# Patient Record
Sex: Male | Born: 1980 | Race: White | Hispanic: No | Marital: Married | Smoking: Current some day smoker
Health system: Southern US, Community
[De-identification: ages and names within clinical notes are randomized; demographics above are authoritative.]

## PROBLEM LIST (undated history)

## (undated) DIAGNOSIS — F419 Anxiety disorder, unspecified: Secondary | ICD-10-CM

## (undated) HISTORY — DX: Anxiety disorder, unspecified: F41.9

## (undated) HISTORY — PX: CARDIAC SURGERY: SHX584

---

## 2006-03-20 DIAGNOSIS — Z952 Presence of prosthetic heart valve: Secondary | ICD-10-CM

## 2006-03-20 HISTORY — DX: Presence of prosthetic heart valve: Z95.2

## 2019-01-06 ENCOUNTER — Ambulatory Visit: Payer: BC Managed Care – PPO | Admitting: Adult Health

## 2019-03-26 ENCOUNTER — Other Ambulatory Visit: Payer: Self-pay

## 2019-03-26 ENCOUNTER — Ambulatory Visit (INDEPENDENT_AMBULATORY_CARE_PROVIDER_SITE_OTHER): Payer: BC Managed Care – PPO | Admitting: Adult Health

## 2019-03-26 ENCOUNTER — Encounter: Payer: Self-pay | Admitting: Adult Health

## 2019-03-26 DIAGNOSIS — F422 Mixed obsessional thoughts and acts: Secondary | ICD-10-CM

## 2019-03-26 DIAGNOSIS — F331 Major depressive disorder, recurrent, moderate: Secondary | ICD-10-CM | POA: Diagnosis not present

## 2019-03-26 DIAGNOSIS — F410 Panic disorder [episodic paroxysmal anxiety] without agoraphobia: Secondary | ICD-10-CM | POA: Diagnosis not present

## 2019-03-26 DIAGNOSIS — F411 Generalized anxiety disorder: Secondary | ICD-10-CM | POA: Diagnosis not present

## 2019-03-26 MED ORDER — ALPRAZOLAM 1 MG PO TABS: 1.0000 mg | ORAL_TABLET | Freq: Three times a day (TID) | ORAL | 1 refills | Status: DC | PRN

## 2019-03-26 NOTE — Progress Notes (Signed)
[facility] MD/PA/NP Initial Note  03/26/2019 5:35 PM Chase Lee  MRN:  [id number]  Chief Complaint:  Chief Complaint    Anxiety; Depression; Panic Attack; Insomnia     HPI:  Describes mood today as "ok". Pleasant. Mood symptoms - reports some depression, anxiety, and irritability. Stating "I've had a lot of changes over the past few months". He and wife moved to [facility] from [place] with his job. It took wife 4 months to find a job. Increased worry and ruminations - "Lexapro has helped". Difficulties tolerating other SSRI's. Taking Lexapro 45m at bedtime. Has been taking Xanax since his twenties after 2 heart surgeries at age [age]and 89- "it seems to work well for me". Looking for a PCP and cardiololgist to manage his heart medications. Stable interest and motivation. Taking medications as prescribed.  Energy levels stable, Active, does not have a regular exercise routine. Works full-time. Enjoys some usual interests and activities. Married. Lives with wife of 7 years. Wife's family local. [place] time with family. Appetite adequate. Weight gain - 15 to 20 pounds since Covid-19.  Sleeps well most nights. Averages 6 to 8 hours. Focus and concentration stable. Completing tasks. Managing aspects of household. Work going well. Denies SI or HI. Denies AH or VH.  Previous medications: Prozac, Zoloft  Visit Diagnosis:    ICD-10-CM   1. Generalized anxiety disorder  F41.1 ALPRAZolam (XANAX) 1 MG tablet  2. Panic attacks  F41.0 ALPRAZolam (XANAX) 1 MG tablet  3. Major depressive disorder, recurrent episode, moderate (HCC)  F33.1   4. Mixed obsessional thoughts and acts  F42.2 ALPRAZolam (XANAX) 1 MG tablet    Past Psychiatric History: Denies psychiatric hospitalization.  Past Medical History:  Past Medical History:  Diagnosis Date  . Anxiety     Past Surgical History:  Procedure Laterality Date  . CARDIAC SURGERY      Family Psychiatric History: Mother with  depression.   Family History:  Family History  Problem Relation Age of Onset  . Depression Mother     Social History:  Social History   Socioeconomic History  . Marital status: Married    Spouse name: Not on file  . Number of children: 0  . Years of education: Not on file  . Highest education level: Not on file  Occupational History  . Occupation: SScientist, clinical (histocompatibility and immunogenetics) [employer]  Tobacco Use  . Smoking status: Current Some Day Smoker  . Smokeless tobacco: Never Used  Substance and Sexual Activity  . Alcohol use: Never  . Drug use: Never  . Sexual activity: Yes  Other Topics Concern  . Not on file  Social History Narrative  . Not on file   Social Determinants of Health   Financial Resource Strain:   . Difficulty of Paying Living Expenses: Not on file  Food Insecurity:   . Worried About RCharity fundraiserin the Last Year: Not on file  . Ran Out of Food in the Last Year: Not on file  Transportation Needs:   . Lack of Transportation (Medical): Not on file  . Lack of Transportation (Non-Medical): Not on file  Physical Activity:   . Days of Exercise per Week: Not on file  . Minutes of Exercise per Session: Not on file  Stress:   . Feeling of Stress : Not on file  Social Connections:   . Frequency of Communication with Friends and Family: Not on file  . Frequency of Social Gatherings  with Friends and Family: Not on file  . Attends Religious Services: Not on file  . Active Member of Clubs or Organizations: Not on file  . Attends Archivist Meetings: Not on file  . Marital Status: Not on file    Allergies: No Known Allergies  Metabolic Disorder Labs: No results found for: HGBA1C, MPG No results found for: PROLACTIN No results found for: CHOL, TRIG, HDL, CHOLHDL, VLDL, LDLCALC No results found for: TSH  Therapeutic Level Labs: No results found for: LITHIUM No results found for: VALPROATE No components found for:  CBMZ  Current Medications: Current  Outpatient Medications  Medication Sig Dispense Refill  . ALPRAZolam (XANAX) 1 MG tablet Take 1 tablet (1 mg total) by mouth 3 (three) times daily as needed for anxiety. 90 tablet 1  . furosemide (LASIX) 40 MG tablet TK 1 T PO D    . lisinopril (ZESTRIL) 20 MG tablet TK 1 T PO QD    . omeprazole (PRILOSEC) 40 MG capsule Take 40 mg by mouth daily.    Marland Kitchen warfarin (COUMADIN) 5 MG tablet      No current facility-administered medications for this visit.    Medication Side Effects: none  Orders placed this visit:  No orders of the defined types were placed in this encounter.   Psychiatric Specialty Exam:  Review of Systems  Musculoskeletal: Negative for gait problem.  Neurological: Negative for tremors.  Psychiatric/Behavioral:       Please refer to HPI    There were no vitals taken for this visit.There is no height or weight on file to calculate BMI.  General Appearance: Casual, Neat and Well Groomed  Eye Contact:  Good  Speech:  Clear and Coherent and Normal Rate  Volume:  Normal  Mood:  Anxious  Affect:  Appropriate and Congruent  Thought Process:  Coherent and Descriptions of Associations: Intact  Orientation:  Full (Time, Place, and Person)  Thought Content: Logical   Suicidal Thoughts:  No  Homicidal Thoughts:  No  Memory:  WNL  Judgement:  Good  Insight:  Good  Psychomotor Activity:  Normal  Concentration:  Concentration: Good  Recall:  Good  Fund of Knowledge: Good  Language: Good  Assets:  Communication Skills Desire for Improvement Financial Resources/Insurance Housing Intimacy Leisure Time Physical Health Resilience Social Support Talents/Skills Transportation Vocational/Educational  ADL's:  Intact  Cognition: WNL  Prognosis:  Good   Screenings: None  Receiving Psychotherapy: No   Treatment Plan/Recommendations:   Plan:  1. Lexapro 54m at hs 2. Continue Xanax 149mTID   RTC 4 weeks  Patient advised to contact office with any questions,  adverse effects, or acute worsening in signs and symptoms.    ReAloha GellNP

## 2019-04-17 ENCOUNTER — Other Ambulatory Visit: Payer: Self-pay

## 2019-04-17 ENCOUNTER — Encounter: Payer: Self-pay | Admitting: Adult Health

## 2019-04-17 ENCOUNTER — Ambulatory Visit (INDEPENDENT_AMBULATORY_CARE_PROVIDER_SITE_OTHER): Payer: BC Managed Care – PPO | Admitting: Adult Health

## 2019-04-17 DIAGNOSIS — F410 Panic disorder [episodic paroxysmal anxiety] without agoraphobia: Secondary | ICD-10-CM | POA: Diagnosis not present

## 2019-04-17 DIAGNOSIS — F422 Mixed obsessional thoughts and acts: Secondary | ICD-10-CM | POA: Diagnosis not present

## 2019-04-17 DIAGNOSIS — F411 Generalized anxiety disorder: Secondary | ICD-10-CM

## 2019-04-17 DIAGNOSIS — F331 Major depressive disorder, recurrent, moderate: Secondary | ICD-10-CM

## 2019-04-17 DIAGNOSIS — I10 Essential (primary) hypertension: Secondary | ICD-10-CM

## 2019-04-17 MED ORDER — ESCITALOPRAM OXALATE 10 MG PO TABS: 10.0000 mg | ORAL_TABLET | Freq: Every day | ORAL | 5 refills | Status: DC

## 2019-04-17 MED ORDER — ALPRAZOLAM 1 MG PO TABS: 1.0000 mg | ORAL_TABLET | Freq: Three times a day (TID) | ORAL | 2 refills | Status: DC | PRN

## 2019-04-17 MED ORDER — LISINOPRIL 20 MG PO TABS: 20.0000 mg | ORAL_TABLET | Freq: Every day | ORAL | 2 refills | Status: DC

## 2019-04-17 NOTE — Progress Notes (Signed)
Chase Lee [id number] 1981/01/22 39 y.o.  Subjective:   Patient ID:  Chase Lee is a 39 y.o. (DOB [birth year]) male.  Chief Complaint: No chief complaint on file.   HPI Chase Lee presents to the office today for follow-up of OCD, MDD, panic attacks, and GAD.   Describes mood today as "ok". Pleasant. Mood symptoms - reports decreased depression, anxiety, and irritability. More anxious overall. Stating "I'm doing better now that I have my medications". Plans to start a new job with [employer]. He and wife recently moved to [facility] from [place]. Decreased worry and ruminations since restarting Lexapro. Looking for a PCP and cardiololgist to manage his heart medications - out of Lisinopril. Stable interest and motivation. Taking medications as prescribed.  Energy levels stable. Active, does not have a regular exercise routine. Works full-time. Enjoys some usual interests and activities. Married. Lives with wife of 7 years. Family in [place]. Spending time with family. Appetite adequate. Weight gain - 5 pounds - 233.  Sleeps well most nights. Averages 6 to 8 hours. Focus and concentration stable. Completing tasks. Managing aspects of household. Changing jobs in a week.  Denies SI or HI. Denies AH or VH.  Previous medications: Prozac, Zoloft  Review of Systems:  Review of Systems  Musculoskeletal: Negative for gait problem.  Neurological: Negative for tremors.  Psychiatric/Behavioral:       Please refer to HPI    Medications: I have reviewed the patient's current medications.  Current Outpatient Medications  Medication Sig Dispense Refill  . ALPRAZolam (XANAX) 1 MG tablet Take 1 tablet (1 mg total) by mouth 3 (three) times daily as needed for anxiety. 90 tablet 2  . escitalopram (LEXAPRO) 10 MG tablet Take 1 tablet (10 mg total) by mouth daily. 30 tablet 5  . furosemide (LASIX) 40 MG tablet TK 1 T PO D    . lisinopril (ZESTRIL) 20 MG tablet TK 1 T PO QD    . lisinopril (ZESTRIL) 20  MG tablet Take 1 tablet (20 mg total) by mouth daily. 30 tablet 2  . omeprazole (PRILOSEC) 40 MG capsule Take 40 mg by mouth daily.    Marland Kitchen warfarin (COUMADIN) 5 MG tablet      No current facility-administered medications for this visit.    Medication Side Effects: None  Allergies: No Known Allergies  Past Medical History:  Diagnosis Date  . Anxiety     Family History  Problem Relation Age of Onset  . Depression Mother     Social History   Socioeconomic History  . Marital status: Married    Spouse name: Not on file  . Number of children: 0  . Years of education: Not on file  . Highest education level: Not on file  Occupational History  . Occupation: Scientist, clinical (histocompatibility and immunogenetics): [employer]  Tobacco Use  . Smoking status: Current Some Day Smoker  . Smokeless tobacco: Never Used  Substance and Sexual Activity  . Alcohol use: Never  . Drug use: Never  . Sexual activity: Yes  Other Topics Concern  . Not on file  Social History Narrative  . Not on file   Social Determinants of Health   Financial Resource Strain:   . Difficulty of Paying Living Expenses: Not on file  Food Insecurity:   . Worried About Charity fundraiser in the Last Year: Not on file  . Ran Out of Food in the Last Year: Not on file  Transportation Needs:   . Lack of Transportation (  Medical): Not on file  . Lack of Transportation (Non-Medical): Not on file  Physical Activity:   . Days of Exercise per Week: Not on file  . Minutes of Exercise per Session: Not on file  Stress:   . Feeling of Stress : Not on file  Social Connections:   . Frequency of Communication with Friends and Family: Not on file  . Frequency of Social Gatherings with Friends and Family: Not on file  . Attends Religious Services: Not on file  . Active Member of Clubs or Organizations: Not on file  . Attends Archivist Meetings: Not on file  . Marital Status: Not on file  Intimate Partner Violence:   . Fear of Current or  Ex-Partner: Not on file  . Emotionally Abused: Not on file  . Physically Abused: Not on file  . Sexually Abused: Not on file    Past Medical History, Surgical history, Social history, and Family history were reviewed and updated as appropriate.   Please see review of systems for further details on the patient's review from today.   Objective:   Physical Exam:  There were no vitals taken for this visit.  Physical Exam Constitutional:      General: He is not in acute distress.    Appearance: He is well-developed.  Musculoskeletal:        General: No deformity.  Neurological:     Mental Status: He is alert and oriented to person, place, and time.     Coordination: Coordination normal.  Psychiatric:        Attention and Perception: Attention and perception normal. He does not perceive auditory or visual hallucinations.        Mood and Affect: Mood normal. Mood is not anxious or depressed. Affect is not labile, blunt, angry or inappropriate.        Speech: Speech normal.        Behavior: Behavior normal.        Thought Content: Thought content normal. Thought content is not paranoid or delusional. Thought content does not include homicidal or suicidal ideation. Thought content does not include homicidal or suicidal plan.        Cognition and Memory: Cognition and memory normal.        Judgment: Judgment normal.     Comments: Insight intact     Lab Review:  No results found for: NA, K, CL, CO2, GLUCOSE, BUN, CREATININE, CALCIUM, PROT, ALBUMIN, AST, ALT, ALKPHOS, BILITOT, GFRNONAA, GFRAA  No results found for: WBC, RBC, HGB, HCT, PLT, MCV, MCH, MCHC, RDW, LYMPHSABS, MONOABS, EOSABS, BASOSABS  No results found for: POCLITH, LITHIUM   No results found for: PHENYTOIN, PHENOBARB, VALPROATE, CBMZ   .res Assessment: Plan:     Plan:  1. Lexapro 106m at hs 2. Continue Xanax 124mTID  Will send in Lisinopril until finds a PCP  RTC 3 months  Patient advised to contact office  with any questions, adverse effects, or acute worsening in signs and symptoms.  Discussed potential benefits, risk, and side effects of benzodiazepines to include potential risk of tolerance and dependence, as well as possible drowsiness.  Advised patient not to drive if experiencing drowsiness and to take lowest possible effective dose to minimize risk of dependence and tolerance.  Diagnoses and all orders for this visit:  Mixed obsessional thoughts and acts -     escitalopram (LEXAPRO) 10 MG tablet; Take 1 tablet (10 mg total) by mouth daily. -     ALPRAZolam (  XANAX) 1 MG tablet; Take 1 tablet (1 mg total) by mouth 3 (three) times daily as needed for anxiety.  Major depressive disorder, recurrent episode, moderate (HCC) -     escitalopram (LEXAPRO) 10 MG tablet; Take 1 tablet (10 mg total) by mouth daily.  Panic attacks -     escitalopram (LEXAPRO) 10 MG tablet; Take 1 tablet (10 mg total) by mouth daily. -     ALPRAZolam (XANAX) 1 MG tablet; Take 1 tablet (1 mg total) by mouth 3 (three) times daily as needed for anxiety.  Generalized anxiety disorder -     escitalopram (LEXAPRO) 10 MG tablet; Take 1 tablet (10 mg total) by mouth daily. -     ALPRAZolam (XANAX) 1 MG tablet; Take 1 tablet (1 mg total) by mouth 3 (three) times daily as needed for anxiety.  Hypertension, unspecified type  Other orders -     lisinopril (ZESTRIL) 20 MG tablet; Take 1 tablet (20 mg total) by mouth daily.     Please see After Visit Summary for patient specific instructions.  No future appointments.  No orders of the defined types were placed in this encounter.   -------------------------------

## 2019-04-23 ENCOUNTER — Ambulatory Visit: Payer: BC Managed Care – PPO | Admitting: Adult Health

## 2019-04-28 ENCOUNTER — Ambulatory Visit
Admission: EM | Admit: 2019-04-28 | Discharge: 2019-04-28 | Disposition: A | Discharge status: Home / Self Care | Payer: Self-pay | Attending: Emergency Medicine | Admitting: Emergency Medicine

## 2019-04-28 ENCOUNTER — Other Ambulatory Visit: Payer: Self-pay

## 2019-04-28 DIAGNOSIS — Z760 Encounter for issue of repeat prescription: Secondary | ICD-10-CM

## 2019-04-28 MED ORDER — OMEPRAZOLE 40 MG PO CPDR: 40.0000 mg | DELAYED_RELEASE_CAPSULE | Freq: Every day | ORAL | 0 refills | Status: DC

## 2019-04-28 NOTE — ED Triage Notes (Signed)
Pt is in need of refills for lisinopril and coumadin. Pt is in need of new PCP

## 2019-04-28 NOTE — ED Provider Notes (Signed)
[facility]    CSN: [id number] Arrival date & time: 04/28/19  1431      History   Chief Complaint Chief Complaint  Patient presents with  . Medication Refill    HPI Chase Lee is a 39 y.o. male.   who presents for blood pressure medication refill.  No known history of hypertension.  Patient states he has a valve replacement and therefore he was put on a blood pressure medication.  States blood pressure on average is 130/80.  Takes lisinopril daily.  Does not have a PCP.  Denies HA, vision changes, dizziness, lightheadedness, chest pain, shortness of breath, numbness or tingling in extremities, abdominal pain, changes in bowel or bladder habits.  He also want omeprazole to be refilled   The history is provided by the patient. No language interpreter was used.  Medication Refill   Past Medical History:  Diagnosis Date  . Anxiety   . History of artificial heart valve 2008   surgery at 25    There are no problems to display for this patient.   Past Surgical History:  Procedure Laterality Date  . CARDIAC SURGERY         Home Medications    Prior to Admission medications   Medication Sig Start Date End Date Taking? Authorizing Provider  ALPRAZolam Duanne Moron) 1 MG tablet Take 1 tablet (1 mg total) by mouth 3 (three) times daily as needed for anxiety. 04/17/19   Mozingo, Berdie Ogren, NP  escitalopram (LEXAPRO) 10 MG tablet Take 1 tablet (10 mg total) by mouth daily. 04/17/19   Mozingo, Berdie Ogren, NP  furosemide (LASIX) 40 MG tablet TK 1 T PO D 01/04/19   [provider]  lisinopril (ZESTRIL) 20 MG tablet TK 1 T PO QD 11/06/18   [provider]  lisinopril (ZESTRIL) 20 MG tablet Take 1 tablet (20 mg total) by mouth daily. 04/17/19   Mozingo, Berdie Ogren, NP  omeprazole (PRILOSEC) 40 MG capsule Take 1 capsule (40 mg total) by mouth daily. 04/28/19   Emerson Monte, FNP  warfarin (COUMADIN) 5 MG tablet  12/23/18   [provider]    Family History Family History  Problem Relation Age of Onset  . Depression Mother   . Cancer Mother   . Cancer Father     Social History Social History   Tobacco Use  . Smoking status: Current Some Day Smoker  . Smokeless tobacco: Never Used  Substance Use Topics  . Alcohol use: Never  . Drug use: Never     Allergies   Patient has no known allergies.   Review of Systems Review of Systems  Constitutional: Negative.   HENT: Negative.   Cardiovascular: Negative.   All other systems reviewed and are negative.    Physical Exam Triage Vital Signs ED Triage Vitals  Enc Vitals Group     BP 04/28/19 1454 120/79     Pulse Rate 04/28/19 1454 64     Resp 04/28/19 1454 18     Temp 04/28/19 1454 99.2 F (37.3 C)     Temp src --      SpO2 04/28/19 1454 95 %     Weight --      Height --      Head Circumference --      Peak Flow --      Pain Score 04/28/19 1449 0     Pain Loc --      Pain Edu? --  Excl. in GC? --    No data found.  Updated Vital Signs BP 120/79   Pulse 64   Temp 99.2 F (37.3 C)   Resp 18   SpO2 95%   Visual Acuity Right Eye Distance:   Left Eye Distance:   Bilateral Distance:    Right Eye Near:   Left Eye Near:    Bilateral Near:     Physical Exam Constitutional:      General: He is not in acute distress.    Appearance: Normal appearance. He is normal weight. He is not toxic-appearing or diaphoretic.  HENT:     Head: Normocephalic and atraumatic.     Nose: Nose normal. No congestion.     Mouth/Throat:     Mouth: Mucous membranes are moist.     Pharynx: Oropharynx is clear.  Cardiovascular:     Rate and Rhythm: Normal rate and regular rhythm.     Pulses: Normal pulses.     Heart sounds: Normal heart sounds.  Pulmonary:     Effort: Pulmonary effort is normal. No respiratory distress.     Breath sounds: Normal breath sounds. No wheezing.  Chest:     Chest wall: No tenderness.  Neurological:     Mental Status: He is  alert.      UC Treatments / Results  Labs (all labs ordered are listed, but only abnormal results are displayed) Labs Reviewed - No data to display  EKG   Radiology No results found.  Procedures Procedures (including critical care time)  Medications Ordered in UC Medications - No data to display  Initial Impression / Assessment and Plan / UC Course  I have reviewed the triage vital signs and the nursing notes.  Pertinent labs & imaging results that were available during my care of the patient were reviewed by me and considered in my medical decision making (see chart for details).   Medication  Refill Advised patient to take medication as prescribed. Primary care resource was provided, please call. Omeprazole was refilled today visit  Final Clinical Impressions(s) / UC Diagnoses   Final diagnoses:  Medication refill     Discharge Instructions     Lisinopril refill is not needed Omeprazole was refilled today Please call the above number to set up an appointment with primary care To return for worsening of symptoms    ED Prescriptions    Medication Sig Dispense Auth. Provider   omeprazole (PRILOSEC) 40 MG capsule Take 1 capsule (40 mg total) by mouth daily. 60 capsule Nasri Boakye, Darrelyn Hillock, FNP     PDMP not reviewed this encounter.   Emerson Monte, FNP 04/28/19 1536

## 2019-04-28 NOTE — Discharge Instructions (Addendum)
Lisinopril refill is not needed Omeprazole was refilled today Please call the above number to set up an appointment with primary care To return for worsening of symptoms

## 2019-06-18 ENCOUNTER — Telehealth: Payer: Self-pay | Admitting: Adult Health

## 2019-06-18 ENCOUNTER — Other Ambulatory Visit: Payer: Self-pay | Admitting: Adult Health

## 2019-06-18 DIAGNOSIS — F410 Panic disorder [episodic paroxysmal anxiety] without agoraphobia: Secondary | ICD-10-CM

## 2019-06-18 DIAGNOSIS — F422 Mixed obsessional thoughts and acts: Secondary | ICD-10-CM

## 2019-06-18 DIAGNOSIS — F411 Generalized anxiety disorder: Secondary | ICD-10-CM

## 2019-06-18 MED ORDER — ALPRAZOLAM 1 MG PO TABS: 1.0000 mg | ORAL_TABLET | Freq: Three times a day (TID) | ORAL | 2 refills | Status: DC | PRN

## 2019-06-18 NOTE — Telephone Encounter (Signed)
Pt would like a early refill on his xanax. Pt is going out of town and he will run out. He said that he does not know what happened why he is completely out. Please send to [facility] on [address] in [place].

## 2019-06-18 NOTE — Telephone Encounter (Signed)
Picked up on 03/03. Should be able to get it today. Refill sent.

## 2019-07-14 ENCOUNTER — Encounter: Payer: Self-pay | Admitting: Adult Health

## 2019-07-14 ENCOUNTER — Other Ambulatory Visit: Payer: Self-pay

## 2019-07-14 ENCOUNTER — Ambulatory Visit (INDEPENDENT_AMBULATORY_CARE_PROVIDER_SITE_OTHER): Payer: BC Managed Care – PPO | Admitting: Adult Health

## 2019-07-14 DIAGNOSIS — F422 Mixed obsessional thoughts and acts: Secondary | ICD-10-CM

## 2019-07-14 DIAGNOSIS — F331 Major depressive disorder, recurrent, moderate: Secondary | ICD-10-CM

## 2019-07-14 DIAGNOSIS — F410 Panic disorder [episodic paroxysmal anxiety] without agoraphobia: Secondary | ICD-10-CM

## 2019-07-14 DIAGNOSIS — F411 Generalized anxiety disorder: Secondary | ICD-10-CM

## 2019-07-14 MED ORDER — ESCITALOPRAM OXALATE 10 MG PO TABS: 10.0000 mg | ORAL_TABLET | Freq: Every day | ORAL | 5 refills | Status: DC

## 2019-07-14 MED ORDER — ALPRAZOLAM 1 MG PO TABS: 1.0000 mg | ORAL_TABLET | Freq: Three times a day (TID) | ORAL | 2 refills | Status: DC | PRN

## 2019-07-14 NOTE — Progress Notes (Signed)
Chase Lee [id number] 1980-05-16 39 y.o.  Subjective:   Patient ID:  Chase Lee is a 39 y.o. (DOB 1980-07-04) male.  Chief Complaint: No chief complaint on file.   HPI Taiyo Kozma presents to the office today for follow-up of OCD, MDD, panic attacks, and GAD.   Describes mood today as "ok". Pleasant. Mood symptoms - reports decreased depression, anxiety, and irritability. Increased anxiety "at times". Stating "I'm doing alright for the most part". New jobs going well - working out of town. Wife doing well. Stable interest and motivation. Taking medications as prescribed.  Energy levels stable. Active, does not have a regular exercise routine. Works 2 part time job - [employer] at [employer] Enjoys some usual interests and activities. Married. Lives with wife of 7 years. Family in [place]. Spending time with family. Appetite adequate. Weight loss - 10 pounds - walking at work.  Sleeps well most nights. Averages 6 to 8 hours. Focus and concentration stable. Completing tasks. Managing aspects of household. Work going well.  Denies SI or HI. Denies AH or VH.  Previous medications: Prozac, Zoloft  Review of Systems:  Review of Systems  Musculoskeletal: Negative for gait problem.  Neurological: Negative for tremors.  Psychiatric/Behavioral:       Please refer to HPI    Medications: I have reviewed the patient's current medications.  Current Outpatient Medications  Medication Sig Dispense Refill  . ALPRAZolam (XANAX) 1 MG tablet Take 1 tablet (1 mg total) by mouth 3 (three) times daily as needed for anxiety. 90 tablet 2  . escitalopram (LEXAPRO) 10 MG tablet Take 1 tablet (10 mg total) by mouth daily. 30 tablet 5  . furosemide (LASIX) 40 MG tablet TK 1 T PO D    . lisinopril (ZESTRIL) 20 MG tablet TK 1 T PO QD    . lisinopril (ZESTRIL) 20 MG tablet Take 1 tablet (20 mg total) by mouth daily. 30 tablet 2  . omeprazole (PRILOSEC) 40 MG capsule Take 1 capsule (40 mg total)  by mouth daily. 60 capsule 0  . warfarin (COUMADIN) 5 MG tablet      No current facility-administered medications for this visit.    Medication Side Effects: None  Allergies: No Known Allergies  Past Medical History:  Diagnosis Date  . Anxiety   . History of artificial heart valve 2008   surgery at 13    Family History  Problem Relation Age of Onset  . Depression Mother   . Cancer Mother   . Cancer Father     Social History   Socioeconomic History  . Marital status: Married    Spouse name: Not on file  . Number of children: 0  . Years of education: Not on file  . Highest education level: Not on file  Occupational History  . Occupation: Scientist, clinical (histocompatibility and immunogenetics): [employer]  Tobacco Use  . Smoking status: Current Some Day Smoker  . Smokeless tobacco: Never Used  Substance and Sexual Activity  . Alcohol use: Never  . Drug use: Never  . Sexual activity: Yes  Other Topics Concern  . Not on file  Social History Narrative  . Not on file   Social Determinants of Health   Financial Resource Strain:   . Difficulty of Paying Living Expenses:   Food Insecurity:   . Worried About Charity fundraiser in the Last Year:   . Arboriculturist in the Last Year:   Transportation Needs:   .  Lack of Transportation (Medical):   Marland Kitchen Lack of Transportation (Non-Medical):   Physical Activity:   . Days of Exercise per Week:   . Minutes of Exercise per Session:   Stress:   . Feeling of Stress :   Social Connections:   . Frequency of Communication with Friends and Family:   . Frequency of Social Gatherings with Friends and Family:   . Attends Religious Services:   . Active Member of Clubs or Organizations:   . Attends Archivist Meetings:   Marland Kitchen Marital Status:   Intimate Partner Violence:   . Fear of Current or Ex-Partner:   . Emotionally Abused:   Marland Kitchen Physically Abused:   . Sexually Abused:     Past Medical History, Surgical history, Social history, and Family history were  reviewed and updated as appropriate.   Please see review of systems for further details on the patient's review from today.   Objective:   Physical Exam:  There were no vitals taken for this visit.  Physical Exam Constitutional:      General: He is not in acute distress. Musculoskeletal:        General: No deformity.  Neurological:     Mental Status: He is alert and oriented to person, place, and time.     Coordination: Coordination normal.  Psychiatric:        Attention and Perception: Attention and perception normal. He does not perceive auditory or visual hallucinations.        Mood and Affect: Mood is anxious. Mood is not depressed. Affect is not labile, blunt, angry or inappropriate.        Speech: Speech normal.        Behavior: Behavior normal.        Thought Content: Thought content normal. Thought content is not paranoid or delusional. Thought content does not include homicidal or suicidal ideation. Thought content does not include homicidal or suicidal plan.        Cognition and Memory: Cognition and memory normal.        Judgment: Judgment normal.     Comments: Insight intact    Lab Review:  No results found for: NA, K, CL, CO2, GLUCOSE, BUN, CREATININE, CALCIUM, PROT, ALBUMIN, AST, ALT, ALKPHOS, BILITOT, GFRNONAA, GFRAA  No results found for: WBC, RBC, HGB, HCT, PLT, MCV, MCH, MCHC, RDW, LYMPHSABS, MONOABS, EOSABS, BASOSABS  No results found for: POCLITH, LITHIUM   No results found for: PHENYTOIN, PHENOBARB, VALPROATE, CBMZ   .res Assessment: Plan:    Plan:  1. Lexapro 66m at hs 2. Continue Xanax 148mTID  RTC 3 months  Patient advised to contact office with any questions, adverse effects, or acute worsening in signs and symptoms.  Discussed potential benefits, risk, and side effects of benzodiazepines to include potential risk of tolerance and dependence, as well as possible drowsiness.  Advised patient not to drive if experiencing drowsiness and to take  lowest possible effective dose to minimize risk of dependence and tolerance.   There are no diagnoses linked to this encounter.   Please see After Visit Summary for patient specific instructions.  No future appointments.  No orders of the defined types were placed in this encounter.   -------------------------------

## 2019-08-11 ENCOUNTER — Telehealth: Payer: Self-pay | Admitting: Adult Health

## 2019-08-11 NOTE — Telephone Encounter (Signed)
Pt states that he is out early of his Xanax, and Lexapro one day early. He is out of meds one day early but doesn't know how that happened. Has had wife keeping his meds. Pharmacy says not able to fill until May 26.  [facility] in [place]

## 2019-08-11 NOTE — Telephone Encounter (Signed)
Noted  

## 2019-08-12 ENCOUNTER — Encounter (HOSPITAL_COMMUNITY): Payer: Self-pay | Admitting: Emergency Medicine

## 2019-08-12 ENCOUNTER — Emergency Department (HOSPITAL_COMMUNITY): Payer: Self-pay

## 2019-08-12 ENCOUNTER — Other Ambulatory Visit: Payer: Self-pay

## 2019-08-12 ENCOUNTER — Emergency Department (HOSPITAL_COMMUNITY)
Admission: EM | Admit: 2019-08-12 | Discharge: 2019-08-12 | Disposition: A | Discharge status: Home / Self Care | Payer: Self-pay | Attending: Emergency Medicine | Admitting: Emergency Medicine

## 2019-08-12 DIAGNOSIS — F419 Anxiety disorder, unspecified: Secondary | ICD-10-CM | POA: Insufficient documentation

## 2019-08-12 DIAGNOSIS — R61 Generalized hyperhidrosis: Secondary | ICD-10-CM | POA: Insufficient documentation

## 2019-08-12 DIAGNOSIS — I10 Essential (primary) hypertension: Secondary | ICD-10-CM | POA: Insufficient documentation

## 2019-08-12 DIAGNOSIS — R0789 Other chest pain: Secondary | ICD-10-CM | POA: Insufficient documentation

## 2019-08-12 DIAGNOSIS — Z79899 Other long term (current) drug therapy: Secondary | ICD-10-CM | POA: Insufficient documentation

## 2019-08-12 DIAGNOSIS — Z7901 Long term (current) use of anticoagulants: Secondary | ICD-10-CM | POA: Insufficient documentation

## 2019-08-12 DIAGNOSIS — Z87891 Personal history of nicotine dependence: Secondary | ICD-10-CM | POA: Insufficient documentation

## 2019-08-12 DIAGNOSIS — R110 Nausea: Secondary | ICD-10-CM | POA: Insufficient documentation

## 2019-08-12 DIAGNOSIS — R202 Paresthesia of skin: Secondary | ICD-10-CM | POA: Insufficient documentation

## 2019-08-12 DIAGNOSIS — R739 Hyperglycemia, unspecified: Secondary | ICD-10-CM | POA: Insufficient documentation

## 2019-08-12 DIAGNOSIS — R0602 Shortness of breath: Secondary | ICD-10-CM | POA: Insufficient documentation

## 2019-08-12 LAB — BASIC METABOLIC PANEL
Anion gap: 11 (ref 5–15)
BUN: 11 mg/dL (ref 6–20)
CO2: 27 mmol/L (ref 22–32)
Calcium: 8.8 mg/dL — ABNORMAL LOW (ref 8.9–10.3)
Chloride: 98 mmol/L (ref 98–111)
Creatinine, Ser: 0.86 mg/dL (ref 0.61–1.24)
GFR calc Af Amer: >60 mL/min (ref >60)
GFR calc non Af Amer: >60 mL/min (ref >60)
Glucose, Bld: 179 mg/dL — ABNORMAL HIGH (ref 70–99)
Potassium: 3.5 mmol/L (ref 3.5–5.1)
Sodium: 136 mmol/L (ref 135–145)

## 2019-08-12 LAB — TROPONIN I (HIGH SENSITIVITY)
Troponin I (High Sensitivity): 7 ng/L (ref <18)
Troponin I (High Sensitivity): 7 ng/L (ref <18)

## 2019-08-12 LAB — CBC
HCT: 43.2 % (ref 39.0–52.0)
Hemoglobin: 14.2 g/dL (ref 13.0–17.0)
MCH: 29.1 pg (ref 26.0–34.0)
MCHC: 32.9 g/dL (ref 30.0–36.0)
MCV: 88.5 fL (ref 80.0–100.0)
Platelets: 249 10*3/uL (ref 150–400)
RBC: 4.88 MIL/uL (ref 4.22–5.81)
RDW: 13.5 % (ref 11.5–15.5)
WBC: 11.4 10*3/uL — ABNORMAL HIGH (ref 4.0–10.5)
nRBC: 0 % (ref 0.0–0.2)

## 2019-08-12 LAB — PROTIME-INR
INR: 2.1 — ABNORMAL HIGH (ref 0.8–1.2)
Prothrombin Time: 22.5 seconds — ABNORMAL HIGH (ref 11.4–15.2)

## 2019-08-12 LAB — CBG MONITORING, ED: Glucose-Capillary: 187 mg/dL — ABNORMAL HIGH (ref 70–99)

## 2019-08-12 MED ORDER — ALPRAZOLAM 0.5 MG PO TABS
0.5000 mg | ORAL_TABLET | Freq: Once | ORAL | Status: AC
  Administered 2019-08-12: 0.5 mg via ORAL
  Filled 2019-08-12: qty 1

## 2019-08-12 MED ORDER — ALPRAZOLAM 0.5 MG PO TABS
1.0000 mg | ORAL_TABLET | Freq: Once | ORAL | Status: AC
  Administered 2019-08-12: 1 mg via ORAL
  Filled 2019-08-12: qty 2

## 2019-08-12 NOTE — ED Provider Notes (Signed)
[facility] EMERGENCY DEPARTMENT Provider Note   CSN: [id number] Arrival date & time: 08/12/19  2778     History Chief Complaint  Patient presents with  . Chest Pain    Chase Lee is a 39 y.o. 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  He also endorses being able to hear his valve clicking louder than normal.  He has had no fevers or chills, denies palpitations, he did have some nausea and diaphoresis last night which has resolved.  He does endorse also having increased anxiety.  He is under the care of a psychiatrist in [place] and has run out of his Xanax, has been splitting his 1 mg tablets to try and make it last, last dose was taken yesterday.  He states his symptoms are somewhat suggestive of increased anxiety but the sensation in his chest is different than a normal panic attack.  He does have some mild tingling in his toes at this time.  He denies pleuritic chest pain, has had no cough, no fevers or chills.  Denies IV drug use.  He is on Coumadin and denies any missed doses but it has been a long time since he is had his INR checked.  He has found no alleviators for symptoms, but states he feels significantly improved proved since arriving here.  He has contacted his psychiatrist and will be able to pick up his Xanax refill tomorrow.  He has been taking additional Xanax in attempt to help him with his attempt at smoking cessation.  Other cardiac risk factors include hypertension.  No family risk factors.  HPI  HPI: A 39 year old patient with a history of hypertension and obesity presents for evaluation of chest pain. Initial onset of pain was more than 6 hours ago. The patient's chest pain is described as heaviness/pressure/tightness and is not worse with exertion.  The patient complains of nausea and reports some diaphoresis. The patient's chest pain is middle- or left-sided, is not well-localized, is not sharp and does not radiate to the arms/jaw/neck. The patient has smoked in the past 90 days. The patient has no history of stroke, has no history of peripheral artery disease, denies any history of treated diabetes, has no relevant family history of coronary artery disease (first degree relative at less than age 37) and has no history of hypercholesterolemia.   Past Medical History:  Diagnosis Date  . Anxiety   . History of artificial heart valve 2008   surgery at 25    There are no problems to display for this patient.   Past Surgical History:  Procedure Laterality Date  . CARDIAC SURGERY         Family History  Problem Relation Age of Onset  . Depression Mother   . Cancer Mother   . Cancer Father     Social History   Tobacco Use  . Smoking status: Former Smoker    Quit date: 08/10/2019  . Smokeless tobacco: Never Used  Substance Use Topics  . Alcohol use: Never  . Drug use: Never    Home Medications Prior to Admission medications   Medication Sig Start Date End Date Taking? Authorizing Provider  ALPRAZolam Duanne Moron) 1 MG tablet Take 1 tablet (1 mg total) by mouth 3 (three) times daily as needed for anxiety. 07/14/19  Yes Mozingo, Berdie Ogren, NP  Buprenorphine HCl-Naloxone HCl 8-2 MG FILM Take 1 Film by mouth daily.    Yes [provider]  escitalopram (LEXAPRO) 10 MG tablet Take 1 tablet (10 mg total) by mouth daily. 07/14/19  Yes Mozingo, Berdie Ogren, NP  furosemide (LASIX) 40 MG tablet TK 1 T PO D 01/04/19  Yes [provider]  lisinopril (ZESTRIL) 20 MG tablet TK 1 T PO QD 11/06/18  Yes [provider]  lisinopril (ZESTRIL) 20 MG tablet Take 1 tablet (20 mg total) by mouth daily. 04/17/19  Yes Mozingo, Berdie Ogren, NP  omeprazole (PRILOSEC) 40 MG capsule Take 1 capsule (40 mg total) by mouth  daily. 04/28/19  Yes Avegno, Darrelyn Hillock, FNP  warfarin (COUMADIN) 5 MG tablet Take 5 mg by mouth. 56m mon-sat. Doesn't take on sundays. 12/23/18  Yes [provider]    Allergies    Patient has no known allergies.  Review of Systems   Review of Systems  Constitutional: Positive for diaphoresis. Negative for fever.  HENT: Negative for congestion.   Eyes: Negative.   Respiratory: Positive for chest tightness and shortness of breath.   Cardiovascular: Negative for chest pain.  Gastrointestinal: Positive for nausea. Negative for abdominal pain.  Genitourinary: Negative.   Musculoskeletal: Negative.   Skin: Negative.  Negative for rash and wound.  Neurological: Negative for dizziness, weakness, light-headedness, numbness and headaches.  Psychiatric/Behavioral: Negative.     Physical Exam Updated Vital Signs BP (!) 101/56   Pulse (!) 53   Resp (!) 24   Ht 5' 7"  (1.702 m)   Wt 111.1 kg   SpO2 96%   BMI 38.37 kg/m   Physical Exam Vitals and nursing note reviewed.  Constitutional:      Appearance: He is well-developed.  HENT:     Head: Normocephalic and atraumatic.  Eyes:     Conjunctiva/sclera: Conjunctivae normal.  Cardiovascular:     Rate and Rhythm: Normal rate and regular rhythm.     Heart sounds: Normal heart sounds. No murmur.     Comments: Mechanical valve appreciated LUSB. No murmer Pulmonary:     Effort: Pulmonary effort is normal.     Breath sounds: Normal breath sounds. No decreased breath sounds, wheezing or rhonchi.  Abdominal:     General: Bowel sounds are normal.     Palpations: Abdomen is soft.     Tenderness: There is no abdominal tenderness.  Musculoskeletal:        General: Normal range of motion.     Cervical back: Normal range of motion.  Skin:    General: Skin is warm and dry.  Neurological:     Mental Status: He is alert.     ED Results / Procedures / Treatments   Labs (all labs ordered are listed, but only abnormal results are  displayed) Labs Reviewed  BASIC METABOLIC PANEL - Abnormal; Notable for the following components:      Result Value   Glucose, Bld 179 (*)    Calcium 8.8 (*)    All other components within normal limits  CBC - Abnormal; Notable for the following components:   WBC 11.4 (*)    All other components within normal limits  PROTIME-INR - Abnormal; Notable for the following components:   Prothrombin Time 22.5 (*)    INR 2.1 (*)    All other components within normal limits  CBG MONITORING, ED - Abnormal; Notable for the following components:   Glucose-Capillary 187 (*)  All other components within normal limits  TROPONIN I (HIGH SENSITIVITY)  TROPONIN I (HIGH SENSITIVITY)    EKG EKG Interpretation  Date/Time:  Tuesday Aug 12 2019 07:30:47 EDT Ventricular Rate:  65 PR Interval:    QRS Duration: 108 QT Interval:  439 QTC Calculation: 457 R Axis:   74 Text Interpretation: Sinus rhythm Possible Ventricular preexcitation(WPW) Confirmed by Elnora Morrison [id number]) on 08/12/2019 8:39:20 AM   Radiology DG Chest Portable 1 View  Result Date: 08/12/2019 CLINICAL DATA:  Left-sided chest pain. EXAM: PORTABLE CHEST 1 VIEW COMPARISON:  None. FINDINGS: There are changes from previous cardiac surgery and valve replacement. The cardiac silhouette is normal in size and configuration. No mediastinal or hilar masses. No evidence of adenopathy. Clear lungs.  No pleural effusion or pneumothorax. Skeletal structures are grossly intact. IMPRESSION: No active disease. Electronically Signed   By: Lajean Manes M.D.   On: 08/12/2019 08:31    Procedures Procedures (including critical care time)  Medications Ordered in ED Medications  ALPRAZolam Duanne Moron) tablet 1 mg (1 mg Oral Given 08/12/19 4462)    ED Course  I have reviewed the triage vital signs and the nursing notes.  Pertinent labs & imaging results that were available during my care of the patient were reviewed by me and considered in my medical  decision making (see chart for details).    MDM Rules/Calculators/A&P HEAR Score: 4                    Pt with a heart score of 4 with negative delta troponins who had resolution of his sx after receiving a dose of xanax.  Favor sx driven by anxiety and being out of his xanax medication.  Pt concurs he suspects panic attack as source of sx.  His coumadin is therapeutic at 2.1  .  His glucose is elevated at 187 which was discussed with pt.  He endorses family hx of DM, advised will need close f/u of this.  He had 2 small sausage biscuits and drank a soda prior to arrival.    Referrals given for f/u care including pcp and cardiology to establish ongoing cardiac care.  He will be able to get his xanax refill tomorrow.  Final Clinical Impression(s) / ED Diagnoses Final diagnoses:  Atypical chest pain  Anxiety  Hyperglycemia    Rx / DC Orders ED Discharge Orders    None       Landis Martins 08/12/19 1048    Elnora Morrison, MD 08/12/19 1125

## 2019-08-12 NOTE — ED Triage Notes (Signed)
PT c/o left sided chest pain and states he feels like his artificial heart valve is clicking louder and it started last night. PT states SOB with exertion this am. PT states he is trying to quite smoking and has been using his xanax that is prescribed to him and has ran out of them.

## 2019-08-12 NOTE — Discharge Instructions (Addendum)
As discussed, your lab tests, chest xray and ekg are reassuring today.  Your coumadin level is therapeutic with an INR of 2.1.  Your blood glucose is elevated at 187 and will need a close recheck of this - see the resources listed for establishing a primary MD.  In the interim you can also go to the clinic listed below for blood glucose rechecks and other services as listed (this is a [facility] clinic, you do not need to have insurance to go here).   You have also been referred to Dr. Domenic Polite with cardiology to establish ongoing cardiology care.    [facility]  [address] [place], [place] [address] [phone]  Services [facility] offers a variety of basic health services.  Services include but are not limited to: Blood pressure checks  Heart rate checks  Blood sugar checks  Urine analysis  Rapid strep tests  Pregnancy tests.  Health education and referrals  People needing more complex services will be directed to a physician online. Using these virtual visits, doctors can evaluate and prescribe medicine and treatments. There will be no medication on-site, though [facility] will help patients fill their prescriptions at little to no cost.   For More information please go to: [email or web address]

## 2019-10-29 ENCOUNTER — Other Ambulatory Visit: Payer: Self-pay

## 2019-10-29 ENCOUNTER — Encounter: Payer: Self-pay | Admitting: Adult Health

## 2019-10-29 ENCOUNTER — Ambulatory Visit (INDEPENDENT_AMBULATORY_CARE_PROVIDER_SITE_OTHER): Payer: Self-pay | Admitting: Adult Health

## 2019-10-29 DIAGNOSIS — F331 Major depressive disorder, recurrent, moderate: Secondary | ICD-10-CM

## 2019-10-29 DIAGNOSIS — F410 Panic disorder [episodic paroxysmal anxiety] without agoraphobia: Secondary | ICD-10-CM

## 2019-10-29 DIAGNOSIS — F411 Generalized anxiety disorder: Secondary | ICD-10-CM

## 2019-10-29 MED ORDER — ESCITALOPRAM OXALATE 10 MG PO TABS: 10.0000 mg | ORAL_TABLET | Freq: Every day | ORAL | 5 refills | Status: DC

## 2019-10-29 MED ORDER — LISINOPRIL 20 MG PO TABS: 20.0000 mg | ORAL_TABLET | Freq: Every day | ORAL | 0 refills | Status: DC

## 2019-10-29 MED ORDER — ALPRAZOLAM 1 MG PO TABS: 1.0000 mg | ORAL_TABLET | Freq: Three times a day (TID) | ORAL | 2 refills | Status: DC | PRN

## 2019-10-29 NOTE — Progress Notes (Signed)
[name] [id number] 09/19/1980 39 y.o.  Subjective:   Patient ID:  Chase Lee is a 39 y.o. (DOB January 10, 1981) male.  Chief Complaint: No chief complaint on file.   HPI Chase Lee presents to the office today for follow-up of OCD, MDD, panic attacks and GAD.   Describes mood today as "ok". Pleasant. Mood symptoms - reports decreased depression, anxiety, and irritability. Stating "I'm doing alright". Has switched jobs - security. Wife doing well. Stable interest and motivation. Taking medications as prescribed.  Energy levels stable. Active, has a regular exercise routine. Going to the gym. Enjoys some usual interests and activities. Married. Lives with wife of 7 years. Family in [place]. Spending time with family. Appetite adequate. Weight - 228 pounds. Sleeps well most nights. Averages 6 to 8 hours. Focus and concentration stable. Completing tasks. Managing aspects of household. Work going well. Denies SI or HI. Denies AH or VH.  Previous medications: Prozac, Zoloft   Review of Systems:  Review of Systems  Musculoskeletal: Negative for gait problem.  Neurological: Negative for tremors.  Psychiatric/Behavioral:       Please refer to HPI    Medications: I have reviewed the patient's current medications.  Current Outpatient Medications  Medication Sig Dispense Refill   ALPRAZolam (XANAX) 1 MG tablet Take 1 tablet (1 mg total) by mouth 3 (three) times daily as needed for anxiety. 90 tablet 2   Buprenorphine HCl-Naloxone HCl 8-2 MG FILM Take 1 Film by mouth daily.      escitalopram (LEXAPRO) 10 MG tablet Take 1 tablet (10 mg total) by mouth daily. 30 tablet 5   furosemide (LASIX) 40 MG tablet TK 1 T PO D     lisinopril (ZESTRIL) 20 MG tablet TK 1 T PO QD     lisinopril (ZESTRIL) 20 MG tablet Take 1 tablet (20 mg total) by mouth daily. 30 tablet 0   omeprazole (PRILOSEC) 40 MG capsule Take 1 capsule (40 mg total) by mouth daily. 60 capsule 0   warfarin (COUMADIN) 5 MG tablet  Take 5 mg by mouth. 23m mon-sat. Doesn't take on sundays.     No current facility-administered medications for this visit.    Medication Side Effects: None  Allergies: No Known Allergies  Past Medical History:  Diagnosis Date   Anxiety    History of artificial heart valve 2008   surgery at 89   Family History  Problem Relation Age of Onset   Depression Mother    Cancer Mother    Cancer Father     Social History   Socioeconomic History   Marital status: Married    Spouse name: Not on file   Number of children: 0   Years of education: Not on file   Highest education level: Not on file  Occupational History   Occupation: SScientist, clinical (histocompatibility and immunogenetics) [employer]  Tobacco Use   Smoking status: Former Smoker    Quit date: 08/10/2019    Years since quitting: 0.2   Smokeless tobacco: Never Used  Vaping Use   Vaping Use: Never used  Substance and Sexual Activity   Alcohol use: Never   Drug use: Never   Sexual activity: Yes  Other Topics Concern   Not on file  Social History Narrative   Not on file   Social Determinants of Health   Financial Resource Strain:    Difficulty of Paying Living Expenses:   Food Insecurity:    Worried About RWilliamsonin the Last Year:  Ran Out of Food in the Last Year:   Transportation Needs:    Film/video editor (Medical):    Lack of Transportation (Non-Medical):   Physical Activity:    Days of Exercise per Week:    Minutes of Exercise per Session:   Stress:    Feeling of Stress :   Social Connections:    Frequency of Communication with Friends and Family:    Frequency of Social Gatherings with Friends and Family:    Attends Religious Services:    Active Member of Clubs or Organizations:    Attends Music therapist:    Marital Status:   Intimate Partner Violence:    Fear of Current or Ex-Partner:    Emotionally Abused:    Physically Abused:    Sexually Abused:     Past  Medical History, Surgical history, Social history, and Family history were reviewed and updated as appropriate.   Please see review of systems for further details on the patient's review from today.   Objective:   Physical Exam:  There were no vitals taken for this visit.  Physical Exam Constitutional:      General: He is not in acute distress. Musculoskeletal:        General: No deformity.  Neurological:     Mental Status: He is alert and oriented to person, place, and time.     Coordination: Coordination normal.  Psychiatric:        Attention and Perception: Attention and perception normal. He does not perceive auditory or visual hallucinations.        Mood and Affect: Mood normal. Mood is not anxious or depressed. Affect is not labile, blunt, angry or inappropriate.        Speech: Speech normal.        Behavior: Behavior normal.        Thought Content: Thought content normal. Thought content is not paranoid or delusional. Thought content does not include homicidal or suicidal ideation. Thought content does not include homicidal or suicidal plan.        Cognition and Memory: Cognition and memory normal.        Judgment: Judgment normal.     Comments: Insight intact     Lab Review:     Component Value Date/Time   NA 136 08/12/2019 0734   K 3.5 08/12/2019 0734   CL 98 08/12/2019 0734   CO2 27 08/12/2019 0734   GLUCOSE 179 (H) 08/12/2019 0734   BUN 11 08/12/2019 0734   CREATININE 0.86 08/12/2019 0734   CALCIUM 8.8 (L) 08/12/2019 0734   GFRNONAA >60 08/12/2019 0734   GFRAA >60 08/12/2019 0734       Component Value Date/Time   WBC 11.4 (H) 08/12/2019 0734   RBC 4.88 08/12/2019 0734   HGB 14.2 08/12/2019 0734   HCT 43.2 08/12/2019 0734   PLT 249 08/12/2019 0734   MCV 88.5 08/12/2019 0734   MCH 29.1 08/12/2019 0734   MCHC 32.9 08/12/2019 0734   RDW 13.5 08/12/2019 0734    No results found for: POCLITH, LITHIUM   No results found for: PHENYTOIN, PHENOBARB,  VALPROATE, CBMZ   .res Assessment: Plan:    Plan:  1. Lexapro 76m at hs 2. Continue Xanax 111mTID  Will send in one month supply of Lisinopril 2023m must follow up with PCP for monitoring  RTC 3 months  Patient advised to contact office with any questions, adverse effects, or acute worsening in signs and symptoms.  Discussed potential benefits, risk, and side effects of benzodiazepines to include potential risk of tolerance and dependence, as well as possible drowsiness.  Advised patient not to drive if experiencing drowsiness and to take lowest possible effective dose to minimize risk of dependence and tolerance.    Diagnoses and all orders for this visit:  Major depressive disorder, recurrent episode, moderate (HCC) -     escitalopram (LEXAPRO) 10 MG tablet; Take 1 tablet (10 mg total) by mouth daily.  Generalized anxiety disorder -     escitalopram (LEXAPRO) 10 MG tablet; Take 1 tablet (10 mg total) by mouth daily. -     ALPRAZolam (XANAX) 1 MG tablet; Take 1 tablet (1 mg total) by mouth 3 (three) times daily as needed for anxiety.  Panic attacks -     escitalopram (LEXAPRO) 10 MG tablet; Take 1 tablet (10 mg total) by mouth daily. -     ALPRAZolam (XANAX) 1 MG tablet; Take 1 tablet (1 mg total) by mouth 3 (three) times daily as needed for anxiety.  Mixed obsessional thoughts and acts -     escitalopram (LEXAPRO) 10 MG tablet; Take 1 tablet (10 mg total) by mouth daily. -     ALPRAZolam (XANAX) 1 MG tablet; Take 1 tablet (1 mg total) by mouth 3 (three) times daily as needed for anxiety.  Other orders -     lisinopril (ZESTRIL) 20 MG tablet; Take 1 tablet (20 mg total) by mouth daily.     Please see After Visit Summary for patient specific instructions.  No future appointments.  No orders of the defined types were placed in this encounter.   -------------------------------

## 2019-12-08 ENCOUNTER — Telehealth: Payer: Self-pay | Admitting: Adult Health

## 2019-12-08 NOTE — Telephone Encounter (Signed)
Please review request

## 2019-12-08 NOTE — Telephone Encounter (Signed)
Chase Lee called to request refill of his warfarin.  He has been trying to find a PCP but no one is taking new patients.  He has finally been able to schedule with [facility] but it will be a couple of weeks.  He is out of his warfarin now and can't wait to be seen by the new Dr.  He is asking if you will just send in one more prescription for this medication.  He is appreciative of you helping him with the medication while searching for a new PCP.  Please send the prescription to [facility] on [address] in [place].

## 2019-12-11 ENCOUNTER — Other Ambulatory Visit: Payer: Self-pay | Admitting: Internal Medicine

## 2019-12-11 ENCOUNTER — Other Ambulatory Visit: Payer: Self-pay

## 2019-12-11 DIAGNOSIS — Z7689 Persons encountering health services in other specified circumstances: Secondary | ICD-10-CM

## 2019-12-12 ENCOUNTER — Ambulatory Visit: Payer: Self-pay | Admitting: Internal Medicine

## 2019-12-26 ENCOUNTER — Other Ambulatory Visit: Payer: Self-pay

## 2019-12-26 ENCOUNTER — Telehealth: Payer: Self-pay

## 2019-12-26 ENCOUNTER — Other Ambulatory Visit: Payer: Self-pay | Admitting: Adult Health

## 2019-12-26 DIAGNOSIS — F410 Panic disorder [episodic paroxysmal anxiety] without agoraphobia: Secondary | ICD-10-CM

## 2019-12-26 DIAGNOSIS — F411 Generalized anxiety disorder: Secondary | ICD-10-CM

## 2019-12-26 NOTE — Telephone Encounter (Signed)
Rx called into pharmacy per request, has upcoming apt

## 2019-12-26 NOTE — Telephone Encounter (Signed)
Next apt 11/10

## 2019-12-27 ENCOUNTER — Emergency Department (HOSPITAL_COMMUNITY): Payer: Self-pay

## 2019-12-27 ENCOUNTER — Encounter (HOSPITAL_COMMUNITY): Payer: Self-pay

## 2019-12-27 ENCOUNTER — Emergency Department (HOSPITAL_COMMUNITY)
Admission: EM | Admit: 2019-12-27 | Discharge: 2019-12-27 | Discharge status: Against Medical Advice | Payer: Self-pay | Attending: Emergency Medicine | Admitting: Emergency Medicine

## 2019-12-27 DIAGNOSIS — R079 Chest pain, unspecified: Secondary | ICD-10-CM

## 2019-12-27 DIAGNOSIS — Z7901 Long term (current) use of anticoagulants: Secondary | ICD-10-CM | POA: Insufficient documentation

## 2019-12-27 DIAGNOSIS — R0789 Other chest pain: Secondary | ICD-10-CM | POA: Insufficient documentation

## 2019-12-27 DIAGNOSIS — F419 Anxiety disorder, unspecified: Secondary | ICD-10-CM | POA: Insufficient documentation

## 2019-12-27 DIAGNOSIS — Z87891 Personal history of nicotine dependence: Secondary | ICD-10-CM | POA: Insufficient documentation

## 2019-12-27 DIAGNOSIS — R0602 Shortness of breath: Secondary | ICD-10-CM | POA: Insufficient documentation

## 2019-12-27 LAB — CBC
HCT: 38.6 % — ABNORMAL LOW (ref 39.0–52.0)
Hemoglobin: 13.1 g/dL (ref 13.0–17.0)
MCH: 29.2 pg (ref 26.0–34.0)
MCHC: 33.9 g/dL (ref 30.0–36.0)
MCV: 86 fL (ref 80.0–100.0)
Platelets: 215 10*3/uL (ref 150–400)
RBC: 4.49 MIL/uL (ref 4.22–5.81)
RDW: 12.9 % (ref 11.5–15.5)
WBC: 9.9 10*3/uL (ref 4.0–10.5)
nRBC: 0 % (ref 0.0–0.2)

## 2019-12-27 LAB — BASIC METABOLIC PANEL
Anion gap: 9 (ref 5–15)
BUN: 13 mg/dL (ref 6–20)
CO2: 25 mmol/L (ref 22–32)
Calcium: 8.5 mg/dL — ABNORMAL LOW (ref 8.9–10.3)
Chloride: 101 mmol/L (ref 98–111)
Creatinine, Ser: 1 mg/dL (ref 0.61–1.24)
GFR, Estimated: >60 mL/min (ref >60)
Glucose, Bld: 129 mg/dL — ABNORMAL HIGH (ref 70–99)
Potassium: 3.5 mmol/L (ref 3.5–5.1)
Sodium: 135 mmol/L (ref 135–145)

## 2019-12-27 LAB — TROPONIN I (HIGH SENSITIVITY)
Troponin I (High Sensitivity): 49 ng/L — ABNORMAL HIGH (ref <18)
Troponin I (High Sensitivity): 47 ng/L — ABNORMAL HIGH (ref <18)

## 2019-12-27 LAB — CBG MONITORING, ED: Glucose-Capillary: 119 mg/dL — ABNORMAL HIGH (ref 70–99)

## 2019-12-27 LAB — PROTIME-INR
INR: 2.2 — ABNORMAL HIGH (ref 0.8–1.2)
Prothrombin Time: 23.3 seconds — ABNORMAL HIGH (ref 11.4–15.2)

## 2019-12-27 MED ORDER — ALPRAZOLAM 0.5 MG PO TABS
1.0000 mg | ORAL_TABLET | Freq: Once | ORAL | Status: AC
  Administered 2019-12-27: 1 mg via ORAL
  Filled 2019-12-27: qty 2

## 2019-12-27 MED ORDER — ALPRAZOLAM 1 MG PO TABS: 1.0000 mg | ORAL_TABLET | Freq: Two times a day (BID) | ORAL | 0 refills | Status: DC | PRN

## 2019-12-27 NOTE — ED Provider Notes (Signed)
[facility] EMERGENCY DEPARTMENT Provider Note   CSN: [id number] Arrival date & time: 12/27/19  1014     History Chief Complaint  Patient presents with   Chest Pain    Chase Lee is a 39 y.o. male with a history of anxiety, surgical history significant for artificial valve replacement 14 years ago, presenting with chest pressure along with shortness of breath which started as he was driving home from work this morning.  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  He also reports he ran out of his Xanax 2-1/2 days ago, typically takes 1 mg 3 times daily and is unable to get his refill for several days.  He does endorse anxiety, but is concerned about chest pressure as well.  He denies diaphoresis, no nausea or vomiting denies palpitations, but has an exaggerated sensation of being able to hear his heart valve since his symptoms began.  He denies numbness in his extremities.  No dizziness or syncope.  He is on Coumadin and has been compliant with this medication.  This has been adjusted as his INR last week was subtherapeutic at 1.8.  The history is provided by the patient.       Past Medical History:  Diagnosis Date   Anxiety    History of artificial heart valve 2008   surgery at 25    There are no problems to display for this patient.   Past Surgical History:  Procedure Laterality Date   CARDIAC SURGERY         Family History  Problem Relation Age of Onset   Depression Mother    Cancer Mother    Cancer Father     Social History   Tobacco Use   Smoking status: Former Smoker    Quit date: 08/10/2019    Years since quitting: 0.3   Smokeless tobacco: Never Used  Vaping Use   Vaping Use: Never used  Substance Use Topics   Alcohol use: Never   Drug use: Never    Home Medications Prior to Admission medications   Medication Sig Start Date End Date Taking? Authorizing Provider  ALPRAZolam  (XANAX) 1 MG tablet TAKE 1 TABLET(1 MG) BY MOUTH THREE TIMES DAILY AS NEEDED FOR ANXIETY 12/26/19   Mozingo, Berdie Ogren, NP  ALPRAZolam Duanne Moron) 1 MG tablet Take 1 tablet (1 mg total) by mouth 2 (two) times daily as needed for anxiety. 12/27/19   Evalee Jefferson, PA-C  Buprenorphine HCl-Naloxone HCl 8-2 MG FILM Take 1 Film by mouth daily.     [provider]  escitalopram (LEXAPRO) 10 MG tablet Take 1 tablet (10 mg total) by mouth daily. 10/29/19   Mozingo, Berdie Ogren, NP  furosemide (LASIX) 40 MG tablet TK 1 T PO D 01/04/19   [provider]  lisinopril (ZESTRIL) 20 MG tablet TK 1 T PO QD 11/06/18   [provider]  lisinopril (ZESTRIL) 20 MG tablet Take 1 tablet (20 mg total) by mouth daily. 10/29/19   Mozingo, Berdie Ogren, NP  omeprazole (PRILOSEC) 40 MG capsule Take 1 capsule (40 mg total) by mouth daily. 04/28/19   Avegno, Darrelyn Hillock, FNP  warfarin (COUMADIN) 5 MG tablet Take 5 mg by mouth. 12m mon-sat. Doesn't take on sundays. 12/23/18   [provider]    Allergies    Patient has no known allergies.  Review of Systems   Review of Systems  Constitutional: Negative for chills and fever.  HENT: Negative for congestion and sore throat.   Eyes: Negative.   Respiratory: Positive for chest tightness and shortness of breath.   Cardiovascular: Negative for chest pain, palpitations and leg swelling.  Gastrointestinal: Negative for abdominal pain, nausea and vomiting.  Genitourinary: Negative.   Musculoskeletal: Negative for arthralgias, joint swelling and neck pain.  Skin: Negative.  Negative for rash and wound.  Neurological: Negative for dizziness, weakness, light-headedness, numbness and headaches.  Psychiatric/Behavioral: The patient is nervous/anxious.     Physical Exam Updated Vital Signs BP 131/69    Pulse (!) 51    Temp 98.2 F (36.8 C) (Oral)    Resp 10    Ht 5' 8"  (1.727 m)    Wt 104.3 kg    SpO2 100%    BMI 34.97 kg/m   Physical  Exam Vitals and nursing note reviewed.  Constitutional:      Appearance: He is well-developed.  HENT:     Head: Normocephalic and atraumatic.  Eyes:     Conjunctiva/sclera: Conjunctivae normal.  Cardiovascular:     Rate and Rhythm: Normal rate and regular rhythm.     Heart sounds: Normal heart sounds. No murmur heard.      Comments: Heart valve click appreciated. Pulmonary:     Effort: Pulmonary effort is normal.     Breath sounds: Normal breath sounds. No decreased breath sounds, wheezing or rhonchi.  Abdominal:     General: Bowel sounds are normal.     Palpations: Abdomen is soft.     Tenderness: There is no abdominal tenderness.  Musculoskeletal:        General: Normal range of motion.     Cervical back: Normal range of motion.     Right lower leg: No tenderness. No edema.     Left lower leg: No tenderness. No edema.  Skin:    General: Skin is warm and dry.  Neurological:     General: No focal deficit present.     Mental Status: He is alert.  Psychiatric:        Mood and Affect: Mood is anxious.     ED Results / Procedures / Treatments   Labs (all labs ordered are listed, but only abnormal results are displayed) Labs Reviewed  BASIC METABOLIC PANEL - Abnormal; Notable for the following components:      Result Value   Glucose, Bld 129 (*)    Calcium 8.5 (*)    All other components within normal limits  CBC - Abnormal; Notable for the following components:   HCT 38.6 (*)    All other components within normal limits  PROTIME-INR - Abnormal; Notable for the following components:   Prothrombin Time 23.3 (*)    INR 2.2 (*)    All other components within normal limits  CBG MONITORING, ED - Abnormal; Notable for the following components:   Glucose-Capillary 119 (*)    All other components within normal limits  TROPONIN I (HIGH SENSITIVITY) - Abnormal; Notable for the following components:   Troponin I (High Sensitivity) 49 (*)    All other components within normal  limits  TROPONIN I (HIGH SENSITIVITY) - Abnormal; Notable for the following components:   Troponin I (High Sensitivity) 47 (*)    All other components within normal limits    EKG EKG Interpretation  Date/Time:  Saturday December 27 2019 10:43:25 EDT Ventricular Rate:  56 PR Interval:    QRS Duration: 113 QT Interval:  462 QTC Calculation: 446 R Axis:  71 Text Interpretation: Ectopic atrial rhythm Incomplete left bundle branch block Confirmed by Milton Ferguson [id number]) on 12/27/2019 11:00:09 AM   Radiology DG Chest Portable 1 View  Result Date: 12/27/2019 CLINICAL DATA:  Chest pain. EXAM: PORTABLE CHEST 1 VIEW COMPARISON:  Radiographs 08/12/2019. FINDINGS: At 1115 hours. 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 The mediastinal contours are stable. The pulmonary vascularity is normal. There is no edema, confluent airspace opacity, pleural effusion or pneumothorax. No acute osseous findings are evident. Telemetry leads overlie the chest. IMPRESSION: Stable postoperative chest. No active cardiopulmonary process. Electronically Signed   By: Richardean Sale M.D.   On: 12/27/2019 12:09    Procedures Procedures (including critical care time)  Medications Ordered in ED Medications  ALPRAZolam Duanne Moron) tablet 1 mg (1 mg Oral Given 12/27/19 1125)    ED Course  I have reviewed the triage vital signs and the nursing notes.  Pertinent labs & imaging results that were available during my care of the patient were reviewed by me and considered in my medical decision making (see chart for details).    MDM Rules/Calculators/A&P                          Pt with resolved chest pain sx. Also much calmer after given xanax tablet.  Labs, imaging and ekg reviewed and discussed with pt.  Discussed elevation in troponins, although stable with no delta change (second trop is actually less). Recommend overnight observation for further cardiac rule out. He does not  want to stay due to no insurance.  Discussed possible outcomes including possible worsening sx, MI and death.  Also discussed sx could also be due to stress, withdrawal from his xanax and recent move to night shift and insomnia, however, still does not explain trop elevations.  Pt has chosen to leave [facility]a. He will f/u with his new pcp - establishing with [facility].  He was prescribed #2 xanax to help prevent withdrawal until he can get his regular prescription filled tomorrow.    Final Clinical Impression(s) / ED Diagnoses Final diagnoses:  Chest pain, unspecified type  Anxiety    Rx / DC Orders ED Discharge Orders         Ordered    ALPRAZolam Duanne Moron) 1 MG tablet  2 times daily PRN        12/27/19 1537           Evalee Jefferson, PA-C 12/27/19 1650    Milton Ferguson, MD 12/27/19 1702

## 2019-12-27 NOTE — ED Notes (Signed)
Pt out of meds   Complaint of Chest pain   Hx anxiety   Here for eval   Moved from Zone A  Awaiting re eval

## 2019-12-27 NOTE — ED Notes (Signed)
Pt asks to speak with PA again re whether he will stay or wishes to leave   She is informed and will return to speak with pt   He is informed

## 2019-12-27 NOTE — Discharge Instructions (Addendum)
As discussed you are leaving against our advice as you do have an elevation in your cardiac enzymes and further heart testing is warranted. Please return here if you develop any new symptoms. Also call the cardiologist listed above for an office visit for further evaluation of the symptoms.

## 2019-12-27 NOTE — ED Triage Notes (Signed)
Pt has been having chest pain that began today. He takes Xanax 3 times daily and has been out for 2 days. He believes his chest pain is related to this. Pt is having trouble with getting his thoughts together. He has been taking xanax daily for 10 years.

## 2019-12-27 NOTE — ED Notes (Signed)
Pt is very anxious.  Informed pt, PA will talk to him following call back from Cardiology.

## 2019-12-29 NOTE — Telephone Encounter (Signed)
Noted  

## 2020-01-26 ENCOUNTER — Encounter: Payer: Self-pay | Admitting: Adult Health

## 2020-01-26 ENCOUNTER — Telehealth (INDEPENDENT_AMBULATORY_CARE_PROVIDER_SITE_OTHER): Payer: Self-pay | Admitting: Adult Health

## 2020-01-26 DIAGNOSIS — F331 Major depressive disorder, recurrent, moderate: Secondary | ICD-10-CM

## 2020-01-26 DIAGNOSIS — F411 Generalized anxiety disorder: Secondary | ICD-10-CM

## 2020-01-26 DIAGNOSIS — F410 Panic disorder [episodic paroxysmal anxiety] without agoraphobia: Secondary | ICD-10-CM

## 2020-01-26 DIAGNOSIS — F422 Mixed obsessional thoughts and acts: Secondary | ICD-10-CM

## 2020-01-26 MED ORDER — ALPRAZOLAM 1 MG PO TABS: ORAL_TABLET | ORAL | 2 refills | Status: DC

## 2020-01-26 MED ORDER — ESCITALOPRAM OXALATE 10 MG PO TABS: 10.0000 mg | ORAL_TABLET | Freq: Every day | ORAL | 5 refills | Status: DC

## 2020-01-26 NOTE — Progress Notes (Signed)
Chase Lee [id number] Nov 11, 1980 39 y.o.  Virtual Visit via Telephone Note  I connected with pt on 01/26/20 at 10:40 AM EST by telephone and verified that I am speaking with the correct person using two identifiers.   I discussed the limitations, risks, security and privacy concerns of performing an evaluation and management service by telephone and the availability of in person appointments. I also discussed with the patient that there may be a patient responsible charge related to this service. The patient expressed understanding and agreed to proceed.   I discussed the assessment and treatment plan with the patient. The patient was provided an opportunity to ask questions and all were answered. The patient agreed with the plan and demonstrated an understanding of the instructions.   The patient was advised to call back or seek an in-person evaluation if the symptoms worsen or if the condition fails to improve as anticipated.  I provided 20 minutes of non-face-to-face time during this encounter.  The patient was located at home.  The provider was located at [place].   Aloha Gell, NP   Subjective:   Patient ID:  Chase Lee is a 39 y.o. (DOB May 01, 1980) male.  Chief Complaint: No chief complaint on file.   HPI Chase Lee presents for follow-up of OCD, MDD, panic attacks and GAD.   Describes mood today as "ok". Pleasant. Mood symptoms - reports decreased depression, anxiety, and irritability. Stating "everything is going alright". Wife home sick - possible Covid. Having to find a new place to live - having to move out of current home. Looking for a new place to live. Working at [employer]. Stable interest and motivation. Taking medications as prescribed.  Energy levels stable. Active, has a regular exercise routine.  Enjoys some usual interests and activities. Married. Lives with wife of 7 years. Family in [place]. Spending time with family. Appetite adequate.  Weight stable - 235 pounds.  Sleeps well most nights. Averages 6 to 8 hours. Focus and concentration stable. Completing tasks. Managing aspects of household. Work going well. Denies SI or HI. Denies AH or VH.  Previous medications: Prozac, Zoloft  Review of Systems:  Review of Systems  Musculoskeletal: Negative for gait problem.  Neurological: Negative for tremors.  Psychiatric/Behavioral:       Please refer to HPI    Medications: I have reviewed the patient's current medications.  Current Outpatient Medications  Medication Sig Dispense Refill   ALPRAZolam (XANAX) 1 MG tablet Take 1 tablet (1 mg total) by mouth 2 (two) times daily as needed for anxiety. 2 tablet 0   ALPRAZolam (XANAX) 1 MG tablet TAKE 1 TABLET(1 MG) BY MOUTH THREE TIMES DAILY AS NEEDED FOR ANXIETY 90 tablet 2   Buprenorphine HCl-Naloxone HCl 8-2 MG FILM Take 1 Film by mouth daily.      escitalopram (LEXAPRO) 10 MG tablet Take 1 tablet (10 mg total) by mouth daily. 30 tablet 5   furosemide (LASIX) 40 MG tablet TK 1 T PO D     lisinopril (ZESTRIL) 20 MG tablet TK 1 T PO QD     lisinopril (ZESTRIL) 20 MG tablet Take 1 tablet (20 mg total) by mouth daily. 30 tablet 0   omeprazole (PRILOSEC) 40 MG capsule Take 1 capsule (40 mg total) by mouth daily. 60 capsule 0   warfarin (COUMADIN) 5 MG tablet Take 5 mg by mouth. 63m mon-sat. Doesn't take on sundays.     No current facility-administered medications for this visit.  Medication Side Effects: None  Allergies: No Known Allergies  Past Medical History:  Diagnosis Date   Anxiety    History of artificial heart valve 2008   surgery at 11    Family History  Problem Relation Age of Onset   Depression Mother    Cancer Mother    Cancer Father     Social History   Socioeconomic History   Marital status: Married    Spouse name: Not on file   Number of children: 0   Years of education: Not on file   Highest education level: Not on file   Occupational History   Occupation: Scientist, clinical (histocompatibility and immunogenetics): [employer]  Tobacco Use   Smoking status: Former Smoker    Quit date: 08/10/2019    Years since quitting: 0.4   Smokeless tobacco: Never Used  Vaping Use   Vaping Use: Never used  Substance and Sexual Activity   Alcohol use: Never   Drug use: Never   Sexual activity: Yes  Other Topics Concern   Not on file  Social History Narrative   Not on file   Social Determinants of Health   Financial Resource Strain:    Difficulty of Paying Living Expenses: Not on file  Food Insecurity:    Worried About Charity fundraiser in the Last Year: Not on file   [employer] of Food in the Last Year: Not on file  Transportation Needs:    Lack of Transportation (Medical): Not on file   Lack of Transportation (Non-Medical): Not on file  Physical Activity:    Days of Exercise per Week: Not on file   Minutes of Exercise per Session: Not on file  Stress:    Feeling of Stress : Not on file  Social Connections:    Frequency of Communication with Friends and Family: Not on file   Frequency of Social Gatherings with Friends and Family: Not on file   Attends Religious Services: Not on file   Active Member of Clubs or Organizations: Not on file   Attends Archivist Meetings: Not on file   Marital Status: Not on file  Intimate Partner Violence:    Fear of Current or Ex-Partner: Not on file   Emotionally Abused: Not on file   Physically Abused: Not on file   Sexually Abused: Not on file    Past Medical History, Surgical history, Social history, and Family history were reviewed and updated as appropriate.   Please see review of systems for further details on the patient's review from today.   Objective:   Physical Exam:  There were no vitals taken for this visit.  Physical Exam Neurological:     Mental Status: He is alert and oriented to person, place, and time.     Cranial Nerves: No dysarthria.   Psychiatric:        Attention and Perception: Attention and perception normal.        Mood and Affect: Mood normal.        Speech: Speech normal.        Behavior: Behavior is cooperative.        Thought Content: Thought content normal. Thought content is not paranoid or delusional. Thought content does not include homicidal or suicidal ideation. Thought content does not include homicidal or suicidal plan.        Cognition and Memory: Cognition and memory normal.        Judgment: Judgment normal.     Comments: Insight  intact     Lab Review:     Component Value Date/Time   NA 135 12/27/2019 1112   K 3.5 12/27/2019 1112   CL 101 12/27/2019 1112   CO2 25 12/27/2019 1112   GLUCOSE 129 (H) 12/27/2019 1112   BUN 13 12/27/2019 1112   CREATININE 1.00 12/27/2019 1112   CALCIUM 8.5 (L) 12/27/2019 1112   GFRNONAA >60 12/27/2019 1112   GFRAA >60 08/12/2019 0734       Component Value Date/Time   WBC 9.9 12/27/2019 1112   RBC 4.49 12/27/2019 1112   HGB 13.1 12/27/2019 1112   HCT 38.6 (L) 12/27/2019 1112   PLT 215 12/27/2019 1112   MCV 86.0 12/27/2019 1112   MCH 29.2 12/27/2019 1112   MCHC 33.9 12/27/2019 1112   RDW 12.9 12/27/2019 1112    No results found for: POCLITH, LITHIUM   No results found for: PHENYTOIN, PHENOBARB, VALPROATE, CBMZ   .res Assessment: Plan:    Plan:  1. Lexapro 89m at hs 2. Continue Xanax 172mTID  RTC 3 months  Patient advised to contact office with any questions, adverse effects, or acute worsening in signs and symptoms.  Discussed potential benefits, risk, and side effects of benzodiazepines to include potential risk of tolerance and dependence, as well as possible drowsiness.  Advised patient not to drive if experiencing drowsiness and to take lowest possible effective dose to minimize risk of dependence and tolerance.    Diagnoses and all orders for this visit:  Major depressive disorder, recurrent episode, moderate (HCC) -     escitalopram  (LEXAPRO) 10 MG tablet; Take 1 tablet (10 mg total) by mouth daily.  Generalized anxiety disorder -     escitalopram (LEXAPRO) 10 MG tablet; Take 1 tablet (10 mg total) by mouth daily. -     ALPRAZolam (XANAX) 1 MG tablet; TAKE 1 TABLET(1 MG) BY MOUTH THREE TIMES DAILY AS NEEDED FOR ANXIETY  Panic attacks -     escitalopram (LEXAPRO) 10 MG tablet; Take 1 tablet (10 mg total) by mouth daily. -     ALPRAZolam (XANAX) 1 MG tablet; TAKE 1 TABLET(1 MG) BY MOUTH THREE TIMES DAILY AS NEEDED FOR ANXIETY  Mixed obsessional thoughts and acts    Please see After Visit Summary for patient specific instructions.  No future appointments.  No orders of the defined types were placed in this encounter.     -------------------------------

## 2020-01-28 ENCOUNTER — Ambulatory Visit: Payer: Self-pay | Admitting: Adult Health

## 2020-04-21 ENCOUNTER — Telehealth: Payer: Self-pay | Admitting: Adult Health

## 2020-04-21 ENCOUNTER — Other Ambulatory Visit: Payer: Self-pay | Admitting: Adult Health

## 2020-04-21 DIAGNOSIS — F410 Panic disorder [episodic paroxysmal anxiety] without agoraphobia: Secondary | ICD-10-CM

## 2020-04-21 DIAGNOSIS — F411 Generalized anxiety disorder: Secondary | ICD-10-CM

## 2020-04-21 MED ORDER — ALPRAZOLAM 1 MG PO TABS: ORAL_TABLET | ORAL | 2 refills | Status: DC

## 2020-04-21 NOTE — Telephone Encounter (Signed)
Pt lm concerning his refills. He stated they would run out before his scheduled appt on 2/22. Fill all Rx's that are due prior to his scheduled appt. Fill at the [facility] on [address]

## 2020-04-21 NOTE — Telephone Encounter (Signed)
Script sent  

## 2020-05-11 ENCOUNTER — Ambulatory Visit: Payer: Self-pay | Admitting: Adult Health

## 2020-05-27 ENCOUNTER — Other Ambulatory Visit: Payer: Self-pay | Admitting: Adult Health

## 2020-05-27 DIAGNOSIS — F410 Panic disorder [episodic paroxysmal anxiety] without agoraphobia: Secondary | ICD-10-CM

## 2020-05-27 DIAGNOSIS — F411 Generalized anxiety disorder: Secondary | ICD-10-CM

## 2020-06-08 ENCOUNTER — Encounter: Payer: Self-pay | Admitting: Adult Health

## 2020-06-08 ENCOUNTER — Ambulatory Visit (INDEPENDENT_AMBULATORY_CARE_PROVIDER_SITE_OTHER): Payer: Self-pay | Admitting: Adult Health

## 2020-06-08 ENCOUNTER — Other Ambulatory Visit: Payer: Self-pay

## 2020-06-08 DIAGNOSIS — F331 Major depressive disorder, recurrent, moderate: Secondary | ICD-10-CM

## 2020-06-08 DIAGNOSIS — F422 Mixed obsessional thoughts and acts: Secondary | ICD-10-CM

## 2020-06-08 DIAGNOSIS — F410 Panic disorder [episodic paroxysmal anxiety] without agoraphobia: Secondary | ICD-10-CM

## 2020-06-08 DIAGNOSIS — F411 Generalized anxiety disorder: Secondary | ICD-10-CM

## 2020-06-08 NOTE — Progress Notes (Signed)
Suhaib Guzzo [id number] 07/09/1980 40 y.o.  Subjective:   Patient ID:  [place] is a 40 y.o. (DOB 11-28-1980) male.  Chief Complaint: No chief complaint on file.   HPI Chase Lee presents to the office today for follow-up of OCD, MDD, panic attacks and GAD.   Describes mood today as "ok". Pleasant. Mood symptoms - reports decreased depression, anxiety, and irritability. Stating "I felt depressed for about a month". Grandmother passed away since last visit. Has been thinking about moving back to [facility] - has a "good job offer". Plans to do a 3 month trial before making up his mind. Wife concerned about leaving grandchildren. Stable interest and motivation. Taking medications as prescribed.  Energy levels stable. Active, has a regular exercise routine.  Enjoys some usual interests and activities. Married. Lives with wife. Family in [place]. Spending time with family. Appetite adequate. Weight stable - 235 pounds.  Sleeps well most nights. Averages 6 to 8 hours. Focus and concentration stable. Completing tasks. Managing aspects of household. Work going well.  Denies SI or HI.  Denies AH or VH.  Previous medications: Prozac, Zoloft  Review of Systems:  Review of Systems  Musculoskeletal: Negative for gait problem.  Neurological: Negative for tremors.  Psychiatric/Behavioral:       Please refer to HPI    Medications: I have reviewed the patient's current medications.  Current Outpatient Medications  Medication Sig Dispense Refill  . ALPRAZolam (XANAX) 1 MG tablet Take 1 tablet (1 mg total) by mouth 2 (two) times daily as needed for anxiety. 2 tablet 0  . ALPRAZolam (XANAX) 1 MG tablet TAKE 1 TABLET(1 MG) BY MOUTH THREE TIMES DAILY AS NEEDED FOR ANXIETY 90 tablet 2  . Buprenorphine HCl-Naloxone HCl 8-2 MG FILM Take 1 Film by mouth daily.     Marland Kitchen escitalopram (LEXAPRO) 10 MG tablet Take 1 tablet (10 mg total) by mouth daily. 30 tablet 5  . furosemide (LASIX) 40 MG tablet TK 1 T PO D    .  lisinopril (ZESTRIL) 20 MG tablet TK 1 T PO QD    . lisinopril (ZESTRIL) 20 MG tablet Take 1 tablet (20 mg total) by mouth daily. 30 tablet 0  . omeprazole (PRILOSEC) 40 MG capsule Take 1 capsule (40 mg total) by mouth daily. 60 capsule 0  . warfarin (COUMADIN) 5 MG tablet Take 5 mg by mouth. 75m mon-sat. Doesn't take on sundays.     No current facility-administered medications for this visit.    Medication Side Effects: None  Allergies: No Known Allergies  Past Medical History:  Diagnosis Date  . Anxiety   . History of artificial heart valve 2008   surgery at 89   Family History  Problem Relation Age of Onset  . Depression Mother   . Cancer Mother   . Cancer Father     Social History   Socioeconomic History  . Marital status: Married    Spouse name: Not on file  . Number of children: 0  . Years of education: Not on file  . Highest education level: Not on file  Occupational History  . Occupation: SScientist, clinical (histocompatibility and immunogenetics) [employer]  Tobacco Use  . Smoking status: Former Smoker    Quit date: 08/10/2019    Years since quitting: 0.8  . Smokeless tobacco: Never Used  Vaping Use  . Vaping Use: Never used  Substance and Sexual Activity  . Alcohol use: Never  . Drug use: Never  . Sexual activity: Yes  Other Topics Concern  . Not on file  Social History Narrative  . Not on file   Social Determinants of Health   Financial Resource Strain: Not on file  Food Insecurity: Not on file  Transportation Needs: Not on file  Physical Activity: Not on file  Stress: Not on file  Social Connections: Not on file  Intimate Partner Violence: Not on file    Past Medical History, Surgical history, Social history, and Family history were reviewed and updated as appropriate.   Please see review of systems for further details on the patient's review from today.   Objective:   Physical Exam:  There were no vitals taken for this visit.  Physical Exam Constitutional:      General:  He is not in acute distress. Musculoskeletal:        General: No deformity.  Neurological:     Mental Status: He is alert and oriented to person, place, and time.     Coordination: Coordination normal.  Psychiatric:        Attention and Perception: Attention and perception normal. He does not perceive auditory or visual hallucinations.        Mood and Affect: Mood normal. Mood is not anxious or depressed. Affect is not labile, blunt, angry or inappropriate.        Speech: Speech normal.        Behavior: Behavior normal.        Thought Content: Thought content normal. Thought content is not paranoid or delusional. Thought content does not include homicidal or suicidal ideation. Thought content does not include homicidal or suicidal plan.        Cognition and Memory: Cognition and memory normal.        Judgment: Judgment normal.     Comments: Insight intact     Lab Review:     Component Value Date/Time   NA 135 12/27/2019 1112   K 3.5 12/27/2019 1112   CL 101 12/27/2019 1112   CO2 25 12/27/2019 1112   GLUCOSE 129 (H) 12/27/2019 1112   BUN 13 12/27/2019 1112   CREATININE 1.00 12/27/2019 1112   CALCIUM 8.5 (L) 12/27/2019 1112   GFRNONAA >60 12/27/2019 1112   GFRAA >60 08/12/2019 0734       Component Value Date/Time   WBC 9.9 12/27/2019 1112   RBC 4.49 12/27/2019 1112   HGB 13.1 12/27/2019 1112   HCT 38.6 (L) 12/27/2019 1112   PLT 215 12/27/2019 1112   MCV 86.0 12/27/2019 1112   MCH 29.2 12/27/2019 1112   MCHC 33.9 12/27/2019 1112   RDW 12.9 12/27/2019 1112    No results found for: POCLITH, LITHIUM   No results found for: PHENYTOIN, PHENOBARB, VALPROATE, CBMZ   .res Assessment: Plan:     Plan:  1. Lexapro 86m at hs 2. Continue Xanax 166mTID  RTC 3 months  Patient advised to contact office with any questions, adverse effects, or acute worsening in signs and symptoms.  Discussed potential benefits, risk, and side effects of benzodiazepines to include potential  risk of tolerance and dependence, as well as possible drowsiness.  Advised patient not to drive if experiencing drowsiness and to take lowest possible effective dose to minimize risk of dependence and tolerance.    Diagnoses and all orders for this visit:  Generalized anxiety disorder  Panic attacks  Major depressive disorder, recurrent episode, moderate ([facility] Mixed obsessional thoughts and acts     Please see After Visit Summary for patient specific  instructions.  No future appointments.  No orders of the defined types were placed in this encounter.   -------------------------------

## 2020-07-25 ENCOUNTER — Other Ambulatory Visit: Payer: Self-pay | Admitting: Adult Health

## 2020-07-25 DIAGNOSIS — F410 Panic disorder [episodic paroxysmal anxiety] without agoraphobia: Secondary | ICD-10-CM

## 2020-07-25 DIAGNOSIS — F411 Generalized anxiety disorder: Secondary | ICD-10-CM

## 2020-07-26 ENCOUNTER — Telehealth: Payer: Self-pay | Admitting: Adult Health

## 2020-07-26 ENCOUNTER — Other Ambulatory Visit: Payer: Self-pay | Admitting: Adult Health

## 2020-07-26 NOTE — Telephone Encounter (Signed)
He is not prescribed Adderall by this office.

## 2020-07-26 NOTE — Telephone Encounter (Signed)
Please review

## 2020-07-26 NOTE — Telephone Encounter (Signed)
Next visit is 09/09/20. Rai needs a refill on his Adderall. He was taking 10 mg and he would like to go up to 20 mg now as with the 10 mg he feels no difference. Pharmacy is:  [facility] [place], [address] Phone:  [phone]  Fax:  [phone]

## 2020-07-27 ENCOUNTER — Other Ambulatory Visit: Payer: Self-pay | Admitting: Adult Health

## 2020-07-27 DIAGNOSIS — F410 Panic disorder [episodic paroxysmal anxiety] without agoraphobia: Secondary | ICD-10-CM

## 2020-07-27 DIAGNOSIS — F411 Generalized anxiety disorder: Secondary | ICD-10-CM

## 2020-07-27 NOTE — Telephone Encounter (Signed)
I tried to call and find out what he needs but he has not answered,LVM

## 2020-07-27 NOTE — Telephone Encounter (Signed)
Noted  

## 2020-07-27 NOTE — Telephone Encounter (Signed)
Last filled 06/28/20

## 2020-08-23 ENCOUNTER — Other Ambulatory Visit: Payer: Self-pay

## 2020-08-23 ENCOUNTER — Telehealth: Payer: Self-pay | Admitting: Adult Health

## 2020-08-23 DIAGNOSIS — F331 Major depressive disorder, recurrent, moderate: Secondary | ICD-10-CM

## 2020-08-23 DIAGNOSIS — F411 Generalized anxiety disorder: Secondary | ICD-10-CM

## 2020-08-23 DIAGNOSIS — F410 Panic disorder [episodic paroxysmal anxiety] without agoraphobia: Secondary | ICD-10-CM

## 2020-08-23 MED ORDER — ALPRAZOLAM 1 MG PO TABS: ORAL_TABLET | ORAL | 0 refills | Status: DC

## 2020-08-23 MED ORDER — ESCITALOPRAM OXALATE 10 MG PO TABS: 10.0000 mg | ORAL_TABLET | Freq: Every day | ORAL | 0 refills | Status: DC

## 2020-08-23 NOTE — Telephone Encounter (Signed)
Patient is requesting a refill on his Xanax and enough Lexapro until his appointment on 6/23. Fill at the [facility] at [place] [address] # [phone]. This is a one time refill request due to his job location for the rest of this month. Future refill request at the pharmacy on file. Please contact Mavin at # [phone] to confirm this request. A msg or email is ok if he can't answer the phone due to his job.

## 2020-08-23 NOTE — Telephone Encounter (Signed)
pended

## 2020-09-09 ENCOUNTER — Telehealth (INDEPENDENT_AMBULATORY_CARE_PROVIDER_SITE_OTHER): Payer: Self-pay | Admitting: Adult Health

## 2020-09-09 ENCOUNTER — Encounter: Payer: Self-pay | Admitting: Adult Health

## 2020-09-09 DIAGNOSIS — F410 Panic disorder [episodic paroxysmal anxiety] without agoraphobia: Secondary | ICD-10-CM

## 2020-09-09 DIAGNOSIS — F411 Generalized anxiety disorder: Secondary | ICD-10-CM

## 2020-09-09 DIAGNOSIS — F331 Major depressive disorder, recurrent, moderate: Secondary | ICD-10-CM

## 2020-09-09 DIAGNOSIS — F422 Mixed obsessional thoughts and acts: Secondary | ICD-10-CM

## 2020-09-09 MED ORDER — ESCITALOPRAM OXALATE 10 MG PO TABS: 10.0000 mg | ORAL_TABLET | Freq: Every day | ORAL | 5 refills | Status: DC

## 2020-09-09 MED ORDER — ALPRAZOLAM 1 MG PO TABS: ORAL_TABLET | ORAL | 2 refills | Status: DC

## 2020-09-09 NOTE — Progress Notes (Signed)
Chase Lee [id number] 07-27-1980 40 y.o.  Virtual Visit via Telephone Note  I connected with pt on 09/09/20 at 10:00 AM EDT by telephone and verified that I am speaking with the correct person using two identifiers.   I discussed the limitations, risks, security and privacy concerns of performing an evaluation and management service by telephone and the availability of in person appointments. I also discussed with the patient that there may be a patient responsible charge related to this service. The patient expressed understanding and agreed to proceed.   I discussed the assessment and treatment plan with the patient. The patient was provided an opportunity to ask questions and all were answered. The patient agreed with the plan and demonstrated an understanding of the instructions.   The patient was advised to call back or seek an in-person evaluation if the symptoms worsen or if the condition fails to improve as anticipated.  I provided 30 minutes of non-face-to-face time during this encounter.  The patient was located at home.  The provider was located at [place].   Aloha Gell, NP   Subjective:   Patient ID:  [place] is a 40 y.o. (DOB [birth year]) male.  Chief Complaint: No chief complaint on file.   HPI Yorel Redder presents for follow-up of OCD, MDD, panic attacks and GAD.   Describes mood today as "ok". Pleasant. Mood symptoms - reports decreased depression, anxiety, and irritability. Stating "I feel like I'm doing pretty good". Feels like medications continue to work well for him. He and wife doing well. Visiting family in [place]. Stable interest and motivation. Taking medications as prescribed.  Energy levels stable. Active, has a regular exercise routine.  Enjoys some usual interests and activities. Married. Lives with wife. Family in [place]. Spending time with family. Appetite adequate. Weight stable - 235 pounds.  Sleeps well most nights. Averages 6 to 8  hours. Focus and concentration stable. Completing tasks. Managing aspects of household. Work going well.  Denies SI or HI.  Denies AH or VH.  Previous medications: Prozac, Zoloft  Review of Systems:  Review of Systems  Musculoskeletal:  Negative for gait problem.  Neurological:  Negative for tremors.  Psychiatric/Behavioral:         Please refer to HPI   Medications: I have reviewed the patient's current medications.  Current Outpatient Medications  Medication Sig Dispense Refill   ALPRAZolam (XANAX) 1 MG tablet TAKE 1 TABLET(1 MG) BY MOUTH THREE TIMES DAILY AS NEEDED FOR ANXIETY 90 tablet 2   Buprenorphine HCl-Naloxone HCl 8-2 MG FILM Take 1 Film by mouth daily.      escitalopram (LEXAPRO) 10 MG tablet Take 1 tablet (10 mg total) by mouth daily. 30 tablet 5   furosemide (LASIX) 40 MG tablet TK 1 T PO D     lisinopril (ZESTRIL) 20 MG tablet TK 1 T PO QD     lisinopril (ZESTRIL) 20 MG tablet Take 1 tablet (20 mg total) by mouth daily. 30 tablet 0   omeprazole (PRILOSEC) 40 MG capsule Take 1 capsule (40 mg total) by mouth daily. 60 capsule 0   warfarin (COUMADIN) 5 MG tablet Take 5 mg by mouth. 73m mon-sat. Doesn't take on sundays.     No current facility-administered medications for this visit.    Medication Side Effects: None  Allergies: No Known Allergies  Past Medical History:  Diagnosis Date   Anxiety    History of artificial heart valve 2008   surgery at 89  Family History  Problem Relation Age of Onset   Depression Mother    Cancer Mother    Cancer Father     Social History   Socioeconomic History   Marital status: Married    Spouse name: Not on file   Number of children: 0   Years of education: Not on file   Highest education level: Not on file  Occupational History   Occupation: Sales    Employer: [employer]  Tobacco Use   Smoking status: Former    Pack years: 0.00    Types: Cigarettes    Quit date: 08/10/2019    Years since quitting: 1.0    Smokeless tobacco: Never  Vaping Use   Vaping Use: Never used  Substance and Sexual Activity   Alcohol use: Never   Drug use: Never   Sexual activity: Yes  Other Topics Concern   Not on file  Social History Narrative   Not on file   Social Determinants of Health   Financial Resource Strain: Not on file  Food Insecurity: Not on file  Transportation Needs: Not on file  Physical Activity: Not on file  Stress: Not on file  Social Connections: Not on file  Intimate Partner Violence: Not on file    Past Medical History, Surgical history, Social history, and Family history were reviewed and updated as appropriate.   Please see review of systems for further details on the patient's review from today.   Objective:   Physical Exam:  There were no vitals taken for this visit.  Physical Exam Constitutional:      General: He is not in acute distress. Musculoskeletal:        General: No deformity.  Neurological:     Mental Status: He is alert and oriented to person, place, and time.     Coordination: Coordination normal.  Psychiatric:        Attention and Perception: Attention and perception normal. He does not perceive auditory or visual hallucinations.        Mood and Affect: Mood normal. Mood is not anxious or depressed. Affect is not labile, blunt, angry or inappropriate.        Speech: Speech normal.        Behavior: Behavior normal.        Thought Content: Thought content normal. Thought content is not paranoid or delusional. Thought content does not include homicidal or suicidal ideation. Thought content does not include homicidal or suicidal plan.        Cognition and Memory: Cognition and memory normal.        Judgment: Judgment normal.     Comments: Insight intact    Lab Review:     Component Value Date/Time   NA 135 12/27/2019 1112   K 3.5 12/27/2019 1112   CL 101 12/27/2019 1112   CO2 25 12/27/2019 1112   GLUCOSE 129 (H) 12/27/2019 1112   BUN 13 12/27/2019  1112   CREATININE 1.00 12/27/2019 1112   CALCIUM 8.5 (L) 12/27/2019 1112   GFRNONAA >60 12/27/2019 1112   GFRAA >60 08/12/2019 0734       Component Value Date/Time   WBC 9.9 12/27/2019 1112   RBC 4.49 12/27/2019 1112   HGB 13.1 12/27/2019 1112   HCT 38.6 (L) 12/27/2019 1112   PLT 215 12/27/2019 1112   MCV 86.0 12/27/2019 1112   MCH 29.2 12/27/2019 1112   MCHC 33.9 12/27/2019 1112   RDW 12.9 12/27/2019 1112    No results found  for: POCLITH, LITHIUM   No results found for: PHENYTOIN, PHENOBARB, VALPROATE, CBMZ   .res Assessment: Plan:      Plan:  1. Lexapro 49m at hs 2. Continue Xanax 164mTID  RTC 3 months  Patient advised to contact office with any questions, adverse effects, or acute worsening in signs and symptoms.  Discussed potential benefits, risk, and side effects of benzodiazepines to include potential risk of tolerance and dependence, as well as possible drowsiness.  Advised patient not to drive if experiencing drowsiness and to take lowest possible effective dose to minimize risk of dependence and tolerance.  Diagnoses and all orders for this visit:  Mixed obsessional thoughts and acts  Generalized anxiety disorder -     ALPRAZolam (XANAX) 1 MG tablet; TAKE 1 TABLET(1 MG) BY MOUTH THREE TIMES DAILY AS NEEDED FOR ANXIETY -     escitalopram (LEXAPRO) 10 MG tablet; Take 1 tablet (10 mg total) by mouth daily.  Panic attacks -     ALPRAZolam (XANAX) 1 MG tablet; TAKE 1 TABLET(1 MG) BY MOUTH THREE TIMES DAILY AS NEEDED FOR ANXIETY -     escitalopram (LEXAPRO) 10 MG tablet; Take 1 tablet (10 mg total) by mouth daily.  Major depressive disorder, recurrent episode, moderate (HCC) -     escitalopram (LEXAPRO) 10 MG tablet; Take 1 tablet (10 mg total) by mouth daily.   Please see After Visit Summary for patient specific instructions.  No future appointments.   No orders of the defined types were placed in this encounter.      -------------------------------

## 2020-12-22 ENCOUNTER — Other Ambulatory Visit: Payer: Self-pay

## 2020-12-22 ENCOUNTER — Telehealth: Payer: Self-pay | Admitting: Adult Health

## 2020-12-22 DIAGNOSIS — F331 Major depressive disorder, recurrent, moderate: Secondary | ICD-10-CM

## 2020-12-22 DIAGNOSIS — F410 Panic disorder [episodic paroxysmal anxiety] without agoraphobia: Secondary | ICD-10-CM

## 2020-12-22 DIAGNOSIS — F411 Generalized anxiety disorder: Secondary | ICD-10-CM

## 2020-12-22 MED ORDER — ALPRAZOLAM 1 MG PO TABS: ORAL_TABLET | ORAL | 0 refills | Status: DC

## 2020-12-22 MED ORDER — ESCITALOPRAM OXALATE 10 MG PO TABS: 10.0000 mg | ORAL_TABLET | Freq: Every day | ORAL | 0 refills | Status: DC

## 2020-12-22 NOTE — Telephone Encounter (Signed)
Patient called requesting refills on the Xanax and Lexapro. Fill at the [facility] [place] [facility] [place]  [address], [place] [address]  Phone:  [phone]  Fax:  [phone].Patient will be out before his scheduled appointment on 10/11. Last visit 6/23 by phone.

## 2020-12-22 NOTE — Telephone Encounter (Signed)
pended

## 2020-12-28 ENCOUNTER — Ambulatory Visit: Payer: Self-pay | Admitting: Adult Health

## 2020-12-28 ENCOUNTER — Encounter: Payer: Self-pay | Admitting: Adult Health

## 2020-12-28 NOTE — Progress Notes (Signed)
Patient no show appointment. ? ?

## 2020-12-31 ENCOUNTER — Ambulatory Visit (INDEPENDENT_AMBULATORY_CARE_PROVIDER_SITE_OTHER): Payer: Self-pay | Admitting: Adult Health

## 2020-12-31 ENCOUNTER — Encounter: Payer: Self-pay | Admitting: Adult Health

## 2020-12-31 DIAGNOSIS — F411 Generalized anxiety disorder: Secondary | ICD-10-CM

## 2020-12-31 DIAGNOSIS — F422 Mixed obsessional thoughts and acts: Secondary | ICD-10-CM

## 2020-12-31 DIAGNOSIS — F410 Panic disorder [episodic paroxysmal anxiety] without agoraphobia: Secondary | ICD-10-CM

## 2020-12-31 DIAGNOSIS — F331 Major depressive disorder, recurrent, moderate: Secondary | ICD-10-CM

## 2020-12-31 MED ORDER — ALPRAZOLAM 1 MG PO TABS: ORAL_TABLET | ORAL | 2 refills | Status: DC

## 2020-12-31 MED ORDER — ESCITALOPRAM OXALATE 10 MG PO TABS: 10.0000 mg | ORAL_TABLET | Freq: Every day | ORAL | 5 refills | Status: DC

## 2020-12-31 NOTE — Progress Notes (Signed)
Chase Lee [id number] 08/14/1980 40 y.o.  Virtual Visit via Telephone Note  I connected with pt on 12/31/20 at 11:20 AM EDT by telephone and verified that I am speaking with the correct person using two identifiers.   I discussed the limitations, risks, security and privacy concerns of performing an evaluation and management service by telephone and the availability of in person appointments. I also discussed with the patient that there may be a patient responsible charge related to this service. The patient expressed understanding and agreed to proceed.   I discussed the assessment and treatment plan with the patient. The patient was provided an opportunity to ask questions and all were answered. The patient agreed with the plan and demonstrated an understanding of the instructions.   The patient was advised to call back or seek an in-person evaluation if the symptoms worsen or if the condition fails to improve as anticipated.  I provided 25 minutes of non-face-to-face time during this encounter.  The patient was located at home.  The provider was located at [place].   Aloha Gell, NP   Subjective:   Patient ID:  Chase Lee is a 40 y.o. (DOB 1980/05/01) male.  Chief Complaint: No chief complaint on file.   HPI [name] presents for follow-up of OCD, MDD, panic attacks and GAD.   Describes mood today as "ok". Pleasant. Mood symptoms - reports depression - "I feel like it's because I haven't been in the house of god". Feels like Covid has changed the way he used to worship. Reports anxiety and irritability at times. Stating "I'm doing alright". Feels like medications continue to work well for him. He and wife doing well. Visiting family in [place]. Stable interest and motivation. Taking medications as prescribed.  Energy levels stable. Active, has a regular exercise routine.  Enjoys some usual interests and activities. Married. Lives with wife. Family in [place]. Spending  time with family. Appetite adequate. Weight stable - 232 pounds.  Sleeps well most nights. Averages 6 to 8 hours. Focus and concentration stable. Completing tasks. Managing aspects of household. 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  Denies AH or VH.  Previous medications: Prozac, Zoloft  Review of Systems:  Review of Systems  Musculoskeletal:  Negative for gait problem.  Neurological:  Negative for tremors.  Psychiatric/Behavioral:         Please refer to HPI   Medications: I have reviewed the patient's current medications.  Current Outpatient Medications  Medication Sig Dispense Refill   ALPRAZolam (XANAX) 1 MG tablet TAKE 1 TABLET(1 MG) BY MOUTH THREE TIMES DAILY AS NEEDED FOR ANXIETY 90 tablet 0   Buprenorphine HCl-Naloxone HCl 8-2 MG FILM Take 1 Film by mouth daily.      escitalopram (LEXAPRO) 10 MG tablet Take 1 tablet (10 mg total) by mouth daily. 30 tablet 0   furosemide (LASIX) 40 MG tablet TK 1 T PO D     lisinopril (ZESTRIL) 20 MG tablet TK 1 T PO QD     lisinopril (ZESTRIL) 20 MG tablet Take 1 tablet (20 mg total) by mouth daily. 30 tablet 0   omeprazole (PRILOSEC) 40 MG capsule Take 1 capsule (40 mg total) by mouth daily. 60 capsule 0   warfarin (COUMADIN) 5 MG tablet Take 5 mg by mouth. 11m mon-sat. Doesn't take on sundays.     No current facility-administered medications for this visit.    Medication Side Effects: None  Allergies: No Known Allergies  Past Medical History:  Diagnosis Date   Anxiety    History of artificial heart valve 2008   surgery at 43    Family History  Problem Relation Age of Onset   Depression Mother    Cancer Mother    Cancer Father     Social History   Socioeconomic History   Marital status: Married    Spouse name: Not on file   Number of children: 0   Years of education: Not on file   Highest education level: Not on file  Occupational History   Occupation: Sales    Employer: [employer]  Tobacco Use    Smoking status: Former    Types: Cigarettes    Quit date: 08/10/2019    Years since quitting: 1.3   Smokeless tobacco: Never  Vaping Use   Vaping Use: Never used  Substance and Sexual Activity   Alcohol use: Never   Drug use: Never   Sexual activity: Yes  Other Topics Concern   Not on file  Social History Narrative   Not on file   Social Determinants of Health   Financial Resource Strain: Not on file  Food Insecurity: Not on file  Transportation Needs: Not on file  Physical Activity: Not on file  Stress: Not on file  Social Connections: Not on file  Intimate Partner Violence: Not on file    Past Medical History, Surgical history, Social history, and Family history were reviewed and updated as appropriate.   Please see review of systems for further details on the patient's review from today.   Objective:   Physical Exam:  There were no vitals taken for this visit.  Physical Exam Constitutional:      General: He is not in acute distress. Musculoskeletal:        General: No deformity.  Neurological:     Mental Status: He is alert and oriented to person, place, and time.     Coordination: Coordination normal.  Psychiatric:        Attention and Perception: Attention and perception normal. He does not perceive auditory or visual hallucinations.        Mood and Affect: Mood normal. Mood is not anxious or depressed. Affect is not labile, blunt, angry or inappropriate.        Speech: Speech normal.        Behavior: Behavior normal.        Thought Content: Thought content normal. Thought content is not paranoid or delusional. Thought content does not include homicidal or suicidal ideation. Thought content does not include homicidal or suicidal plan.        Cognition and Memory: Cognition and memory normal.        Judgment: Judgment normal.     Comments: Insight intact    Lab Review:     Component Value Date/Time   NA 135 12/27/2019 1112   K 3.5 12/27/2019 1112   CL  101 12/27/2019 1112   CO2 25 12/27/2019 1112   GLUCOSE 129 (H) 12/27/2019 1112   BUN 13 12/27/2019 1112   CREATININE 1.00 12/27/2019 1112   CALCIUM 8.5 (L) 12/27/2019 1112   GFRNONAA >60 12/27/2019 1112   GFRAA >60 08/12/2019 0734       Component Value Date/Time   WBC 9.9 12/27/2019 1112   RBC 4.49 12/27/2019 1112   HGB 13.1 12/27/2019 1112   HCT 38.6 (L) 12/27/2019 1112   PLT 215 12/27/2019 1112   MCV 86.0 12/27/2019 1112   MCH 29.2  12/27/2019 1112   MCHC 33.9 12/27/2019 1112   RDW 12.9 12/27/2019 1112    No results found for: POCLITH, LITHIUM   No results found for: PHENYTOIN, PHENOBARB, VALPROATE, CBMZ   .res Assessment: Plan:    Plan:  1. Lexapro 69m at hs 2. Continue Xanax 168mTID  RTC 3 months  Patient advised to contact office with any questions, adverse effects, or acute worsening in signs and symptoms.  Discussed potential benefits, risk, and side effects of benzodiazepines to include potential risk of tolerance and dependence, as well as possible drowsiness.  Advised patient not to drive if experiencing drowsiness and to take lowest possible effective dose to minimize risk of dependence and tolerance.  There are no diagnoses linked to this encounter.  Please see After Visit Summary for patient specific instructions.  No future appointments.  No orders of the defined types were placed in this encounter.     -------------------------------

## 2021-01-25 ENCOUNTER — Other Ambulatory Visit: Payer: Self-pay | Admitting: Adult Health

## 2021-01-25 DIAGNOSIS — F331 Major depressive disorder, recurrent, moderate: Secondary | ICD-10-CM

## 2021-01-25 DIAGNOSIS — F411 Generalized anxiety disorder: Secondary | ICD-10-CM

## 2021-01-25 DIAGNOSIS — F410 Panic disorder [episodic paroxysmal anxiety] without agoraphobia: Secondary | ICD-10-CM

## 2021-04-22 ENCOUNTER — Ambulatory Visit (INDEPENDENT_AMBULATORY_CARE_PROVIDER_SITE_OTHER): Payer: Self-pay | Admitting: Adult Health

## 2021-04-22 ENCOUNTER — Encounter: Payer: Self-pay | Admitting: Adult Health

## 2021-04-22 ENCOUNTER — Other Ambulatory Visit: Payer: Self-pay

## 2021-04-22 DIAGNOSIS — F422 Mixed obsessional thoughts and acts: Secondary | ICD-10-CM

## 2021-04-22 DIAGNOSIS — F411 Generalized anxiety disorder: Secondary | ICD-10-CM

## 2021-04-22 DIAGNOSIS — F410 Panic disorder [episodic paroxysmal anxiety] without agoraphobia: Secondary | ICD-10-CM

## 2021-04-22 DIAGNOSIS — F331 Major depressive disorder, recurrent, moderate: Secondary | ICD-10-CM

## 2021-04-22 MED ORDER — ESCITALOPRAM OXALATE 10 MG PO TABS: ORAL_TABLET | ORAL | 5 refills | Status: DC

## 2021-04-22 MED ORDER — ALPRAZOLAM 1 MG PO TABS: ORAL_TABLET | ORAL | 2 refills | Status: DC

## 2021-04-22 NOTE — Progress Notes (Signed)
Olden Klauer [id number] [birth year] 41 y.o.  Subjective:   Patient ID:  Chase Lee is a 41 y.o. (DOB 1980-07-29) male.  Chief Complaint: No chief complaint on file.   HPI Chase Lee presents to the office today for follow-up of OCD, MDD, panic attacks and GAD.   Describes mood today as "ok". Pleasant. Denies tearfulness. Mood symptoms - denies depression and anxiety. Irritable - "a little bit". Stating "I'm doing alright". Feels like medications continue to work well for him. He and wife doing well.  Stable interest and motivation. Taking medications as prescribed.  Energy levels stable. Active, has a regular exercise routine.  Enjoys some usual interests and activities. Married. Lives with wife. Family in [place]. Spending time with family. Appetite adequate. Weight stable - 232 pounds.  Sleeps well most nights. Averages 6 to 8 hours. Focus and concentration stable. Completing tasks. Managing aspects of household. 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  Denies AH or VH.  Previous medications: Prozac, Zoloft   Review of Systems:  Review of Systems  Musculoskeletal:  Negative for gait problem.  Neurological:  Negative for tremors.  Psychiatric/Behavioral:         Please refer to HPI   Medications: I have reviewed the patient's current medications.  Current Outpatient Medications  Medication Sig Dispense Refill   ALPRAZolam (XANAX) 1 MG tablet TAKE 1 TABLET(1 MG) BY MOUTH THREE TIMES DAILY AS NEEDED FOR ANXIETY 90 tablet 2   Buprenorphine HCl-Naloxone HCl 8-2 MG FILM Take 1 Film by mouth daily.      escitalopram (LEXAPRO) 10 MG tablet TAKE 1 TABLET(10 MG) BY MOUTH DAILY 30 tablet 5   furosemide (LASIX) 40 MG tablet TK 1 T PO D     lisinopril (ZESTRIL) 20 MG tablet TK 1 T PO QD     lisinopril (ZESTRIL) 20 MG tablet Take 1 tablet (20 mg total) by mouth daily. 30 tablet 0   omeprazole (PRILOSEC) 40 MG capsule Take 1 capsule (40 mg total) by mouth daily. 60 capsule 0    warfarin (COUMADIN) 5 MG tablet Take 5 mg by mouth. 39m mon-sat. Doesn't take on sundays.     No current facility-administered medications for this visit.    Medication Side Effects: None  Allergies: No Known Allergies  Past Medical History:  Diagnosis Date   Anxiety    History of artificial heart valve 2008   surgery at 19   Past Medical History, Surgical history, Social history, and Family history were reviewed and updated as appropriate.   Please see review of systems for further details on the patient's review from today.   Objective:   Physical Exam:  There were no vitals taken for this visit.  Physical Exam Constitutional:      General: He is not in acute distress. Musculoskeletal:        General: No deformity.  Neurological:     Mental Status: He is alert and oriented to person, place, and time.     Coordination: Coordination normal.  Psychiatric:        Attention and Perception: Attention and perception normal. He does not perceive auditory or visual hallucinations.        Mood and Affect: Mood normal. Mood is not anxious or depressed. Affect is not labile, blunt, angry or inappropriate.        Speech: Speech normal.        Behavior: Behavior normal.        Thought Content:  Thought content normal. Thought content is not paranoid or delusional. Thought content does not include homicidal or suicidal ideation. Thought content does not include homicidal or suicidal plan.        Cognition and Memory: Cognition and memory normal.        Judgment: Judgment normal.     Comments: Insight intact    Lab Review:     Component Value Date/Time   NA 135 12/27/2019 1112   K 3.5 12/27/2019 1112   CL 101 12/27/2019 1112   CO2 25 12/27/2019 1112   GLUCOSE 129 (H) 12/27/2019 1112   BUN 13 12/27/2019 1112   CREATININE 1.00 12/27/2019 1112   CALCIUM 8.5 (L) 12/27/2019 1112   GFRNONAA >60 12/27/2019 1112   GFRAA >60 08/12/2019 0734       Component Value Date/Time    WBC 9.9 12/27/2019 1112   RBC 4.49 12/27/2019 1112   HGB 13.1 12/27/2019 1112   HCT 38.6 (L) 12/27/2019 1112   PLT 215 12/27/2019 1112   MCV 86.0 12/27/2019 1112   MCH 29.2 12/27/2019 1112   MCHC 33.9 12/27/2019 1112   RDW 12.9 12/27/2019 1112    No results found for: POCLITH, LITHIUM   No results found for: PHENYTOIN, PHENOBARB, VALPROATE, CBMZ   .res Assessment: Plan:    Plan:  1. Lexapro 61m at hs 2. Continue Xanax 115mTID  RTC 3 months  Patient advised to contact office with any questions, adverse effects, or acute worsening in signs and symptoms.  Discussed potential benefits, risk, and side effects of benzodiazepines to include potential risk of tolerance and dependence, as well as possible drowsiness.  Advised patient not to drive if experiencing drowsiness and to take lowest possible effective dose to minimize risk of dependence and tolerance.   Diagnoses and all orders for this visit:  Major depressive disorder, recurrent episode, moderate (HCC) -     escitalopram (LEXAPRO) 10 MG tablet; TAKE 1 TABLET(10 MG) BY MOUTH DAILY  Generalized anxiety disorder -     ALPRAZolam (XANAX) 1 MG tablet; TAKE 1 TABLET(1 MG) BY MOUTH THREE TIMES DAILY AS NEEDED FOR ANXIETY -     escitalopram (LEXAPRO) 10 MG tablet; TAKE 1 TABLET(10 MG) BY MOUTH DAILY  Panic attacks -     ALPRAZolam (XANAX) 1 MG tablet; TAKE 1 TABLET(1 MG) BY MOUTH THREE TIMES DAILY AS NEEDED FOR ANXIETY -     escitalopram (LEXAPRO) 10 MG tablet; TAKE 1 TABLET(10 MG) BY MOUTH DAILY  Mixed obsessional thoughts and acts     Please see After Visit Summary for patient specific instructions.  No future appointments.  No orders of the defined types were placed in this encounter.   -------------------------------

## 2021-06-11 ENCOUNTER — Encounter (HOSPITAL_COMMUNITY): Payer: Self-pay

## 2021-06-11 ENCOUNTER — Other Ambulatory Visit: Payer: Self-pay

## 2021-06-11 ENCOUNTER — Inpatient Hospital Stay (HOSPITAL_COMMUNITY)
Admission: EM | Admit: 2021-06-11 | Discharge: 2021-06-13 | DRG: 392 | Disposition: A | Discharge status: Home / Self Care | Payer: Self-pay | Attending: Internal Medicine | Admitting: Internal Medicine

## 2021-06-11 ENCOUNTER — Emergency Department (HOSPITAL_COMMUNITY): Payer: Self-pay

## 2021-06-11 DIAGNOSIS — A0811 Acute gastroenteropathy due to Norwalk agent: Principal | ICD-10-CM | POA: Diagnosis present

## 2021-06-11 DIAGNOSIS — Z952 Presence of prosthetic heart valve: Secondary | ICD-10-CM

## 2021-06-11 DIAGNOSIS — F1120 Opioid dependence, uncomplicated: Secondary | ICD-10-CM | POA: Diagnosis present

## 2021-06-11 DIAGNOSIS — K219 Gastro-esophageal reflux disease without esophagitis: Secondary | ICD-10-CM | POA: Diagnosis present

## 2021-06-11 DIAGNOSIS — K529 Noninfective gastroenteritis and colitis, unspecified: Secondary | ICD-10-CM | POA: Diagnosis present

## 2021-06-11 DIAGNOSIS — F17210 Nicotine dependence, cigarettes, uncomplicated: Secondary | ICD-10-CM | POA: Diagnosis present

## 2021-06-11 DIAGNOSIS — Z6835 Body mass index (BMI) 35.0-35.9, adult: Secondary | ICD-10-CM

## 2021-06-11 DIAGNOSIS — E669 Obesity, unspecified: Secondary | ICD-10-CM | POA: Diagnosis present

## 2021-06-11 DIAGNOSIS — Z01810 Encounter for preprocedural cardiovascular examination: Secondary | ICD-10-CM

## 2021-06-11 DIAGNOSIS — K6389 Other specified diseases of intestine: Secondary | ICD-10-CM | POA: Diagnosis present

## 2021-06-11 DIAGNOSIS — I10 Essential (primary) hypertension: Secondary | ICD-10-CM | POA: Diagnosis present

## 2021-06-11 DIAGNOSIS — D6959 Other secondary thrombocytopenia: Secondary | ICD-10-CM | POA: Diagnosis present

## 2021-06-11 DIAGNOSIS — R1909 Other intra-abdominal and pelvic swelling, mass and lump: Secondary | ICD-10-CM

## 2021-06-11 DIAGNOSIS — K7581 Nonalcoholic steatohepatitis (NASH): Secondary | ICD-10-CM | POA: Diagnosis present

## 2021-06-11 DIAGNOSIS — I879 Disorder of vein, unspecified: Secondary | ICD-10-CM | POA: Diagnosis present

## 2021-06-11 DIAGNOSIS — E872 Acidosis, unspecified: Secondary | ICD-10-CM | POA: Diagnosis present

## 2021-06-11 DIAGNOSIS — Z7901 Long term (current) use of anticoagulants: Secondary | ICD-10-CM

## 2021-06-11 DIAGNOSIS — E871 Hypo-osmolality and hyponatremia: Secondary | ICD-10-CM | POA: Diagnosis present

## 2021-06-11 DIAGNOSIS — Z79899 Other long term (current) drug therapy: Secondary | ICD-10-CM

## 2021-06-11 DIAGNOSIS — Z5181 Encounter for therapeutic drug level monitoring: Secondary | ICD-10-CM

## 2021-06-11 DIAGNOSIS — R109 Unspecified abdominal pain: Secondary | ICD-10-CM

## 2021-06-11 DIAGNOSIS — F419 Anxiety disorder, unspecified: Secondary | ICD-10-CM | POA: Diagnosis present

## 2021-06-11 DIAGNOSIS — Z809 Family history of malignant neoplasm, unspecified: Secondary | ICD-10-CM

## 2021-06-11 LAB — URINALYSIS, ROUTINE W REFLEX MICROSCOPIC
Bacteria, UA: NONE SEEN
Bilirubin Urine: NEGATIVE
Glucose, UA: NEGATIVE mg/dL
Ketones, ur: 5 mg/dL — AB
Leukocytes,Ua: NEGATIVE
Nitrite: NEGATIVE
Protein, ur: 30 mg/dL — AB
Specific Gravity, Urine: 1.034 — ABNORMAL HIGH (ref 1.005–1.030)
pH: 6 (ref 5.0–8.0)

## 2021-06-11 LAB — CBC
HCT: 48.3 % (ref 39.0–52.0)
Hemoglobin: 16.3 g/dL (ref 13.0–17.0)
MCH: 29.4 pg (ref 26.0–34.0)
MCHC: 33.7 g/dL (ref 30.0–36.0)
MCV: 87.2 fL (ref 80.0–100.0)
Platelets: 186 10*3/uL (ref 150–400)
RBC: 5.54 MIL/uL (ref 4.22–5.81)
RDW: 13.4 % (ref 11.5–15.5)
WBC: 7.8 10*3/uL (ref 4.0–10.5)
nRBC: 0 % (ref 0.0–0.2)

## 2021-06-11 LAB — PROTIME-INR
INR: 2.3 — ABNORMAL HIGH (ref 0.8–1.2)
INR: 2.8 — ABNORMAL HIGH (ref 0.8–1.2)
Prothrombin Time: 25 seconds — ABNORMAL HIGH (ref 11.4–15.2)
Prothrombin Time: 29.8 seconds — ABNORMAL HIGH (ref 11.4–15.2)

## 2021-06-11 LAB — COMPREHENSIVE METABOLIC PANEL
ALT: 48 U/L — ABNORMAL HIGH (ref 0–44)
AST: 48 U/L — ABNORMAL HIGH (ref 15–41)
Albumin: 5.1 g/dL — ABNORMAL HIGH (ref 3.5–5.0)
Alkaline Phosphatase: 74 U/L (ref 38–126)
Anion gap: 9 (ref 5–15)
BUN: 19 mg/dL (ref 6–20)
CO2: 25 mmol/L (ref 22–32)
Calcium: 8.8 mg/dL — ABNORMAL LOW (ref 8.9–10.3)
Chloride: 99 mmol/L (ref 98–111)
Creatinine, Ser: 1.2 mg/dL (ref 0.61–1.24)
GFR, Estimated: >60 mL/min (ref >60)
Glucose, Bld: 155 mg/dL — ABNORMAL HIGH (ref 70–99)
Potassium: 3.8 mmol/L (ref 3.5–5.1)
Sodium: 133 mmol/L — ABNORMAL LOW (ref 135–145)
Total Bilirubin: 1.5 mg/dL — ABNORMAL HIGH (ref 0.3–1.2)
Total Protein: 8.5 g/dL — ABNORMAL HIGH (ref 6.5–8.1)

## 2021-06-11 LAB — C DIFFICILE QUICK SCREEN W PCR REFLEX
C Diff antigen: NEGATIVE
C Diff interpretation: NOT DETECTED
C Diff toxin: NEGATIVE

## 2021-06-11 LAB — GLUCOSE, CAPILLARY
Glucose-Capillary: 130 mg/dL — ABNORMAL HIGH (ref 70–99)
Glucose-Capillary: 96 mg/dL (ref 70–99)

## 2021-06-11 LAB — LIPASE, BLOOD: Lipase: 25 U/L (ref 11–51)

## 2021-06-11 LAB — LACTIC ACID, PLASMA: Lactic Acid, Venous: 1.4 mmol/L (ref 0.5–1.9)

## 2021-06-11 LAB — HEPARIN LEVEL (UNFRACTIONATED): Heparin Unfractionated: 0.13 IU/mL — ABNORMAL LOW (ref 0.30–0.70)

## 2021-06-11 MED ORDER — ACETAMINOPHEN 325 MG PO TABS: 650.0000 mg | ORAL_TABLET | Freq: Four times a day (QID) | ORAL | Status: DC | PRN

## 2021-06-11 MED ORDER — ACETAMINOPHEN 650 MG RE SUPP: 650.0000 mg | Freq: Four times a day (QID) | RECTAL | Status: DC | PRN

## 2021-06-11 MED ORDER — ONDANSETRON HCL 4 MG/2ML IJ SOLN
4.0000 mg | Freq: Once | INTRAMUSCULAR | Status: AC
  Administered 2021-06-11: 4 mg via INTRAVENOUS
  Filled 2021-06-11: qty 2

## 2021-06-11 MED ORDER — SODIUM CHLORIDE 0.9% FLUSH
3.0000 mL | Freq: Two times a day (BID) | INTRAVENOUS | Status: DC
  Administered 2021-06-11 – 2021-06-12 (×4): 3 mL via INTRAVENOUS

## 2021-06-11 MED ORDER — ONDANSETRON HCL 4 MG/2ML IJ SOLN
4.0000 mg | Freq: Four times a day (QID) | INTRAMUSCULAR | Status: DC | PRN
  Administered 2021-06-11 – 2021-06-13 (×2): 4 mg via INTRAVENOUS
  Filled 2021-06-11 (×3): qty 2

## 2021-06-11 MED ORDER — SODIUM CHLORIDE 0.9 % IV SOLN
2.0000 g | Freq: Once | INTRAVENOUS | Status: AC
  Administered 2021-06-11: 2 g via INTRAVENOUS
  Filled 2021-06-11: qty 2

## 2021-06-11 MED ORDER — KETOROLAC TROMETHAMINE 30 MG/ML IJ SOLN
30.0000 mg | Freq: Four times a day (QID) | INTRAMUSCULAR | Status: AC
  Administered 2021-06-11 – 2021-06-12 (×4): 30 mg via INTRAVENOUS
  Filled 2021-06-11 (×4): qty 1

## 2021-06-11 MED ORDER — IOHEXOL 300 MG/ML  SOLN
75.0000 mL | Freq: Once | INTRAMUSCULAR | Status: AC | PRN
  Administered 2021-06-11: 75 mL via INTRAVENOUS

## 2021-06-11 MED ORDER — HYDROMORPHONE HCL 1 MG/ML IJ SOLN
1.0000 mg | INTRAMUSCULAR | Status: DC | PRN
  Administered 2021-06-11 – 2021-06-13 (×9): 1 mg via INTRAVENOUS
  Filled 2021-06-11 (×9): qty 1

## 2021-06-11 MED ORDER — POLYETHYLENE GLYCOL 3350 17 G PO PACK: 17.0000 g | PACK | Freq: Every day | ORAL | Status: DC | PRN

## 2021-06-11 MED ORDER — HEPARIN (PORCINE) 25000 UT/250ML-% IV SOLN
1450.0000 [IU]/h | INTRAVENOUS | Status: DC
  Administered 2021-06-11: 1250 [IU]/h via INTRAVENOUS
  Administered 2021-06-12: 1400 [IU]/h via INTRAVENOUS
  Administered 2021-06-13: 1450 [IU]/h via INTRAVENOUS
  Filled 2021-06-11 (×3): qty 250

## 2021-06-11 MED ORDER — ALPRAZOLAM 0.5 MG PO TABS
0.5000 mg | ORAL_TABLET | Freq: Two times a day (BID) | ORAL | Status: DC | PRN
  Administered 2021-06-12: 0.5 mg via ORAL
  Filled 2021-06-11 (×2): qty 1

## 2021-06-11 MED ORDER — HYDROMORPHONE HCL 1 MG/ML IJ SOLN
1.0000 mg | Freq: Once | INTRAMUSCULAR | Status: AC
  Administered 2021-06-11: 1 mg via INTRAVENOUS
  Filled 2021-06-11: qty 1

## 2021-06-11 MED ORDER — METRONIDAZOLE 500 MG/100ML IV SOLN
500.0000 mg | Freq: Two times a day (BID) | INTRAVENOUS | Status: DC
Start: 2021-06-11 — End: 2021-06-13
  Administered 2021-06-11 – 2021-06-13 (×4): 500 mg via INTRAVENOUS
  Filled 2021-06-11 (×4): qty 100

## 2021-06-11 MED ORDER — BISACODYL 10 MG RE SUPP: 10.0000 mg | Freq: Every day | RECTAL | Status: DC | PRN

## 2021-06-11 MED ORDER — SODIUM CHLORIDE 0.9% FLUSH
3.0000 mL | INTRAVENOUS | Status: DC | PRN
  Administered 2021-06-11: 3 mL via INTRAVENOUS

## 2021-06-11 MED ORDER — SODIUM CHLORIDE 0.9 % IV BOLUS
1000.0000 mL | Freq: Once | INTRAVENOUS | Status: AC
  Administered 2021-06-11: 1000 mL via INTRAVENOUS

## 2021-06-11 MED ORDER — SODIUM CHLORIDE 0.9 % IV SOLN: 250.0000 mL | INTRAVENOUS | Status: DC | PRN

## 2021-06-11 MED ORDER — ESCITALOPRAM OXALATE 10 MG PO TABS
10.0000 mg | ORAL_TABLET | Freq: Every day | ORAL | Status: DC
  Administered 2021-06-12: 10 mg via ORAL
  Filled 2021-06-11 (×2): qty 1

## 2021-06-11 MED ORDER — ALPRAZOLAM 0.25 MG PO TABS
0.2500 mg | ORAL_TABLET | Freq: Two times a day (BID) | ORAL | Status: DC | PRN
  Administered 2021-06-11: 0.25 mg via ORAL
  Filled 2021-06-11: qty 1

## 2021-06-11 MED ORDER — LACTATED RINGERS IV BOLUS
1000.0000 mL | Freq: Once | INTRAVENOUS | Status: AC
  Administered 2021-06-11: 1000 mL via INTRAVENOUS

## 2021-06-11 MED ORDER — SODIUM CHLORIDE 0.9 % IV SOLN: INTRAVENOUS | Status: DC

## 2021-06-11 MED ORDER — DEXTROSE-NACL 5-0.9 % IV SOLN: INTRAVENOUS | Status: DC

## 2021-06-11 MED ORDER — SODIUM CHLORIDE 0.9 % IV SOLN
2.0000 g | INTRAVENOUS | Status: DC
  Administered 2021-06-11 – 2021-06-12 (×2): 2 g via INTRAVENOUS
  Filled 2021-06-11 (×2): qty 20

## 2021-06-11 MED ORDER — METRONIDAZOLE 500 MG/100ML IV SOLN
500.0000 mg | Freq: Once | INTRAVENOUS | Status: AC
  Administered 2021-06-11: 500 mg via INTRAVENOUS
  Filled 2021-06-11: qty 100

## 2021-06-11 NOTE — Progress Notes (Signed)
ANTICOAGULATION CONSULT NOTE -  ? ?Pharmacy Consult for Heparin ?Indication: bridge for heart valve, pt on Warfarin PTA ? ?No Known Allergies ? ?Patient Measurements: ?Height: 5' 8"  (172.7 cm) ?Weight: 105 kg (231 lb 7.7 oz) ?IBW/kg (Calculated) : 68.4 ?HEPARIN DW (KG): 91.4 ? ?Vital Signs: ?Temp: 98 ?F (36.7 ?C) (03/25 1802) ?Temp Source: Oral (03/25 1802) ?BP: 125/75 (03/25 1802) ?Pulse Rate: 55 (03/25 1802) ? ?Labs: ?Recent Labs  ?  06/11/21 ?1833 06/11/21 ?0459 06/11/21 ?1907  ?HGB 16.3  --   --   ?HCT 48.3  --   --   ?PLT 186  --   --   ?LABPROT  --  25.0* 29.8*  ?INR  --  2.3* 2.8*  ?HEPARINUNFRC  --   --  0.13*  ?CREATININE 1.20  --   --   ? ? ?Estimated Creatinine Clearance: 93.2 mL/min (by C-G formula based on SCr of 1.2 mg/dL). ? ?Assessment: ?41 y.o. male who presents with a 1 day history of bilateral lower abdominal pain.  The pain started yesterday, and was associated with nausea, vomiting, and diarrhea. Pt on Warfarin PTA w/ therapeutic INR on low end of range.  Asked to initiate Heparin as bridge as pt has h/o artificial heart valve. ? ?INR 2.8 this evening, Warfarin on hold and INR will slow trend down ?Heparin level 0.13 ? ?Goal of Therapy:  ?Heparin level 0.3-0.7 units/ml ?Monitor platelets by anticoagulation protocol: Yes ?  ?Plan:  ?Will only increase heparin slightly to 1350 units / hr ?Check HL and INR daily ?Monitor CBC, s/sx bleeding complications ? ?Tad Moore ?06/11/2021,8:19 PM ? ? ?

## 2021-06-11 NOTE — Consult Note (Signed)
[facility] Consult ? ?Reason for Consult: Colitis with portal venous gas ?Referring Physician: Lajean Saver, MD ? ?Chief Complaint   ?Abdominal Pain ?  ? ? ?HPI: Chase Lee is a 41 y.o. male who presents with a 1 day history of bilateral lower abdominal pain.  The pain started yesterday, and was associated with nausea, vomiting, and diarrhea.  He has never had anything like this before.  He describes the pain as just a constant ache in his bilateral lower quadrants.  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  He currently denies nausea, and last episode of emesis was at 3 AM.  He states he feels hungry at this time.  He has had several episodes of loose bowel movements, with the most recent being a couple of hours ago.  Patient did have a fever yesterday but has been afebrile since evaluation in the hospital.  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  He currently takes Coumadin for his mechanical heart valve.  He denies any history of abdominal surgeries.  He confirms having a colonoscopy many years ago.  He denies any recent procedures or instrumentation to his abdomen. ? ?Upon evaluation in the ED, he had no leukocytosis, WBC 7.8, normal lactic acid at 1.4, and normal vital signs.  He underwent a CT abdomen and pelvis which was concerning for ascending colitis with a possible associated mass, sigmoid colitis, and a small amount of portal venous gas.  He had no evidence of pneumatosis, other venous gas, perforation with pneumoperitoneum, or free fluid. ? ?Past Medical History:  ?Diagnosis Date  ? Anxiety   ? History of artificial heart valve 2008  ? surgery at 86  ? ? ?Past Surgical History:  ?Procedure Laterality Date  ? CARDIAC SURGERY    ? ? ?Family History  ?Problem Relation Age of Onset  ? Depression Mother   ? Cancer Mother   ?  Cancer Father   ? ? ?Social History  ? ?Tobacco Use  ? Smoking status: Former  ?  Types: Cigarettes  ?  Quit date: 08/10/2019  ?  Years since quitting: 1.8  ? Smokeless tobacco: Never  ?Vaping Use  ? Vaping Use: Never used  ?Substance Use Topics  ? Alcohol use: Never  ? Drug use: Never  ? ? ?Medications: I have reviewed the patient's current medications. ? ?No Known Allergies ? ? ?ROS:  ?Constitutional: positive for fatigue and fevers, negative for chills ?Respiratory: negative for cough and shortness of breath ?Cardiovascular: negative for chest pain and palpitations ?Gastrointestinal: positive for abdominal pain, diarrhea, nausea, and vomiting ?Musculoskeletal:negative for back pain ? ?Blood pressure 131/74, pulse 62, temperature 98 ?F (36.7 ?C), temperature source Oral, resp. rate 16, height 5' 8"  (1.727 m), weight 105 kg, SpO2 97 %. ?Physical Exam ?Vitals reviewed.  ?Constitutional:   ?   General: He is not in acute distress. ?   Appearance: He is well-developed.  ?HENT:  ?   Head: Normocephalic and atraumatic.  ?Eyes:  ?   Extraocular Movements: Extraocular movements intact.  ?   Pupils: Pupils are equal, round, and reactive to light.  ?Cardiovascular:  ?   Rate and Rhythm: Normal rate.  ?Pulmonary:  ?   Effort: Pulmonary effort is normal.  ?Abdominal:  ?   Comments: Abdomen soft, nondistended, no percussion tenderness, minimal  tenderness to palpation in bilateral lower quadrants, no rigidity, guarding, rebound tenderness  ?Skin: ?   General: Skin is warm and dry.  ?Neurological:  ?   General: No focal deficit present.  ?   Mental Status: He is alert and oriented to person, place, and time.  ?Psychiatric:     ?   Mood and Affect: Mood normal.     ?   Behavior: Behavior normal.  ? ? ?Results: ?Results for orders placed or performed during the hospital encounter of 06/11/21 (from the past 48 hour(s))  ?CBC     Status: None  ? Collection Time: 06/11/21  4:55 AM  ?Result Value Ref Range  ? WBC 7.8 4.0 - 10.5 K/uL  ?  RBC 5.54 4.22 - 5.81 MIL/uL  ? Hemoglobin 16.3 13.0 - 17.0 g/dL  ? HCT 48.3 39.0 - 52.0 %  ? MCV 87.2 80.0 - 100.0 fL  ? MCH 29.4 26.0 - 34.0 pg  ? MCHC 33.7 30.0 - 36.0 g/dL  ? RDW 13.4 11.5 - 15.5 %  ? Platelets 186 150 - 400 K/uL  ? nRBC 0.0 0.0 - 0.2 %  ?  Comment: Performed at [facility], [address], [place], [place] [address]  ?Comprehensive metabolic panel     Status: Abnormal  ? Collection Time: 06/11/21  4:55 AM  ?Result Value Ref Range  ? Sodium 133 (L) 135 - 145 mmol/L  ? Potassium 3.8 3.5 - 5.1 mmol/L  ? Chloride 99 98 - 111 mmol/L  ? CO2 25 22 - 32 mmol/L  ? Glucose, Bld 155 (H) 70 - 99 mg/dL  ?  Comment: Glucose reference range applies only to samples taken after fasting for at least 8 hours.  ? BUN 19 6 - 20 mg/dL  ? Creatinine, Ser 1.20 0.61 - 1.24 mg/dL  ? Calcium 8.8 (L) 8.9 - 10.3 mg/dL  ? Total Protein 8.5 (H) 6.5 - 8.1 g/dL  ? Albumin 5.1 (H) 3.5 - 5.0 g/dL  ? AST 48 (H) 15 - 41 U/L  ? ALT 48 (H) 0 - 44 U/L  ? Alkaline Phosphatase 74 38 - 126 U/L  ? Total Bilirubin 1.5 (H) 0.3 - 1.2 mg/dL  ? GFR, Estimated >60 >60 mL/min  ?  Comment: (NOTE) ?Calculated using the CKD-EPI Creatinine Equation (2021) ?  ? Anion gap 9 5 - 15  ?  Comment: Performed at [facility], [address], [place], [place] [address]  ?Lipase, blood     Status: None  ? Collection Time: 06/11/21  4:55 AM  ?Result Value Ref Range  ? Lipase 25 11 - 51 U/L  ?  Comment: Performed at [facility], [address], [place], [place] [address]  ?Protime-INR     Status: Abnormal  ? Collection Time: 06/11/21  4:59 AM  ?Result Value Ref Range  ? Prothrombin Time 25.0 (H) 11.4 - 15.2 seconds  ? INR 2.3 (H) 0.8 - 1.2  ?  Comment: (NOTE) ?INR goal varies based on device and disease states. ?Performed at [facility], [address] [place], [place] [address] ?  ?Urinalysis, Routine w reflex microscopic Urine, Clean Catch     Status: Abnormal  ? Collection Time: 06/11/21  6:40 AM  ?Result Value Ref Range  ? Color, Urine AMBER (A) YELLOW  ?   Comment: BIOCHEMICALS MAY BE AFFECTED BY COLOR  ? APPearance CLEAR CLEAR  ? Specific Gravity, Urine 1.034 (H) 1.005 - 1.030  ? pH 6.0 5.0 - 8.0  ?  Glucose, UA NEGATIVE NEGATIVE mg/dL  ? Hgb urine dipstick SMALL (A) NEGATIVE  ? Bilirubin Urine NEGATIVE NEGATIVE  ? Ketones, ur 5 (A) NEGATIVE mg/dL  ? Protein, ur 30 (A) NEGATIVE mg/dL  ? Nitrite NEGATIVE NEGATIVE  ? Leukocytes,Ua NEGATIVE NEGATIVE  ? RBC / HPF 6-10 0 - 5 RBC/hpf  ? WBC, UA 0-5 0 - 5 WBC/hpf  ? Bacteria, UA NONE SEEN NONE SEEN  ? Mucus PRESENT   ?  Comment: Performed at [facility], [address] [place], [place] [address]  ?Lactic acid, plasma     Status: None  ? Collection Time: 06/11/21  7:54 AM  ?Result Value Ref Range  ? Lactic Acid, Venous 1.4 0.5 - 1.9 mmol/L  ?  Comment: Performed at [facility], [address] [place], [place] [address]  ? ? ?CT Abdomen Pelvis W Contrast ? ?Result Date: 06/11/2021 ?CLINICAL DATA:  41 year old male with history of right lower quadrant abdominal pain and sharp epigastric pain. EXAM: CT ABDOMEN AND PELVIS WITH CONTRAST TECHNIQUE: Multidetector CT imaging of the abdomen and pelvis was performed using the standard protocol following bolus administration of intravenous contrast. RADIATION DOSE REDUCTION: This exam was performed according to the departmental dose-optimization program which includes automated exposure control, adjustment of the mA and/or kV according to patient size and/or use of iterative reconstruction technique. CONTRAST:  29m OMNIPAQUE IOHEXOL 300 MG/ML  SOLN COMPARISON:  No priors. 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 Hepatobiliary: There is a small amount of intrahepatic gas which is evident in the superior aspect of the left lobe of the liver, particularly notable adjacent to the left  hepatic vein. On coronal views this appears to be rather peripheral in the liver, favored to be portal venous gas. Mild diffuse low attenuation throughout the hepatic parenchyma, indicative of a backgrou

## 2021-06-11 NOTE — ED Notes (Signed)
Pt returned from CT Scan 

## 2021-06-11 NOTE — Progress Notes (Signed)
ANTICOAGULATION CONSULT NOTE - Initial Consult ? ?Pharmacy Consult for Heparin ?Indication: bridge for heart valve, pt on Warfarin ? ?No Known Allergies ? ?Patient Measurements: ?Height: 5' 8"  (172.7 cm) ?Weight: 105 kg (231 lb 7.7 oz) ?IBW/kg (Calculated) : 68.4 ?HEPARIN DW (KG): 91.4 ? ?Vital Signs: ?Temp: 98 ?F (36.7 ?C) (03/25 0439) ?Temp Source: Oral (03/25 0439) ?BP: 127/82 (03/25 1300) ?Pulse Rate: 55 (03/25 1300) ? ?Labs: ?Recent Labs  ?  06/11/21 ?0455 06/11/21 ?0459  ?HGB 16.3  --   ?HCT 48.3  --   ?PLT 186  --   ?LABPROT  --  25.0*  ?INR  --  2.3*  ?CREATININE 1.20  --   ? ?Estimated Creatinine Clearance: 93.2 mL/min (by C-G formula based on SCr of 1.2 mg/dL). ? ?Medical History: ?Past Medical History:  ?Diagnosis Date  ? Anxiety   ? History of artificial heart valve 2008  ? surgery at 46  ? ?Medications:  ?(Not in a hospital admission)  ? ?Assessment: ?41 y.o. male who presents with a 1 day history of bilateral lower abdominal pain.  The pain started yesterday, and was associated with nausea, vomiting, and diarrhea. Pt on Warfarin PTA w/ therapeutic INR on low end of range.  Asked to initiate Heparin as bridge as pt has h/o artificial heart valve. ? ?Goal of Therapy:  ?Heparin level 0.3-0.7 units/ml ?Monitor platelets by anticoagulation protocol: Yes ?  ?Plan:  ?Heparin infusion at 1250 units/hr, No Bolus ?No warfarin to be given ?Check HL in 6 hours w/ INR ?Check HL and INR daily ?Monitor CBC, s/sx bleeding complications ? ?Hart Robinsons A ?06/11/2021,1:33 PM ? ? ?

## 2021-06-11 NOTE — ED Notes (Signed)
Patient transported to CT 

## 2021-06-11 NOTE — Consult Note (Signed)
Maylon Peppers, M.D. ?Gastroenterology & Hepatology ? ?                                        ? ?Patient Name: Chase Lee Account #: @FLAACCTNO @   ?MRN: [id number] Admission Date: 06/11/2021 ?Date of Evaluation:  06/11/2021 Time of Evaluation: 9:57 AM ? ? ?Referring Physician: Roxan Hockey, Md ? ?Chief Complaint: Abdominal pain, diarrhea, nausea and vomiting, presence of portal air ? ?HPI:  This is a 41 y.o. male with history of anxiety and aortic valve replacement on Coumadin, who came to the hospital for evaluation of acute onset of abdominal pain, diarrhea, nausea, vomiting.  Gastroenterology was consulted as patient also was found to have free portal air on CT scan. ? ?Patient reports that yesterday he presented acute onset of multiple episodes of watery diarrhea along with abdominal pain diffusely but worse in the left side of his abdomen, specifically in the left flank.  He reported feeling well prior to the onset of his symptoms.  Stated that he felt chills and believes he was febrile but is not sure of this.  Also presented multiple episodes of bilious vomiting through the day and decided to come to the ER today as his symptoms did not improve.  He describes having severe pain in his abdomen after his symptoms started.  Patient denies taking any NSAIDs.  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 ? ?Patient denies any history of gastrointestinal diseases or malignancies in his family.  He smokes 1 pack every couple of days.  Does not use any drugs. ? ?He has never had a colonoscopy in the past. ? ?In the ED, he was HD stable and afebrile. Labs were remarkable for normal CBC we will also count of 7.8, CMP showed mild elevation of AST of 48, ALT of 48, total bilirubin was mildly elevated at 1.5, alkaline phosphatase was normal 74, normal electrolytes and renal function, normal lipase of 25, INR was 2.3. Lactic acid was normal at 1.4.  CT of  the abdomen and pelvis with IV contrast was performed in the early morning which showed presence of small amount of portal venous gas in the left lobe of the liver.  There was also presence of multiple areas of mural thickening and mucosal hyperenhancement in the ascending and sigmoid colon without presence of pneumatosis.  There was also presence of a masslike area in the ascending colon. ? ?Due to these findings, surgery was consulted by the ER.  I discussed the case with Dr. Okey Dupre, no need for surgical intervention is warranted at this point given benign exam and normal lactic acid. ? ? ?Past Medical History: SEE CHRONIC ISSSUES: ?Past Medical History:  ?Diagnosis Date  ? Anxiety   ? History of artificial heart valve 2008  ? surgery at 16  ? ?Past Surgical History:  ?Past Surgical History:  ?Procedure Laterality Date  ? CARDIAC SURGERY    ? ?Family History:  ?Family History  ?Problem Relation Age of Onset  ? Depression Mother   ? Cancer Mother   ? Cancer Father   ? ?Social History:  ?Social History  ? ?Tobacco Use  ? Smoking status: Former  ?  Types: Cigarettes  ?  Quit date: 08/10/2019  ?  Years since quitting: 1.8  ? Smokeless tobacco: Never  ?Vaping Use  ?  Vaping Use: Never used  ?Substance Use Topics  ? Alcohol use: Never  ? Drug use: Never  ? ? ?Home Medications:  ?Prior to Admission medications   ?Medication Sig Start Date End Date Taking? Authorizing Provider  ?ALPRAZolam (XANAX) 1 MG tablet TAKE 1 TABLET(1 MG) BY MOUTH THREE TIMES DAILY AS NEEDED FOR ANXIETY 04/22/21   Mozingo, Berdie Ogren, NP  ?Buprenorphine HCl-Naloxone HCl 8-2 MG FILM Take 1 Film by mouth daily.     [provider]  ?escitalopram (LEXAPRO) 10 MG tablet TAKE 1 TABLET(10 MG) BY MOUTH DAILY 04/22/21   Mozingo, Berdie Ogren, NP  ?furosemide (LASIX) 40 MG tablet TK 1 T PO D 01/04/19   [provider]  ?lisinopril (ZESTRIL) 20 MG tablet TK 1 T PO QD 11/06/18   [provider]  ?lisinopril (ZESTRIL) 20 MG  tablet Take 1 tablet (20 mg total) by mouth daily. 10/29/19   Mozingo, Berdie Ogren, NP  ?omeprazole (PRILOSEC) 40 MG capsule Take 1 capsule (40 mg total) by mouth daily. 04/28/19   Emerson Monte, FNP  ?warfarin (COUMADIN) 5 MG tablet Take 5 mg by mouth. 65m mon-sat. Doesn't take on sundays. 12/23/18   [provider]  ?  ?Inpatient Medications:  ?Current Facility-Administered Medications:  ?  cefTRIAXone (ROCEPHIN) 2 g in sodium chloride 0.9 % 100 mL IVPB, 2 g, Intravenous, Q24H, Emokpae, Courage, MD ?  HYDROmorphone (DILAUDID) injection 1 mg, 1 mg, Intravenous, Q3H PRN, Emokpae, Courage, MD ?  metroNIDAZOLE (FLAGYL) IVPB 500 mg, 500 mg, Intravenous, Q12H, Emokpae, Courage, MD ?  ondansetron (ZOFRAN) injection 4 mg, 4 mg, Intravenous, Q6H PRN, ERoxan Hockey MD ? ?Current Outpatient Medications:  ?  ALPRAZolam (XANAX) 1 MG tablet, TAKE 1 TABLET(1 MG) BY MOUTH THREE TIMES DAILY AS NEEDED FOR ANXIETY, Disp: 90 tablet, Rfl: 2 ?  Buprenorphine HCl-Naloxone HCl 8-2 MG FILM, Take 1 Film by mouth daily. , Disp: , Rfl:  ?  escitalopram (LEXAPRO) 10 MG tablet, TAKE 1 TABLET(10 MG) BY MOUTH DAILY, Disp: 30 tablet, Rfl: 5 ?  furosemide (LASIX) 40 MG tablet, TK 1 T PO D, Disp: , Rfl:  ?  lisinopril (ZESTRIL) 20 MG tablet, TK 1 T PO QD, Disp: , Rfl:  ?  lisinopril (ZESTRIL) 20 MG tablet, Take 1 tablet (20 mg total) by mouth daily., Disp: 30 tablet, Rfl: 0 ?  omeprazole (PRILOSEC) 40 MG capsule, Take 1 capsule (40 mg total) by mouth daily., Disp: 60 capsule, Rfl: 0 ?  warfarin (COUMADIN) 5 MG tablet, Take 5 mg by mouth. 541mmon-sat. Doesn't take on sundays., Disp: , Rfl:  ?Allergies: Patient has no known allergies. ? ?Complete Review of Systems: ?GENERAL: negative for malaise, night sweats ?HEENT: No changes in hearing or vision, no nose bleeds or other nasal problems. ?NECK: Negative for lumps, goiter, pain and significant neck swelling ?RESPIRATORY: Negative for cough, wheezing ?CARDIOVASCULAR: Negative for  chest pain, leg swelling, palpitations, orthopnea ?GI: SEE HPI ?MUSCULOSKELETAL: Negative for joint pain or swelling, back pain, and muscle pain. ?SKIN: Negative for lesions, rash ?PSYCH: Negative for sleep disturbance, mood disorder and recent psychosocial stressors. ?HEMATOLOGY Negative for prolonged bleeding, bruising easily, and swollen nodes. ?ENDOCRINE: Negative for cold or heat intolerance, polyuria, polydipsia and goiter. ?NEURO: negative for tremor, gait imbalance, syncope and seizures. ?The remainder of the review of systems is noncontributory. ? ?Physical Exam: ?BP 131/74 (BP Location: Left Arm)   Pulse 62   Temp 98 ?F (36.7 ?C) (Oral)   Resp 16   Ht  5' 8"  (1.727 m)   Wt 105 kg   SpO2 97%   BMI 35.20 kg/m?  ?GENERAL: The patient is AO x3, in no acute distress. ?HEENT: Head is normocephalic and atraumatic. EOMI are intact. Mouth is well hydrated and without lesions. ?NECK: Supple. No masses ?LUNGS: Clear to auscultation. No presence of rhonchi/wheezing/rales. Adequate chest expansion ?HEART: RRR, normal s1 and s2. ?ABDOMEN: tender to palpation diffusely but more significant in the epigastric area however abdomen is very soft with no guarding, no peritoneal signs, and nondistended. BS +. No masses. ?EXTREMITIES: Without any cyanosis, clubbing, rash, lesions or edema. ?NEUROLOGIC: AOx3, no focal motor deficit. ?SKIN: no jaundice, no rashes ? ?Laboratory Data ?CBC:  ?   ?Component Value Date/Time  ? WBC 7.8 06/11/2021 0455  ? RBC 5.54 06/11/2021 0455  ? HGB 16.3 06/11/2021 0455  ? HCT 48.3 06/11/2021 0455  ? PLT 186 06/11/2021 0455  ? MCV 87.2 06/11/2021 0455  ? MCH 29.4 06/11/2021 0455  ? MCHC 33.7 06/11/2021 0455  ? RDW 13.4 06/11/2021 0455  ? ?COAG:  ?Lab Results  ?Component Value Date  ? INR 2.3 (H) 06/11/2021  ? INR 2.2 (H) 12/27/2019  ? INR 2.1 (H) 08/12/2019  ?  ?BMP:  ? ?  Latest Ref Rng & Units 06/11/2021  ?  4:55 AM 12/27/2019  ? 11:12 AM 08/12/2019  ?  7:34 AM  ?BMP  ?Glucose 70 - 99 mg/dL 155    129   179    ?BUN 6 - 20 mg/dL 19   13   11     ?Creatinine 0.61 - 1.24 mg/dL 1.20   1.00   0.86    ?Sodium 135 - 145 mmol/L 133   135   136    ?Potassium 3.5 - 5.1 mmol/L 3.8   3.5   3.5    ?Chloride 98 - 111 mmol/

## 2021-06-11 NOTE — ED Provider Notes (Addendum)
?[facility] ?Provider Note ? ? ?CSN: [id number] ?Arrival date & time: 06/11/21  4431 ? ?  ? ?History ? ?Chief Complaint  ?Patient presents with  ? Abdominal Pain  ? ? ?Chase Lee is a 41 y.o. male. ? ?Patient c/o pain to right side of abdomen. Symptoms acute onset yesterday afternoon, constant, dull, moderate, non radiating. No hx same pain. A few episodes of nausea, vomiting and diarrhea since onset. No bloody or bilious emesis. No hx same pain. No prior abd surgery. No hx gallstones or kidney stones. No hematuria or dysuria. No scrotal or testicular pain or swelling. No back pain. No fever or chills.  ? ?The history is provided by the patient, the spouse and medical records.  ?Abdominal Pain ?Associated symptoms: diarrhea and vomiting   ?Associated symptoms: no chest pain, no cough, no dysuria, no fever, no shortness of breath and no sore throat   ? ?  ? ?Home Medications ?Prior to Admission medications   ?Medication Sig Start Date End Date Taking? Authorizing Provider  ?ALPRAZolam (XANAX) 1 MG tablet TAKE 1 TABLET(1 MG) BY MOUTH THREE TIMES DAILY AS NEEDED FOR ANXIETY 04/22/21   Mozingo, Berdie Ogren, NP  ?Buprenorphine HCl-Naloxone HCl 8-2 MG FILM Take 1 Film by mouth daily.     [provider]  ?escitalopram (LEXAPRO) 10 MG tablet TAKE 1 TABLET(10 MG) BY MOUTH DAILY 04/22/21   Mozingo, Berdie Ogren, NP  ?furosemide (LASIX) 40 MG tablet TK 1 T PO D 01/04/19   [provider]  ?lisinopril (ZESTRIL) 20 MG tablet TK 1 T PO QD 11/06/18   [provider]  ?lisinopril (ZESTRIL) 20 MG tablet Take 1 tablet (20 mg total) by mouth daily. 10/29/19   Mozingo, Berdie Ogren, NP  ?omeprazole (PRILOSEC) 40 MG capsule Take 1 capsule (40 mg total) by mouth daily. 04/28/19   Emerson Monte, FNP  ?warfarin (COUMADIN) 5 MG tablet Take 5 mg by mouth. 74m mon-sat. Doesn't take on sundays. 12/23/18   [provider]  ?   ? ?Allergies    ?Patient has no known allergies.    ? ?Review of Systems   ?Review of Systems  ?Constitutional:  Negative for fever.  ?HENT:  Negative for sore throat.   ?Respiratory:  Negative for cough and shortness of breath.   ?Cardiovascular:  Negative for chest pain.  ?Gastrointestinal:  Positive for abdominal pain, diarrhea and vomiting.  ?Genitourinary:  Negative for dysuria and testicular pain.  ?Musculoskeletal:  Negative for back pain.  ?Skin:  Negative for rash.  ? ?Physical Exam ?Updated Vital Signs ?BP (!) 168/87 (BP Location: Left Arm)   Pulse (!) 58   Temp 98 ?F (36.7 ?C) (Oral)   Resp 18   Ht 1.727 m (5' 8" )   Wt 105 kg   SpO2 92%   BMI 35.20 kg/m?  ?Physical Exam ?Vitals and nursing note reviewed.  ?Constitutional:   ?   Appearance: Normal appearance. He is well-developed.  ?HENT:  ?   Head: Atraumatic.  ?   Nose: Nose normal.  ?   Mouth/Throat:  ?   Mouth: Mucous membranes are moist.  ?Eyes:  ?   General: No scleral icterus. ?   Conjunctiva/sclera: Conjunctivae normal.  ?Neck:  ?   Trachea: No tracheal deviation.  ?Cardiovascular:  ?   Rate and Rhythm: Normal rate and regular rhythm.  ?   Pulses: Normal pulses.  ?   Heart sounds: No murmur heard. ?  No friction rub. No  gallop.  ?   Comments: Mech valve sounds ?Pulmonary:  ?   Effort: Pulmonary effort is normal. No accessory muscle usage or respiratory distress.  ?   Breath sounds: Normal breath sounds.  ?Abdominal:  ?   General: Bowel sounds are normal. There is no distension.  ?   Palpations: Abdomen is soft. There is no mass.  ?   Tenderness: There is abdominal tenderness. There is no guarding or rebound.  ?   Hernia: No hernia is present.  ?   Comments: Right abd tenderness  ?Genitourinary: ?   Comments: No cva tenderness. ?Musculoskeletal:     ?   General: No swelling or tenderness.  ?   Cervical back: Normal range of motion and neck supple. No rigidity.  ?   Right lower leg: No edema.  ?   Left lower leg: No edema.  ?Skin: ?   General: Skin is warm and dry.  ?   Findings: No rash.   ?Neurological:  ?   Mental Status: He is alert.  ?   Comments: Alert, speech clear.   ?Psychiatric:     ?   Mood and Affect: Mood normal.  ? ? ?ED Results / Procedures / Treatments   ?Labs ?(all labs ordered are listed, but only abnormal results are displayed) ?Results for orders placed or performed during the hospital encounter of 06/11/21  ?CBC  ?Result Value Ref Range  ? WBC 7.8 4.0 - 10.5 K/uL  ? RBC 5.54 4.22 - 5.81 MIL/uL  ? Hemoglobin 16.3 13.0 - 17.0 g/dL  ? HCT 48.3 39.0 - 52.0 %  ? MCV 87.2 80.0 - 100.0 fL  ? MCH 29.4 26.0 - 34.0 pg  ? MCHC 33.7 30.0 - 36.0 g/dL  ? RDW 13.4 11.5 - 15.5 %  ? Platelets 186 150 - 400 K/uL  ? nRBC 0.0 0.0 - 0.2 %  ?Comprehensive metabolic panel  ?Result Value Ref Range  ? Sodium 133 (L) 135 - 145 mmol/L  ? Potassium 3.8 3.5 - 5.1 mmol/L  ? Chloride 99 98 - 111 mmol/L  ? CO2 25 22 - 32 mmol/L  ? Glucose, Bld 155 (H) 70 - 99 mg/dL  ? BUN 19 6 - 20 mg/dL  ? Creatinine, Ser 1.20 0.61 - 1.24 mg/dL  ? Calcium 8.8 (L) 8.9 - 10.3 mg/dL  ? Total Protein 8.5 (H) 6.5 - 8.1 g/dL  ? Albumin 5.1 (H) 3.5 - 5.0 g/dL  ? AST 48 (H) 15 - 41 U/L  ? ALT 48 (H) 0 - 44 U/L  ? Alkaline Phosphatase 74 38 - 126 U/L  ? Total Bilirubin 1.5 (H) 0.3 - 1.2 mg/dL  ? GFR, Estimated >60 >60 mL/min  ? Anion gap 9 5 - 15  ?Lipase, blood  ?Result Value Ref Range  ? Lipase 25 11 - 51 U/L  ?Urinalysis, Routine w reflex microscopic Urine, Clean Catch  ?Result Value Ref Range  ? Color, Urine AMBER (A) YELLOW  ? APPearance CLEAR CLEAR  ? Specific Gravity, Urine 1.034 (H) 1.005 - 1.030  ? pH 6.0 5.0 - 8.0  ? Glucose, UA NEGATIVE NEGATIVE mg/dL  ? Hgb urine dipstick SMALL (A) NEGATIVE  ? Bilirubin Urine NEGATIVE NEGATIVE  ? Ketones, ur 5 (A) NEGATIVE mg/dL  ? Protein, ur 30 (A) NEGATIVE mg/dL  ? Nitrite NEGATIVE NEGATIVE  ? Leukocytes,Ua NEGATIVE NEGATIVE  ? RBC / HPF 6-10 0 - 5 RBC/hpf  ? WBC, UA 0-5 0 - 5 WBC/hpf  ?  Bacteria, UA NONE SEEN NONE SEEN  ? Mucus PRESENT   ?Protime-INR  ?Result Value Ref Range  ?  Prothrombin Time 25.0 (H) 11.4 - 15.2 seconds  ? INR 2.3 (H) 0.8 - 1.2  ? ?EKG ?EKG Interpretation ? ?Date/Time:  Saturday June 11 2021 04:51:19 EDT ?Ventricular Rate:  60 ?PR Interval:  164 ?QRS Duration: 114 ?QT Interval:  448 ?QTC Calculation: 448 ?R Axis:   24 ?Text Interpretation: Sinus or ectopic atrial rhythm Incomplete left bundle branch block Non-specific ST-t changes `similar st appearance to prior ecgs Confirmed by Lajean Saver [phone]) on 06/11/2021 5:19:12 AM ? ?Radiology ?CT Abdomen Pelvis W Contrast ? ?Result Date: 06/11/2021 ?CLINICAL DATA:  41 year old male with history of right lower quadrant abdominal pain and sharp epigastric pain. EXAM: CT ABDOMEN AND PELVIS WITH CONTRAST TECHNIQUE: Multidetector CT imaging of the abdomen and pelvis was performed using the standard protocol following bolus administration of intravenous contrast. RADIATION DOSE REDUCTION: This exam was performed according to the departmental dose-optimization program which includes automated exposure control, adjustment of the mA and/or kV according to patient size and/or use of iterative reconstruction technique. CONTRAST:  82m OMNIPAQUE IOHEXOL 300 MG/ML  SOLN COMPARISON:  No priors. 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 Hepatobiliary: There is a small amount of intrahepatic gas which is evident in the superior aspect of the left lobe of the liver, particularly notable adjacent to the left hepatic vein. On coronal views this appears to be rather peripheral in the liver, favored to be portal venous gas. Mild diffuse low attenuation throughout the hepatic parenchyma, indicative of a background of hepatic steatosis. No focal cystic or solid hepatic lesions. No intra or extrahepatic biliary ductal dilatation. Gallbladder is  normal in appearance. Pancreas: No pancreatic mass. No pancreatic ductal dilatation. No pancreatic or peripancreatic fluid collections or inflammatory changes. Spleen: Unremarkable. Adrenals/Urinary Tract: Subcentimeter low-att

## 2021-06-11 NOTE — ED Triage Notes (Signed)
Pt arrived via POV c/o sharp epigastric pain that began at apprx 1400 Friday. Pt has been constant and denies having this before. Pt endorses N/V/D. Pt reports pain radiates to right flank. ?

## 2021-06-11 NOTE — H&P (Addendum)
?                                                                                           ? ? ? ? Patient Demographics:  ? ? ?Chase Lee, is a 41 y.o. male  MRN: [id number]   DOB - February 15, 1981 ? ?Admit Date - 06/11/2021 ? ?Outpatient Primary MD for the patient is Leonie Douglas, MD ? ? Assessment & Plan:  ? ?Assessment and Plan: ?Problem  ?Colitis  ?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  ?Disorder of hepatic portal vein/Small amount of portal venous gas in the left lobe of the liver  ?H/o Opiate Dependence/ On Suboxone maintenance therapy  ?Status post mechanical aortic valve replacement at Age 60  ? ? ? ? ?1)Acute colitis--CT abdomen findings appreciated ?-Lactic acidosis not elevated ?-General surgery and GI consults appreciated ?-General surgeon recommends n.p.o. status for now ?-Patient started on IV Rocephin and Flagyl ?-Stool for C. difficile and GI pathogen/culture pending ?-IV Toradol and Dilaudid judiciously ?-IV Zofran as needed ? ?2)Possible mass of the ascending colon--- defer timing of colonoscopy to rule out malignancy to GI team ? ?3) status post prior mechanical valve placement at age 49-- ?-PTA patient was on Coumadin INR was 2.3 ?-Placed on IV heparin drip/bridge for now just in case patient needs surgical/GI interventions or procedures ? ?4)Small amount of portal venous gas in the left lobe of the liver----??? Significance ?-On imaging studies mentioned mesenteric arteries appear widely patent ?-VTE less likely given therapeutic INR ?-GI and general surgery consult appreciated ? ?5) history of opiate dependence currently on Suboxone maintenance therapy----prior to admission patient was trying to wean himself off the Suboxone ?- ?Be judicious with opiates ? ?6)FEN--- hyponatremia noted,  ?-currently n.p.o., D5 normal saline with Accu-Cheks pending resumption of oral intake ? ? ?Disposition/Need for in-Hospital Stay- patient  unable to be discharged at this time due to --- acute colitis requiring IV antibiotics and IV fluids pending improvement in bowel situation and tolerance of oral intake* ? ?Status is: Inpatient ? ?Dispo: The patient is from: Home ?             Anticipated d/c is to: Home ?             Anticipated d/c date is: 3 days ?             Patient currently is not medically stable to d/c. ?Barriers: Not Clinically Stable-  ? ? ?With History of - ?Reviewed by me ? ?Past Medical History:  ?Diagnosis Date  ? Anxiety   ? History of artificial heart valve 2008  ? surgery at 68  ?   ? ?Past Surgical History:  ?Procedure Laterality Date  ? CARDIAC SURGERY    ? ? ? ? ?Chief Complaint  ?Patient presents with  ? Abdominal Pain  ?  ? ? HPI:  ? ? Chase Lee  is a 41 y.o. male reformed smoker with history of opiate dependence currently on Suboxone maintenance therapy, history of aortic valve replacement with mechanical valve at age 1 on chronic Coumadin therapy, HTN and anxiety disorder as well as GERD who presents  to the ED with concerns about persistent abdominal pain since about 2 PM on 06/10/2021 associated with nausea and vomiting, emesis was at times bilious but no blood ?-Patient also had diarrhea ?No fever  Or chills  ?-In ED CT suggest colitis with possible ascending colon mass and Small amount of portal venous gas in the left lobe of the liver---??  Significance of this finding ?-General surgery and GI consult requested, radiology reread patient's films ?- ?Patient's wife provided additional information ?-DD UA with ketonuria and proteinuria, and elevated specific gravity ?-Lactic acid 1.4, INR 2.3 ?-CBC WNL ?-Na - 133,  ?-Creatinine 1.20 ?AST 48 ALT 14 8 and alk phos 74 with T. bili of 1.5 ? ? ? Review of systems:  ?  ?In addition to the HPI above,  ? ?A full Review of  Systems was done, all other systems reviewed are negative except as noted above in HPI , . ? ? ? Social History:  ?Reviewed by me ? ?  ?Social History   ? ?Tobacco Use  ? Smoking status: Former  ?  Types: Cigarettes  ?  Quit date: 08/10/2019  ?  Years since quitting: 1.8  ? Smokeless tobacco: Never  ?Substance Use Topics  ? Alcohol use: Never  ? ? ? ? ? Family History :  ?Reviewed by me ? ?  ?Family History  ?Problem Relation Age of Onset  ? Depression Mother   ? Cancer Mother   ? Cancer Father   ? ? ? Home Medications:  ? ?Prior to Admission medications   ?Medication Sig Start Date End Date Taking? Authorizing Provider  ?albuterol (VENTOLIN HFA) 108 (90 Base) MCG/ACT inhaler WITH SPACER - 1 puff as needed for cough 05/16/21  Yes [provider]  ?ALPRAZolam (XANAX) 1 MG tablet TAKE 1 TABLET(1 MG) BY MOUTH THREE TIMES DAILY AS NEEDED FOR ANXIETY 04/22/21  Yes Mozingo, Berdie Ogren, NP  ?buprenorphine (SUBUTEX) 8 MG SUBL SL tablet Place 8 mg under the tongue See admin instructions. Take 1/8 in mornng and 1/8 every evening   Yes [provider]  ?escitalopram (LEXAPRO) 10 MG tablet TAKE 1 TABLET(10 MG) BY MOUTH DAILY 04/22/21  Yes Mozingo, Berdie Ogren, NP  ?furosemide (LASIX) 40 MG tablet Take 40 mg by mouth daily. 01/04/19  Yes [provider]  ?lisinopril (ZESTRIL) 20 MG tablet Take 20 mg by mouth daily. 11/06/18  Yes [provider]  ?omeprazole (PRILOSEC) 40 MG capsule Take 1 capsule (40 mg total) by mouth daily. 04/28/19  Yes Avegno, Darrelyn Hillock, FNP  ?warfarin (COUMADIN) 2 MG tablet Take 2 mg by mouth See admin instructions. Take with 2.5 mg for total of 4.5 at bedtime 06/01/21  Yes [provider]  ?warfarin (COUMADIN) 2.5 MG tablet Take 2.5 mg by mouth See admin instructions. Take 2 mg with 2.5 mg at bedtime  for a total of 4.5 mg daily 06/17/20  Yes [provider]  ?lisinopril (ZESTRIL) 20 MG tablet Take 1 tablet (20 mg total) by mouth daily. 10/29/19   Mozingo, Berdie Ogren, NP  ? ? ? Allergies:  ? ? No Known Allergies ? ? Physical Exam:  ? ?Vitals ? ?Blood pressure (!) 132/95, pulse (!) 59, temperature  98.1 ?F (36.7 ?C), temperature source Oral, resp. rate 18, height _0  (1.727 m), weight 105 kg, SpO2 100 %. ? ?Physical Examination: General appearance - alert and in no distress ?Mental status - alert, oriented to person, place, and time,  ?Eyes - sclera anicteric ?Neck -  supple, no JVD elevation , ?Chest - clear  to auscultation bilaterally, symmetrical air movement,  ?Heart - S1 and S2 normal, regular , metallic valve click ?Abdomen - soft, nondistended, mild generalized abdominal discomfort to palpation without significant rebound or guarding neurological - screening mental status exam normal, neck supple without rigidity, cranial nerves II through XII intact, DTR's normal and symmetric ?Extremities - no pedal edema noted, intact peripheral pulses  ?Skin - warm, dry ? ? ? ? Data Review:  ? ? CBC ?Recent Labs  ?Lab 06/11/21 ?5997  ?WBC 7.8  ?HGB 16.3  ?HCT 48.3  ?PLT 186  ?MCV 87.2  ?MCH 29.4  ?MCHC 33.7  ?RDW 13.4  ? ?------------------------------------------------------------------------------------------------------------------ ? ?Chemistries  ?Recent Labs  ?Lab 06/11/21 ?7414  ?NA 133*  ?K 3.8  ?CL 99  ?CO2 25  ?GLUCOSE 155*  ?BUN 19  ?CREATININE 1.20  ?CALCIUM 8.8*  ?AST 48*  ?ALT 48*  ?ALKPHOS 74  ?BILITOT 1.5*  ? ?------------------------------------------------------------------------------------------------------------------ ?estimated creatinine clearance is 93.2 mL/min (by C-G formula based on SCr of 1.2 mg/dL). ?------------------------------------------------------------------------------------------------------------------ ?No results for input(s): TSH, T4TOTAL, T3FREE, THYROIDAB in the last 72 hours. ? ?Invalid input(s): FREET3 ? ? ?Coagulation profile ?Recent Labs  ?Lab 06/11/21 ?2395  ?INR 2.3*  ? ?------------------------------------------------------------------------------------------------------------------- ?No results for input(s): DDIMER in the last 72  hours. ?------------------------------------------------------------------------------------------------------------------- ? ?Cardiac Enzymes ?No results for input(s): CKMB, TROPONINI, MYOGLOBIN in the last 168 hours. ? ?Invalid input(s): CK ?

## 2021-06-12 ENCOUNTER — Inpatient Hospital Stay (HOSPITAL_COMMUNITY): Payer: Self-pay

## 2021-06-12 DIAGNOSIS — K6389 Other specified diseases of intestine: Secondary | ICD-10-CM

## 2021-06-12 LAB — COMPREHENSIVE METABOLIC PANEL
ALT: 63 U/L — ABNORMAL HIGH (ref 0–44)
AST: 54 U/L — ABNORMAL HIGH (ref 15–41)
Albumin: 3.8 g/dL (ref 3.5–5.0)
Alkaline Phosphatase: 56 U/L (ref 38–126)
Anion gap: 6 (ref 5–15)
BUN: 18 mg/dL (ref 6–20)
CO2: 25 mmol/L (ref 22–32)
Calcium: 7.9 mg/dL — ABNORMAL LOW (ref 8.9–10.3)
Chloride: 106 mmol/L (ref 98–111)
Creatinine, Ser: 1.09 mg/dL (ref 0.61–1.24)
GFR, Estimated: >60 mL/min (ref >60)
Glucose, Bld: 102 mg/dL — ABNORMAL HIGH (ref 70–99)
Potassium: 3.7 mmol/L (ref 3.5–5.1)
Sodium: 137 mmol/L (ref 135–145)
Total Bilirubin: 0.6 mg/dL (ref 0.3–1.2)
Total Protein: 6.5 g/dL (ref 6.5–8.1)

## 2021-06-12 LAB — HEPARIN LEVEL (UNFRACTIONATED): Heparin Unfractionated: 0.27 IU/mL — ABNORMAL LOW (ref 0.30–0.70)

## 2021-06-12 LAB — CBC
HCT: 41.6 % (ref 39.0–52.0)
Hemoglobin: 13.6 g/dL (ref 13.0–17.0)
MCH: 28.3 pg (ref 26.0–34.0)
MCHC: 32.7 g/dL (ref 30.0–36.0)
MCV: 86.7 fL (ref 80.0–100.0)
Platelets: 145 10*3/uL — ABNORMAL LOW (ref 150–400)
RBC: 4.8 MIL/uL (ref 4.22–5.81)
RDW: 13.4 % (ref 11.5–15.5)
WBC: 9.4 10*3/uL (ref 4.0–10.5)
nRBC: 0 % (ref 0.0–0.2)

## 2021-06-12 LAB — GLUCOSE, CAPILLARY
Glucose-Capillary: 102 mg/dL — ABNORMAL HIGH (ref 70–99)
Glucose-Capillary: 130 mg/dL — ABNORMAL HIGH (ref 70–99)

## 2021-06-12 LAB — PROTIME-INR
INR: 2.5 — ABNORMAL HIGH (ref 0.8–1.2)
Prothrombin Time: 27.2 seconds — ABNORMAL HIGH (ref 11.4–15.2)

## 2021-06-12 LAB — HIV ANTIBODY (ROUTINE TESTING W REFLEX): HIV Screen 4th Generation wRfx: NONREACTIVE

## 2021-06-12 MED ORDER — ALPRAZOLAM 1 MG PO TABS
1.0000 mg | ORAL_TABLET | Freq: Every day | ORAL | Status: DC
  Administered 2021-06-12 – 2021-06-13 (×2): 1 mg via ORAL
  Filled 2021-06-12 (×2): qty 1

## 2021-06-12 NOTE — Progress Notes (Signed)
ANTICOAGULATION CONSULT NOTE -  ? ?Pharmacy Consult for Heparin ?Indication: bridge for heart valve, pt on Warfarin PTA ? ?No Known Allergies ? ?Patient Measurements: ?Height: 5' 8"  (172.7 cm) ?Weight: 105 kg (231 lb 7.7 oz) ?IBW/kg (Calculated) : 68.4 ?HEPARIN DW (KG): 91.4 ? ?Vital Signs: ?Temp: 98.4 ?F (36.9 ?C) (03/26 8657) ?Temp Source: Oral (03/26 8469) ?BP: 104/67 (03/26 0603) ?Pulse Rate: 52 (03/26 0603) ? ?Labs: ?Recent Labs  ?  06/11/21 ?0455 06/11/21 ?0459 06/11/21 ?1907 06/12/21 ?0432  ?HGB 16.3  --   --  13.6  ?HCT 48.3  --   --  41.6  ?PLT 186  --   --  145*  ?LABPROT  --  25.0* 29.8* 27.2*  ?INR  --  2.3* 2.8* 2.5*  ?HEPARINUNFRC  --   --  0.13* 0.27*  ?CREATININE 1.20  --   --  1.09  ? ? ?Estimated Creatinine Clearance: 102.6 mL/min (by C-G formula based on SCr of 1.09 mg/dL). ? ?Assessment: ?40 y.o. male who presents with a 1 day history of bilateral lower abdominal pain.  The pain started yesterday, and was associated with nausea, vomiting, and diarrhea. Pt on Warfarin PTA w/ therapeutic INR on low end of range.  Asked to initiate Heparin as bridge as pt has h/o artificial heart valve. ? ?INR 2.8 this evening, Warfarin on hold and INR will slow trend down ?Heparin level 0.13>0.27 ? ?Goal of Therapy:  ?Heparin level 0.3-0.7 units/ml ?Monitor platelets by anticoagulation protocol: Yes ?  ?Plan:  ?Will only increase heparin slightly to 1400 units / hr and recheck HL in 6 hrs ?Check HL and INR daily ?Monitor CBC, s/sx bleeding complications ? ?Hart Robinsons A ?06/12/2021,9:10 AM ? ? ?

## 2021-06-12 NOTE — Hospital Course (Signed)
Per HPI: ?Chase Lee  is a 41 y.o. male reformed smoker with history of opiate dependence currently on Suboxone maintenance therapy, history of aortic valve replacement with mechanical valve at age 38 on chronic Coumadin therapy, HTN and anxiety disorder as well as GERD who presents to the ED with concerns about persistent abdominal pain since about 2 PM on 06/10/2021 associated with nausea and vomiting, emesis was at times bilious but no blood ?-Patient also had diarrhea ?No fever  Or chills  ?-In ED CT suggest colitis with possible ascending colon mass and Small amount of portal venous gas in the left lobe of the liver---??  Significance of this finding ?-General surgery and GI consult requested, radiology reread patient's films ? ?3/26: Patient has been admitted with acute colitis as well as possible mass.  He remains on Rocephin and Flagyl with improvement in pain noted.  Slow dietary advancement per general surgery.  GI evaluating transaminitis with abdominal ultrasound and acute hepatitis panel.  Repeat a.m. labs. ?

## 2021-06-12 NOTE — Progress Notes (Signed)
[facility] Progress Note ? ?   ?Subjective: ?Patient seen and examined.  He is resting comfortably in bed.  He tolerated his clear liquid diet without nausea and vomiting.  He is passing flatus and has had 2 loose bowel movements.  His abdominal pain is significantly improved today, and he feels much better.  Denies fevers and chills. ? ?Objective: ?Vital signs in last 24 hours: ?Temp:  [98 ?F (36.7 ?C)-98.4 ?F (36.9 ?C)] 98.4 ?F (36.9 ?C) (03/26 8338) ?Pulse Rate:  [52-60] 58 (03/26 1226) ?Resp:  [15-20] 15 (03/26 1226) ?BP: (104-132)/(67-95) 128/83 (03/26 1226) ?SpO2:  [96 %-100 %] 100 % (03/26 1226) ?Last BM Date : 06/12/21 ? ?Intake/Output from previous day: ?03/25 0701 - 03/26 0700 ?In: 2472.8 [I.V.:1181.2; IV Piggyback:1291.6] ?Out: -  ?Intake/Output this shift: ?No intake/output data recorded. ? ?General appearance: alert, cooperative, and no distress ?GI: Abdomen soft, nondistended, no percussion tenderness, nontender to palpation; no rigidity, guarding, rebound tenderness ? ?Lab Results:  ?Recent Labs  ?  06/11/21 ?2505 06/12/21 ?3976  ?WBC 7.8 9.4  ?HGB 16.3 13.6  ?HCT 48.3 41.6  ?PLT 186 145*  ? ?BMET ?Recent Labs  ?  06/11/21 ?0455 06/12/21 ?0432  ?NA 133* 137  ?K 3.8 3.7  ?CL 99 106  ?CO2 25 25  ?GLUCOSE 155* 102*  ?BUN 19 18  ?CREATININE 1.20 1.09  ?CALCIUM 8.8* 7.9*  ? ?PT/INR ?Recent Labs  ?  06/11/21 ?1907 06/12/21 ?0432  ?LABPROT 29.8* 27.2*  ?INR 2.8* 2.5*  ? ? ?Studies/Results: ?CT Abdomen Pelvis W Contrast ? ?Result Date: 06/11/2021 ?CLINICAL DATA:  41 year old male with history of right lower quadrant abdominal pain and sharp epigastric pain. EXAM: CT ABDOMEN AND PELVIS WITH CONTRAST TECHNIQUE: Multidetector CT imaging of the abdomen and pelvis was performed using the standard protocol following bolus administration of intravenous contrast. RADIATION DOSE REDUCTION: This exam was performed according to the departmental dose-optimization program which includes automated  exposure control, adjustment of the mA and/or kV according to patient size and/or use of iterative reconstruction technique. CONTRAST:  68m OMNIPAQUE IOHEXOL 300 MG/ML  SOLN COMPARISON:  No priors. 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 Hepatobiliary: There is a small amount of intrahepatic gas which is evident in the superior aspect of the left lobe of the liver, particularly notable adjacent to the left hepatic vein. On coronal views this appears to be rather peripheral in the liver, favored to be portal venous gas. Mild diffuse low attenuation throughout the hepatic parenchyma, indicative of a background of hepatic steatosis. No focal cystic or solid hepatic lesions. No intra or extrahepatic biliary ductal dilatation. Gallbladder is normal in appearance. Pancreas: No pancreatic mass. No pancreatic ductal dilatation. No pancreatic or peripancreatic fluid collections or inflammatory changes. Spleen: Unremarkable. Adrenals/Urinary Tract: Subcentimeter low-attenuation lesion in the right kidney, too small to characterize, but statistically likely to represent a tiny cyst. Left kidney and bilateral adrenal glands are normal in appearance. No hydroureteronephrosis. Urinary bladder is normal in appearance. Stomach/Bowel: Normal appearance of the stomach. No pathologic dilatation of small bowel or colon. There are several areas of mural thickening and mucosal hyperenhancement in the colon, including the sigmoid colon and ascending colon. In the ascending colon the appearance is somewhat mass-like in some regions (reference axial image 51 of series 2). There is also slight haziness  in the mesocolic fat adjacent to the ascending colon. Normal appendix. Vascular/Lymphatic: Atherosclerotic calcifications in the pelvic  vasculature. No aneurysm or dissection noted in the abdominal or pelvic vasculature. No lymphadenopathy noted in the abdomen or pelvis. Surgical clips are noted in the right inguinal region. Reproductive: Prostate gland and seminal vesicles are unremarkable in appearance. Other: Trace volume of ascites in the low anatomic pelvis. No pneumoperitoneum. Musculoskeletal: There are no aggressive appearing lytic or blastic lesions noted in the visualized portions of the skeleton. IMPRESSION: 1. Small amount of portal venous gas in the left lobe of the liver. This is of uncertain etiology. However, there are several areas of mural thickening and mucosal hyperenhancement in the colon, most evident in the sigmoid colon and ascending colon. In addition, there are inflammatory changes surrounding the ascending colon. Clinical correlation for signs and symptoms of colitis is recommended. Given the presence of portal venous gas, correlation with lactate levels is recommended. 2. In the ascending colon (axial image 51 of series 2) there is a mass-like area. This may simply represent an area of more severe inflammation, however, the possibility of underlying neoplasm is not excluded. Further evaluation with nonemergent colonoscopy is suggested in the near future to better evaluate this finding. 3. Status post aortic valve replacement. 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 These results were called by telephone at the time of interpretation on 06/11/2021 at 7:24 am to provider [facility], who verbally acknowledged these results. Electronically Signed   By: Vinnie Langton M.D.   On: 06/11/2021 07:24   ? ?Anti-infectives: ?Anti-infectives (From admission, onward)  ? ? Start     Dose/Rate Route Frequency Ordered Stop  ? 06/11/21 2000  cefTRIAXone (ROCEPHIN) 2 g in sodium chloride 0.9 % 100 mL IVPB       ? 2 g ?200 mL/hr over 30 Minutes Intravenous Every 24 hours  06/11/21 0822    ? 06/11/21 1800  metroNIDAZOLE (FLAGYL) IVPB 500 mg       ? 500 mg ?100 mL/hr over 60 Minutes Intravenous Every 12 hours 06/11/21 0822    ? 06/11/21 0745  ceFEPIme (MAXIPIME) 2 g in sodium chloride 0.9 % 100 mL IVPB       ?See Hyperspace for full Linked Orders Report.  ? 2 g ?200 mL/hr over 30 Minutes Intravenous  Once 06/11/21 0731 06/11/21 0807  ? 06/11/21 0745  metroNIDAZOLE (FLAGYL) IVPB 500 mg       ?See Hyperspace for full Linked Orders Report.  ? 500 mg ?100 mL/hr over 60 Minutes Intravenous  Once 06/11/21 0731 06/11/21 0919  ? ?  ? ? ?Assessment/Plan: ? ?Patient is a 42 year old male who presents with a 1 day history of bilateral lower abdominal pain.  CT abdomen pelvis concerning for a sending colitis with a possible mass, sigmoid colitis, and small amount of portal venous gas without any other ischemic changes.  WBC 7.8, LA 1.4 ? ?-Continues to not have a leukocytosis, WBC 9.4 today ?-Tolerated clear liquid diet without nausea and vomiting ?-Will advance to full liquid diet today, and plan to advance to GI soft diet tomorrow morning if tolerating diet without nausea, vomiting, or increased abdominal pain ?-I explained that I am being very conservative with his diet, as we still do not know why he had portal venous gas on his CT scan, and I want to make sure he is adequately tolerating the current level of diet before I advance it  too quickly.  I discussed this diet plan with the patient and his wife, who were agreeable. ?-Continue Rocephin and Flagyl ?-IV fluids per primary team ?-Would continue heparin drip in the event patient's clinical status worsens and he needs surgery ?-No plans for surgery at this time ?-Appreciate hospitalist and GI recommendations ? ? LOS: 1 day  ? ? ?Wai Litt A Vanissa Strength ?06/12/2021 ? ?

## 2021-06-12 NOTE — Progress Notes (Signed)
?PROGRESS NOTE ? ? ? ?Chase Lee  NUU:[id number] DOB: 13-Sep-1980 DOA: 06/11/2021 ?PCP: Leonie Douglas, MD ? ? ?Brief Narrative:  ?Per HPI: ?Chase Lee  is a 41 y.o. male reformed smoker with history of opiate dependence currently on Suboxone maintenance therapy, history of aortic valve replacement with mechanical valve at age 53 on chronic Coumadin therapy, HTN and anxiety disorder as well as GERD who presents to the ED with concerns about persistent abdominal pain since about 2 PM on 06/10/2021 associated with nausea and vomiting, emesis was at times bilious but no blood ?-Patient also had diarrhea ?No fever  Or chills  ?-In ED CT suggest colitis with possible ascending colon mass and Small amount of portal venous gas in the left lobe of the liver---??  Significance of this finding ?-General surgery and GI consult requested, radiology reread patient's films ? ?3/26: Patient has been admitted with acute colitis as well as possible mass.  He remains on Rocephin and Flagyl with improvement in pain noted.  Slow dietary advancement per general surgery.  GI evaluating transaminitis with abdominal ultrasound and acute hepatitis panel.  Repeat a.m. labs.  ? ? ?Assessment & Plan: ?  ?Principal Problem: ?  Colitis ?Active Problems: ?  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 ?  Disorder of hepatic portal vein/Small amount of portal venous gas in the left lobe of the liver ?  H/o Opiate Dependence/ On Suboxone maintenance therapy ?  Status post mechanical aortic valve replacement at Age 37 ? ?Assessment and Plan: ? ? ? 1)Acute colitis--CT abdomen findings appreciated ?-Lactic acidosis not elevated ?-General surgery and GI consults appreciated ?-General surgeon recommends clear liquid diet with advancement to full liquid and possible soft by a.m. ?-Patient started on IV Rocephin and Flagyl ?-Stool for C. difficile negative and GI pathogen/culture pending ?-IV Toradol and Dilaudid judiciously ?-IV Zofran as needed ?  ?2)Possible mass  of the ascending colon--- defer timing of colonoscopy to rule out malignancy to GI team ?  ?3) status post prior mechanical valve placement at age 13-- ?-PTA patient was on Coumadin INR was 2.3 ?-Placed on IV heparin drip/bridge for now just in case patient needs surgical/GI interventions or procedures ?  ?4)Small amount of portal venous gas in the left lobe of the liver----??? Significance ?-On imaging studies mentioned mesenteric arteries appear widely patent ?-VTE less likely given therapeutic INR ?-GI and general surgery consult appreciated ?  ?5) history of opiate dependence currently on Suboxone maintenance therapy----prior to admission patient was trying to wean himself off the Suboxone ? ?6) transaminitis ?-Continue to follow labs with abdominal ultrasound and hepatitis panel pending ? ?7) history of anxiety ?-Resume home Xanax scheduled ? ? ?DVT prophylaxis: Heparin drip ?Code Status: Full ?Family Communication: None at bedside ?Disposition Plan:  ?Status is: Inpatient ?Remains inpatient appropriate because: Continue IV medications. ? ?Consultants:  ?GI ?General surgery ? ?Procedures:  ?See below ? ?Antimicrobials:  ?Anti-infectives (From admission, onward)  ? ? Start     Dose/Rate Route Frequency Ordered Stop  ? 06/11/21 2000  cefTRIAXone (ROCEPHIN) 2 g in sodium chloride 0.9 % 100 mL IVPB       ? 2 g ?200 mL/hr over 30 Minutes Intravenous Every 24 hours 06/11/21 0822    ? 06/11/21 1800  metroNIDAZOLE (FLAGYL) IVPB 500 mg       ? 500 mg ?100 mL/hr over 60 Minutes Intravenous Every 12 hours 06/11/21 0822    ? 06/11/21 0745  ceFEPIme (MAXIPIME) 2 g in sodium chloride 0.9 %  100 mL IVPB       ?See Hyperspace for full Linked Orders Report.  ? 2 g ?200 mL/hr over 30 Minutes Intravenous  Once 06/11/21 0731 06/11/21 0807  ? 06/11/21 0745  metroNIDAZOLE (FLAGYL) IVPB 500 mg       ?See Hyperspace for full Linked Orders Report.  ? 500 mg ?100 mL/hr over 60 Minutes Intravenous  Once 06/11/21 0731 06/11/21 0919  ? ?   ? ? ?Subjective: ?Patient seen and evaluated today with decrease in abdominal pain.  He was quite hungry this a.m and denies any nausea or vomiting. No acute concerns or events noted overnight. ? ?Objective: ?Vitals:  ? 06/11/21 2218 06/12/21 0200 06/12/21 0603 06/12/21 1226  ?BP: 118/72 107/68 104/67 128/83  ?Pulse: (!) 55 (!) 55 (!) 52 (!) 58  ?Resp:  18 18 15   ?Temp: 98.3 ?F (36.8 ?C) 98 ?F (36.7 ?C) 98.4 ?F (36.9 ?C)   ?TempSrc: Oral Oral Oral   ?SpO2: 100% 96% 96% 100%  ?Weight:      ?Height:      ? ? ?Intake/Output Summary (Last 24 hours) at 06/12/2021 1245 ?Last data filed at 06/12/2021 0900 ?Gross per 24 hour  ?Intake 1279.07 ml  ?Output --  ?Net 1279.07 ml  ? ?Filed Weights  ? 06/11/21 0444  ?Weight: 105 kg  ? ? ?Examination: ? ?General exam: Appears calm and comfortable  ?Respiratory system: Clear to auscultation. Respiratory effort normal. ?Cardiovascular system: S1 & S2 heard, RRR.  ?Gastrointestinal system: Abdomen is soft, less tender to palpation over right lower quadrant. ?Central nervous system: Alert and awake ?Extremities: No edema ?Skin: No significant lesions noted ?Psychiatry: Flat affect. ? ? ? ?Data Reviewed: I have personally reviewed following labs and imaging studies ? ?CBC: ?Recent Labs  ?Lab 06/11/21 ?1638 06/12/21 ?0432  ?WBC 7.8 9.4  ?HGB 16.3 13.6  ?HCT 48.3 41.6  ?MCV 87.2 86.7  ?PLT 186 145*  ? ?Basic Metabolic Panel: ?Recent Labs  ?Lab 06/11/21 ?4665 06/12/21 ?0432  ?NA 133* 137  ?K 3.8 3.7  ?CL 99 106  ?CO2 25 25  ?GLUCOSE 155* 102*  ?BUN 19 18  ?CREATININE 1.20 1.09  ?CALCIUM 8.8* 7.9*  ? ?GFR: ?Estimated Creatinine Clearance: 102.6 mL/min (by C-G formula based on SCr of 1.09 mg/dL). ?Liver Function Tests: ?Recent Labs  ?Lab 06/11/21 ?9935 06/12/21 ?0432  ?AST 48* 54*  ?ALT 48* 63*  ?ALKPHOS 74 56  ?BILITOT 1.5* 0.6  ?PROT 8.5* 6.5  ?ALBUMIN 5.1* 3.8  ? ?Recent Labs  ?Lab 06/11/21 ?7017  ?LIPASE 25  ? ?No results for input(s): AMMONIA in the last 168 hours. ?Coagulation  Profile: ?Recent Labs  ?Lab 06/11/21 ?0459 06/11/21 ?1907 06/12/21 ?0432  ?INR 2.3* 2.8* 2.5*  ? ?Cardiac Enzymes: ?No results for input(s): CKTOTAL, CKMB, CKMBINDEX, TROPONINI in the last 168 hours. ?BNP (last 3 results) ?No results for input(s): PROBNP in the last 8760 hours. ?HbA1C: ?No results for input(s): HGBA1C in the last 72 hours. ?CBG: ?Recent Labs  ?Lab 06/11/21 ?1757 06/11/21 ?2356 06/12/21 ?0606 06/12/21 ?1110  ?GLUCAP 130* 96 102* 130*  ? ?Lipid Profile: ?No results for input(s): CHOL, HDL, LDLCALC, TRIG, CHOLHDL, LDLDIRECT in the last 72 hours. ?Thyroid Function Tests: ?No results for input(s): TSH, T4TOTAL, FREET4, T3FREE, THYROIDAB in the last 72 hours. ?Anemia Panel: ?No results for input(s): VITAMINB12, FOLATE, FERRITIN, TIBC, IRON, RETICCTPCT in the last 72 hours. ?Sepsis Labs: ?Recent Labs  ?Lab 06/11/21 ?7939  ?LATICACIDVEN 1.4  ? ? ?Recent Results (from the past  240 hour(s))  ?C Difficile Quick Screen w PCR reflex     Status: None  ? Collection Time: 06/11/21 10:34 AM  ? Specimen: STOOL  ?Result Value Ref Range Status  ? C Diff antigen NEGATIVE NEGATIVE Final  ? C Diff toxin NEGATIVE NEGATIVE Final  ? C Diff interpretation No C. difficile detected.  Final  ?  Comment: Performed at [facility], [address], [place], [place] [address]  ?  ? ? ? ? ? ?Radiology Studies: ?CT Abdomen Pelvis W Contrast ? ?Result Date: 06/11/2021 ?CLINICAL DATA:  41 year old male with history of right lower quadrant abdominal pain and sharp epigastric pain. EXAM: CT ABDOMEN AND PELVIS WITH CONTRAST TECHNIQUE: Multidetector CT imaging of the abdomen and pelvis was performed using the standard protocol following bolus administration of intravenous contrast. RADIATION DOSE REDUCTION: This exam was performed according to the departmental dose-optimization program which includes automated exposure control, adjustment of the mA and/or kV according to patient size and/or use of iterative reconstruction technique. CONTRAST:   70m OMNIPAQUE IOHEXOL 300 MG/ML  SOLN COMPARISON:  No priors. 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

## 2021-06-12 NOTE — Progress Notes (Signed)
Chase Lee, M.D. ?Gastroenterology & Hepatology ? ? ?Interval History:  ?No acute events overnight. ?Patient reported he feels better and his abdominal pain has decreased.  Had a soft bowel movement today without blood or melena.  No nausea, vomiting, abdominal distention, fever or chills. ?CBC showed mild thrombocytopenia 145 with rest within normal limits, CMP showed mild elevation of AST of 54 and ALT of 63, rest of liver function test and renal function within normal limits. ?C. difficile testing was negative. ?Patient tolerated liquid diet today. ? ?Inpatient Medications: ? ?Current Facility-Administered Medications:  ?  0.9 %  sodium chloride infusion, 250 mL, Intravenous, PRN, Denton Brick, Courage, MD ?  acetaminophen (TYLENOL) tablet 650 mg, 650 mg, Oral, Q6H PRN **OR** acetaminophen (TYLENOL) suppository 650 mg, 650 mg, Rectal, Q6H PRN, Emokpae, Courage, MD ?  ALPRAZolam Duanne Moron) tablet 0.5 mg, 0.5 mg, Oral, BID PRN, Denton Brick, Courage, MD ?  ALPRAZolam Duanne Moron) tablet 1 mg, 1 mg, Oral, Q breakfast, Manuella Ghazi, Pratik D, DO, 1 mg at 06/12/21 0940 ?  bisacodyl (DULCOLAX) suppository 10 mg, 10 mg, Rectal, Daily PRN, Emokpae, Courage, MD ?  cefTRIAXone (ROCEPHIN) 2 g in sodium chloride 0.9 % 100 mL IVPB, 2 g, Intravenous, Q24H, Emokpae, Courage, MD, Last Rate: 200 mL/hr at 06/11/21 2100, 2 g at 06/11/21 2100 ?  dextrose 5 %-0.9 % sodium chloride infusion, , Intravenous, Continuous, Emokpae, Courage, MD, Last Rate: 125 mL/hr at 06/12/21 0508, New Bag at 06/12/21 0508 ?  escitalopram (LEXAPRO) tablet 10 mg, 10 mg, Oral, Daily, Emokpae, Courage, MD ?  heparin ADULT infusion 100 units/mL (25000 units/276m), 1,400 Units/hr, Intravenous, Continuous, Hall, Scott A, RPH, Last Rate: 14 mL/hr at 06/12/21 1022, 1,400 Units/hr at 06/12/21 1022 ?  HYDROmorphone (DILAUDID) injection 1 mg, 1 mg, Intravenous, Q3H PRN, EDenton Brick Courage, MD, 1 mg at 06/12/21 0839 ?  metroNIDAZOLE (FLAGYL) IVPB 500 mg, 500 mg, Intravenous, Q12H,  Emokpae, Courage, MD, Last Rate: 100 mL/hr at 06/12/21 0510, 500 mg at 06/12/21 0510 ?  ondansetron (ZOFRAN) injection 4 mg, 4 mg, Intravenous, Q6H PRN, EDenton Brick Courage, MD, 4 mg at 06/11/21 2142 ?  polyethylene glycol (MIRALAX / GLYCOLAX) packet 17 g, 17 g, Oral, Daily PRN, Emokpae, Courage, MD ?  sodium chloride flush (NS) 0.9 % injection 3 mL, 3 mL, Intravenous, Q12H, Emokpae, Courage, MD, 3 mL at 06/12/21 0846 ?  sodium chloride flush (NS) 0.9 % injection 3 mL, 3 mL, Intravenous, Q12H, Emokpae, Courage, MD, 3 mL at 06/12/21 0846 ?  sodium chloride flush (NS) 0.9 % injection 3 mL, 3 mL, Intravenous, PRN, EDenton Brick Courage, MD, 3 mL at 06/11/21 1243  ? ?I/O   ? ?Intake/Output Summary (Last 24 hours) at 06/12/2021 1048 ?Last data filed at 06/12/2021 0900 ?Gross per 24 hour  ?Intake 1279.07 ml  ?Output --  ?Net 1279.07 ml  ?  ? ?Physical Exam: ?Temp:  [98 ?F (36.7 ?C)-98.4 ?F (36.9 ?C)] 98.4 ?F (36.9 ?C) (03/26 02440 ?Pulse Rate:  [52-60] 52 (03/26 0603) ?Resp:  [18-20] 18 (03/26 0603) ?BP: (104-150)/(67-95) 104/67 (03/26 0603) ?SpO2:  [96 %-100 %] 96 % (03/26 0603)  ?Temp (24hrs), Avg:98.1 ?F (36.7 ?C), Min:98 ?F (36.7 ?C), Max:98.4 ?F (36.9 ?C) ?GENERAL: The patient is AO x3, in no acute distress. Obese. ?HEENT: Head is normocephalic and atraumatic. EOMI are intact. Mouth is well hydrated and without lesions. ?NECK: Supple. No masses ?LUNGS: Clear to auscultation. No presence of rhonchi/wheezing/rales. Adequate chest expansion ?HEART: RRR, normal s1 and s2. ?ABDOMEN: Soft, nontender, no guarding, no peritoneal signs,  and nondistended. BS +. No masses. ?EXTREMITIES: Without any cyanosis, clubbing, rash, lesions or edema. ?NEUROLOGIC: AOx3, no focal motor deficit. ?SKIN: no jaundice, no rashes ? ?Laboratory Data: ?CBC:  ?   ?Component Value Date/Time  ? WBC 9.4 06/12/2021 0432  ? RBC 4.80 06/12/2021 0432  ? HGB 13.6 06/12/2021 0432  ? HCT 41.6 06/12/2021 0432  ? PLT 145 (L) 06/12/2021 0432  ? MCV 86.7 06/12/2021 0432   ? MCH 28.3 06/12/2021 0432  ? MCHC 32.7 06/12/2021 0432  ? RDW 13.4 06/12/2021 0432  ? ?COAG:  ?Lab Results  ?Component Value Date  ? INR 2.5 (H) 06/12/2021  ? INR 2.8 (H) 06/11/2021  ? INR 2.3 (H) 06/11/2021  ?  ?BMP:  ? ?  Latest Ref Rng & Units 06/12/2021  ?  4:32 AM 06/11/2021  ?  4:55 AM 12/27/2019  ? 11:12 AM  ?BMP  ?Glucose 70 - 99 mg/dL 102   155   129    ?BUN 6 - 20 mg/dL 18   19   13     ?Creatinine 0.61 - 1.24 mg/dL 1.09   1.20   1.00    ?Sodium 135 - 145 mmol/L 137   133   135    ?Potassium 3.5 - 5.1 mmol/L 3.7   3.8   3.5    ?Chloride 98 - 111 mmol/L 106   99   101    ?CO2 22 - 32 mmol/L 25   25   25     ?Calcium 8.9 - 10.3 mg/dL 7.9   8.8   8.5    ?  ?HEPATIC:  ? ?  Latest Ref Rng & Units 06/12/2021  ?  4:32 AM 06/11/2021  ?  4:55 AM  ?Hepatic Function  ?Total Protein 6.5 - 8.1 g/dL 6.5   8.5    ?Albumin 3.5 - 5.0 g/dL 3.8   5.1    ?AST 15 - 41 U/L 54   48    ?ALT 0 - 44 U/L 63   48    ?Alk Phosphatase 38 - 126 U/L 56   74    ?Total Bilirubin 0.3 - 1.2 mg/dL 0.6   1.5    ?  ?CARDIAC: No results found for: CKTOTAL, CKMB, CKMBINDEX, TROPONINI  ? ? ?Imaging: I personally reviewed and interpreted the available labs, imaging and endoscopic files. ?  ?Assessment/Plan: ?Chase Lee is a 41 y.o. male with history of anxiety and aortic valve replacement on Coumadin, who came to the hospital for evaluation of acute onset of abdominal pain, diarrhea, nausea, vomiting.  Gastroenterology was consulted as patient also was found to have free portal air on CT scan. ?  ?The patient had acute onset of symptoms since yesterday which given the timeline of his presentation were concerning for an acute gastroenteritis, viral versus bacterial in etiology.  I agree with the assessment of general surgery as clinically his abdomen is very benign and is not concerning at the moment for a ruptured viscus.  I also discussed the case with the on-call radiologist Dr. Weber Cooks, who agreed that given the there is presence of some  unexplained portal gas in proximity to the liver, there is no evidence of pneumatosis on imaging which is reassuring.  It is possible that his current presentation is related to an acute gastroenteritis which will need to be further evaluated with GI pathogen panel, C. difficile testing was negative.  I agree with continuing his antibiotic coverage with ceftriaxone and Flagyl for now.  Tolerated clear liquid diet today without any problem, can consider advancing his diet to GI soft tomorrow. ?  ?I explained to the patient that ultimately he will need a colonoscopy once he recovers from his acute presentation as he had presence of a possible mass in the right side of his colon.  Ideally, we should wait at least 6 to 8 weeks to perform this investigation.  We will need to work closely with his cardiologist as he is on Coumadin for mechanic aortic valve -he may need to be admitted to undergo heparin drip bridging. ? ?Finally, he presented some mild elevation of his LFTs, we will check an acute hepatitis panel and a liver ultrasound. ?  ?-Continue liquid diet today can advance to GI soft tomorrow ?-Continue with ceftriaxone and metronidazole IV ?-Appreciate general surgery recommendations ?-Follow-up GI pathogen and stool ?-Patient will require an outpatient colonoscopy in 6 to 8 weeks, possibly will need to be admitted to undergo heparin drip bridging. ?-Check acute hepatitis panel ?-Check liver ultrasound. ? ?Chase Peppers, MD ?Gastroenterology and Hepatology ?[facility] ? ?

## 2021-06-12 NOTE — Progress Notes (Signed)
Lab states they have had two people try and draw blood and are not able to get any. MD Manuella Ghazi made aware.  ?

## 2021-06-13 ENCOUNTER — Telehealth: Payer: Self-pay | Admitting: Gastroenterology

## 2021-06-13 DIAGNOSIS — R109 Unspecified abdominal pain: Secondary | ICD-10-CM

## 2021-06-13 LAB — CBC
HCT: 39.3 % (ref 39.0–52.0)
Hemoglobin: 12.7 g/dL — ABNORMAL LOW (ref 13.0–17.0)
MCH: 28.3 pg (ref 26.0–34.0)
MCHC: 32.3 g/dL (ref 30.0–36.0)
MCV: 87.7 fL (ref 80.0–100.0)
Platelets: 147 10*3/uL — ABNORMAL LOW (ref 150–400)
RBC: 4.48 MIL/uL (ref 4.22–5.81)
RDW: 13.6 % (ref 11.5–15.5)
WBC: 8.9 10*3/uL (ref 4.0–10.5)
nRBC: 0 % (ref 0.0–0.2)

## 2021-06-13 LAB — GASTROINTESTINAL PANEL BY PCR, STOOL (REPLACES STOOL CULTURE)

## 2021-06-13 LAB — COMPREHENSIVE METABOLIC PANEL
ALT: 44 U/L (ref 0–44)
AST: 30 U/L (ref 15–41)
Albumin: 3.7 g/dL (ref 3.5–5.0)
Alkaline Phosphatase: 60 U/L (ref 38–126)
Anion gap: 5 (ref 5–15)
BUN: 10 mg/dL (ref 6–20)
CO2: 24 mmol/L (ref 22–32)
Calcium: 7.6 mg/dL — ABNORMAL LOW (ref 8.9–10.3)
Chloride: 109 mmol/L (ref 98–111)
Creatinine, Ser: 0.86 mg/dL (ref 0.61–1.24)
GFR, Estimated: >60 mL/min (ref >60)
Glucose, Bld: 112 mg/dL — ABNORMAL HIGH (ref 70–99)
Potassium: 3.5 mmol/L (ref 3.5–5.1)
Sodium: 138 mmol/L (ref 135–145)
Total Bilirubin: 0.5 mg/dL (ref 0.3–1.2)
Total Protein: 6.2 g/dL — ABNORMAL LOW (ref 6.5–8.1)

## 2021-06-13 LAB — MAGNESIUM: Magnesium: 2.1 mg/dL (ref 1.7–2.4)

## 2021-06-13 LAB — HEPARIN LEVEL (UNFRACTIONATED): Heparin Unfractionated: 0.23 IU/mL — ABNORMAL LOW (ref 0.30–0.70)

## 2021-06-13 LAB — GLUCOSE, CAPILLARY
Glucose-Capillary: 101 mg/dL — ABNORMAL HIGH (ref 70–99)
Glucose-Capillary: 105 mg/dL — ABNORMAL HIGH (ref 70–99)
Glucose-Capillary: 136 mg/dL — ABNORMAL HIGH (ref 70–99)

## 2021-06-13 LAB — PROTIME-INR
INR: 2 — ABNORMAL HIGH (ref 0.8–1.2)
Prothrombin Time: 22.4 seconds — ABNORMAL HIGH (ref 11.4–15.2)

## 2021-06-13 LAB — HEPATITIS PANEL, ACUTE
HCV Ab: NONREACTIVE
Hep A IgM: NONREACTIVE
Hep B C IgM: NONREACTIVE
Hepatitis B Surface Ag: NONREACTIVE

## 2021-06-13 MED ORDER — LISINOPRIL 10 MG PO TABS
20.0000 mg | ORAL_TABLET | Freq: Every day | ORAL | Status: DC
  Administered 2021-06-13: 20 mg via ORAL
  Filled 2021-06-13: qty 2

## 2021-06-13 MED ORDER — IBUPROFEN 800 MG PO TABS: 800.0000 mg | ORAL_TABLET | Freq: Three times a day (TID) | ORAL | 0 refills | Status: DC | PRN

## 2021-06-13 MED ORDER — CIPROFLOXACIN HCL 500 MG PO TABS: 500.0000 mg | ORAL_TABLET | Freq: Two times a day (BID) | ORAL | 0 refills | Status: DC

## 2021-06-13 MED ORDER — LOPERAMIDE HCL 2 MG PO TABS: 2.0000 mg | ORAL_TABLET | Freq: Four times a day (QID) | ORAL | 0 refills | Status: DC | PRN

## 2021-06-13 MED ORDER — METRONIDAZOLE 500 MG PO TABS: 500.0000 mg | ORAL_TABLET | Freq: Three times a day (TID) | ORAL | 0 refills | Status: DC

## 2021-06-13 NOTE — Progress Notes (Signed)
Patient concerned about increase in his bp, states that he takes lisinopril 20 mg daily at home. MD Adefeso made aware, no new orders at this time. ?

## 2021-06-13 NOTE — Progress Notes (Signed)
Lab called GI panel , patient test positive for Norovirus, Dr, Manuella Ghazi is aware  ?

## 2021-06-13 NOTE — Telephone Encounter (Signed)
Next available appointment scheduled for 07/11/2021 at 11:15 am with Dr. Jenetta Downer. ?

## 2021-06-13 NOTE — Progress Notes (Signed)
[facility] Progress Note ? ?   ?Subjective: ?Patient seen and examined.  He is resting comfortably in bed.  He was able to tolerate his solid breakfast without nausea and vomiting.  He did not like the food that he was given for lunch.  He continues to move his bowels without issue.  His abdominal pain is mostly resolved. ? ?Objective: ?Vital signs in last 24 hours: ?Temp:  [98.1 ?F (36.7 ?C)-98.9 ?F (37.2 ?C)] 98.1 ?F (36.7 ?C) (03/27 1443) ?Pulse Rate:  [50-57] 50 (03/27 1443) ?Resp:  [20] 20 (03/27 1443) ?BP: (143-161)/(86-100) 161/100 (03/27 1443) ?SpO2:  [98 %-100 %] 98 % (03/27 1443) ?Last BM Date : 06/12/21 ? ?Intake/Output from previous day: ?03/26 0701 - 03/27 0700 ?In: 1737.8 [P.O.:600; I.V.:1137.8] ?Out: -  ?Intake/Output this shift: ?No intake/output data recorded. ? ?General appearance: alert, cooperative, and no distress ?GI: Abdomen soft, nondistended, no percussion tenderness, nontender to palpation, no rigidity, guarding, rebound tenderness ? ?Lab Results:  ?Recent Labs  ?  06/12/21 ?0432 06/13/21 ?2703  ?WBC 9.4 8.9  ?HGB 13.6 12.7*  ?HCT 41.6 39.3  ?PLT 145* 147*  ? ?BMET ?Recent Labs  ?  06/12/21 ?0432 06/13/21 ?5009  ?NA 137 138  ?K 3.7 3.5  ?CL 106 109  ?CO2 25 24  ?GLUCOSE 102* 112*  ?BUN 18 10  ?CREATININE 1.09 0.86  ?CALCIUM 7.9* 7.6*  ? ?PT/INR ?Recent Labs  ?  06/12/21 ?0432 06/13/21 ?3818  ?LABPROT 27.2* 22.4*  ?INR 2.5* 2.0*  ? ? ?Studies/Results: ?US Abdomen Limited RUQ (LIVER/GB) ? ?Result Date: 06/12/2021 ?CLINICAL DATA:  Elevated liver function tests EXAM: ULTRASOUND ABDOMEN LIMITED RIGHT UPPER QUADRANT COMPARISON:  06/11/2021 FINDINGS: Gallbladder: No gallstones or wall thickening visualized. No sonographic Murphy sign noted by sonographer. Common bile duct: Diameter: 2 mm Liver: Heterogeneous increased liver echotexture consistent with hepatic steatosis. No focal hepatic abnormality. No intrahepatic duct dilation. The portal venous gas visualized on CT yesterday  is not well visualized by ultrasound. Portal vein is patent on color Doppler imaging with normal direction of blood flow towards the liver. Other: None. IMPRESSION: 1. Hepatic steatosis. 2. Otherwise unremarkable right upper quadrant ultrasound. Electronically Signed   By: Randa Ngo M.D.   On: 06/12/2021 15:25   ? ?Anti-infectives: ?Anti-infectives (From admission, onward)  ? ? Start     Dose/Rate Route Frequency Ordered Stop  ? 06/13/21 0000  metroNIDAZOLE (FLAGYL) 500 MG tablet  Status:  Discontinued       ? 500 mg Oral 3 times daily 06/13/21 1409 06/13/21   ? 06/13/21 0000  ciprofloxacin (CIPRO) 500 MG tablet  Status:  Discontinued       ? 500 mg Oral 2 times daily 06/13/21 1409 06/13/21   ? 06/11/21 2000  cefTRIAXone (ROCEPHIN) 2 g in sodium chloride 0.9 % 100 mL IVPB       ? 2 g ?200 mL/hr over 30 Minutes Intravenous Every 24 hours 06/11/21 0822    ? 06/11/21 1800  metroNIDAZOLE (FLAGYL) IVPB 500 mg       ? 500 mg ?100 mL/hr over 60 Minutes Intravenous Every 12 hours 06/11/21 0822    ? 06/11/21 0745  ceFEPIme (MAXIPIME) 2 g in sodium chloride 0.9 % 100 mL IVPB       ?See Hyperspace for full Linked Orders Report.  ? 2 g ?200 mL/hr over 30 Minutes Intravenous  Once 06/11/21 0731 06/11/21 0807  ? 06/11/21 0745  metroNIDAZOLE (FLAGYL) IVPB 500 mg       ?  See Hyperspace for full Linked Orders Report.  ? 500 mg ?100 mL/hr over 60 Minutes Intravenous  Once 06/11/21 0731 06/11/21 0919  ? ?  ? ? ?Assessment/Plan: ? ?Patient is a 41 year old male who presented with a 1 day history of bilateral lower abdominal pain.  CT abdomen and pelvis concerning for ascending colitis with a possible mass, sigmoid colitis, small amount of portal venous gas without any other ischemic changes. ? ?-Regular diet ?-Patient still without leukocytosis ?-Antibiotics per primary team ?-May transition patient back to oral anticoagulation ?-As needed pain control and antiemetics ?-Patient will need follow-up with GI for colonoscopy in 6 to 8  weeks given concern for possible mass associated with ascending colitis ?-Patient stable for discharge from general surgery standpoint ?-Follow-up with me as needed ? ? LOS: 2 days  ? ? ?Karilynn Carranza A Krrish Freund ?06/13/2021 ? ?

## 2021-06-13 NOTE — Telephone Encounter (Signed)
Tried calling patient vm not set up. Per Dr. Jenetta Downer find out whom regulates coumadin and get patient an appointment here at the office in 3-4 weeks. Mitzie to let me know the next available appointment. ?

## 2021-06-13 NOTE — Telephone Encounter (Signed)
Hi! Needs hospital follow-up with Dr. Jenetta Downer. Recently inpatient. Will need to have colonoscopy arranged as outpatient.  ?

## 2021-06-13 NOTE — Progress Notes (Signed)
?  Transition of Care (TOC) Screening Note ? ? ?Patient Details  ?Name: Chase Lee ?Date of Birth: 1981/02/21 ? ? ?Transition of Care (TOC) CM/SW Contact:    ?Iona Beard, LCSWA ?Phone Number: ?06/13/2021, 11:07 AM ? ? ? ?Transition of Care Department [facility]) has reviewed patient and no TOC needs have been identified at this time. We will continue to monitor patient advancement through interdisciplinary progression rounds. If new patient transition needs arise, please place a TOC consult. ?  ?

## 2021-06-13 NOTE — Progress Notes (Signed)
? ?Gastroenterology Progress Note  ? ?Referring Provider: Dr. Denton Brick ?Primary Care Physician:  Leonie Douglas, MD ?Primary Gastroenterologist:  Dr. Jenetta Downer. Previously unassigned ? ?Patient ID: Chase Lee; [id number]; 10-Feb-1981  ? ? ?Subjective  ? ?Still loose stool but not explosive. Frequency of stool improved. Feels much better this morning after eating breakfast. Abdominal pain improved. States previously, he couldn't palpate his abdomen without discomfort. Now no discomfort. Ate an hour ago and doesn't feel the urgency that he needs to have a BM. About 6 stools in past 24 hours. Eager to go home.  ? ? ?Objective  ? ?Vital signs in last 24 hours ?Temp:  [98.8 ?F (37.1 ?C)-98.9 ?F (37.2 ?C)] 98.8 ?F (37.1 ?C) (03/27 0543) ?Pulse Rate:  [57-58] 57 (03/27 0543) ?Resp:  [15-20] 20 (03/27 0543) ?BP: (128-153)/(83-98) 153/98 (03/27 0543) ?SpO2:  [99 %-100 %] 99 % (03/27 0543) ?Last BM Date : 06/12/21 ? ?Physical Exam ?General:   Alert and oriented, pleasant ?Head:  Normocephalic and atraumatic. ?Eyes:  No icterus, sclera clear. Conjuctiva pink.  ?Abdomen:  Bowel sounds present, soft, non-tender, non-distended. No HSM or hernias noted. No rebound or guarding. No masses appreciated  ?Msk:  Symmetrical without gross deformities. Normal posture. ?Extremities:  Without edema. ?Neurologic:  Alert and  oriented x4 ?Psych:  Alert and cooperative. Normal mood and affect. ? ?Intake/Output from previous day: ?03/26 0701 - 03/27 0700 ?In: 1737.8 [P.O.:600; I.V.:1137.8] ?Out: -  ?Intake/Output this shift: ?No intake/output data recorded. ? ?Lab Results ? ?Recent Labs  ?  06/11/21 ?0455 06/12/21 ?0432 06/13/21 ?5573  ?WBC 7.8 9.4 8.9  ?HGB 16.3 13.6 12.7*  ?HCT 48.3 41.6 39.3  ?PLT 186 145* 147*  ? ?BMET ?Recent Labs  ?  06/11/21 ?0455 06/12/21 ?0432 06/13/21 ?2202  ?NA 133* 137 138  ?K 3.8 3.7 3.5  ?CL 99 106 109  ?CO2 25 25 24   ?GLUCOSE 155* 102* 112*  ?BUN 19 18 10   ?CREATININE 1.20 1.09 0.86  ?CALCIUM 8.8* 7.9* 7.6*   ? ?LFT ?Recent Labs  ?  06/11/21 ?0455 06/12/21 ?0432 06/13/21 ?5427  ?PROT 8.5* 6.5 6.2*  ?ALBUMIN 5.1* 3.8 3.7  ?AST 48* 54* 30  ?ALT 48* 63* 44  ?ALKPHOS 74 56 60  ?BILITOT 1.5* 0.6 0.5  ? ?PT/INR ?Recent Labs  ?  06/12/21 ?0432 06/13/21 ?0623  ?LABPROT 27.2* 22.4*  ?INR 2.5* 2.0*  ? ? ? ?Studies/Results ?CT Abdomen Pelvis W Contrast ? ?Result Date: 06/11/2021 ?CLINICAL DATA:  41 year old male with history of right lower quadrant abdominal pain and sharp epigastric pain. EXAM: CT ABDOMEN AND PELVIS WITH CONTRAST TECHNIQUE: Multidetector CT imaging of the abdomen and pelvis was performed using the standard protocol following bolus administration of intravenous contrast. RADIATION DOSE REDUCTION: This exam was performed according to the departmental dose-optimization program which includes automated exposure control, adjustment of the mA and/or kV according to patient size and/or use of iterative reconstruction technique. CONTRAST:  86m OMNIPAQUE IOHEXOL 300 MG/ML  SOLN COMPARISON:  No priors. 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 Hepatobiliary: There is a small amount of intrahepatic gas which is evident in the superior aspect of the left lobe of the liver, particularly notable adjacent to the left hepatic vein. On coronal views this appears to be rather peripheral in the liver, favored to be portal venous gas. Mild  diffuse low attenuation throughout the hepatic parenchyma, indicative of a background of hepatic steatosis. No focal cystic or solid hepatic lesions. No intra or extrahepatic biliary ductal dilatation. Gallbladder is normal in appearance. Pancreas: No pancreatic mass. No pancreatic ductal dilatation. No pancreatic or peripancreatic fluid collections or inflammatory changes. Spleen: Unremarkable.  Adrenals/Urinary Tract: Subcentimeter low-attenuation lesion in the right kidney, too small to characterize, but statistically likely to represent a tiny cyst. Left kidney and bilateral adrenal glands are normal in appearance. No hydroureteronephrosis. Urinary bladder is normal in appearance. Stomach/Bowel: Normal appearance of the stomach. No pathologic dilatation of small bowel or colon. There are several areas of mural thickening and mucosal hyperenhancement in the colon, including the sigmoid colon and ascending colon. In the ascending colon the appearance is somewhat mass-like in some regions (reference axial image 51 of series 2). There is also slight haziness in the mesocolic fat adjacent to the ascending colon. Normal appendix. Vascular/Lymphatic: Atherosclerotic calcifications in the pelvic vasculature. No aneurysm or dissection noted in the abdominal or pelvic vasculature. No lymphadenopathy noted in the abdomen or pelvis. Surgical clips are noted in the right inguinal region. Reproductive: Prostate gland and seminal vesicles are unremarkable in appearance. Other: Trace volume of ascites in the low anatomic pelvis. No pneumoperitoneum. Musculoskeletal: There are no aggressive appearing lytic or blastic lesions noted in the visualized portions of the skeleton. IMPRESSION: 1. Small amount of portal venous gas in the left lobe of the liver. This is of uncertain etiology. However, there are several areas of mural thickening and mucosal hyperenhancement in the colon, most evident in the sigmoid colon and ascending colon. In addition, there are inflammatory changes surrounding the ascending colon. Clinical correlation for signs and symptoms of colitis is recommended. Given the presence of portal venous gas, correlation with lactate levels is recommended. 2. In the ascending colon (axial image 51 of series 2) there is a mass-like area. This may simply represent an area of more severe inflammation, however, the  possibility of underlying neoplasm is not excluded. Further evaluation with nonemergent colonoscopy is suggested in the near future to better evaluate this finding. 3. Status post aortic valve replacement. 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 These results were called by telephone at the time of interpretation on 06/11/2021 at 7:24 am to provider [facility], who verbally acknowledged these results. Electronically Signed   By: Vinnie Langton M.D.   On: 06/11/2021 07:24  ? ?US Abdomen Limited RUQ (LIVER/GB) ? ?Result Date: 06/12/2021 ?CLINICAL DATA:  Elevated liver function tests EXAM: ULTRASOUND ABDOMEN LIMITED RIGHT UPPER QUADRANT COMPARISON:  06/11/2021 FINDINGS: Gallbladder: No gallstones or wall thickening visualized. No sonographic Murphy sign noted by sonographer. Common bile duct: Diameter: 2 mm Liver: Heterogeneous increased liver echotexture consistent with hepatic steatosis. No focal hepatic abnormality. No intrahepatic duct dilation. The portal venous gas visualized on CT yesterday is not well visualized by ultrasound. Portal vein is patent on color Doppler imaging with normal direction of blood flow towards the liver. Other: None. IMPRESSION: 1. Hepatic steatosis. 2. Otherwise unremarkable right upper quadrant ultrasound. Electronically Signed   By: Randa Ngo M.D.   On: 06/12/2021 15:25   ? ?Assessment  ?41 y.o. male presenting to the hospital with acute abdominal pain, N/V/D, with CT findings of portal venous gas in left lobe of liver and mass-like area in ascending colon. Sigmoid and ascending colon colitis. Surgery following as well.  ? ?N/V/D: Cdiff negative. GI path pending.  Diarrhea frequency and urgency have improved. He feels much improved in past 24 hours. Nausea resolved. Tolerating advancement to soft diet thus far.  ? ?Elevated LFTs: mildly elevated transaminases, now normalized. Acute hepatitis panel pending. [place] with  fatty liver. This fluctuation was likely due to acute illness in setting of fatty liver.  ? ?Possible ascending colon mass: noted on CT. Differentials including severe inflammation vs underlying neoplasm. Need

## 2021-06-13 NOTE — Discharge Summary (Addendum)
Physician Discharge Summary  ?Chase Lee GUY:[id number] DOB: [birth year] DOA: 06/11/2021 ? ?PCP: Leonie Douglas, MD ? ?Admit date: 06/11/2021 ? ?Discharge date: 06/13/2021 ? ?Admitted From:Home ? ?Disposition:  Home ? ?Recommendations for Outpatient Follow-up:  ?Follow up with PCP in 1-2 weeks ?Follow-up with general surgery and GI as scheduled ?Continue Imodium as needed for diarrhea ?Use ibuprofen as needed for pain relief ?Resume Coumadin as prior. ? ?Home Health: None ? ?Equipment/Devices: None ? ?Discharge Condition:Stable ? ?CODE STATUS: Full ? ?Diet recommendation: Heart Healthy ? ?Brief/Interim Summary: ?Per HPI: ?Chase Lee  is a 41 y.o. male reformed smoker with history of opiate dependence currently on Suboxone maintenance therapy, history of aortic valve replacement with mechanical valve at age 41 on chronic Coumadin therapy, HTN and anxiety disorder as well as GERD who presents to the ED with concerns about persistent abdominal pain since about 2 PM on 06/10/2021 associated with nausea and vomiting, emesis was at times bilious but no blood ?-Patient also had diarrhea ?No fever  Or chills  ?-In ED CT suggest colitis with possible ascending colon mass and Small amount of portal venous gas in the left lobe of the liver---??  Significance of this finding ?-General surgery and GI consult requested, radiology reread patient's films ?  ?3/26: Patient has been admitted with acute colitis as well as possible mass.  He remains on Rocephin and Flagyl with improvement in pain noted.  Slow dietary advancement per general surgery.  GI evaluating transaminitis with abdominal ultrasound and acute hepatitis panel.  Repeat a.m. labs.  ? ?3/27: Patient is in stable condition for discharge and is now tolerating diet.  He will follow-up with GI and general surgery outpatient.  Acute hepatitis panel pending.  Liver ultrasound with findings of steatohepatitis.  He is noted to have norovirus in his stool.  He will be followed  up for outpatient colonoscopy.  No other acute events noted during the course of this admission.  He is stable for discharge.  Resume Coumadin as prior. ? ?Discharge Diagnoses:  ?Principal Problem: ?  Colitis ?Active Problems: ?  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 ?  Disorder of hepatic portal vein/Small amount of portal venous gas in the left lobe of the liver ?  H/o Opiate Dependence/ On Suboxone maintenance therapy ?  Status post mechanical aortic valve replacement at Age 57 ? ?Principal discharge diagnosis: Possible ascending colon mass with norovirus gastroenteritis. ? ?Discharge Instructions ? ?Discharge Instructions   ? ? Diet - low sodium heart healthy   Complete by: As directed ?  ? Increase activity slowly   Complete by: As directed ?  ? ?  ? ?Allergies as of 06/13/2021   ?No Known Allergies ?  ? ?  ?Medication List  ?  ? ?STOP taking these medications   ? ?buprenorphine 8 MG Subl SL tablet ?Commonly known as: SUBUTEX ?  ? ?  ? ?TAKE these medications   ? ?albuterol 108 (90 Base) MCG/ACT inhaler ?Commonly known as: VENTOLIN HFA ?WITH SPACER - 1 puff as needed for cough ?  ?ALPRAZolam 1 MG tablet ?Commonly known as: Duanne Moron ?TAKE 1 TABLET(1 MG) BY MOUTH THREE TIMES DAILY AS NEEDED FOR ANXIETY ?  ?escitalopram 10 MG tablet ?Commonly known as: LEXAPRO ?TAKE 1 TABLET(10 MG) BY MOUTH DAILY ?  ?furosemide 40 MG tablet ?Commonly known as: LASIX ?Take 40 mg by mouth daily. ?  ?ibuprofen 800 MG tablet ?Commonly known as: ADVIL ?Take 1 tablet (800 mg total) by mouth every 8 (eight) hours as  needed. ?  ?lisinopril 20 MG tablet ?Commonly known as: ZESTRIL ?Take 20 mg by mouth daily. ?What changed: Another medication with the same name was removed. Continue taking this medication, and follow the directions you see here. ?  ?loperamide 2 MG tablet ?Commonly known as: Imodium A-D ?Take 1 tablet (2 mg total) by mouth 4 (four) times daily as needed for diarrhea or loose stools. ?  ?omeprazole 40 MG capsule ?Commonly known as:  PRILOSEC ?Take 1 capsule (40 mg total) by mouth daily. ?  ?warfarin 2.5 MG tablet ?Commonly known as: COUMADIN ?Take 2.5 mg by mouth See admin instructions. Take 2 mg with 2.5 mg at bedtime  for a total of 4.5 mg daily ?  ?warfarin 2 MG tablet ?Commonly known as: COUMADIN ?Take 2 mg by mouth See admin instructions. Take with 2.5 mg for total of 4.5 at bedtime ?  ? ?  ? ? Follow-up Information   ? ? Pappayliou, Catherine A, DO Follow up.   ?Specialty: General Surgery ?Why: Follow up as needed ?Contact information: ?[address] ?[place] [address] ?[phone] ? ? ?  ?  ? ? Leonie Douglas, MD. Schedule an appointment as soon as possible for a visit in 1 week(s).   ?Specialties: Family Medicine, Sports Medicine ?Contact information: ?[address] ?[place] [address] ?[phone] ? ? ?  ?  ? ? [facility]. Schedule an appointment as soon as possible for a visit today.   ?Contact information: ?[address] ?[place] ?[phone] ? ?  ?  ? ?  ?  ? ?  ? ?No Known Allergies ? ?Consultations: ?GI ?General surgery ? ? ?Procedures/Studies: ?CT Abdomen Pelvis W Contrast ? ?Result Date: 06/11/2021 ?CLINICAL DATA:  41 year old male with history of right lower quadrant abdominal pain and sharp epigastric pain. EXAM: CT ABDOMEN AND PELVIS WITH CONTRAST TECHNIQUE: Multidetector CT imaging of the abdomen and pelvis was performed using the standard protocol following bolus administration of intravenous contrast. RADIATION DOSE REDUCTION: This exam was performed according to the departmental dose-optimization program which includes automated exposure control, adjustment of the mA and/or kV according to patient size and/or use of iterative reconstruction technique. CONTRAST:  59m OMNIPAQUE IOHEXOL 300 MG/ML  SOLN COMPARISON:  No priors. 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 Hepatobiliary: There is a small amount of intrahepatic gas which is evident in the superior aspect of the left lobe of the liver, particularly notable adjacent to the left hepatic vein. On coronal views this appears to be rather peripheral in the liver, favored to be portal venous gas. Mild diffuse low attenuation throughout the hepatic parenchyma, indicative of a background of hepatic steatosis. No focal cystic or solid hepatic lesions. No intra or extrahepatic biliary ductal dilatation. Gallbladder is normal in appearance. Pancreas: No pancreatic mass. No pancreatic ductal dilatation. No pancreatic or peripancreatic fluid collections or inflammatory changes. Spleen: Unremarkable. Adrenals/Urinary Tract: Subcentimeter low-attenuation lesion in the right kidney, too small to characterize, but statistically likely to represent a tiny cyst. Left kidney and bilateral adrenal glands are normal in appearance. No hydroureteronephrosis. Urinary bladder is normal in appearance. Stomach/Bowel: Normal appearance of the stomach. No pathologic dilatation of small bowel or colon. There are several areas of mural thickening and mucosal hyperenhancement in the colon, including the sigmoid colon  and ascending colon. In the ascending colon the appearance is somewhat mass-like in some regions (reference axial image 51 of series 2). There is also slight haziness in the mesocolic fat adjacent to the ascending colon. Normal appendix. Vascular/Lymphatic: Atherosclerotic calcifications in the pelvic vasculature. No aneurysm or dissection noted in the abdominal or pelvic vasculature. No lymphadenopathy noted in the abdomen or pelvis. Surgical clips are noted in the right inguinal region. Reproductive: Prostate gland and seminal vesicles are unremarkable in appearance. Other: Trace volume of ascites in the low anatomic  pelvis. No pneumoperitoneum. Musculoskeletal: There are no aggressive appearing lytic or blastic lesions noted in the visualized portions of the skeleton. IMPRESSION: 1. Small amount of portal venous gas in

## 2021-06-14 NOTE — Telephone Encounter (Signed)
So, does he have an appointment on 3/30 or 4/24? If we have an early slot and he can come then better ?

## 2021-06-14 NOTE — Telephone Encounter (Signed)
Called [phone] Chase Lee. I asked that she please have patient to call our office. She states she will have the patient return call.  ?

## 2021-06-14 NOTE — Telephone Encounter (Signed)
Tried calling vm not set up. ?

## 2021-06-14 NOTE — Telephone Encounter (Signed)
Patient aware we had an opening for this Thursday 06/16/2021 at 9 am and aware to be here. He states his physician that is managing his coumadin is Doctor Leonie Douglas at [facility] in [place] . He reports his last inr was 3.1. He is currently taking coumadin 4.5 mg QHS, and it was done two weeks ago. Patient had been scheduled out to 07/11/2021. Is it ok to see him sooner as he says he needs a TCS. Please advise.  ?

## 2021-06-15 NOTE — Telephone Encounter (Signed)
Tomorrow is ok ?thanks ?

## 2021-06-15 NOTE — Telephone Encounter (Signed)
He will be here tomorrow, 06/16/2021, but then I thought he needed to be seen further out that is why I asked. But patient is aware to be at the office tomorrow, unless you wanted it pushed back further. ?

## 2021-06-16 ENCOUNTER — Ambulatory Visit (INDEPENDENT_AMBULATORY_CARE_PROVIDER_SITE_OTHER): Payer: Self-pay | Admitting: Gastroenterology

## 2021-06-29 ENCOUNTER — Ambulatory Visit (INDEPENDENT_AMBULATORY_CARE_PROVIDER_SITE_OTHER): Payer: Self-pay

## 2021-06-29 ENCOUNTER — Ambulatory Visit
Admission: EM | Admit: 2021-06-29 | Discharge: 2021-06-29 | Disposition: A | Discharge status: Home / Self Care | Payer: Self-pay | Attending: Family Medicine | Admitting: Family Medicine

## 2021-06-29 DIAGNOSIS — M25561 Pain in right knee: Secondary | ICD-10-CM

## 2021-06-29 DIAGNOSIS — W19XXXA Unspecified fall, initial encounter: Secondary | ICD-10-CM

## 2021-06-29 NOTE — ED Provider Notes (Signed)
?[place] ? ? ? ?CSN: [id number] ?Arrival date & time: 06/29/21  1648 ? ? ?  ? ?History   ?Chief Complaint ?Chief Complaint  ?Patient presents with  ? Knee Pain  ?  Right knee and leg pain and swelling  ? ? ?HPI ?Chase Lee is a 41 y.o. male.  ? 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  He states he twisted the leg awkwardly and then fell directly onto his knee.  He states has been able to walk on it but has a bit of a limp, particularly after a long day on his feet at work.  Also noticing some swelling to the right lower extremity which is not entirely abnormal for him.  Denies bruising, redness, numbness, tingling, weakness.  Has been taking Advil with mild temporary relief. ? ? ?Past Medical History:  ?Diagnosis Date  ? Anxiety   ? History of artificial heart valve 2008  ? surgery at 26  ? ? ?Patient Active Problem List  ? Diagnosis Date Noted  ? Right sided abdominal pain   ? Colitis 06/11/2021  ? H/o Opiate Dependence/ On Suboxone maintenance therapy 06/11/2021  ? 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 06/11/2021  ? 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 Status post mechanical aortic valve replacement at Age 1 06/11/2021  ? ? ?Past Surgical History:  ?Procedure Laterality Date  ? CARDIAC SURGERY    ? ? ? ? ? ?Home Medications   ? ?Prior to Admission medications   ?Medication Sig Start Date End Date Taking? Authorizing Provider  ?albuterol (VENTOLIN HFA) 108 (90 Base) MCG/ACT inhaler WITH SPACER - 1 puff as needed for cough 05/16/21   [provider]  ?ALPRAZolam (XANAX) 1 MG tablet TAKE 1 TABLET(1 MG) BY MOUTH THREE TIMES DAILY AS NEEDED FOR ANXIETY 04/22/21   Mozingo, Berdie Ogren, NP  ?escitalopram (LEXAPRO) 10 MG tablet TAKE 1 TABLET(10 MG) BY  MOUTH DAILY 04/22/21   Mozingo, Berdie Ogren, NP  ?furosemide (LASIX) 40 MG tablet Take 40 mg by mouth daily. 01/04/19   [provider]  ?ibuprofen (ADVIL) 800 MG tablet Take 1 tablet (800 mg total) by mouth every 8 (eight) hours as needed. 06/13/21   Manuella Ghazi, Pratik D, DO  ?lisinopril (ZESTRIL) 20 MG tablet Take 20 mg by mouth daily. 11/06/18   [provider]  ?loperamide (IMODIUM A-D) 2 MG tablet Take 1 tablet (2 mg total) by mouth 4 (four) times daily as needed for diarrhea or loose stools. 06/13/21   Manuella Ghazi, Pratik D, DO  ?omeprazole (PRILOSEC) 40 MG capsule Take 1 capsule (40 mg total) by mouth daily. 04/28/19   Emerson Monte, FNP  ?warfarin (COUMADIN) 2 MG tablet Take 2 mg by mouth See admin instructions. Take with 2.5 mg for total of 4.5 at bedtime 06/01/21   [provider]  ?warfarin (COUMADIN) 2.5 MG tablet Take 2.5 mg by mouth See admin instructions. Take 2 mg with 2.5 mg at bedtime  for a total of 4.5 mg daily 06/17/20   [provider]  ? ? ?Family History ?Family History  ?Problem Relation Age of Onset  ? Depression Mother   ? Cancer Mother   ? Cancer Father   ? ? ?Social History ?Social History  ? ?  Tobacco Use  ? Smoking status: Some Days  ?  Types: Cigarettes  ?  Last attempt to quit: 08/10/2019  ?  Years since quitting: 1.8  ?  Passive exposure: Never  ? Smokeless tobacco: Never  ?Vaping Use  ? Vaping Use: Never used  ?Substance Use Topics  ? Alcohol use: Never  ? Drug use: Never  ? ? ? ?Allergies   ?Patient has no known allergies. ? ? ?Review of Systems ?Review of Systems ?Per HPI ? ?Physical Exam ?Triage Vital Signs ?ED Triage Vitals  ?Enc Vitals Group  ?   BP 06/29/21 1653 116/75  ?   Pulse Rate 06/29/21 1653 63  ?   Resp 06/29/21 1653 20  ?   Temp 06/29/21 1653 98 ?F (36.7 ?C)  ?   Temp Source 06/29/21 1653 Oral  ?   SpO2 06/29/21 1653 95 %  ?   Weight --   ?   Height --   ?   Head Circumference --   ?   Peak Flow --   ?   Pain Score 06/29/21 1654 8  ?   Pain Loc  --   ?   Pain Edu? --   ?   Excl. in [place]? --   ? ?No data found. ? ?Updated Vital Signs ?BP 116/75 (BP Location: Right Arm)   Pulse 63   Temp 98 ?F (36.7 ?C) (Oral)   Resp 20   SpO2 95%  ? ?Visual Acuity ?Right Eye Distance:   ?Left Eye Distance:   ?Bilateral Distance:   ? ?Right Eye Near:   ?Left Eye Near:    ?Bilateral Near:    ? ?Physical Exam ?Vitals and nursing note reviewed.  ?Constitutional:   ?   Appearance: Normal appearance.  ?HENT:  ?   Head: Atraumatic.  ?Eyes:  ?   Extraocular Movements: Extraocular movements intact.  ?   Conjunctiva/sclera: Conjunctivae normal.  ?Cardiovascular:  ?   Rate and Rhythm: Normal rate and regular rhythm.  ?Pulmonary:  ?   Effort: Pulmonary effort is normal.  ?   Breath sounds: Normal breath sounds.  ?Musculoskeletal:     ?   General: Swelling, tenderness and signs of injury present. Normal range of motion.  ?   Cervical back: Normal range of motion and neck supple.  ?   Comments: Good range of motion in all directions right leg, normal gait, negative McMurray's, drawer testing.  No instability, strength full and equal bilateral lower extremities  ?Skin: ?   General: Skin is warm and dry.  ?   Findings: No bruising or erythema.  ?Neurological:  ?   General: No focal deficit present.  ?   Mental Status: He is oriented to person, place, and time.  ?Psychiatric:     ?   Mood and Affect: Mood normal.     ?   Thought Content: Thought content normal.     ?   Judgment: Judgment normal.  ? ? ? ?UC Treatments / Results  ?Labs ?(all labs ordered are listed, but only abnormal results are displayed) ?Labs Reviewed - No data to display ? ?EKG ? ? ?Radiology ?DG Knee Complete 4 Views Right ? ?Result Date: 06/29/2021 ?CLINICAL DATA:  Right knee pain for 1 week. Felt Mondays last Friday. EXAM: RIGHT KNEE - COMPLETE 4+ VIEW COMPARISON:  None. FINDINGS: Mild medial compartment joint space narrowing. Mild patellofemoral joint space narrowing. Small joint effusion. Mild superior and inferior  patellar degenerative osteophytosis. No acute  fracture or dislocation. IMPRESSION: Mild medial compartment and patellofemoral compartment chronic osteoarthritis. Electronically Signed   By: Yvonne Kendall M.D.   On: 06/29/2021 17:40   ? ?Procedures ?Procedures (including critical care time) ? ?Medications Ordered in UC ?Medications - No data to display ? ?Initial Impression / Assessment and Plan / UC Course  ?I have reviewed the triage vital signs and the nursing notes. ? ?Pertinent labs & imaging results that were available during my care of the patient were reviewed by me and considered in my medical decision making (see chart for details). ? ?  ? ?X-ray of the right knee showing chronic arthritis changes, no acute bony abnormality.  Exam findings very reassuring.  Discussed RICE protocol, over-the-counter pain relievers and follow-up with orthopedics or sports medicine if pain not improving for further imaging.  Work note given. ? ?Final Clinical Impressions(s) / UC Diagnoses  ? ?Final diagnoses:  ?Acute pain of right knee  ?Fall, initial encounter  ? ?Discharge Instructions   ?None ?  ? ?ED Prescriptions   ?None ?  ? ?PDMP not reviewed this encounter. ?  ?Volney American, PA-C ?06/29/21 1814 ? ?

## 2021-06-29 NOTE — ED Triage Notes (Signed)
Pt states his right knee and leg began hurting after he fell at [facility] on last Friday ? ?Pt states that after that fall he has been unable to bend the right leg and there is pain to the touch at his calf muscle  ? ?Pt states he tried Advil with little relief ?

## 2021-07-11 ENCOUNTER — Encounter (INDEPENDENT_AMBULATORY_CARE_PROVIDER_SITE_OTHER): Payer: Self-pay

## 2021-07-11 ENCOUNTER — Encounter (INDEPENDENT_AMBULATORY_CARE_PROVIDER_SITE_OTHER): Payer: Self-pay | Admitting: Gastroenterology

## 2021-07-11 ENCOUNTER — Ambulatory Visit (INDEPENDENT_AMBULATORY_CARE_PROVIDER_SITE_OTHER): Payer: Self-pay | Admitting: Gastroenterology

## 2021-07-11 ENCOUNTER — Other Ambulatory Visit (INDEPENDENT_AMBULATORY_CARE_PROVIDER_SITE_OTHER): Payer: Self-pay

## 2021-07-11 ENCOUNTER — Telehealth (INDEPENDENT_AMBULATORY_CARE_PROVIDER_SITE_OTHER): Payer: Self-pay

## 2021-07-11 VITALS — BP 129/82 | HR 57 | Temp 98.3°F | Ht 67.0 in | Wt 232.7 lb

## 2021-07-11 DIAGNOSIS — K6389 Other specified diseases of intestine: Secondary | ICD-10-CM

## 2021-07-11 DIAGNOSIS — K529 Noninfective gastroenteritis and colitis, unspecified: Secondary | ICD-10-CM

## 2021-07-11 MED ORDER — PEG 3350-KCL-NA BICARB-NACL 420 G PO SOLR: 4000.0000 mL | ORAL | 0 refills | Status: DC

## 2021-07-11 NOTE — Progress Notes (Signed)
Maylon Peppers, M.D. ?Gastroenterology & Hepatology ?[facility] ?[address] ?[place], [place] [address] ? ?Primary Care Physician: ?Leonie Douglas, MD ?[address] ?[place] [address] ? ?I will communicate my assessment and recommendations to the referring MD via EMR. ? ?Problems: ?Acute gastroenteritis ? ?History of Present Illness: ?Berlyn Malina is a 41 y.o. male with history of anxiety and aortic valve replacement on Coumadin and possible NASH, who comes for follow up after recent hospitalization for gastroenteritis. ? ?The patient was last seen on 05/22/21 [facility]. At that time, the patient was admitted after presenting acute onset of symptoms suggestive of acute gastroenteritis. He was monitored in the hospital as he presented free air in the portal area, but there was no abnormalitites suggestive of perforated viscus. He was given IV ab and his diet was advanced adequately. He was found to have norovirus and he was discharged off antibiotics. Notably, in CT scan there was presence of possible mass vs inflammatory changes in the ascending colon. ? ?During his hospitalization he was found to have elevation of his aminotransferases, with last labs  showed AST 30 ALT 44. 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 ? ?Patient reports that since he left the hospital, he felt his abdominal pain resolved after 3 days. States he presented some persistent loose stools, but more recently he has had more formed stools for the last 3 days. He has noticed some urgency to have a BM, usually is having one 1 BM once a day in the AM. Does not take any Imodium. ? ?He was prescribed antibiotics for 2 weeks. Did not take any probiotics. ? ?He has tried stopping cigarette smoking. ? ?The patient denies having any nausea, vomiting, fever, chills, hematochezia, melena, hematemesis,  jaundice, pruritus or weight loss. ? ?Last EGD: never ?Last Colonoscopy:  never ? ?Father had ulcerative colitis. Mother breast cancer. ? ?Past Medical History: ?Past Medical History:  ?Diagnosis Date  ? Anxiety   ? History of artificial heart valve 2008  ? surgery at 24  ? ? ?Past Surgical History: ?Past Surgical History:  ?Procedure Laterality Date  ? CARDIAC SURGERY    ? ? ?Family History: ?Family History  ?Problem Relation Age of Onset  ? Depression Mother   ? Cancer Mother   ? Cancer Father   ? ? ?Social History: ?Social History  ? ?Tobacco Use  ?Smoking Status Some Days  ? Types: Cigarettes  ? Last attempt to quit: 08/10/2019  ? Years since quitting: 1.9  ? Passive exposure: Never  ?Smokeless Tobacco Never  ? ?Social History  ? ?Substance and Sexual Activity  ?Alcohol Use Never  ? ?Social History  ? ?Substance and Sexual Activity  ?Drug Use Never  ? ? ?Allergies: ?No Known Allergies ? ?Medications: ?Current Outpatient Medications  ?Medication Sig Dispense Refill  ? albuterol (VENTOLIN HFA) 108 (90 Base) MCG/ACT inhaler WITH SPACER - 1 puff as needed for cough    ? ALPRAZolam (XANAX) 1 MG tablet TAKE 1 TABLET(1 MG) BY MOUTH THREE TIMES DAILY AS NEEDED FOR ANXIETY 90 tablet 2  ? buprenorphine (SUBUTEX) 8 MG SUBL SL tablet Place 8 mg under the tongue daily. Takes 1/4 per day.    ? escitalopram (LEXAPRO) 10 MG tablet TAKE 1 TABLET(10 MG) BY MOUTH DAILY 30 tablet 5  ? furosemide (LASIX) 40 MG tablet Take 40 mg by mouth daily.    ? ibuprofen (ADVIL) 800 MG tablet Take 1 tablet (800 mg total)  by mouth every 8 (eight) hours as needed. 30 tablet 0  ? lisinopril (ZESTRIL) 20 MG tablet Take 20 mg by mouth daily.    ? loperamide (IMODIUM A-D) 2 MG tablet Take 1 tablet (2 mg total) by mouth 4 (four) times daily as needed for diarrhea or loose stools. 30 tablet 0  ? omeprazole (PRILOSEC) 40 MG capsule Take 1 capsule (40 mg total) by mouth daily. 60 capsule 0  ? warfarin (COUMADIN) 2 MG tablet Take 2 mg by mouth See admin instructions. Take with 2.5 mg for total of 4.5 at bedtime    ? warfarin  (COUMADIN) 2.5 MG tablet Take 2.5 mg by mouth See admin instructions. Take 2 mg with 2.5 mg at bedtime  for a total of 4.5 mg daily    ? ?No current facility-administered medications for this visit.  ? ? ?Review of Systems: ?GENERAL: negative for malaise, night sweats ?HEENT: No changes in hearing or vision, no nose bleeds or other nasal problems. ?NECK: Negative for lumps, goiter, pain and significant neck swelling ?RESPIRATORY: Negative for cough, wheezing ?CARDIOVASCULAR: Negative for chest pain, leg swelling, palpitations, orthopnea ?GI: SEE HPI ?MUSCULOSKELETAL: Negative for joint pain or swelling, back pain, and muscle pain. ?SKIN: Negative for lesions, rash ?PSYCH: Negative for sleep disturbance, mood disorder and recent psychosocial stressors. ?HEMATOLOGY Negative for prolonged bleeding, bruising easily, and swollen nodes. ?ENDOCRINE: Negative for cold or heat intolerance, polyuria, polydipsia and goiter. ?NEURO: negative for tremor, gait imbalance, syncope and seizures. ?The remainder of the review of systems is noncontributory. ? ? ?Physical Exam: ?BP 129/82 (BP Location: Right Arm, Patient Position: Sitting, Cuff Size: Large)   Pulse (!) 57   Temp 98.3 ?F (36.8 ?C) (Oral)   Ht 5' 7"  (1.702 m)   Wt 232 lb 11.2 oz (105.6 kg)   BMI 36.45 kg/m?  ?GENERAL: The patient is AO x3, in no acute distress. Obese. ?HEENT: Head is normocephalic and atraumatic. EOMI are intact. Mouth is well hydrated and without lesions. ?NECK: Supple. No masses ?LUNGS: Clear to auscultation. No presence of rhonchi/wheezing/rales. Adequate chest expansion ?HEART: RRR, normal s1 and s2. ?ABDOMEN: Soft, nontender, no guarding, no peritoneal signs, and nondistended. BS +. No masses. ?EXTREMITIES: Without any cyanosis, clubbing, rash, lesions or edema. ?NEUROLOGIC: AOx3, no focal motor deficit. ?SKIN: no jaundice, no rashes ? ?Imaging/Labs: ?as above ? ?I personally reviewed and interpreted the available labs, imaging and endoscopic  files. ? ?Impression and Plan: ?Danthony Kendrix is a 41 y.o. male with history of anxiety and aortic valve replacement on Coumadin and possible NASH, who comes for follow up after recent hospitalization for gastroenteritis. He had an infection with norovirus, which was initially concerning as he had free air near the portal area. Nevertheless there was no concern for perforated viscus, which was reassuring. He had complete resolution of the symptoms, although some of his stools are still loose - he may benefit from taking some probiotics if this persists. ? ?Given the concern of a possible mass in the [facility], we will need to explore this further with a colonoscopy. I explained to the patient that given his anticoagulation and metalic valve, we could perform this two ways: whole on systemic AC without interrupting his coumadin at all but without the possibility of performing polypectomies, or performing it while hospitalized with coumadin/heparin bridging. He would like to perform it as outpatient without stopping his coumadin , with the caveat that no polypectomies will be performed. ? ?- Schedule colonoscopy ?- Can start probiotics  if persistent loose stools ? ?All questions were answered.     ? ?Harvel Quale, MD ?Gastroenterology and Hepatology ?[facility] ? ?

## 2021-07-11 NOTE — Telephone Encounter (Signed)
Jayden Rudge Ann Dariane Natzke, CMA  ?

## 2021-07-11 NOTE — Patient Instructions (Signed)
Schedule colonoscopy

## 2021-07-12 ENCOUNTER — Encounter (INDEPENDENT_AMBULATORY_CARE_PROVIDER_SITE_OTHER): Payer: Self-pay

## 2021-07-18 ENCOUNTER — Ambulatory Visit: Payer: Self-pay | Admitting: Adult Health

## 2021-07-21 ENCOUNTER — Other Ambulatory Visit: Payer: Self-pay | Admitting: Adult Health

## 2021-07-21 ENCOUNTER — Telehealth: Payer: Self-pay | Admitting: Adult Health

## 2021-07-21 DIAGNOSIS — F411 Generalized anxiety disorder: Secondary | ICD-10-CM

## 2021-07-21 DIAGNOSIS — F410 Panic disorder [episodic paroxysmal anxiety] without agoraphobia: Secondary | ICD-10-CM

## 2021-07-21 MED ORDER — ALPRAZOLAM 1 MG PO TABS: ORAL_TABLET | ORAL | 2 refills | Status: DC

## 2021-07-21 NOTE — Telephone Encounter (Signed)
Script sent  

## 2021-07-21 NOTE — Telephone Encounter (Signed)
Patient called with refill request for Alprazolam 39m. State that he won't need until Monday but will run out before appt. [facility] Boards[facility][phone]. Appt 5/10 ?

## 2021-07-27 ENCOUNTER — Encounter: Payer: Self-pay | Admitting: Adult Health

## 2021-07-27 ENCOUNTER — Ambulatory Visit (INDEPENDENT_AMBULATORY_CARE_PROVIDER_SITE_OTHER): Payer: Self-pay | Admitting: Adult Health

## 2021-07-27 DIAGNOSIS — F411 Generalized anxiety disorder: Secondary | ICD-10-CM

## 2021-07-27 DIAGNOSIS — F331 Major depressive disorder, recurrent, moderate: Secondary | ICD-10-CM

## 2021-07-27 DIAGNOSIS — F410 Panic disorder [episodic paroxysmal anxiety] without agoraphobia: Secondary | ICD-10-CM

## 2021-07-27 NOTE — Progress Notes (Signed)
Chase Lee ?[id number] ?[birth year] ?41 y.o. ? ?Subjective:  ? ?Patient ID:  Chase Lee is a 41 y.o. (DOB [birth year]) male. ? ?Chief Complaint: No chief complaint on file. ? ? ?HPI ?Rylon Poitra presents to the office today for follow-up of MDD, panic attacks and GAD.  ? ?Describes mood today as "ok". Pleasant. Denies tearfulness. Mood symptoms - denies depression - feels guilty about some things he's done. Feels anxious and irritable. Has stopped suboxone - experiencing some withdrawal symptoms - 5 days out. Stating "I'm pushing through things". Feels like medications continue to work well for him. He and wife doing well.  Stable interest and motivation. Taking medications as prescribed.  ?Energy levels stable. Active, has a regular exercise routine.  ?Enjoys some usual interests and activities. Married. Lives with wife. Family in [place]. Spending time with family. ?Appetite adequate. Weight loss 228 from 232 pounds.  ?Sleeps well most nights. Averages 6 to 8 hours. ?Focus and concentration stable. Completing tasks. Managing aspects of household. 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 ?Denies SI or HI.  ?Denies AH or VH. ? ?Previous medications: Prozac, Zoloft ? ? ?[name] from 06/29/2021 in [facility] ED to Hosp-Admission (Discharged) from 06/11/2021 in [place]  ?C-SSRS RISK CATEGORY No Risk No Risk  ? ?  ?  ? ?Review of Systems:  ?Review of Systems  ?Musculoskeletal:  Negative for gait problem.  ?Neurological:  Negative for tremors.  ?Psychiatric/Behavioral:    ?     Please refer to HPI  ? ?Medications: I have reviewed the patient's current medications. ? ?Current Outpatient Medications  ?Medication Sig Dispense Refill  ? albuterol (VENTOLIN HFA) 108 (90 Base) MCG/ACT inhaler WITH SPACER - 1 puff as needed for cough    ? ALPRAZolam (XANAX) 1 MG tablet TAKE 1 TABLET(1 MG) BY MOUTH THREE TIMES DAILY AS NEEDED FOR ANXIETY 90 tablet 2  ? buprenorphine (SUBUTEX) 8  MG SUBL SL tablet Place 8 mg under the tongue daily. Takes 1/4 per day.    ? escitalopram (LEXAPRO) 10 MG tablet TAKE 1 TABLET(10 MG) BY MOUTH DAILY 30 tablet 5  ? furosemide (LASIX) 40 MG tablet Take 40 mg by mouth daily.    ? ibuprofen (ADVIL) 800 MG tablet Take 1 tablet (800 mg total) by mouth every 8 (eight) hours as needed. 30 tablet 0  ? lisinopril (ZESTRIL) 20 MG tablet Take 20 mg by mouth daily.    ? loperamide (IMODIUM A-D) 2 MG tablet Take 1 tablet (2 mg total) by mouth 4 (four) times daily as needed for diarrhea or loose stools. 30 tablet 0  ? omeprazole (PRILOSEC) 40 MG capsule Take 1 capsule (40 mg total) by mouth daily. 60 capsule 0  ? polyethylene glycol-electrolytes (TRILYTE) 420 g solution Take 4,000 mLs by mouth as directed. 4000 mL 0  ? warfarin (COUMADIN) 2 MG tablet Take 2 mg by mouth See admin instructions. Take with 2.5 mg for total of 4.5 at bedtime    ? warfarin (COUMADIN) 2.5 MG tablet Take 2.5 mg by mouth See admin instructions. Take 2 mg with 2.5 mg at bedtime  for a total of 4.5 mg daily    ? ?No current facility-administered medications for this visit.  ? ? ?Medication Side Effects: None ? ?Allergies: No Known Allergies ? ?Past Medical History:  ?Diagnosis Date  ? Anxiety   ? History of artificial heart valve 2008  ? surgery at 73  ? ? ?Past  Medical History, Surgical history, Social history, and Family history were reviewed and updated as appropriate.  ? ?Please see review of systems for further details on the patient's review from today.  ? ?Objective:  ? ?Physical Exam:  ?There were no vitals taken for this visit. ? ?Physical Exam ?Constitutional:   ?   General: He is not in acute distress. ?Musculoskeletal:     ?   General: No deformity.  ?Neurological:  ?   Mental Status: He is alert and oriented to person, place, and time.  ?   Coordination: Coordination normal.  ?Psychiatric:     ?   Attention and Perception: Attention and perception normal. He does not perceive auditory or visual  hallucinations.     ?   Mood and Affect: Mood normal. Mood is not anxious or depressed. Affect is not labile, blunt, angry or inappropriate.     ?   Speech: Speech normal.     ?   Behavior: Behavior normal.     ?   Thought Content: Thought content normal. Thought content is not paranoid or delusional. Thought content does not include homicidal or suicidal ideation. Thought content does not include homicidal or suicidal plan.     ?   Cognition and Memory: Cognition and memory normal.     ?   Judgment: Judgment normal.  ?   Comments: Insight intact  ? ? ?Lab Review:  ?   ?Component Value Date/Time  ? NA 138 06/13/2021 0553  ? K 3.5 06/13/2021 0553  ? CL 109 06/13/2021 0553  ? CO2 24 06/13/2021 0553  ? GLUCOSE 112 (H) 06/13/2021 0553  ? BUN 10 06/13/2021 0553  ? CREATININE 0.86 06/13/2021 0553  ? CALCIUM 7.6 (L) 06/13/2021 0553  ? PROT 6.2 (L) 06/13/2021 0553  ? ALBUMIN 3.7 06/13/2021 0553  ? AST 30 06/13/2021 0553  ? ALT 44 06/13/2021 0553  ? ALKPHOS 60 06/13/2021 0553  ? BILITOT 0.5 06/13/2021 0553  ? GFRNONAA >60 06/13/2021 0553  ? GFRAA >60 08/12/2019 0734  ? ? ?   ?Component Value Date/Time  ? WBC 8.9 06/13/2021 0553  ? RBC 4.48 06/13/2021 0553  ? HGB 12.7 (L) 06/13/2021 0553  ? HCT 39.3 06/13/2021 0553  ? PLT 147 (L) 06/13/2021 0553  ? MCV 87.7 06/13/2021 0553  ? MCH 28.3 06/13/2021 0553  ? MCHC 32.3 06/13/2021 0553  ? RDW 13.6 06/13/2021 0553  ? ? ?No results found for: POCLITH, LITHIUM  ? ?No results found for: PHENYTOIN, PHENOBARB, VALPROATE, CBMZ  ? ?.res ?Assessment: Plan:   ? ?Plan: ? ?Lexapro 54m at hs ?Xanax 120mTID ? ?RTC 3 months ? ?Patient advised to contact office with any questions, adverse effects, or acute worsening in signs and symptoms. ? ?Discussed potential benefits, risk, and side effects of benzodiazepines to include potential risk of tolerance and dependence, as well as possible drowsiness.  Advised patient not to drive if experiencing drowsiness and to take lowest possible effective dose  to minimize risk of dependence and tolerance. ? ?Diagnoses and all orders for this visit: ? ?Generalized anxiety disorder ? ?Panic attacks ? ?Major depressive disorder, recurrent episode, moderate ([facility]? ?  ? ?Please see After Visit Summary for patient specific instructions. ? ?Future Appointments  ?Date Time Provider [place]?08/11/2021  8:00 AM [facility] PAT 1 [facility] None  ? ? ?No orders of the defined types were placed in this encounter. ? ? ?------------------------------- ?

## 2021-08-09 NOTE — Patient Instructions (Signed)
   Your procedure is scheduled on: 08/16/2021  Report to [place] [facility] at    8:00 AM.  Call this number if you have problems the morning of surgery: [phone]   Remember:              Follow Directions on the letter you received from Your Physician's office regarding the Bowel Prep              No Smoking the day of Procedure :   Take these medicines the morning of surgery with A SIP OF WATER: XANAX, LEXAPRO, AND OMEPRAZOLE  FOLLOW INSTRUCTIONS REGARDING COUMADIN   Do not wear jewelry, make-up or nail polish.    Do not bring valuables to the hospital.  Contacts, dentures or bridgework may not be worn into surgery.  .   Patients discharged the day of surgery will not be allowed to drive home.     Colonoscopy, Adult, Care After This sheet gives you information about how to care for yourself after your procedure. Your health care provider may also give you more specific instructions. If you have problems or questions, contact your health care provider. What can I expect after the procedure? After the procedure, it is common to have: A small amount of blood in your stool for 24 hours after the procedure. Some gas. Mild abdominal cramping or bloating.  Follow these instructions at home: General instructions  For the first 24 hours after the procedure: Do not drive or use machinery. Do not sign important documents. Do not drink alcohol. Do your regular daily activities at a slower pace than normal. Eat soft, easy-to-digest foods. Rest often. Take over-the-counter or prescription medicines only as told by your health care provider. It is up to you to get the results of your procedure. Ask your health care provider, or the department performing the procedure, when your results will be ready. Relieving cramping and bloating Try walking around when you have cramps or feel bloated. Apply heat to your abdomen as told by your health care provider. Use a heat source that  your health care provider recommends, such as a moist heat pack or a heating pad. Place a towel between your skin and the heat source. Leave the heat on for 20-30 minutes. Remove the heat if your skin turns bright red. This is especially important if you are unable to feel pain, heat, or cold. You may have a greater risk of getting burned. Eating and drinking Drink enough fluid to keep your urine clear or pale yellow. Resume your normal diet as instructed by your health care provider. Avoid heavy or fried foods that are hard to digest. Avoid drinking alcohol for as long as instructed by your health care provider. Contact a health care provider if: You have blood in your stool 2-3 days after the procedure. Get help right away if: You have more than a small spotting of blood in your stool. You pass large blood clots in your stool. Your abdomen is swollen. You have nausea or vomiting. You have a fever. You have increasing abdominal pain that is not relieved with medicine. This information is not intended to replace advice given to you by your health care provider. Make sure you discuss any questions you have with your health care provider. Document Released: 10/19/2003 Document Revised: 11/29/2015 Document Reviewed: 05/18/2015 Elsevier Interactive Patient Education  [employer].

## 2021-08-11 ENCOUNTER — Encounter (HOSPITAL_COMMUNITY)
Admission: RE | Admit: 2021-08-11 | Discharge: 2021-08-11 | Disposition: A | Discharge status: Home / Self Care | Payer: Self-pay | Source: Ambulatory Visit | Attending: Gastroenterology | Admitting: Gastroenterology

## 2021-08-11 ENCOUNTER — Encounter (HOSPITAL_COMMUNITY): Payer: Self-pay

## 2021-08-11 DIAGNOSIS — I879 Disorder of vein, unspecified: Secondary | ICD-10-CM

## 2021-08-16 ENCOUNTER — Encounter (HOSPITAL_COMMUNITY): Admission: RE | Payer: Self-pay | Source: Home / Self Care

## 2021-08-16 ENCOUNTER — Ambulatory Visit (HOSPITAL_COMMUNITY): Admission: RE | Admit: 2021-08-16 | Payer: Self-pay | Source: Home / Self Care | Admitting: Gastroenterology

## 2021-08-16 SURGERY — COLONOSCOPY WITH PROPOFOL
Anesthesia: Monitor Anesthesia Care

## 2021-10-19 ENCOUNTER — Telehealth: Payer: Self-pay | Admitting: Adult Health

## 2021-10-19 NOTE — Telephone Encounter (Signed)
Last filled 7/6, due 8/3

## 2021-10-19 NOTE — Telephone Encounter (Signed)
Pt called requesting RF 8/6 on Alprazolam due 8/6.  [facility] [address]na Hoff. [address]

## 2021-10-20 ENCOUNTER — Other Ambulatory Visit: Payer: Self-pay

## 2021-10-20 DIAGNOSIS — F411 Generalized anxiety disorder: Secondary | ICD-10-CM

## 2021-10-20 DIAGNOSIS — F410 Panic disorder [episodic paroxysmal anxiety] without agoraphobia: Secondary | ICD-10-CM

## 2021-10-20 MED ORDER — ALPRAZOLAM 1 MG PO TABS: ORAL_TABLET | ORAL | 2 refills | Status: DC

## 2021-10-20 NOTE — Telephone Encounter (Signed)
Pended.

## 2021-10-26 ENCOUNTER — Ambulatory Visit (INDEPENDENT_AMBULATORY_CARE_PROVIDER_SITE_OTHER): Payer: Self-pay | Admitting: Adult Health

## 2021-10-26 ENCOUNTER — Encounter: Payer: Self-pay | Admitting: Adult Health

## 2021-10-26 DIAGNOSIS — F410 Panic disorder [episodic paroxysmal anxiety] without agoraphobia: Secondary | ICD-10-CM

## 2021-10-26 DIAGNOSIS — F422 Mixed obsessional thoughts and acts: Secondary | ICD-10-CM

## 2021-10-26 DIAGNOSIS — F331 Major depressive disorder, recurrent, moderate: Secondary | ICD-10-CM

## 2021-10-26 DIAGNOSIS — F411 Generalized anxiety disorder: Secondary | ICD-10-CM

## 2021-10-26 MED ORDER — ESCITALOPRAM OXALATE 10 MG PO TABS: ORAL_TABLET | ORAL | 5 refills | Status: DC

## 2021-10-26 NOTE — Progress Notes (Signed)
Chase Lee [id number] [birth year] 41 y.o.  Subjective:   Patient ID:  Chase Lee is a 41 y.o. (DOB 05/09/1980) male.  Chief Complaint: No chief complaint on file.   HPI Chase Lee presents to the office today for follow-up of MDD, panic attacks and GAD.   Describes mood today as "ok". Pleasant. Denies tearfulness. Mood symptoms - denies depression and anxiety. Feels irritable at times. Mood is consistent. Stating "I'm doing alright". Feels like medications continue to work well for him. Reporting decreased withdrawal symptoms after discontinuing Suboxone. 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 He and wife doing well.  Stable interest and motivation. Taking medications as prescribed.  Energy levels stable. Active, has a regular exercise routine.  Enjoys some usual interests and activities. Married. Lives with wife. Family in [place]. Spending time with family. Appetite adequate. Weight loss 223 pounds.  Sleeps well most nights. Averages 6 to 8 hours. Focus and concentration stable. Completing tasks. Managing aspects of household. 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  Denies SI or HI.  Denies AH or VH. Denies self harm. Denies substance use.   Previous medications: Prozac, Zoloft   Flowsheet Row ED from 06/29/2021 in [facility] at [facility] ED to Hosp-Admission (Discharged) from 06/11/2021 in [place] No Risk No Risk        Review of Systems:  Review of Systems  Musculoskeletal:  Negative for gait problem.  Neurological:  Negative for tremors.  Psychiatric/Behavioral:         Please refer to HPI    Medications: I have reviewed the patient's current medications.  Current Outpatient Medications  Medication Sig Dispense Refill   albuterol (VENTOLIN HFA) 108 (90 Base) MCG/ACT inhaler WITH SPACER - 1 puff as needed for cough     ALPRAZolam (XANAX) 1 MG tablet TAKE 1 TABLET(1 MG) BY MOUTH THREE TIMES  DAILY AS NEEDED FOR ANXIETY 90 tablet 2   buprenorphine (SUBUTEX) 8 MG SUBL SL tablet Place 8 mg under the tongue daily. Takes 1/4 per day.     escitalopram (LEXAPRO) 10 MG tablet TAKE 1 TABLET(10 MG) BY MOUTH DAILY 30 tablet 5   furosemide (LASIX) 40 MG tablet Take 40 mg by mouth daily.     ibuprofen (ADVIL) 800 MG tablet Take 1 tablet (800 mg total) by mouth every 8 (eight) hours as needed. 30 tablet 0   lisinopril (ZESTRIL) 20 MG tablet Take 20 mg by mouth daily.     loperamide (IMODIUM A-D) 2 MG tablet Take 1 tablet (2 mg total) by mouth 4 (four) times daily as needed for diarrhea or loose stools. 30 tablet 0   omeprazole (PRILOSEC) 40 MG capsule Take 1 capsule (40 mg total) by mouth daily. 60 capsule 0   polyethylene glycol-electrolytes (TRILYTE) 420 g solution Take 4,000 mLs by mouth as directed. 4000 mL 0   warfarin (COUMADIN) 2 MG tablet Take 2 mg by mouth See admin instructions. Take with 2.5 mg for total of 4.5 at bedtime     warfarin (COUMADIN) 2.5 MG tablet Take 2.5 mg by mouth See admin instructions. Take 2 mg with 2.5 mg at bedtime  for a total of 4.5 mg daily     No current facility-administered medications for this visit.    Medication Side Effects: None  Allergies: No Known Allergies  Past Medical History:  Diagnosis Date   Anxiety    History of artificial heart valve 2008  surgery at 28    Past Medical History, Surgical history, Social history, and Family history were reviewed and updated as appropriate.   Please see review of systems for further details on the patient's review from today.   Objective:   Physical Exam:  There were no vitals taken for this visit.  Physical Exam Constitutional:      General: He is not in acute distress. Musculoskeletal:        General: No deformity.  Neurological:     Mental Status: He is alert and oriented to person, place, and time.     Coordination: Coordination normal.  Psychiatric:        Attention and Perception:  Attention and perception normal. He does not perceive auditory or visual hallucinations.        Mood and Affect: Mood normal. Mood is not anxious or depressed. Affect is not labile, blunt, angry or inappropriate.        Speech: Speech normal.        Behavior: Behavior normal.        Thought Content: Thought content normal. Thought content is not paranoid or delusional. Thought content does not include homicidal or suicidal ideation. Thought content does not include homicidal or suicidal plan.        Cognition and Memory: Cognition and memory normal.        Judgment: Judgment normal.     Comments: Insight intact     Lab Review:     Component Value Date/Time   NA 138 06/13/2021 0553   K 3.5 06/13/2021 0553   CL 109 06/13/2021 0553   CO2 24 06/13/2021 0553   GLUCOSE 112 (H) 06/13/2021 0553   BUN 10 06/13/2021 0553   CREATININE 0.86 06/13/2021 0553   CALCIUM 7.6 (L) 06/13/2021 0553   PROT 6.2 (L) 06/13/2021 0553   ALBUMIN 3.7 06/13/2021 0553   AST 30 06/13/2021 0553   ALT 44 06/13/2021 0553   ALKPHOS 60 06/13/2021 0553   BILITOT 0.5 06/13/2021 0553   GFRNONAA >60 06/13/2021 0553   GFRAA >60 08/12/2019 0734       Component Value Date/Time   WBC 8.9 06/13/2021 0553   RBC 4.48 06/13/2021 0553   HGB 12.7 (L) 06/13/2021 0553   HCT 39.3 06/13/2021 0553   PLT 147 (L) 06/13/2021 0553   MCV 87.7 06/13/2021 0553   MCH 28.3 06/13/2021 0553   MCHC 32.3 06/13/2021 0553   RDW 13.6 06/13/2021 0553    No results found for: "POCLITH", "LITHIUM"   No results found for: "PHENYTOIN", "PHENOBARB", "VALPROATE", "CBMZ"   .res Assessment: Plan:    Plan:  Lexapro 40m at hs Xanax 157mTID  RTC 3 months  Patient advised to contact office with any questions, adverse effects, or acute worsening in signs and symptoms.  Discussed potential benefits, risk, and side effects of benzodiazepines to include potential risk of tolerance and dependence, as well as possible drowsiness.  Advised  patient not to drive if experiencing drowsiness and to take lowest possible effective dose to minimize risk of dependence and tolerance. There are no diagnoses linked to this encounter.   Please see After Visit Summary for patient specific instructions.  No future appointments.  No orders of the defined types were placed in this encounter.   -------------------------------

## 2022-01-20 ENCOUNTER — Other Ambulatory Visit: Payer: Self-pay

## 2022-01-20 ENCOUNTER — Telehealth: Payer: Self-pay | Admitting: Adult Health

## 2022-01-20 DIAGNOSIS — F410 Panic disorder [episodic paroxysmal anxiety] without agoraphobia: Secondary | ICD-10-CM

## 2022-01-20 DIAGNOSIS — F411 Generalized anxiety disorder: Secondary | ICD-10-CM

## 2022-01-20 MED ORDER — ALPRAZOLAM 1 MG PO TABS: ORAL_TABLET | ORAL | 0 refills | Status: DC

## 2022-01-20 NOTE — Telephone Encounter (Signed)
Pt called for refill of Alprazolam to [facility] [address] in [place].  Next appt 11/8

## 2022-01-20 NOTE — Telephone Encounter (Signed)
Pended.

## 2022-01-25 ENCOUNTER — Encounter: Payer: Self-pay | Admitting: Adult Health

## 2022-01-25 ENCOUNTER — Telehealth (INDEPENDENT_AMBULATORY_CARE_PROVIDER_SITE_OTHER): Payer: 59 | Admitting: Adult Health

## 2022-01-25 DIAGNOSIS — F331 Major depressive disorder, recurrent, moderate: Secondary | ICD-10-CM | POA: Diagnosis not present

## 2022-01-25 DIAGNOSIS — F411 Generalized anxiety disorder: Secondary | ICD-10-CM

## 2022-01-25 DIAGNOSIS — F410 Panic disorder [episodic paroxysmal anxiety] without agoraphobia: Secondary | ICD-10-CM | POA: Diagnosis not present

## 2022-01-25 MED ORDER — ESCITALOPRAM OXALATE 10 MG PO TABS: ORAL_TABLET | ORAL | 5 refills | Status: DC

## 2022-01-25 MED ORDER — ALPRAZOLAM 1 MG PO TABS: ORAL_TABLET | ORAL | 2 refills | Status: DC

## 2022-01-25 NOTE — Progress Notes (Signed)
Chase Lee [id number] [birth year] 41 y.o.  Virtual Visit via Video Note  I connected with pt @ on 01/25/22 at  9:40 AM EST by a video enabled telemedicine application and verified that I am speaking with the correct person using two identifiers.   I discussed the limitations of evaluation and management by telemedicine and the availability of in person appointments. The patient expressed understanding and agreed to proceed.  I discussed the assessment and treatment plan with the patient. The patient was provided an opportunity to ask questions and all were answered. The patient agreed with the plan and demonstrated an understanding of the instructions.   The patient was advised to call back or seek an in-person evaluation if the symptoms worsen or if the condition fails to improve as anticipated.  I provided 20 minutes of non-face-to-face time during this encounter.  The patient was located at home.  The provider was located at [place].   Aloha Gell, NP   Subjective:   Patient ID:  Chase Lee is a 41 y.o. (DOB 1980/08/12) male.  Chief Complaint: No chief complaint on file.   HPI Chase Lee presents for follow-up of MDD, panic attacks and GAD.   Describes mood today as "ok". Pleasant. Denies tearfulness. Mood symptoms - denies depression, anxiety, and irritability. Mood is consistent. Stating "I'm doing alright - things are looking up". Feels like medications continue to work well for him. He and wife doing well. Stable interest and motivation. Taking medications as prescribed.  Energy levels stable. Active, has a regular exercise routine.  Enjoys some usual interests and activities. Married. Lives with wife. Family in [place]. Spending time with family. Appetite adequate. Weight 226.2 pounds.  Sleeps well most nights. Averages 6 to 8 hours. Focus and concentration stable. Completing tasks. Managing aspects of household. Work going well - federal position.  Denies  SI or HI.  Denies AH or VH. Denies self harm. Denies substance use.   Previous medications: Prozac, Zoloft   Review of Systems:  Review of Systems  Musculoskeletal:  Negative for gait problem.  Neurological:  Negative for tremors.  Psychiatric/Behavioral:         Please refer to HPI    Medications: I have reviewed the patient's current medications.  Current Outpatient Medications  Medication Sig Dispense Refill   albuterol (VENTOLIN HFA) 108 (90 Base) MCG/ACT inhaler WITH SPACER - 1 puff as needed for cough     ALPRAZolam (XANAX) 1 MG tablet TAKE 1 TABLET(1 MG) BY MOUTH THREE TIMES DAILY AS NEEDED FOR ANXIETY 90 tablet 2   escitalopram (LEXAPRO) 10 MG tablet TAKE 1 TABLET(10 MG) BY MOUTH DAILY 30 tablet 5   furosemide (LASIX) 40 MG tablet Take 40 mg by mouth daily.     ibuprofen (ADVIL) 800 MG tablet Take 1 tablet (800 mg total) by mouth every 8 (eight) hours as needed. 30 tablet 0   lisinopril (ZESTRIL) 20 MG tablet Take 20 mg by mouth daily.     loperamide (IMODIUM A-D) 2 MG tablet Take 1 tablet (2 mg total) by mouth 4 (four) times daily as needed for diarrhea or loose stools. 30 tablet 0   omeprazole (PRILOSEC) 40 MG capsule Take 1 capsule (40 mg total) by mouth daily. 60 capsule 0   polyethylene glycol-electrolytes (TRILYTE) 420 g solution Take 4,000 mLs by mouth as directed. 4000 mL 0   warfarin (COUMADIN) 2 MG tablet Take 2 mg by mouth See admin instructions. Take with 2.5 mg for total of  4.5 at bedtime     warfarin (COUMADIN) 2.5 MG tablet Take 2.5 mg by mouth See admin instructions. Take 2 mg with 2.5 mg at bedtime  for a total of 4.5 mg daily     No current facility-administered medications for this visit.    Medication Side Effects: None  Allergies: No Known Allergies  Past Medical History:  Diagnosis Date   Anxiety    History of artificial heart valve 2008   surgery at 28    Family History  Problem Relation Age of Onset   Depression Mother    Cancer Mother     Cancer Father     Social History   Socioeconomic History   Marital status: Married    Spouse name: Not on file   Number of children: 0   Years of education: Not on file   Highest education level: Not on file  Occupational History   Occupation: Sales    Employer: [employer]  Tobacco Use   Smoking status: Some Days    Types: Cigarettes    Last attempt to quit: 08/10/2019    Years since quitting: 2.4    Passive exposure: Never   Smokeless tobacco: Never  Vaping Use   Vaping Use: Never used  Substance and Sexual Activity   Alcohol use: Never   Drug use: Never   Sexual activity: Yes    Birth control/protection: None  Other Topics Concern   Not on file  Social History Narrative   Not on file   Social Determinants of Health   Financial Resource Strain: Not on file  Food Insecurity: Not on file  Transportation Needs: Not on file  Physical Activity: Not on file  Stress: Not on file  Social Connections: Not on file  Intimate Partner Violence: Not on file    Past Medical History, Surgical history, Social history, and Family history were reviewed and updated as appropriate.   Please see review of systems for further details on the patient's review from today.   Objective:   Physical Exam:  There were no vitals taken for this visit.  Physical Exam Constitutional:      General: He is not in acute distress. Musculoskeletal:        General: No deformity.  Neurological:     Mental Status: He is alert and oriented to person, place, and time.     Coordination: Coordination normal.  Psychiatric:        Attention and Perception: Attention and perception normal. He does not perceive auditory or visual hallucinations.        Mood and Affect: Mood normal. Mood is not anxious or depressed. Affect is not labile, blunt, angry or inappropriate.        Speech: Speech normal.        Behavior: Behavior normal.        Thought Content: Thought content normal. Thought content is not  paranoid or delusional. Thought content does not include homicidal or suicidal ideation. Thought content does not include homicidal or suicidal plan.        Cognition and Memory: Cognition and memory normal.        Judgment: Judgment normal.     Comments: Insight intact     Lab Review:     Component Value Date/Time   NA 138 06/13/2021 0553   K 3.5 06/13/2021 0553   CL 109 06/13/2021 0553   CO2 24 06/13/2021 0553   GLUCOSE 112 (H) 06/13/2021 0553   BUN 10 06/13/2021  0553   CREATININE 0.86 06/13/2021 0553   CALCIUM 7.6 (L) 06/13/2021 0553   PROT 6.2 (L) 06/13/2021 0553   ALBUMIN 3.7 06/13/2021 0553   AST 30 06/13/2021 0553   ALT 44 06/13/2021 0553   ALKPHOS 60 06/13/2021 0553   BILITOT 0.5 06/13/2021 0553   GFRNONAA >60 06/13/2021 0553   GFRAA >60 08/12/2019 0734       Component Value Date/Time   WBC 8.9 06/13/2021 0553   RBC 4.48 06/13/2021 0553   HGB 12.7 (L) 06/13/2021 0553   HCT 39.3 06/13/2021 0553   PLT 147 (L) 06/13/2021 0553   MCV 87.7 06/13/2021 0553   MCH 28.3 06/13/2021 0553   MCHC 32.3 06/13/2021 0553   RDW 13.6 06/13/2021 0553    No results found for: "POCLITH", "LITHIUM"   No results found for: "PHENYTOIN", "PHENOBARB", "VALPROATE", "CBMZ"   .res Assessment: Plan:    Plan:  Lexapro 48m at hs Xanax 127mTID  RTC 3 months  Patient advised to contact office with any questions, adverse effects, or acute worsening in signs and symptoms.  Time spent with patient was 20 minutes. Greater than 50% of face to face time with patient was spent on counseling and coordination of care.    Discussed potential benefits, risk, and side effects of benzodiazepines to include potential risk of tolerance and dependence, as well as possible drowsiness.  Advised patient not to drive if experiencing drowsiness and to take lowest possible effective dose to minimize risk of dependence and tolerance.  Diagnoses and all orders for this visit:  Generalized anxiety  disorder -     escitalopram (LEXAPRO) 10 MG tablet; TAKE 1 TABLET(10 MG) BY MOUTH DAILY -     ALPRAZolam (XANAX) 1 MG tablet; TAKE 1 TABLET(1 MG) BY MOUTH THREE TIMES DAILY AS NEEDED FOR ANXIETY  Panic attacks -     escitalopram (LEXAPRO) 10 MG tablet; TAKE 1 TABLET(10 MG) BY MOUTH DAILY -     ALPRAZolam (XANAX) 1 MG tablet; TAKE 1 TABLET(1 MG) BY MOUTH THREE TIMES DAILY AS NEEDED FOR ANXIETY  Major depressive disorder, recurrent episode, moderate (HCC) -     escitalopram (LEXAPRO) 10 MG tablet; TAKE 1 TABLET(10 MG) BY MOUTH DAILY     Please see After Visit Summary for patient specific instructions.  No future appointments.   No orders of the defined types were placed in this encounter.     -------------------------------

## 2022-01-26 ENCOUNTER — Other Ambulatory Visit: Payer: Self-pay

## 2022-01-26 ENCOUNTER — Encounter: Payer: Self-pay | Admitting: Emergency Medicine

## 2022-01-26 ENCOUNTER — Ambulatory Visit
Admission: EM | Admit: 2022-01-26 | Discharge: 2022-01-26 | Disposition: A | Discharge status: Home / Self Care | Payer: No Typology Code available for payment source | Attending: Family Medicine | Admitting: Family Medicine

## 2022-01-26 DIAGNOSIS — R52 Pain, unspecified: Secondary | ICD-10-CM

## 2022-01-26 DIAGNOSIS — R5383 Other fatigue: Secondary | ICD-10-CM | POA: Diagnosis present

## 2022-01-26 DIAGNOSIS — R519 Headache, unspecified: Secondary | ICD-10-CM

## 2022-01-26 DIAGNOSIS — Z1152 Encounter for screening for COVID-19: Secondary | ICD-10-CM | POA: Insufficient documentation

## 2022-01-26 DIAGNOSIS — R030 Elevated blood-pressure reading, without diagnosis of hypertension: Secondary | ICD-10-CM

## 2022-01-26 LAB — POCT URINALYSIS DIP (MANUAL ENTRY)
Bilirubin, UA: NEGATIVE
Glucose, UA: NEGATIVE mg/dL
Ketones, POC UA: NEGATIVE mg/dL
Leukocytes, UA: NEGATIVE
Nitrite, UA: NEGATIVE
Protein Ur, POC: NEGATIVE mg/dL
Spec Grav, UA: 1.025 (ref 1.010–1.025)
Urobilinogen, UA: 0.2 E.U./dL
pH, UA: 6 (ref 5.0–8.0)

## 2022-01-26 LAB — RESP PANEL BY RT-PCR (FLU A&B, COVID) ARPGX2
Influenza A by PCR: NEGATIVE
Influenza B by PCR: NEGATIVE
SARS Coronavirus 2 by RT PCR: NEGATIVE

## 2022-01-26 LAB — POCT FASTING CBG KUC MANUAL ENTRY: POCT Glucose (KUC): 105 mg/dL — AB (ref 70–99)

## 2022-01-26 NOTE — ED Triage Notes (Signed)
Pt reports is currently on lisinopril and reports typically BP runs low at baseline but reports yesterday at doctors appointment it was elevated. Pt reports ever since has felt "foggy". Pt reports intermittent chills as well. Pt reports feels similar when takes metformin. Last dose was this am.

## 2022-01-26 NOTE — ED Provider Notes (Signed)
[facility]    CSN: [id number] Arrival date & time: 01/26/22  1428      History   Chief Complaint Chief Complaint  Patient presents with   Headache    HPI Chase Lee is a 41 y.o. male.   Patient presenting today with 1 day history of mild headache, feeling slightly lethargic, foggy, achy and very fatigued.  Denies new vision change, nausea, vomiting, head injury, altered mental status, cough, congestion, chest pain, shortness of breath, vomiting, diarrhea.  Took an Excedrin for his headache and that resolved that and he is feeling overall better now but still fatigued and mildly achy.  He states sometimes his metformin will make him feel this way and he is wanting to possibly switch to something else for this reason.  No known sick contacts recently.  Of note, patient does have a mechanical heart valve and is on Coumadin for anticoagulation.  Last INR check was yesterday.  Also treated with Lexapro and Xanax for anxiety for which he has been stable for quite some time.   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 Authorizing Provider  albuterol (VENTOLIN HFA) 108 (90 Base) MCG/ACT inhaler WITH SPACER - 1 puff as needed for cough 05/16/21   [provider]  ALPRAZolam (XANAX) 1 MG tablet TAKE 1 TABLET(1 MG) BY MOUTH THREE TIMES  DAILY AS NEEDED FOR ANXIETY 01/25/22   Mozingo, Berdie Ogren, NP  escitalopram (LEXAPRO) 10 MG tablet TAKE 1 TABLET(10 MG) BY MOUTH DAILY 01/25/22   Mozingo, Berdie Ogren, NP  furosemide (LASIX) 40 MG tablet Take 40 mg by mouth daily. 01/04/19   [provider]  ibuprofen (ADVIL) 800 MG tablet Take 1 tablet (800 mg total) by mouth every 8 (eight) hours as needed. 06/13/21   Manuella Ghazi, Pratik D, DO  lisinopril (ZESTRIL) 20 MG tablet Take 20 mg by mouth daily. 11/06/18   [provider]  loperamide (IMODIUM A-D) 2 MG tablet Take 1 tablet (2 mg total) by mouth 4 (four) times daily as needed for diarrhea or loose stools. 06/13/21   Manuella Ghazi, Pratik D, DO  omeprazole (PRILOSEC) 40 MG capsule Take 1 capsule (40 mg total) by mouth daily. 04/28/19   Avegno, Darrelyn Hillock, FNP  polyethylene glycol-electrolytes (TRILYTE) 420 g solution Take 4,000 mLs by mouth as directed. 07/11/21   Harvel Quale, MD  warfarin (COUMADIN) 2 MG tablet Take 2 mg by mouth See admin instructions. Take with 1.5 mg for total of 3.5 at bedtime 06/01/21   [provider]  warfarin (COUMADIN) 2.5 MG tablet Take 2.5 mg by mouth See admin instructions. Take 2 mg with 2.5 mg at bedtime  for a total of 4.5 mg daily 06/17/20   [provider]   Family History Family History  Problem Relation Age of Onset   Depression Mother    Cancer Mother    Cancer Father    Social History Social History   Tobacco Use   Smoking status: Some Days    Types: Cigarettes    Last attempt to quit: 08/10/2019    Years since quitting: 2.4    Passive exposure: Never   Smokeless tobacco: Never  Vaping Use   Vaping Use: Never used  Substance Use Topics   Alcohol use: Never   Drug use: Never     Allergies   Patient has no known allergies.   Review of Systems Review of Systems PER HPI  Physical Exam Triage Vital Signs ED Triage Vitals [01/26/22 1509]  Enc Vitals Group     BP (!) 153/93     Pulse Rate 63      Resp 20     Temp 98.4 F (36.9 C)     Temp Source Oral     SpO2 96 %     Weight      Height      Head Circumference      Peak Flow      Pain Score 6     Pain Loc      Pain Edu?      Excl. in [place]?    No data found.  Updated Vital Signs BP 126/82 (BP Location: Right Arm)   Pulse 63   Temp 98.4 F (36.9 C) (Oral)   Resp 20   SpO2 96%   Visual Acuity Right Eye Distance:   Left Eye Distance:   Bilateral Distance:    Right Eye Near:   Left Eye Near:    Bilateral Near:     Physical Exam Vitals and nursing note reviewed.  Constitutional:      Appearance: Normal appearance.  HENT:     Head: Atraumatic.     Mouth/Throat:     Mouth: Mucous membranes are moist.  Eyes:     Extraocular Movements: Extraocular movements intact.     Conjunctiva/sclera: Conjunctivae normal.     Pupils: Pupils are equal, round, and reactive to light.  Cardiovascular:     Rate and Rhythm: Normal rate and regular rhythm.  Pulmonary:     Effort: Pulmonary effort is normal.     Breath sounds: Normal breath sounds. No wheezing or rales.  Musculoskeletal:        General: Normal range of motion.     Cervical back: Normal range of motion and neck supple.  Skin:    General: Skin is warm and dry.  Neurological:     General: No focal deficit present.     Mental Status: He is oriented to person, place, and time.     Cranial Nerves: No cranial nerve deficit.     Motor: No weakness.     Gait: Gait normal.  Psychiatric:        Mood and Affect: Mood normal.        Thought Content: Thought content normal.        Judgment: Judgment normal.    UC Treatments / Results  Labs (all labs ordered are listed, but only abnormal results are displayed) Labs Reviewed  POCT FASTING CBG [facility] MANUAL ENTRY - Abnormal; Notable for the following components:      Result Value   POCT Glucose ([facility]) 105 (*)    All other components within normal limits  POCT URINALYSIS DIP (MANUAL ENTRY) - Abnormal; Notable for the  following  components:   Blood, UA trace-intact (*)    All other components within normal limits  RESP PANEL BY RT-PCR (FLU A&B, COVID) [id number]    EKG  Radiology No results found.  Procedures Procedures (including critical care time)  Medications Ordered in UC Medications - No data to display  Initial Impression / Assessment and Plan / UC Course  I have reviewed the triage vital signs and the nursing notes.  Pertinent labs & imaging results that were available during my care of the patient were reviewed by me and considered in my medical decision making (see chart for details).     Vital signs and exam completely benign and reassuring today with no focal abnormalities.  Urinalysis was reassuring and point-of-care glucose was within normal limits.  He declines lab work as he just had labs with his PCP about a month ago and declines EKG today for further evaluation.  His blood pressure was high upon arrival but resolved upon recheck.  He states he is feeling overall much better but given his fatigue and achiness will check for COVID and flu.  Discussed good hydration, over-the-counter supportive medications and home care.  Regarding his metformin, discussed to speak with his primary care provider regarding the potential side effects he is experiencing and ask if there are any other options.  45 minutes spent today in direct patient care, evaluation and education.  Final Clinical Impressions(s) / UC Diagnoses   Final diagnoses:  Other fatigue  Generalized body aches  Acute nonintractable headache, unspecified headache type  Elevated blood pressure reading   Discharge Instructions   None    ED Prescriptions   None    PDMP not reviewed this encounter.   Volney American, [place] 01/26/22 1631

## 2022-02-15 ENCOUNTER — Ambulatory Visit: Payer: No Typology Code available for payment source | Admitting: Orthopaedic Surgery

## 2022-02-24 ENCOUNTER — Encounter: Payer: Self-pay | Admitting: Family

## 2022-02-24 ENCOUNTER — Ambulatory Visit (INDEPENDENT_AMBULATORY_CARE_PROVIDER_SITE_OTHER): Payer: No Typology Code available for payment source

## 2022-02-24 ENCOUNTER — Ambulatory Visit (INDEPENDENT_AMBULATORY_CARE_PROVIDER_SITE_OTHER): Payer: No Typology Code available for payment source | Admitting: Family

## 2022-02-24 DIAGNOSIS — M5416 Radiculopathy, lumbar region: Secondary | ICD-10-CM

## 2022-02-24 MED ORDER — PREDNISONE 50 MG PO TABS: ORAL_TABLET | ORAL | 0 refills | Status: DC

## 2022-02-24 NOTE — Progress Notes (Signed)
Office Visit Note   Patient: Chase Lee           Date of Birth: 06/11/1980           MRN: [id number] Visit Date: 02/24/2022              Requested by: Leonie Douglas, MD [address] [place],   [id number] PCP: Leonie Douglas, MD  Chief Complaint  Patient presents with   Right Knee - Pain      HPI: The patient is a 41 year old gentleman who is seen today for initial evaluation of right knee pain.  He does have a history of osteoarthritis he states this has been gradually worsening over the last months.  He has lateral knee pain that shoots into his lateral calf this is associated with stabbing and burning pain.  He also has some extension of the symptoms into his posterior thigh he complains of heaviness with ambulation difficulty with weightbearing due to pain.  No red flag symptoms.  Assessment & Plan: Visit Diagnoses:  1. Lumbar radiculopathy     Plan: Lumbar radiculopathy on the right.  L4 over L5 spondylolisthesis.  With some loss of disc height as well.  Will place on a prednisone course refer to Dr. Ernestina Patches for epidural steroid injection.  Follow-Up Instructions: No follow-ups on file.   Right Knee Exam   Tenderness  The patient is experiencing no tenderness.   Range of Motion  The patient has normal right knee ROM.  Tests  Varus: negative Valgus: negative  Other  Effusion: no effusion present   Back Exam   Tenderness  The patient is experiencing no tenderness.   Range of Motion  The patient has normal back ROM.  Muscle Strength  Right Hamstrings:  3/5  Left Hamstrings:  5/5   Tests  Straight leg raise right: positive  Other  Gait: normal       Patient is alert, oriented, no adenopathy, well-dressed, normal affect, normal respiratory effort.   Imaging: No results found. No images are attached to the encounter.  Labs: No results found for: "HGBA1C", "ESRSEDRATE", "CRP", "LABURIC", "REPTSTATUS", "GRAMSTAIN", "CULT",  "LABORGA"   Lab Results  Component Value Date   ALBUMIN 3.7 06/13/2021   ALBUMIN 3.8 06/12/2021   ALBUMIN 5.1 (H) 06/11/2021    Lab Results  Component Value Date   MG 2.1 06/13/2021   No results found for: "VD25OH"  No results found for: "PREALBUMIN"    Latest Ref Rng & Units 06/13/2021    5:53 AM 06/12/2021    4:32 AM 06/11/2021    4:55 AM  CBC EXTENDED  WBC 4.0 - 10.5 K/uL 8.9  9.4  7.8   RBC 4.22 - 5.81 MIL/uL 4.48  4.80  5.54   Hemoglobin 13.0 - 17.0 g/dL 12.7  13.6  16.3   HCT 39.0 - 52.0 % 39.3  41.6  48.3   Platelets 150 - 400 K/uL 147  145  186      There is no height or weight on file to calculate BMI.  Orders:  Orders Placed This Encounter  Procedures   XR Lumbar Spine 2-3 Views   No orders of the defined types were placed in this encounter.    Procedures: No procedures performed  Clinical Data: No additional findings.  ROS:  All other systems negative, except as noted in the HPI. Review of Systems  Objective: Vital Signs: There were no vitals taken for this visit.  Specialty Comments:  No specialty comments available.  PMFS History: Patient Active Problem List   Diagnosis Date Noted   Right sided abdominal pain    Colitis 06/11/2021   H/o Opiate Dependence/ On Suboxone maintenance therapy 06/11/2021   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 06/11/2021   Disorder of hepatic portal vein/Small amount of portal venous gas in the left lobe of the liver 06/11/2021   Status post mechanical aortic valve replacement at Age 9 06/11/2021   Past Medical History:  Diagnosis Date   Anxiety    History of artificial heart valve 2008   surgery at 74    Family History  Problem Relation Age of Onset   Depression Mother    Cancer Mother    Cancer Father     Past Surgical History:  Procedure Laterality Date   CARDIAC SURGERY     Social History   Occupational History   Occupation: Scientist, clinical (histocompatibility and immunogenetics): [employer]  Tobacco Use   Smoking status: Some Days     Types: Cigarettes    Last attempt to quit: 08/10/2019    Years since quitting: 2.5    Passive exposure: Never   Smokeless tobacco: Never  Vaping Use   Vaping Use: Never used  Substance and Sexual Activity   Alcohol use: Never   Drug use: Never   Sexual activity: Yes    Birth control/protection: None

## 2022-04-03 ENCOUNTER — Ambulatory Visit (HOSPITAL_COMMUNITY): Payer: No Typology Code available for payment source

## 2022-04-03 ENCOUNTER — Encounter (HOSPITAL_COMMUNITY): Payer: Self-pay

## 2022-04-05 ENCOUNTER — Telehealth: Payer: Self-pay | Admitting: Physical Medicine and Rehabilitation

## 2022-04-05 NOTE — Telephone Encounter (Signed)
Patient called in stating his insurance denied the MRI at [facility] because he needed to get it at a private office, patient stated he was going to call his insurance company to see which office he can go to that is in network and he is going to call us when he gets the okay from insurance so we can send the order over to that location. Please cancel tomorrows appt being he has not had MRI  yet

## 2022-04-05 NOTE — Telephone Encounter (Signed)
Appt cancelled

## 2022-04-06 ENCOUNTER — Ambulatory Visit: Payer: No Typology Code available for payment source | Admitting: Physical Medicine and Rehabilitation

## 2022-05-05 ENCOUNTER — Ambulatory Visit (INDEPENDENT_AMBULATORY_CARE_PROVIDER_SITE_OTHER): Payer: No Typology Code available for payment source | Admitting: Adult Health

## 2022-05-05 ENCOUNTER — Encounter: Payer: Self-pay | Admitting: Adult Health

## 2022-05-05 DIAGNOSIS — F331 Major depressive disorder, recurrent, moderate: Secondary | ICD-10-CM | POA: Diagnosis not present

## 2022-05-05 DIAGNOSIS — F411 Generalized anxiety disorder: Secondary | ICD-10-CM | POA: Diagnosis not present

## 2022-05-05 DIAGNOSIS — F410 Panic disorder [episodic paroxysmal anxiety] without agoraphobia: Secondary | ICD-10-CM | POA: Diagnosis not present

## 2022-05-05 MED ORDER — ALPRAZOLAM 1 MG PO TABS: ORAL_TABLET | ORAL | 2 refills | Status: DC

## 2022-05-05 MED ORDER — ESCITALOPRAM OXALATE 10 MG PO TABS: ORAL_TABLET | ORAL | 5 refills | Status: DC

## 2022-05-05 NOTE — Progress Notes (Signed)
Revis Botz [id number] [birth year] 42 y.o.  Subjective:   Patient ID:  Chase Lee is a 42 y.o. (DOB 08/15/1980) male.  Chief Complaint: No chief complaint on file.   HPI Chase Lee presents to the office today for follow-up of MDD, panic attacks and GAD.   Describes mood today as "ok". Pleasant. Denies tearfulness. Mood symptoms - denies depression, anxiety, and irritability. Mood is consistent. Stating "I'm doing pretty good". Feels like medications continue to work well for him. He and wife doing well. Stable interest and motivation. Taking medications as prescribed.  Energy levels stable. Active, has a regular exercise routine.  Enjoys some usual interests and activities. Married. Lives with wife. Family in [place]. Spending time with family. Appetite adequate. Weight 226.2 pounds.  Sleeps well most nights. Averages 6 to 8 hours on days off and 5 hours on work days. Focus and concentration stable. Completing tasks. Managing aspects of household. Work going well - federal position.  Denies SI or HI.  Denies AH or VH. Denies self harm. Denies substance use.   Previous medications: Prozac, Zoloft   Flowsheet Row ED from 01/26/2022 in [facility] ED from 06/29/2021 in [facility] ED to Hosp-Admission (Discharged) from 06/11/2021 in [place] No Risk No Risk No Risk        Review of Systems:  Review of Systems  Musculoskeletal:  Negative for gait problem.  Neurological:  Negative for tremors.  Psychiatric/Behavioral:         Please refer to HPI    Medications: I have reviewed the patient's current medications.  Current Outpatient Medications  Medication Sig Dispense Refill   albuterol (VENTOLIN HFA) 108 (90 Base) MCG/ACT inhaler WITH SPACER - 1 puff as needed for cough     ALPRAZolam (XANAX) 1 MG tablet TAKE 1 TABLET(1 MG) BY MOUTH THREE TIMES DAILY AS NEEDED FOR ANXIETY 90 tablet 2    escitalopram (LEXAPRO) 10 MG tablet TAKE 1 TABLET(10 MG) BY MOUTH DAILY 30 tablet 5   furosemide (LASIX) 40 MG tablet Take 40 mg by mouth daily.     ibuprofen (ADVIL) 800 MG tablet Take 1 tablet (800 mg total) by mouth every 8 (eight) hours as needed. 30 tablet 0   lisinopril (ZESTRIL) 20 MG tablet Take 20 mg by mouth daily.     loperamide (IMODIUM A-D) 2 MG tablet Take 1 tablet (2 mg total) by mouth 4 (four) times daily as needed for diarrhea or loose stools. 30 tablet 0   omeprazole (PRILOSEC) 40 MG capsule Take 1 capsule (40 mg total) by mouth daily. 60 capsule 0   polyethylene glycol-electrolytes (TRILYTE) 420 g solution Take 4,000 mLs by mouth as directed. 4000 mL 0   predniSONE (DELTASONE) 50 MG tablet Take one tablet by mouth once daily for 5 days. 5 tablet 0   warfarin (COUMADIN) 2 MG tablet Take 2 mg by mouth See admin instructions. Take with 1.5 mg for total of 3.5 at bedtime     warfarin (COUMADIN) 2.5 MG tablet Take 2.5 mg by mouth See admin instructions. Take 2 mg with 2.5 mg at bedtime  for a total of 4.5 mg daily     No current facility-administered medications for this visit.    Medication Side Effects: None  Allergies: No Known Allergies  Past Medical History:  Diagnosis Date   Anxiety    History of artificial heart valve 2008   surgery at  8    Past Medical History, Surgical history, Social history, and Family history were reviewed and updated as appropriate.   Please see review of systems for further details on the patient's review from today.   Objective:   Physical Exam:  There were no vitals taken for this visit.  Physical Exam Constitutional:      General: He is not in acute distress. Musculoskeletal:        General: No deformity.  Neurological:     Mental Status: He is alert and oriented to person, place, and time.     Coordination: Coordination normal.  Psychiatric:        Attention and Perception: Attention and perception normal. He does not  perceive auditory or visual hallucinations.        Mood and Affect: Mood normal. Mood is not anxious or depressed. Affect is not labile, blunt, angry or inappropriate.        Speech: Speech normal.        Behavior: Behavior normal.        Thought Content: Thought content normal. Thought content is not paranoid or delusional. Thought content does not include homicidal or suicidal ideation. Thought content does not include homicidal or suicidal plan.        Cognition and Memory: Cognition and memory normal.        Judgment: Judgment normal.     Comments: Insight intact     Lab Review:     Component Value Date/Time   NA 138 06/13/2021 0553   K 3.5 06/13/2021 0553   CL 109 06/13/2021 0553   CO2 24 06/13/2021 0553   GLUCOSE 112 (H) 06/13/2021 0553   BUN 10 06/13/2021 0553   CREATININE 0.86 06/13/2021 0553   CALCIUM 7.6 (L) 06/13/2021 0553   PROT 6.2 (L) 06/13/2021 0553   ALBUMIN 3.7 06/13/2021 0553   AST 30 06/13/2021 0553   ALT 44 06/13/2021 0553   ALKPHOS 60 06/13/2021 0553   BILITOT 0.5 06/13/2021 0553   GFRNONAA >60 06/13/2021 0553   GFRAA >60 08/12/2019 0734       Component Value Date/Time   WBC 8.9 06/13/2021 0553   RBC 4.48 06/13/2021 0553   HGB 12.7 (L) 06/13/2021 0553   HCT 39.3 06/13/2021 0553   PLT 147 (L) 06/13/2021 0553   MCV 87.7 06/13/2021 0553   MCH 28.3 06/13/2021 0553   MCHC 32.3 06/13/2021 0553   RDW 13.6 06/13/2021 0553    No results found for: "POCLITH", "LITHIUM"   No results found for: "PHENYTOIN", "PHENOBARB", "VALPROATE", "CBMZ"   .res Assessment: Plan:    Plan:  Lexapro 38m at hs Xanax 162mTID  RTC 3 months  Patient advised to contact office with any questions, adverse effects, or acute worsening in signs and symptoms.  Time spent with patient was 20 minutes. Greater than 50% of face to face time with patient was spent on counseling and coordination of care.    Discussed potential benefits, risk, and side effects of benzodiazepines  to include potential risk of tolerance and dependence, as well as possible drowsiness.  Advised patient not to drive if experiencing drowsiness and to take lowest possible effective dose to minimize risk of dependence and tolerance.  Diagnoses and all orders for this visit:  Major depressive disorder, recurrent episode, moderate (HCC) -     escitalopram (LEXAPRO) 10 MG tablet; TAKE 1 TABLET(10 MG) BY MOUTH DAILY  Generalized anxiety disorder -     escitalopram (LEXAPRO) 10 MG tablet;  TAKE 1 TABLET(10 MG) BY MOUTH DAILY -     ALPRAZolam (XANAX) 1 MG tablet; TAKE 1 TABLET(1 MG) BY MOUTH THREE TIMES DAILY AS NEEDED FOR ANXIETY  Panic attacks -     escitalopram (LEXAPRO) 10 MG tablet; TAKE 1 TABLET(10 MG) BY MOUTH DAILY -     ALPRAZolam (XANAX) 1 MG tablet; TAKE 1 TABLET(1 MG) BY MOUTH THREE TIMES DAILY AS NEEDED FOR ANXIETY     Please see After Visit Summary for patient specific instructions.  No future appointments.   No orders of the defined types were placed in this encounter.   -------------------------------

## 2022-05-18 ENCOUNTER — Encounter: Payer: Self-pay | Admitting: Radiology

## 2022-07-03 ENCOUNTER — Ambulatory Visit: Payer: No Typology Code available for payment source | Admitting: Internal Medicine

## 2022-07-26 ENCOUNTER — Ambulatory Visit: Payer: No Typology Code available for payment source | Admitting: Internal Medicine

## 2022-08-04 ENCOUNTER — Ambulatory Visit (INDEPENDENT_AMBULATORY_CARE_PROVIDER_SITE_OTHER): Payer: Self-pay | Admitting: Adult Health

## 2022-08-15 ENCOUNTER — Encounter: Payer: Self-pay | Admitting: Adult Health

## 2022-08-15 ENCOUNTER — Ambulatory Visit (INDEPENDENT_AMBULATORY_CARE_PROVIDER_SITE_OTHER): Payer: No Typology Code available for payment source | Admitting: Adult Health

## 2022-08-15 DIAGNOSIS — F331 Major depressive disorder, recurrent, moderate: Secondary | ICD-10-CM

## 2022-08-15 DIAGNOSIS — F410 Panic disorder [episodic paroxysmal anxiety] without agoraphobia: Secondary | ICD-10-CM

## 2022-08-15 DIAGNOSIS — F411 Generalized anxiety disorder: Secondary | ICD-10-CM

## 2022-08-15 MED ORDER — ALPRAZOLAM 1 MG PO TABS: ORAL_TABLET | ORAL | 2 refills | Status: DC

## 2022-08-15 MED ORDER — ESCITALOPRAM OXALATE 10 MG PO TABS: ORAL_TABLET | ORAL | 5 refills | Status: DC

## 2022-08-15 NOTE — Progress Notes (Signed)
Chase Lee [id number] Feb 07, 1981 42 y.o.  Subjective:   Patient ID:  Chase Lee is a 42 y.o. (DOB 01/11/1981) male.  Chief Complaint: No chief complaint on file.   HPI Chase Lee presents to the office today for follow-up of MDD, panic attacks and GAD.   Describes mood today as "ok". Pleasant. Denies tearfulness. Mood symptoms - denies depression, anxiety, and irritability. Mood is stable. Stating "I'm doing pretty good". Feels like medications continue to work well for him. He and wife doing well. Stable interest and motivation. Taking medications as prescribed.  Energy levels stable. Active, has a regular exercise routine - walking at work 3 to 3.5 miles.  Enjoys some usual interests and activities. Married. Lives with wife. Family in [place]. Spending time with family. Appetite adequate. Weight 226 pounds.  Sleeps well most nights. Averages 6 to 8 hours on days off and 5 hours on work days. Focus and concentration stable. Completing tasks. Managing aspects of household. Work going well - federal position.  Denies SI or HI.  Denies AH or VH. Denies self harm. Denies substance use.   Previous medications: Prozac, Zoloft   Flowsheet Row ED from 01/26/2022 in [facility] ED from 06/29/2021 in [facility] ED to Hosp-Admission (Discharged) from 06/11/2021 in [facility] MEDICAL SURGICAL UNIT  C-SSRS RISK CATEGORY No Risk No Risk No Risk        Review of Systems:  Review of Systems  Musculoskeletal:  Negative for gait problem.  Neurological:  Negative for tremors.  Psychiatric/Behavioral:         Please refer to HPI    Medications: I have reviewed the patient's current medications.  Current Outpatient Medications  Medication Sig Dispense Refill   albuterol (VENTOLIN HFA) 108 (90 Base) MCG/ACT inhaler WITH SPACER - 1 puff as needed for cough     ALPRAZolam (XANAX) 1 MG tablet TAKE 1 TABLET(1 MG) BY MOUTH THREE TIMES DAILY AS  NEEDED FOR ANXIETY 90 tablet 2   escitalopram (LEXAPRO) 10 MG tablet TAKE 1 TABLET(10 MG) BY MOUTH DAILY 30 tablet 5   furosemide (LASIX) 40 MG tablet Take 40 mg by mouth daily.     ibuprofen (ADVIL) 800 MG tablet Take 1 tablet (800 mg total) by mouth every 8 (eight) hours as needed. 30 tablet 0   lisinopril (ZESTRIL) 20 MG tablet Take 20 mg by mouth daily.     loperamide (IMODIUM A-D) 2 MG tablet Take 1 tablet (2 mg total) by mouth 4 (four) times daily as needed for diarrhea or loose stools. 30 tablet 0   omeprazole (PRILOSEC) 40 MG capsule Take 1 capsule (40 mg total) by mouth daily. 60 capsule 0   polyethylene glycol-electrolytes (TRILYTE) 420 g solution Take 4,000 mLs by mouth as directed. 4000 mL 0   predniSONE (DELTASONE) 50 MG tablet Take one tablet by mouth once daily for 5 days. 5 tablet 0   warfarin (COUMADIN) 2 MG tablet Take 2 mg by mouth See admin instructions. Take with 1.5 mg for total of 3.5 at bedtime     warfarin (COUMADIN) 2.5 MG tablet Take 2.5 mg by mouth See admin instructions. Take 2 mg with 2.5 mg at bedtime  for a total of 4.5 mg daily     No current facility-administered medications for this visit.    Medication Side Effects: None  Allergies: No Known Allergies  Past Medical History:  Diagnosis Date   Anxiety    History of  artificial heart valve 2008   surgery at 25    Past Medical History, Surgical history, Social history, and Family history were reviewed and updated as appropriate.   Please see review of systems for further details on the patient's review from today.   Objective:   Physical Exam:  There were no vitals taken for this visit.  Physical Exam Constitutional:      General: He is not in acute distress. Musculoskeletal:        General: No deformity.  Neurological:     Mental Status: He is alert and oriented to person, place, and time.     Coordination: Coordination normal.  Psychiatric:        Attention and Perception: Attention and  perception normal. He does not perceive auditory or visual hallucinations.        Mood and Affect: Mood normal. Mood is not anxious or depressed. Affect is not labile, blunt, angry or inappropriate.        Speech: Speech normal.        Behavior: Behavior normal.        Thought Content: Thought content normal. Thought content is not paranoid or delusional. Thought content does not include homicidal or suicidal ideation. Thought content does not include homicidal or suicidal plan.        Cognition and Memory: Cognition and memory normal.        Judgment: Judgment normal.     Comments: Insight intact     Lab Review:     Component Value Date/Time   NA 138 06/13/2021 0553   K 3.5 06/13/2021 0553   CL 109 06/13/2021 0553   CO2 24 06/13/2021 0553   GLUCOSE 112 (H) 06/13/2021 0553   BUN 10 06/13/2021 0553   CREATININE 0.86 06/13/2021 0553   CALCIUM 7.6 (L) 06/13/2021 0553   PROT 6.2 (L) 06/13/2021 0553   ALBUMIN 3.7 06/13/2021 0553   AST 30 06/13/2021 0553   ALT 44 06/13/2021 0553   ALKPHOS 60 06/13/2021 0553   BILITOT 0.5 06/13/2021 0553   GFRNONAA >60 06/13/2021 0553   GFRAA >60 08/12/2019 0734       Component Value Date/Time   WBC 8.9 06/13/2021 0553   RBC 4.48 06/13/2021 0553   HGB 12.7 (L) 06/13/2021 0553   HCT 39.3 06/13/2021 0553   PLT 147 (L) 06/13/2021 0553   MCV 87.7 06/13/2021 0553   MCH 28.3 06/13/2021 0553   MCHC 32.3 06/13/2021 0553   RDW 13.6 06/13/2021 0553    No results found for: "POCLITH", "LITHIUM"   No results found for: "PHENYTOIN", "PHENOBARB", "VALPROATE", "CBMZ"   .res Assessment: Plan:    Plan:  Lexapro 10mg  at hs Xanax 1mg  TID  RTC 3 months  Patient advised to contact office with any questions, adverse effects, or acute worsening in signs and symptoms.  Time spent with patient was 20 minutes. Greater than 50% of face to face time with patient was spent on counseling and coordination of care.    Discussed potential benefits, risk, and  side effects of benzodiazepines to include potential risk of tolerance and dependence, as well as possible drowsiness.  Advised patient not to drive if experiencing drowsiness and to take lowest possible effective dose to minimize risk of dependence and tolerance.  Diagnoses and all orders for this visit:  Major depressive disorder, recurrent episode, moderate (HCC)  Generalized anxiety disorder  Panic attacks     Please see After Visit Summary for patient specific instructions.  Future Appointments  Date Time Provider Department Center  08/17/2022  2:40 PM Mallipeddi, Orion Modest, MD [facility]    No orders of the defined types were placed in this encounter.   -------------------------------

## 2022-08-17 ENCOUNTER — Ambulatory Visit: Payer: No Typology Code available for payment source | Attending: Internal Medicine | Admitting: Internal Medicine

## 2022-08-17 ENCOUNTER — Encounter: Payer: Self-pay | Admitting: Internal Medicine

## 2022-08-17 ENCOUNTER — Other Ambulatory Visit: Payer: Self-pay | Admitting: Internal Medicine

## 2022-08-17 ENCOUNTER — Ambulatory Visit: Payer: No Typology Code available for payment source | Attending: Internal Medicine

## 2022-08-17 VITALS — BP 122/78 | HR 59 | Ht 67.0 in | Wt 231.8 lb

## 2022-08-17 DIAGNOSIS — Z952 Presence of prosthetic heart valve: Secondary | ICD-10-CM

## 2022-08-17 DIAGNOSIS — Z136 Encounter for screening for cardiovascular disorders: Secondary | ICD-10-CM

## 2022-08-17 DIAGNOSIS — R002 Palpitations: Secondary | ICD-10-CM | POA: Diagnosis not present

## 2022-08-17 NOTE — Patient Instructions (Addendum)
Medication Instructions:  Your physician recommends that you continue on your current medications as directed. Please refer to the Current Medication list given to you today.   Labwork: None  Testing/Procedures: Your physician has requested that you have an echocardiogram. Echocardiography is a painless test that uses sound waves to create images of your heart. It provides your doctor with information about the size and shape of your heart and how well your heart's chambers and valves are working. This procedure takes approximately one hour. There are no restrictions for this procedure. Please do NOT wear cologne, perfume, aftershave, or lotions (deodorant is allowed). Please arrive 15 minutes prior to your appointment time.  Your physician has recommended that you wear a Zio monitor.   This monitor is a medical device that records the heart's electrical activity. Doctors most often use these monitors to diagnose arrhythmias. Arrhythmias are problems with the speed or rhythm of the heartbeat. The monitor is a small device applied to your chest. You can wear one while you do your normal daily activities. While wearing this monitor if you have any symptoms to push the button and record what you felt. Once you have worn this monitor for the period of time provider prescribed (for 14 days), you will return the monitor device in the postage paid box. Once it is returned they will download the data collected and provide [place] with a report which the provider will then review and we will call you with those results. Important tips:  Avoid showering during the first 48 hours of wearing the monitor. Avoid excessive sweating to help maximize wear time. Do not submerge the device, no hot tubs, and no swimming pools. Keep any lotions or oils away from the patch. After 48 hours you may shower with the patch on. Take brief showers with your back facing the shower head.  Do not remove patch once it has been placed  because that will interrupt data and decrease adhesive wear time. Push the button when you have any symptoms and write down what you were feeling. Once you have completed wearing your monitor, remove and place into box which has postage paid and place in your outgoing mailbox.  If for some reason you have misplaced your box then call our office and we can provide another box and/or mail it off for you.   Follow-Up: Your physician recommends that you schedule a follow-up appointment in: 6 month  Any Other Special Instructions Will Be Listed Below (If Applicable).  You have been referred to Warfarin Clinic    If you need a refill on your cardiac medications before your next appointment, please call your pharmacy.

## 2022-08-17 NOTE — Progress Notes (Signed)
Cardiology Office Note  Date: 08/17/2022   ID: Chase Lee, DOB 01-Dec-1980, MRN [id number]  PCP:  Chase Reining, MD  Cardiologist:  Chase Bicker, MD Electrophysiologist:  None   Reason for Office Visit: Test which care with cardiology due to presence of mechanical aortic valve   History of Present Illness: Chase Lee is a 42 y.o. male known to have bicuspid aortic valve s/p aortic valvuloplasty at 5 years, s/p mechanical aortic valve replacement at 42 years old was referred to cardiology clinic to establish care with cardiology.  Patient is born and raised in [place] where he underwent aortic valvuloplasty when he was 42 years old and later underwent mechanical aortic valve replacement when he was 42 years old.  He has been having symptoms of some SOB, bilateral lower EXTR swelling minimal and also worsening palpitations recently.  No angina.  No dizziness or syncope.  Past Medical History:  Diagnosis Date   Anxiety    History of artificial heart valve 2008   surgery at 23    Past Surgical History:  Procedure Laterality Date   CARDIAC SURGERY      Current Outpatient Medications  Medication Sig Dispense Refill   ALPRAZolam (XANAX) 1 MG tablet TAKE 1 TABLET(1 MG) BY MOUTH THREE TIMES DAILY AS NEEDED FOR ANXIETY 90 tablet 2   escitalopram (LEXAPRO) 10 MG tablet TAKE 1 TABLET(10 MG) BY MOUTH DAILY 30 tablet 5   lisinopril (ZESTRIL) 20 MG tablet Take 20 mg by mouth daily.     omeprazole (PRILOSEC) 40 MG capsule Take 1 capsule (40 mg total) by mouth daily. 60 capsule 0   warfarin (COUMADIN) 2 MG tablet Take 2 mg by mouth See admin instructions. Take with 1.5 mg for total of 3.5 at bedtime     warfarin (COUMADIN) 2.5 MG tablet Take 2.5 mg by mouth See admin instructions. Take 2 mg with 2.5 mg at bedtime  for a total of 4.5 mg daily     furosemide (LASIX) 40 MG tablet Take 40 mg by mouth daily. (Patient not taking: Reported on 08/17/2022)     No current  facility-administered medications for this visit.   Allergies:  Patient has no known allergies.   Social History: The patient  reports that he has been smoking cigarettes. He has never been exposed to tobacco smoke. He has never used smokeless tobacco. He reports that he does not drink alcohol and does not use drugs.   Family History: The patient's family history includes Cancer in his father and mother; Depression in his mother.   ROS:  Please see the history of present illness. Otherwise, complete review of systems is positive for none  All other systems are reviewed and negative.   Physical Exam: VS:  BP 122/78   Pulse (!) 59   Ht 5\' 7"  (1.702 m)   Wt 231 lb 12.8 oz (105.1 kg)   SpO2 94%   BMI 36.31 kg/m , BMI Body mass index is 36.31 kg/m.  Wt Readings from Last 3 Encounters:  08/17/22 231 lb 12.8 oz (105.1 kg)  07/11/21 232 lb 11.2 oz (105.6 kg)  06/11/21 231 lb 7.7 oz (105 kg)    General: Patient appears comfortable at rest. HEENT: Conjunctiva and lids normal, oropharynx clear with moist mucosa. Neck: Supple, no elevated JVP or carotid bruits, no thyromegaly. Lungs: Clear to auscultation, nonlabored breathing at rest. Cardiac: Regular rate and rhythm, no S3 or significant systolic murmur, no pericardial rub.  Mechanical click present.  Abdomen: Soft, nontender, no hepatomegaly, bowel sounds present, no guarding or rebound. Extremities: No pitting edema, distal pulses 2+. Skin: Warm and dry. Musculoskeletal: No kyphosis. Neuropsychiatric: Alert and oriented x3, affect grossly appropriate.  Recent Labwork: No results found for requested labs within last 365 days.  No results found for: "CHOL", "TRIG", "HDL", "CHOLHDL", "VLDL", "LDLCALC", "LDLDIRECT"   Assessment and Plan: Patient is a 42 year old M known to have bicuspid aortic valve s/p aortic valvuloplasty at 5 years, s/p mechanical aortic valve replacement at 42 years old was referred to cardiology clinic to establish  care with cardiology.  # Bicuspid aortic valve s/p aortic valvuloplasty at 5 years, s/p mechanical aortic valve replacement at 25 years at [place] -Continue Coumadin current dose, goal INR between 2 and 3 (target 2.5).  Referral to Coumadin clinic placed. -Update 2D echocardiogram -Obtain medical records from prior cardiologist in [place] -Dental cleanings twice per year/per dentist -SBE prophylaxis prior to dental procedures  # Palpitations -Obtain 2-week event monitor, non-live  I have spent a total of 45 minutes with patient reviewing chart, EKGs, labs and examining patient as well as establishing an assessment and plan that was discussed with the patient.  > 50% of time was spent in direct patient care.    Medication Adjustments/Labs and Tests Ordered: Current medicines are reviewed at length with the patient today.  Concerns regarding medicines are outlined above.   Tests Ordered: Orders Placed This Encounter  Procedures   Ambulatory referral to Anticoagulation Monitoring   EKG 12-Lead   ECHOCARDIOGRAM COMPLETE    Medication Changes: No orders of the defined types were placed in this encounter.   Disposition:  Follow up  6 months  Signed Chase Regnier Verne Spurr, MD, 08/17/2022 5:07 PM    [facility] at [facility] [address] [place], [place], [place] [address]

## 2022-08-22 ENCOUNTER — Ambulatory Visit: Payer: No Typology Code available for payment source | Attending: Cardiology | Admitting: *Deleted

## 2022-08-22 DIAGNOSIS — Z5181 Encounter for therapeutic drug level monitoring: Secondary | ICD-10-CM | POA: Diagnosis not present

## 2022-08-22 DIAGNOSIS — Z952 Presence of prosthetic heart valve: Secondary | ICD-10-CM

## 2022-08-22 LAB — POCT INR: INR: 2.4 (ref 2.0–3.0)

## 2022-08-22 NOTE — Patient Instructions (Signed)
Has been taking warfarin 3.5mg  daily x 5-6 months Last INR was 2.2 at the [facility] 3 weeks ago. Continue warfarin 3.5mg  daily Recheck in 3 wks

## 2022-08-30 IMAGING — DX DG KNEE COMPLETE 4+V*R*
4 series · 4 of 4 positions shown · non-contrast
Comparison: None.

CLINICAL DATA: Right knee pain for 1 week. Felt [REDACTED]s [REDACTED].

EXAM:
RIGHT KNEE - COMPLETE 4+ VIEW

[knee ap]
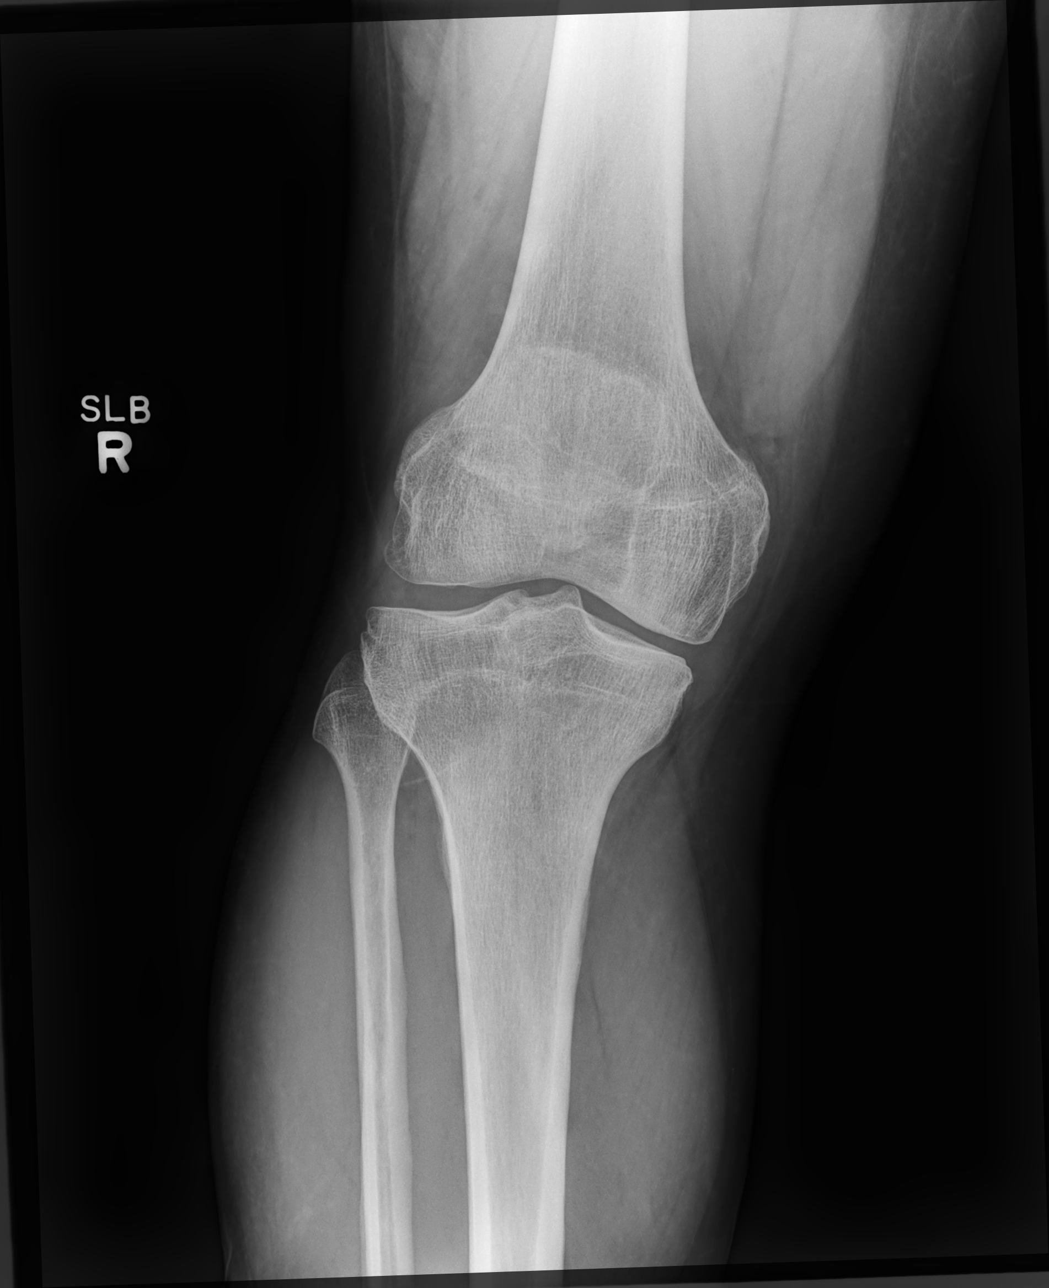

[knee mlo (1 of 2)]
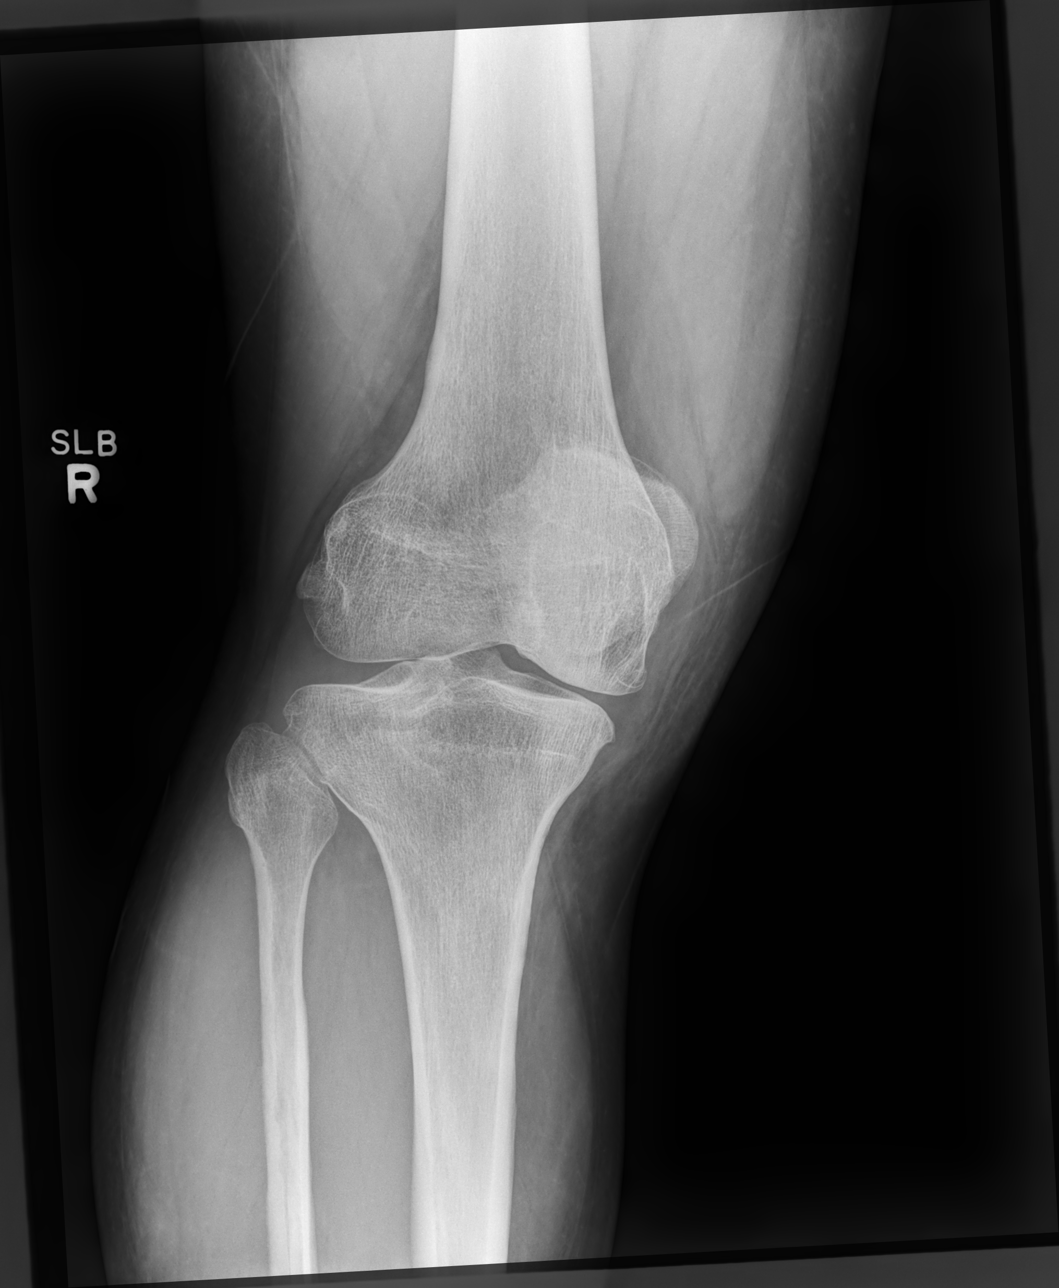

[knee mlo (2 of 2)]
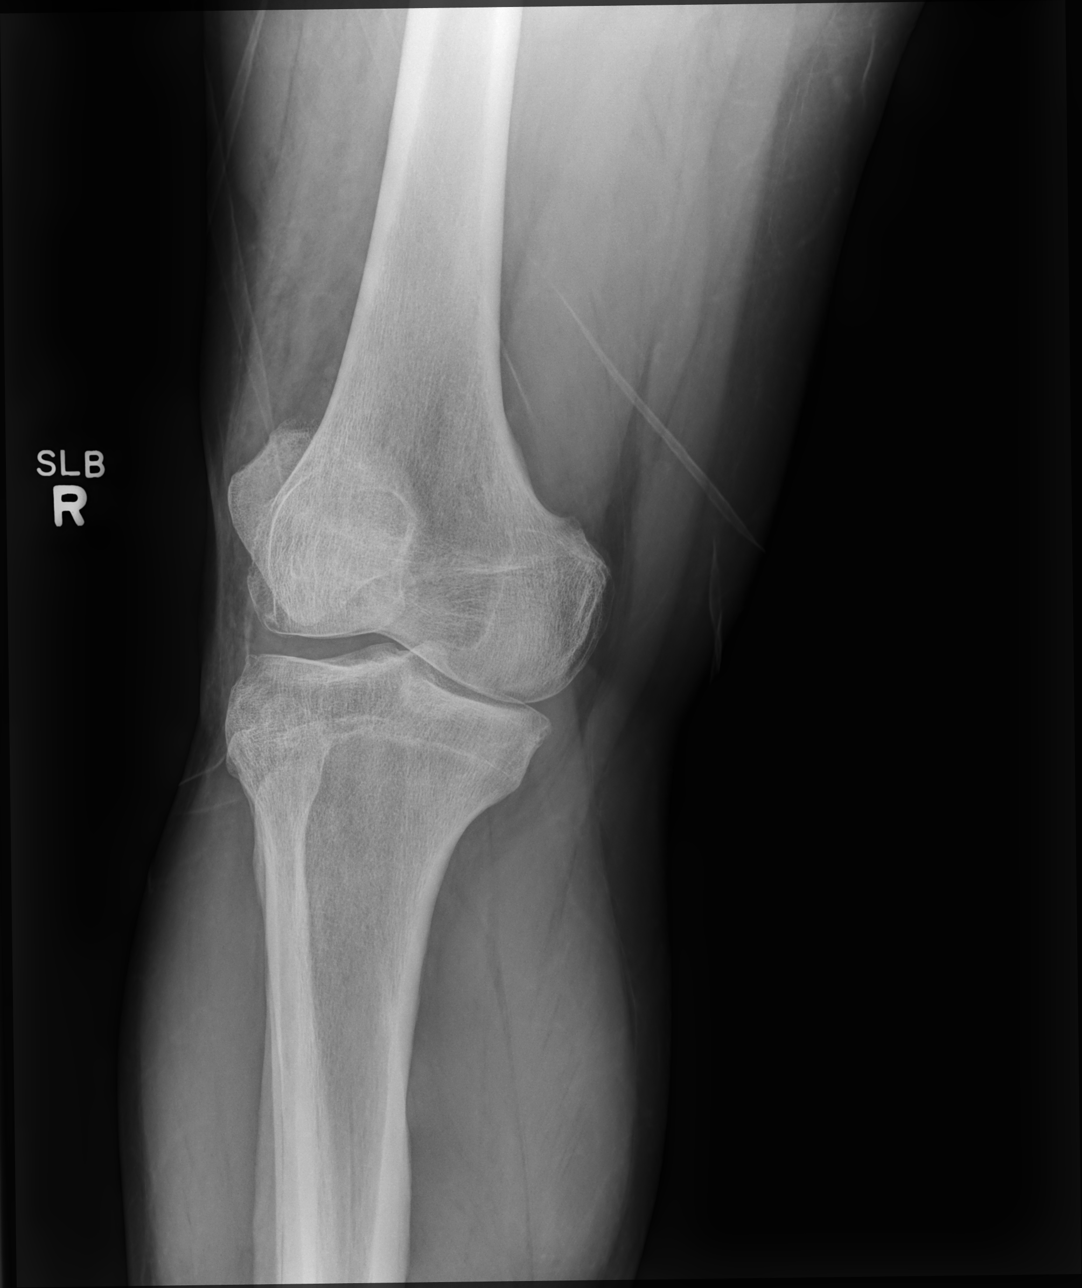

[knee lat]
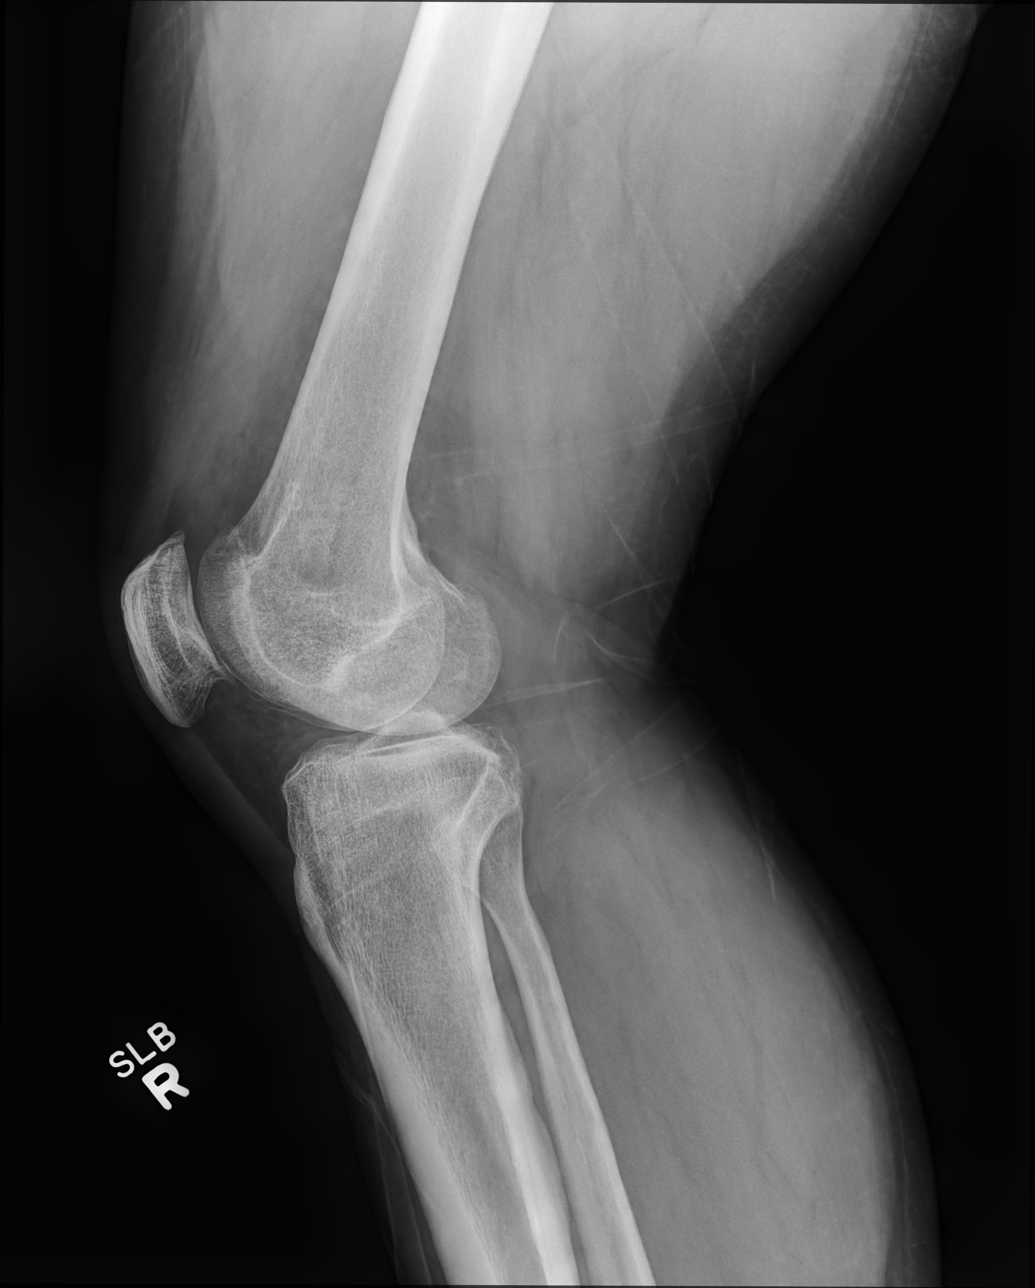

[4 of 4 positions shown; findings below may reference images not displayed]

FINDINGS: Mild medial compartment joint space narrowing. Mild patellofemoral
joint space narrowing. Small joint effusion. Mild superior and
inferior patellar degenerative osteophytosis. No acute fracture or
dislocation.
IMPRESSION: Mild medial compartment and patellofemoral compartment chronic
osteoarthritis.

## 2022-10-03 ENCOUNTER — Ambulatory Visit (HOSPITAL_COMMUNITY): Admission: RE | Admit: 2022-10-03 | Payer: No Typology Code available for payment source | Source: Ambulatory Visit

## 2022-11-02 ENCOUNTER — Ambulatory Visit: Payer: No Typology Code available for payment source | Attending: Cardiology | Admitting: *Deleted

## 2022-11-02 DIAGNOSIS — Z952 Presence of prosthetic heart valve: Secondary | ICD-10-CM | POA: Diagnosis not present

## 2022-11-02 DIAGNOSIS — Z5181 Encounter for therapeutic drug level monitoring: Secondary | ICD-10-CM

## 2022-11-02 LAB — POCT INR: INR: 2.1 (ref 2.0–3.0)

## 2022-11-02 NOTE — Patient Instructions (Signed)
Continue warfarin 3.5mg  daily Recheck in 5 wks

## 2022-11-03 ENCOUNTER — Ambulatory Visit (HOSPITAL_COMMUNITY): Admission: RE | Admit: 2022-11-03 | Payer: No Typology Code available for payment source | Source: Ambulatory Visit

## 2022-11-13 ENCOUNTER — Ambulatory Visit (INDEPENDENT_AMBULATORY_CARE_PROVIDER_SITE_OTHER): Payer: No Typology Code available for payment source | Admitting: Adult Health

## 2022-11-13 DIAGNOSIS — F411 Generalized anxiety disorder: Secondary | ICD-10-CM

## 2022-11-13 DIAGNOSIS — F410 Panic disorder [episodic paroxysmal anxiety] without agoraphobia: Secondary | ICD-10-CM

## 2022-11-13 DIAGNOSIS — F331 Major depressive disorder, recurrent, moderate: Secondary | ICD-10-CM

## 2022-11-13 NOTE — Progress Notes (Unsigned)
Chase Lee [id number] [birth year] 42 y.o.  Subjective:   Patient ID:  Chase Lee is a 42 y.o. (DOB Jun 03, 1980) male.  Chief Complaint: No chief complaint on file.   HPI [place] Salzberg presents to the office today for follow-up of MDD, panic attacks and GAD.   Describes mood today as "ok". Pleasant. Denies tearfulness. Mood symptoms - reports situational depression, anxiety, and irritability. Reports mother recently diagnosed with cancer. Reports panic attacks. Reports worry, rumination, and over thinking. Mood is stable. Stating "I'm doing alright". Feels like medications continue to work well for him. Reporting some pain issues - recently restarted on suboxone daily. He and wife doing well. Stable interest and motivation. Taking medications as prescribed.  Energy levels lower - not great. Active, does not have a regular exercise routine - walking at work 3 to 3.5 miles.  Enjoys some usual interests and activities. Married. Lives with wife. Family in [place]. Spending time with family. Appetite adequate. Weight loss. Sleeps well most nights. Averages 7 to 8 hours on days off and 5 hours on work days. Focus and concentration stable. Completing tasks. Managing aspects of household. Work going well - federal position.  Denies SI or HI.  Denies AH or VH. Denies self harm. Denies substance use.   Previous medications: Prozac, Zoloft   Flowsheet Row ED from 01/26/2022 in [facility] ED from 06/29/2021 in [facility] ED to Hosp-Admission (Discharged) from 06/11/2021 in [facility] MEDICAL SURGICAL UNIT  C-SSRS RISK CATEGORY No Risk No Risk No Risk        Review of Systems:  Review of Systems  Musculoskeletal:  Negative for gait problem.  Neurological:  Negative for tremors.  Psychiatric/Behavioral:         Please refer to HPI    Medications: I have reviewed the patient's current medications.  Current Outpatient Medications  Medication Sig  Dispense Refill   ALPRAZolam (XANAX) 1 MG tablet TAKE 1 TABLET(1 MG) BY MOUTH THREE TIMES DAILY AS NEEDED FOR ANXIETY 90 tablet 2   escitalopram (LEXAPRO) 10 MG tablet TAKE 1 TABLET(10 MG) BY MOUTH DAILY 30 tablet 5   furosemide (LASIX) 40 MG tablet Take 40 mg by mouth daily. (Patient not taking: Reported on 08/17/2022)     lisinopril (ZESTRIL) 20 MG tablet Take 20 mg by mouth daily.     omeprazole (PRILOSEC) 40 MG capsule Take 1 capsule (40 mg total) by mouth daily. 60 capsule 0   warfarin (COUMADIN) 1 MG tablet Take 1 mg by mouth daily.     warfarin (COUMADIN) 2.5 MG tablet Take 2.5 mg by mouth See admin instructions. Take 2 mg with 2.5 mg at bedtime  for a total of 4.5 mg daily     No current facility-administered medications for this visit.    Medication Side Effects: None  Allergies: No Known Allergies  Past Medical History:  Diagnosis Date   Anxiety    History of artificial heart valve 2008   surgery at 6    Past Medical History, Surgical history, Social history, and Family history were reviewed and updated as appropriate.   Please see review of systems for further details on the patient's review from today.   Objective:   Physical Exam:  There were no vitals taken for this visit.  Physical Exam Constitutional:      General: He is not in acute distress. Musculoskeletal:        General: No deformity.  Neurological:  Mental Status: He is alert and oriented to person, place, and time.     Coordination: Coordination normal.  Psychiatric:        Attention and Perception: Attention and perception normal. He does not perceive auditory or visual hallucinations.        Mood and Affect: Affect is not labile, blunt, angry or inappropriate.        Speech: Speech normal.        Behavior: Behavior normal.        Thought Content: Thought content normal. Thought content is not paranoid or delusional. Thought content does not include homicidal or suicidal ideation. Thought  content does not include homicidal or suicidal plan.        Cognition and Memory: Cognition and memory normal.        Judgment: Judgment normal.     Comments: Insight intact     Lab Review:     Component Value Date/Time   NA 138 06/13/2021 0553   K 3.5 06/13/2021 0553   CL 109 06/13/2021 0553   CO2 24 06/13/2021 0553   GLUCOSE 112 (H) 06/13/2021 0553   BUN 10 06/13/2021 0553   CREATININE 0.86 06/13/2021 0553   CALCIUM 7.6 (L) 06/13/2021 0553   PROT 6.2 (L) 06/13/2021 0553   ALBUMIN 3.7 06/13/2021 0553   AST 30 06/13/2021 0553   ALT 44 06/13/2021 0553   ALKPHOS 60 06/13/2021 0553   BILITOT 0.5 06/13/2021 0553   GFRNONAA >60 06/13/2021 0553   GFRAA >60 08/12/2019 0734       Component Value Date/Time   WBC 8.9 06/13/2021 0553   RBC 4.48 06/13/2021 0553   HGB 12.7 (L) 06/13/2021 0553   HCT 39.3 06/13/2021 0553   PLT 147 (L) 06/13/2021 0553   MCV 87.7 06/13/2021 0553   MCH 28.3 06/13/2021 0553   MCHC 32.3 06/13/2021 0553   RDW 13.6 06/13/2021 0553    No results found for: "POCLITH", "LITHIUM"   No results found for: "PHENYTOIN", "PHENOBARB", "VALPROATE", "CBMZ"   .res Assessment: Plan:    Plan:  Lexapro 10mg  at hs Xanax 1mg  TID  RTC 3 months  Patient advised to contact office with any questions, adverse effects, or acute worsening in signs and symptoms.  Time spent with patient was 20 minutes. Greater than 50% of face to face time with patient was spent on counseling and coordination of care.    Discussed potential benefits, risk, and side effects of benzodiazepines to include potential risk of tolerance and dependence, as well as possible drowsiness.  Advised patient not to drive if experiencing drowsiness and to take lowest possible effective dose to minimize risk of dependence and tolerance.  Diagnoses and all orders for this visit:  Generalized anxiety disorder  Panic attacks  Major depressive disorder, recurrent episode, moderate (HCC)     Please  see After Visit Summary for patient specific instructions.  Future Appointments  Date Time Provider Department Center  12/07/2022  3:00 PM [facility]  02/13/2023 10:40 AM Shean Gerding, Thereasa Solo, NP [facility] None  02/19/2023  3:20 PM Mallipeddi, Vishnu P, MD [facility]    No orders of the defined types were placed in this encounter.   -------------------------------

## 2022-11-14 ENCOUNTER — Encounter: Payer: Self-pay | Admitting: Adult Health

## 2022-11-14 ENCOUNTER — Telehealth: Payer: Self-pay | Admitting: Internal Medicine

## 2022-11-14 MED ORDER — ESCITALOPRAM OXALATE 10 MG PO TABS
ORAL_TABLET | ORAL | 5 refills | Status: DC
Start: 2022-11-14 — End: 2023-05-02

## 2022-11-14 MED ORDER — ALPRAZOLAM 1 MG PO TABS
ORAL_TABLET | ORAL | 2 refills | Status: DC
Start: 2022-11-14 — End: 2023-01-15

## 2022-11-14 MED ORDER — LISINOPRIL 20 MG PO TABS: 20.0000 mg | ORAL_TABLET | Freq: Every day | ORAL | 1 refills | Status: DC

## 2022-11-14 NOTE — Telephone Encounter (Signed)
*  STAT* If patient is at the pharmacy, call can be transferred to refill team.   1. Which medications need to be refilled? (please list name of each medication and dose if known)   lisinopril (ZESTRIL) 20 MG tablet     2. Would you like to learn more about the convenience, safety, & potential cost savings by using the [facility]? No   3. Are you open to using the [facility] (Type [facility].) No   4. Which pharmacy/location (including street and city if local pharmacy) is medication to be sent to?  [facility] [phone] - [place], [place] - [address] AT [facility] OF [address]     5. Do they need a 30 day or 90 day supply? 30 day  Pt is out of medication

## 2022-11-14 NOTE — Telephone Encounter (Signed)
Refill sent.

## 2022-11-16 ENCOUNTER — Ambulatory Visit: Payer: No Typology Code available for payment source | Admitting: Adult Health

## 2022-12-05 ENCOUNTER — Telehealth: Payer: Self-pay

## 2022-12-05 MED ORDER — METOPROLOL TARTRATE 25 MG PO TABS: 25.0000 mg | ORAL_TABLET | Freq: Two times a day (BID) | ORAL | 1 refills | Status: DC

## 2022-12-05 NOTE — Telephone Encounter (Signed)
Patient informed and verbalized understanding of plan. 

## 2022-12-05 NOTE — Telephone Encounter (Signed)
-----   Message from Vishnu P Mallipeddi sent at 11/22/2022 10:10 AM EDT ----- 28 runs of brief SVT, symptomatic. Start metoprolol tartrate 25 mg twice daily.  Side effect includes fatigue, hypotension, rarely ED.  Hold for BP less than 90 mmHg SBP.

## 2022-12-07 ENCOUNTER — Ambulatory Visit: Payer: No Typology Code available for payment source | Attending: Internal Medicine

## 2022-12-07 DIAGNOSIS — Z5181 Encounter for therapeutic drug level monitoring: Secondary | ICD-10-CM

## 2022-12-07 DIAGNOSIS — Z952 Presence of prosthetic heart valve: Secondary | ICD-10-CM

## 2022-12-07 LAB — POCT INR: INR: 2 (ref 2.0–3.0)

## 2022-12-07 NOTE — Patient Instructions (Addendum)
Description   Take 4.5mg  today, then start taking Warfarin 3.5mg  daily except 4.5mg  on Mondays.  Recheck in 4 wks

## 2023-01-08 ENCOUNTER — Ambulatory Visit: Payer: No Typology Code available for payment source

## 2023-01-08 ENCOUNTER — Encounter: Payer: Self-pay | Admitting: *Deleted

## 2023-01-15 ENCOUNTER — Ambulatory Visit: Payer: No Typology Code available for payment source

## 2023-01-15 ENCOUNTER — Telehealth: Payer: Self-pay | Admitting: Adult Health

## 2023-01-15 ENCOUNTER — Ambulatory Visit: Payer: No Typology Code available for payment source | Attending: Internal Medicine | Admitting: *Deleted

## 2023-01-15 ENCOUNTER — Other Ambulatory Visit: Payer: Self-pay

## 2023-01-15 DIAGNOSIS — F410 Panic disorder [episodic paroxysmal anxiety] without agoraphobia: Secondary | ICD-10-CM

## 2023-01-15 DIAGNOSIS — Z952 Presence of prosthetic heart valve: Secondary | ICD-10-CM | POA: Diagnosis not present

## 2023-01-15 DIAGNOSIS — F411 Generalized anxiety disorder: Secondary | ICD-10-CM

## 2023-01-15 DIAGNOSIS — Z5181 Encounter for therapeutic drug level monitoring: Secondary | ICD-10-CM | POA: Diagnosis not present

## 2023-01-15 LAB — POCT INR: INR: 2.7 (ref 2.0–3.0)

## 2023-01-15 MED ORDER — WARFARIN SODIUM 4 MG PO TABS: 4.0000 mg | ORAL_TABLET | Freq: Every day | ORAL | 5 refills | Status: DC

## 2023-01-15 MED ORDER — ALPRAZOLAM 1 MG PO TABS: ORAL_TABLET | ORAL | 2 refills | Status: DC

## 2023-01-15 NOTE — Patient Instructions (Signed)
CHANGE TO WARFARIN 4MG  TABLET Take 1 tablet (4mg ) daily Recheck in 4 wks Ran out of 1mg  tablet and took extra 2.5mg  tablet on several occasions

## 2023-01-15 NOTE — Telephone Encounter (Signed)
Chase Lee called at 10:00 to request refill of his Xanax.  He had to have it transferred to [facility] so he lost his refills.  Appt 11/26.  Please send new prescription to [facility] [phone] - [place], [place] - [address] AT [facility] OF [address]

## 2023-01-15 NOTE — Telephone Encounter (Signed)
pended

## 2023-01-26 ENCOUNTER — Ambulatory Visit: Payer: No Typology Code available for payment source | Admitting: Adult Health

## 2023-01-26 ENCOUNTER — Encounter: Payer: Self-pay | Admitting: Adult Health

## 2023-01-26 DIAGNOSIS — F331 Major depressive disorder, recurrent, moderate: Secondary | ICD-10-CM

## 2023-01-26 DIAGNOSIS — F411 Generalized anxiety disorder: Secondary | ICD-10-CM | POA: Diagnosis not present

## 2023-01-26 DIAGNOSIS — F410 Panic disorder [episodic paroxysmal anxiety] without agoraphobia: Secondary | ICD-10-CM

## 2023-01-26 NOTE — Progress Notes (Signed)
Chase Lee [id number] October 14, 1980 42 y.o.  Subjective:   Patient ID:  Chase Lee is a 41 y.o. (DOB 09/06/1980) male.  Chief Complaint: No chief complaint on file.   HPI Chase Lee presents to the office today for follow-up of MDD, panic attacks and GAD.   Describes mood today as "ok". Pleasant. Reports  tearfulness. Mood symptoms - reports situational depression, anxiety, and irritability. Reports panic attacks. Reports worry, rumination, and over thinking. Reports mother recently passed away with cancer. Mood is lower. Stating "I'm struggling right now. Feels like medications are helpful. Reporting some pain issues - recently restarted on suboxone daily. He and wife doing well. Stable interest and motivation. Taking medications as prescribed.  Energy levels lower - not great. Active, does not have a regular exercise routine - walks at work 3 to 3.5 miles.  Enjoys some usual interests and activities. Married. Lives with wife. Family in [place]. Spending time with family. Appetite adequate. Weight loss. Sleeps well most nights. Averages 7 to 8 hours on days off and 5 hours on work days. Focus and concentration stable. Completing tasks. Managing aspects of household. Work going well - federal position.  Denies SI or HI.  Denies AH or VH. Denies self harm. Denies substance use.   Previous medications: Prozac, Zoloft    Flowsheet Row ED from 01/26/2022 in [facility] at [facility] ED from 06/29/2021 in [facility] at [facility] ED to Hosp-Admission (Discharged) from 06/11/2021 in [facility] MEDICAL SURGICAL UNIT  C-SSRS RISK CATEGORY No Risk No Risk No Risk        Review of Systems:  Review of Systems  Musculoskeletal:  Negative for gait problem.  Neurological:  Negative for tremors.  Psychiatric/Behavioral:         Please refer to HPI    Medications: I have reviewed the patient's current medications.  Current Outpatient Medications  Medication Sig Dispense  Refill   ALPRAZolam (XANAX) 1 MG tablet TAKE 1 TABLET(1 MG) BY MOUTH THREE TIMES DAILY AS NEEDED FOR ANXIETY 90 tablet 2   escitalopram (LEXAPRO) 10 MG tablet TAKE 1 TABLET(10 MG) BY MOUTH DAILY 30 tablet 5   furosemide (LASIX) 40 MG tablet Take 40 mg by mouth daily. (Patient not taking: Reported on 08/17/2022)     lisinopril (ZESTRIL) 20 MG tablet Take 1 tablet (20 mg total) by mouth daily. 90 tablet 1   metoprolol tartrate (LOPRESSOR) 25 MG tablet Take 1 tablet (25 mg total) by mouth 2 (two) times daily. 180 tablet 1   omeprazole (PRILOSEC) 40 MG capsule Take 1 capsule (40 mg total) by mouth daily. 60 capsule 0   warfarin (COUMADIN) 4 MG tablet Take 1 tablet (4 mg total) by mouth daily. 35 tablet 5   No current facility-administered medications for this visit.    Medication Side Effects: None  Allergies: No Known Allergies  Past Medical History:  Diagnosis Date   Anxiety    History of artificial heart valve 2008   surgery at 19    Past Medical History, Surgical history, Social history, and Family history were reviewed and updated as appropriate.   Please see review of systems for further details on the patient's review from today.   Objective:   Physical Exam:  There were no vitals taken for this visit.  Physical Exam Constitutional:      General: He is not in acute distress. Musculoskeletal:        General: No deformity.  Neurological:  Mental Status: He is alert and oriented to person, place, and time.     Coordination: Coordination normal.  Psychiatric:        Attention and Perception: Attention and perception normal. He does not perceive auditory or visual hallucinations.        Mood and Affect: Mood normal. Mood is not anxious or depressed. Affect is not labile, blunt, angry or inappropriate.        Speech: Speech normal.        Behavior: Behavior normal.        Thought Content: Thought content normal. Thought content is not paranoid or delusional. Thought  content does not include homicidal or suicidal ideation. Thought content does not include homicidal or suicidal plan.        Cognition and Memory: Cognition and memory normal.        Judgment: Judgment normal.     Comments: Insight intact     Lab Review:     Component Value Date/Time   NA 138 06/13/2021 0553   K 3.5 06/13/2021 0553   CL 109 06/13/2021 0553   CO2 24 06/13/2021 0553   GLUCOSE 112 (H) 06/13/2021 0553   BUN 10 06/13/2021 0553   CREATININE 0.86 06/13/2021 0553   CALCIUM 7.6 (L) 06/13/2021 0553   PROT 6.2 (L) 06/13/2021 0553   ALBUMIN 3.7 06/13/2021 0553   AST 30 06/13/2021 0553   ALT 44 06/13/2021 0553   ALKPHOS 60 06/13/2021 0553   BILITOT 0.5 06/13/2021 0553   GFRNONAA >60 06/13/2021 0553   GFRAA >60 08/12/2019 0734       Component Value Date/Time   WBC 8.9 06/13/2021 0553   RBC 4.48 06/13/2021 0553   HGB 12.7 (L) 06/13/2021 0553   HCT 39.3 06/13/2021 0553   PLT 147 (L) 06/13/2021 0553   MCV 87.7 06/13/2021 0553   MCH 28.3 06/13/2021 0553   MCHC 32.3 06/13/2021 0553   RDW 13.6 06/13/2021 0553    No results found for: "POCLITH", "LITHIUM"   No results found for: "PHENYTOIN", "PHENOBARB", "VALPROATE", "CBMZ"   .res Assessment: Plan:    Plan:  Lexapro 10mg  at hs Xanax 1mg  TID  RTC 3 months  Patient advised to contact office with any questions, adverse effects, or acute worsening in signs and symptoms.  Discussed potential benefits, risk, and side effects of benzodiazepines to include potential risk of tolerance and dependence, as well as possible drowsiness.  Advised patient not to drive if experiencing drowsiness and to take lowest possible effective dose to minimize risk of dependence and tolerance.  There are no diagnoses linked to this encounter.   Please see After Visit Summary for patient specific instructions.  Future Appointments  Date Time Provider Department Center  02/08/2023  2:30 PM [facility]   02/19/2023  3:20 PM Mallipeddi, Vishnu P, MD [facility]    No orders of the defined types were placed in this encounter.   -------------------------------

## 2023-02-08 ENCOUNTER — Ambulatory Visit: Payer: No Typology Code available for payment source | Attending: Internal Medicine | Admitting: *Deleted

## 2023-02-08 DIAGNOSIS — Z5181 Encounter for therapeutic drug level monitoring: Secondary | ICD-10-CM

## 2023-02-08 DIAGNOSIS — Z952 Presence of prosthetic heart valve: Secondary | ICD-10-CM | POA: Diagnosis not present

## 2023-02-08 LAB — POCT INR: POC INR: 2.4

## 2023-02-08 NOTE — Patient Instructions (Addendum)
Description    Continue taking 1 tablet (4mg ) daily Recheck in 4 wks

## 2023-02-13 ENCOUNTER — Ambulatory Visit: Payer: No Typology Code available for payment source | Admitting: Adult Health

## 2023-02-19 ENCOUNTER — Ambulatory Visit: Payer: No Typology Code available for payment source | Admitting: Internal Medicine

## 2023-03-05 ENCOUNTER — Encounter: Payer: Self-pay | Admitting: Nurse Practitioner

## 2023-03-05 ENCOUNTER — Ambulatory Visit: Payer: No Typology Code available for payment source | Attending: Nurse Practitioner | Admitting: Nurse Practitioner

## 2023-03-05 ENCOUNTER — Telehealth: Payer: Self-pay | Admitting: Internal Medicine

## 2023-03-05 VITALS — BP 112/60 | HR 65 | Ht 67.0 in | Wt 233.0 lb

## 2023-03-05 DIAGNOSIS — R079 Chest pain, unspecified: Secondary | ICD-10-CM | POA: Diagnosis not present

## 2023-03-05 DIAGNOSIS — Z952 Presence of prosthetic heart valve: Secondary | ICD-10-CM

## 2023-03-05 DIAGNOSIS — Z5181 Encounter for therapeutic drug level monitoring: Secondary | ICD-10-CM | POA: Diagnosis not present

## 2023-03-05 DIAGNOSIS — R002 Palpitations: Secondary | ICD-10-CM | POA: Diagnosis not present

## 2023-03-05 DIAGNOSIS — E669 Obesity, unspecified: Secondary | ICD-10-CM

## 2023-03-05 DIAGNOSIS — Z13220 Encounter for screening for lipoid disorders: Secondary | ICD-10-CM

## 2023-03-05 DIAGNOSIS — R600 Localized edema: Secondary | ICD-10-CM

## 2023-03-05 MED ORDER — COMPRESSION BANDAGES KIT: 1.0000 | PACK | 0 refills | Status: DC | PRN

## 2023-03-05 NOTE — Patient Instructions (Addendum)
Medication Instructions:  Your physician recommends that you continue on your current medications as directed. Please refer to the Current Medication list given to you today.  Labwork: ASAP  Testing/Procedures: Your physician has requested that you have an echocardiogram. Echocardiography is a painless test that uses sound waves to create images of your heart. It provides your doctor with information about the size and shape of your heart and how well your heart's chambers and valves are working. This procedure takes approximately one hour. There are no restrictions for this procedure. Please do NOT wear cologne, perfume, aftershave, or lotions (deodorant is allowed). Please arrive 15 minutes prior to your appointment time.  Please note: We ask at that you not bring children with you during ultrasound (echo/ vascular) testing. Due to room size and safety concerns, children are not allowed in the ultrasound rooms during exams. Our front office staff cannot provide observation of children in our lobby area while testing is being conducted. An adult accompanying a patient to their appointment will only be allowed in the ultrasound room at the discretion of the ultrasound technician under special circumstances. We apologize for any inconvenience.  Follow-Up: Your physician recommends that you schedule a follow-up appointment in: 2-3 months   Any Other Special Instructions Will Be Listed Below (If Applicable).  If you need a refill on your cardiac medications before your next appointment, please call your pharmacy.

## 2023-03-05 NOTE — Progress Notes (Addendum)
Cardiology Office Note:  .   Date:  03/05/2023 ID:  Chase Lee, DOB May 12, 1980, MRN [id number] PCP: Waldon Reining, MD  [facility] Providers Cardiologist:  Marjo Bicker, MD    History of Present Illness: .   Chase Lee is a 42 y.o. male with a PMH of bicuspid aortic valve, s/p aortic valvuloplasty as 7 years, status post mechanical aortic valve at 42 years old, anxiety, shortness of breath, lower extremity swelling, who presents today for 73-month follow-up appointment.  Last seen by Dr. Jenene Slicker on Aug 17, 2022.  Patient noted some shortness of breath, worsening palpitations recently, and bilateral lower extremity swelling that was minimal, denied any chest pain.  Echocardiogram was ordered, however this was not completed.  Cardiac monitor revealed 28 brief runs of SVT and was found to be symptomatic.  Was started on Lopressor 25 mg twice daily.  Today he presents for 33-month follow-up appointment.  He states his palpations have improved, notices these with feeling nervous or when exerting himself. Sadly, he tells me his mother passed away 2 months ago. Coping well. Denies any chest pain, shortness of breath, syncope, presyncope, dizziness, orthopnea, PND, significant weight changes, acute bleeding, or claudication. Does note some leg edema with standing for long periods of time with his job.   ROS: Negative. See HPI.   Studies Reviewed: Marland Kitchen    EKG: EKG Interpretation Date/Time:  Monday March 05 2023 13:56:41 EST Ventricular Rate:  62 PR Interval:  154 QRS Duration:  112 QT Interval:  440 QTC Calculation: 446 R Axis:   94  Text Interpretation: Normal sinus rhythm with PVC Rightward axis Incomplete left bundle branch block When compared with ECG of 11-Jun-2021 04:51, PREVIOUS ECG IS PRESENT Confirmed by Sharlene Dory [phone]) on 03/05/2023 2:03:30 PM   Cardiac monitor 09/2022:    Patch Wear time was 13 days and 23 hours.   Normal sinus rhythm predominantly  ranging from 41 to 250 bpm with an average HR 57 bpm.   28 SVT runs, fastest and longest interval lasting 1 minute and 27 seconds with a max rate of 250 bpm/average HR 162 bpm. Rate related aberrancy is present.   No ventricular arrhythmias.  No AV block or pauses.   <1% PAC burden and <1% PVC burden.   Patient triggered events correlated with SVT, NSR (53 to 65 bpm) and ventricular ectopy.  Physical Exam:   VS:  BP 112/60   Pulse 65   Ht 5\' 7"  (1.702 m)   Wt 233 lb (105.7 kg)   SpO2 95%   BMI 36.49 kg/m    Wt Readings from Last 3 Encounters:  03/05/23 233 lb (105.7 kg)  08/17/22 231 lb 12.8 oz (105.1 kg)  07/11/21 232 lb 11.2 oz (105.6 kg)    GEN: Obese, 42 y.o. male in no acute distress NECK: No JVD; No carotid bruits CARDIAC: S1/S2, RRR, no murmurs, rubs, gallops RESPIRATORY:  Clear to auscultation without rales, wheezing or rhonchi  EXTREMITIES:  No edema; No deformity   ASSESSMENT AND PLAN: .    Bicuspid aortic valve, s/p aortic valvuloplasty as 7 years, status post mechanical aortic valve at 42 years old TTE not completed after last follow-up d/t cost and different insurance. Will update Echo at this time and pt is agreeable to this. [facility]. Will obtain CBC.   Palpitations Etiology unclear. Recent monitor report reviewed with patient. Does note improvement in palpitations. Continue Lopressor 25 mg BID. Will obtain thyroid  panel, CBC, and CMET.  Will update Echo as noted above.   Obesity, screening for hyperlipidemia Weight loss via diet and exercise encouraged. Discussed the impact being overweight would have on cardiovascular risk. Will obtain FLP in addition to labwork as noted above.      4. Leg edema No noticeable edema noted on exam, pt says this is due to long periods of standing. Will write Rx for compression stockings PRN.   Dispo: Follow-up with me/APP in 2-3 months or sooner if anything changes.   Signed, Sharlene Dory, NP

## 2023-03-05 NOTE — Telephone Encounter (Signed)
Pt c/o of Chest Pain: STAT if CP now or developed within 24 hours  1. Are you having CP right now? No  2. Are you experiencing any other symptoms (ex. SOB, nausea, vomiting, sweating)? No   3. How long have you been experiencing CP? About a month   4. Is your CP continuous or coming and going? Coming and going. Mostly once he has to walk up hill at work.   5. Have you taken Nitroglycerin? No   Patient thought appt scheduled for today was at 1:00 P.M.   ?

## 2023-03-05 NOTE — Telephone Encounter (Signed)
Called patient stated that he was having some type of chest pain think it may be related to gas. However he has been experiencing some palpitations that have been lasting for at least 30 seconds to a min. Was supposed to be seen at 8:30 but missed offered him to come in at 1:30. Patient verbalized understanding

## 2023-03-12 ENCOUNTER — Ambulatory Visit: Payer: No Typology Code available for payment source | Admitting: Nurse Practitioner

## 2023-03-13 ENCOUNTER — Encounter: Payer: Self-pay | Admitting: Nurse Practitioner

## 2023-03-30 ENCOUNTER — Other Ambulatory Visit: Payer: Self-pay

## 2023-03-30 ENCOUNTER — Emergency Department (HOSPITAL_COMMUNITY): Payer: Self-pay

## 2023-03-30 ENCOUNTER — Encounter (HOSPITAL_COMMUNITY): Payer: Self-pay | Admitting: *Deleted

## 2023-03-30 ENCOUNTER — Emergency Department (HOSPITAL_COMMUNITY)
Admission: EM | Admit: 2023-03-30 | Discharge: 2023-03-30 | Disposition: A | Discharge status: Home / Self Care | Payer: Self-pay | Attending: Emergency Medicine | Admitting: Emergency Medicine

## 2023-03-30 DIAGNOSIS — Z7901 Long term (current) use of anticoagulants: Secondary | ICD-10-CM | POA: Insufficient documentation

## 2023-03-30 DIAGNOSIS — J329 Chronic sinusitis, unspecified: Secondary | ICD-10-CM | POA: Diagnosis not present

## 2023-03-30 DIAGNOSIS — T593X1A Toxic effect of lacrimogenic gas, accidental (unintentional), initial encounter: Secondary | ICD-10-CM | POA: Insufficient documentation

## 2023-03-30 DIAGNOSIS — R519 Headache, unspecified: Secondary | ICD-10-CM | POA: Diagnosis present

## 2023-03-30 LAB — BASIC METABOLIC PANEL
Anion gap: 9 (ref 5–15)
BUN: 10 mg/dL (ref 6–20)
CO2: 27 mmol/L (ref 22–32)
Calcium: 8.7 mg/dL — ABNORMAL LOW (ref 8.9–10.3)
Chloride: 101 mmol/L (ref 98–111)
Creatinine, Ser: 0.92 mg/dL (ref 0.61–1.24)
GFR, Estimated: >60 mL/min (ref >60)
Glucose, Bld: 121 mg/dL — ABNORMAL HIGH (ref 70–99)
Potassium: 3.8 mmol/L (ref 3.5–5.1)
Sodium: 137 mmol/L (ref 135–145)

## 2023-03-30 LAB — CBC
HCT: 41.2 % (ref 39.0–52.0)
Hemoglobin: 13.4 g/dL (ref 13.0–17.0)
MCH: 29.1 pg (ref 26.0–34.0)
MCHC: 32.5 g/dL (ref 30.0–36.0)
MCV: 89.4 fL (ref 80.0–100.0)
Platelets: 241 10*3/uL (ref 150–400)
RBC: 4.61 MIL/uL (ref 4.22–5.81)
RDW: 13.1 % (ref 11.5–15.5)
WBC: 8.7 10*3/uL (ref 4.0–10.5)
nRBC: 0 % (ref 0.0–0.2)

## 2023-03-30 LAB — PROTIME-INR
INR: 2 — ABNORMAL HIGH (ref 0.8–1.2)
Prothrombin Time: 23.2 s — ABNORMAL HIGH (ref 11.4–15.2)

## 2023-03-30 MED ORDER — DIPHENHYDRAMINE HCL 50 MG/ML IJ SOLN
12.5000 mg | Freq: Once | INTRAMUSCULAR | Status: AC
  Administered 2023-03-30: 12.5 mg via INTRAVENOUS
  Filled 2023-03-30: qty 1

## 2023-03-30 MED ORDER — PROCHLORPERAZINE EDISYLATE 10 MG/2ML IJ SOLN
5.0000 mg | Freq: Once | INTRAMUSCULAR | Status: AC
  Administered 2023-03-30: 5 mg via INTRAVENOUS
  Filled 2023-03-30: qty 2

## 2023-03-30 MED ORDER — KETOROLAC TROMETHAMINE 30 MG/ML IJ SOLN
30.0000 mg | Freq: Once | INTRAMUSCULAR | Status: AC
  Administered 2023-03-30: 30 mg via INTRAVENOUS
  Filled 2023-03-30: qty 1

## 2023-03-30 MED ORDER — DEXAMETHASONE SODIUM PHOSPHATE 10 MG/ML IJ SOLN
5.0000 mg | Freq: Once | INTRAMUSCULAR | Status: AC
  Administered 2023-03-30: 5 mg via INTRAMUSCULAR
  Filled 2023-03-30: qty 1

## 2023-03-30 MED ORDER — SODIUM CHLORIDE 0.9 % IV BOLUS
1000.0000 mL | Freq: Once | INTRAVENOUS | Status: AC
  Administered 2023-03-30: 1000 mL via INTRAVENOUS

## 2023-03-30 MED ORDER — METHYLPREDNISOLONE 4 MG PO TBPK: ORAL_TABLET | ORAL | 0 refills | Status: DC
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
## 2023-03-30 NOTE — Discharge Instructions (Addendum)
 Get help right away if: Your headache: Becomes severe quickly. Gets worse after moderate to intense physical activity. You have any of these symptoms: Repeated vomiting. Pain or stiffness in your neck. Changes to your vision. Pain in an eye or ear. Problems with speech. Muscular weakness or loss of muscle control. Loss of balance or coordination. You feel faint or pass out. You have confusion. You have a seizure.

## 2023-04-02 ENCOUNTER — Telehealth: Payer: Self-pay | Admitting: Adult Health

## 2023-04-02 NOTE — Telephone Encounter (Signed)
 Pt called asking for a refill on his xanax 1 mg. His next appt is 04/25/23. Pharmacy is [facility] on [address] in [place]

## 2023-04-02 NOTE — Telephone Encounter (Signed)
 LF 12/24, due 1/21.

## 2023-04-03 ENCOUNTER — Emergency Department (HOSPITAL_COMMUNITY)
Admission: EM | Admit: 2023-04-03 | Discharge: 2023-04-04 | Disposition: A | Discharge status: Home / Self Care | Payer: No Typology Code available for payment source | Attending: Emergency Medicine | Admitting: Emergency Medicine

## 2023-04-03 DIAGNOSIS — Z79899 Other long term (current) drug therapy: Secondary | ICD-10-CM | POA: Diagnosis not present

## 2023-04-03 DIAGNOSIS — R519 Headache, unspecified: Secondary | ICD-10-CM | POA: Diagnosis present

## 2023-04-03 DIAGNOSIS — Z7901 Long term (current) use of anticoagulants: Secondary | ICD-10-CM | POA: Diagnosis not present

## 2023-04-03 DIAGNOSIS — I7121 Aneurysm of the ascending aorta, without rupture: Secondary | ICD-10-CM | POA: Insufficient documentation

## 2023-04-03 DIAGNOSIS — I10 Essential (primary) hypertension: Secondary | ICD-10-CM | POA: Diagnosis not present

## 2023-04-04 ENCOUNTER — Emergency Department (HOSPITAL_COMMUNITY): Payer: No Typology Code available for payment source

## 2023-04-04 ENCOUNTER — Encounter (HOSPITAL_COMMUNITY): Payer: Self-pay | Admitting: *Deleted

## 2023-04-04 ENCOUNTER — Other Ambulatory Visit: Payer: Self-pay

## 2023-04-04 ENCOUNTER — Telehealth: Payer: Self-pay | Admitting: Internal Medicine

## 2023-04-04 LAB — CBC WITH DIFFERENTIAL/PLATELET
Abs Immature Granulocytes: 0.1 10*3/uL — ABNORMAL HIGH (ref 0.00–0.07)
Basophils Absolute: 0.1 10*3/uL (ref 0.0–0.1)
Basophils Relative: 0 %
Eosinophils Absolute: 0.1 10*3/uL (ref 0.0–0.5)
Eosinophils Relative: 0 %
HCT: 40.6 % (ref 39.0–52.0)
Hemoglobin: 13.7 g/dL (ref 13.0–17.0)
Immature Granulocytes: 1 %
Lymphocytes Relative: 11 %
Lymphs Abs: 1.7 10*3/uL (ref 0.7–4.0)
MCH: 29.5 pg (ref 26.0–34.0)
MCHC: 33.7 g/dL (ref 30.0–36.0)
MCV: 87.3 fL (ref 80.0–100.0)
Monocytes Absolute: 0.8 10*3/uL (ref 0.1–1.0)
Monocytes Relative: 5 %
Neutro Abs: 13 10*3/uL — ABNORMAL HIGH (ref 1.7–7.7)
Neutrophils Relative %: 83 %
Platelets: 292 10*3/uL (ref 150–400)
RBC: 4.65 MIL/uL (ref 4.22–5.81)
RDW: 13.3 % (ref 11.5–15.5)
WBC: 15.6 10*3/uL — ABNORMAL HIGH (ref 4.0–10.5)
nRBC: 0 % (ref 0.0–0.2)

## 2023-04-04 LAB — COMPREHENSIVE METABOLIC PANEL
ALT: 54 U/L — ABNORMAL HIGH (ref 0–44)
AST: 30 U/L (ref 15–41)
Albumin: 4.2 g/dL (ref 3.5–5.0)
Alkaline Phosphatase: 90 U/L (ref 38–126)
Anion gap: 9 (ref 5–15)
BUN: 16 mg/dL (ref 6–20)
CO2: 26 mmol/L (ref 22–32)
Calcium: 9 mg/dL (ref 8.9–10.3)
Chloride: 102 mmol/L (ref 98–111)
Creatinine, Ser: 0.99 mg/dL (ref 0.61–1.24)
GFR, Estimated: >60 mL/min (ref >60)
Glucose, Bld: 151 mg/dL — ABNORMAL HIGH (ref 70–99)
Potassium: 3.5 mmol/L (ref 3.5–5.1)
Sodium: 137 mmol/L (ref 135–145)
Total Bilirubin: 0.7 mg/dL (ref 0.0–1.2)
Total Protein: 7.3 g/dL (ref 6.5–8.1)

## 2023-04-04 LAB — PROTIME-INR
INR: 2.7 — ABNORMAL HIGH (ref 0.8–1.2)
Prothrombin Time: 28.8 s — ABNORMAL HIGH (ref 11.4–15.2)

## 2023-04-04 MED ORDER — IOHEXOL 350 MG/ML SOLN
75.0000 mL | Freq: Once | INTRAVENOUS | Status: AC | PRN
  Administered 2023-04-04: 75 mL via INTRAVENOUS

## 2023-04-04 MED ORDER — METOPROLOL TARTRATE 25 MG PO TABS: 50.0000 mg | ORAL_TABLET | Freq: Two times a day (BID) | ORAL | 1 refills | Status: DC

## 2023-04-04 MED ORDER — FENTANYL CITRATE (PF) 100 MCG/2ML IJ SOLN
100.0000 ug | Freq: Once | INTRAMUSCULAR | Status: DC
Start: 2023-04-04 — End: 2023-04-04
  Filled 2023-04-04: qty 2

## 2023-04-04 MED ORDER — VALPROATE SODIUM 100 MG/ML IV SOLN
500.0000 mg | Freq: Once | INTRAVENOUS | Status: AC
  Administered 2023-04-04: 500 mg via INTRAVENOUS
  Filled 2023-04-04: qty 5

## 2023-04-04 MED ORDER — MAGNESIUM SULFATE 2 GM/50ML IV SOLN
2.0000 g | Freq: Once | INTRAVENOUS | Status: AC
  Administered 2023-04-04: 2 g via INTRAVENOUS
  Filled 2023-04-04: qty 50

## 2023-04-04 MED ORDER — HYDRALAZINE HCL 20 MG/ML IJ SOLN
5.0000 mg | Freq: Once | INTRAMUSCULAR | Status: AC
  Administered 2023-04-04: 5 mg via INTRAVENOUS
  Filled 2023-04-04: qty 1

## 2023-04-04 MED ORDER — METOCLOPRAMIDE HCL 5 MG/ML IJ SOLN
10.0000 mg | Freq: Once | INTRAMUSCULAR | Status: AC
  Administered 2023-04-04: 10 mg via INTRAVENOUS
  Filled 2023-04-04: qty 2

## 2023-04-04 MED ORDER — DIPHENHYDRAMINE HCL 50 MG/ML IJ SOLN
25.0000 mg | Freq: Once | INTRAMUSCULAR | Status: AC
  Administered 2023-04-04: 25 mg via INTRAVENOUS
  Filled 2023-04-04: qty 1

## 2023-04-04 MED ORDER — DEXAMETHASONE SODIUM PHOSPHATE 10 MG/ML IJ SOLN
10.0000 mg | Freq: Once | INTRAMUSCULAR | Status: AC
  Administered 2023-04-04: 10 mg via INTRAVENOUS
  Filled 2023-04-04: qty 1

## 2023-04-04 MED ORDER — KETOROLAC TROMETHAMINE 30 MG/ML IJ SOLN
15.0000 mg | Freq: Once | INTRAMUSCULAR | Status: AC
  Administered 2023-04-04: 15 mg via INTRAVENOUS
  Filled 2023-04-04: qty 1

## 2023-04-04 MED ORDER — LABETALOL HCL 5 MG/ML IV SOLN
2.5000 mg | Freq: Once | INTRAVENOUS | Status: AC
  Administered 2023-04-04: 2.5 mg via INTRAVENOUS
  Filled 2023-04-04: qty 4

## 2023-04-04 NOTE — ED Notes (Signed)
 ED Provider at bedside.

## 2023-04-04 NOTE — Telephone Encounter (Signed)
 Pt c/o BP issue: STAT if pt c/o blurred vision, one-sided weakness or slurred speech  1. What are your last 5 BP readings? 190-210  2. Are you having any other symptoms (ex. Dizziness, headache, blurred vision, passed out)? headache  3. What is your BP issue? Pt was at the [facility] ER last because of his BP

## 2023-04-04 NOTE — ED Triage Notes (Signed)
 Pt states he has had a headache that started tonight while at work; pt states he took his BP at work and it was 190/110  Pt c/o nausea and blurred vision; pt states he has been taking his BP meds as prescribed

## 2023-04-04 NOTE — ED Notes (Signed)
 Patient transported to CT

## 2023-04-04 NOTE — ED Provider Notes (Signed)
 [place] EMERGENCY DEPARTMENT AT [facility] Provider Note   CSN: [id number] Arrival date & time: 04/03/23  2350     History Chief Complaint  Patient presents with   Headache    Chase Lee is a 43 y.o. male.  43 year old male who presents ER today secondary to headache.  Patient states that they came on while he was get ready go home from work.  Had associated high blood pressure with it.  Presents here for further evaluation.  No neurologic changes but does have some blurry vision.  States that this is similar to what had happened what brought him to the ER previously.  Has a history of aortic valve repair but no other neurologic history.  No recent illnesses.   Headache      Home Medications Prior to Admission medications   Medication Sig Start Date End Date Taking? Authorizing Provider  ALPRAZolam  (XANAX ) 1 MG tablet TAKE 1 TABLET(1 MG) BY MOUTH THREE TIMES DAILY AS NEEDED FOR ANXIETY 01/15/23   Mozingo, Regina Nattalie, NP  amoxicillin-clavulanate (AUGMENTIN) 875-125 MG tablet Take 1 tablet by mouth 2 (two) times daily. 03/24/23   [provider]  Compression Bandages KIT 1 each by Does not apply route as needed (swelling). Adult compression stocking 15-20 adult size medium-large 03/05/23   Lasalle Pointer, NP  escitalopram  (LEXAPRO ) 10 MG tablet TAKE 1 TABLET(10 MG) BY MOUTH DAILY 11/14/22   Mozingo, Regina Nattalie, NP  furosemide  (LASIX ) 40 MG tablet Take 40 mg by mouth daily. Patient not taking: Reported on 08/17/2022 01/04/19   [provider]  lisinopril  (ZESTRIL ) 20 MG tablet Take 1 tablet (20 mg total) by mouth daily. 11/14/22   Mallipeddi, Vishnu P, MD  methylPREDNISolone  (MEDROL  DOSEPAK) 4 MG TBPK tablet Use as directed 03/30/23   Harris, Abigail, PA-C  metoprolol  tartrate (LOPRESSOR ) 25 MG tablet Take 2 tablets (50 mg total) by mouth 2 (two) times daily. 04/04/23   Anam Bobby, Reymundo Caulk, MD  omeprazole  (PRILOSEC) 40 MG capsule Take 1 capsule (40 mg  total) by mouth daily. 04/28/19   Avegno, Komlanvi S, FNP  promethazine-dextromethorphan (PROMETHAZINE-DM) 6.25-15 MG/5ML syrup Take 5 mLs by mouth every 8 (eight) hours. 03/24/23   [provider]  warfarin (COUMADIN ) 4 MG tablet Take 1 tablet (4 mg total) by mouth daily. 01/15/23   Mallipeddi, Kennyth Pean, MD      Allergies    Patient has no known allergies.    Review of Systems   Review of Systems  Neurological:  Positive for headaches.    Physical Exam Updated Vital Signs BP 137/78   Pulse 63   Temp (!) 97.4 F (36.3 C) (Oral)   Resp 12   Ht 5\' 7"  (1.702 m)   Wt 102.5 kg   SpO2 96%   BMI 35.40 kg/m  Physical Exam Vitals and nursing note reviewed.  Constitutional:      Appearance: He is well-developed.  HENT:     Head: Normocephalic and atraumatic.  Cardiovascular:     Rate and Rhythm: Normal rate.  Pulmonary:     Effort: Pulmonary effort is normal. No respiratory distress.  Abdominal:     General: There is no distension.  Musculoskeletal:        General: Normal range of motion.     Cervical back: Normal range of motion.  Neurological:     Mental Status: He is alert and oriented to person, place, and time. Mental status is at baseline.     GCS: GCS eye  subscore is 4. GCS verbal subscore is 5. GCS motor subscore is 6.     Cranial Nerves: No cranial nerve deficit.     Sensory: No sensory deficit.     Motor: No weakness.     Coordination: Romberg sign negative.     Deep Tendon Reflexes: Reflexes normal.  Psychiatric:        Mood and Affect: Mood normal.     ED Results / Procedures / Treatments   Labs (all labs ordered are listed, but only abnormal results are displayed) Labs Reviewed  CBC WITH DIFFERENTIAL/PLATELET - Abnormal; Notable for the following components:      Result Value   WBC 15.6 (*)    Neutro Abs 13.0 (*)    Abs Immature Granulocytes 0.10 (*)    All other components within normal limits  COMPREHENSIVE METABOLIC PANEL - Abnormal; Notable  for the following components:   Glucose, Bld 151 (*)    ALT 54 (*)    All other components within normal limits  PROTIME-INR - Abnormal; Notable for the following components:   Prothrombin Time 28.8 (*)    INR 2.7 (*)    All other components within normal limits    EKG None  Radiology CT ANGIO HEAD NECK W WO CM Result Date: 04/04/2023 CLINICAL DATA:  Headache and blurry vision EXAM: CT ANGIOGRAPHY HEAD AND NECK WITH AND WITHOUT CONTRAST TECHNIQUE: Multidetector CT imaging of the head and neck was performed using the standard protocol during bolus administration of intravenous contrast. Multiplanar CT image reconstructions and MIPs were obtained to evaluate the vascular anatomy. Carotid stenosis measurements (when applicable) are obtained utilizing NASCET criteria, using the distal internal carotid diameter as the denominator. RADIATION DOSE REDUCTION: This exam was performed according to the departmental dose-optimization program which includes automated exposure control, adjustment of the mA and/or kV according to patient size and/or use of iterative reconstruction technique. CONTRAST:  75mL OMNIPAQUE  IOHEXOL  350 MG/ML SOLN COMPARISON:  None Available. FINDINGS: CT HEAD FINDINGS Brain: No mass,hemorrhage or extra-axial collection. Normal appearance of the parenchyma and CSF spaces. Vascular: No hyperdense vessel or unexpected vascular calcification. Skull: The visualized skull base, calvarium and extracranial soft tissues are normal. Sinuses/Orbits: No fluid levels or advanced mucosal thickening of the visualized paranasal sinuses. No mastoid or middle ear effusion. Normal orbits. CTA NECK FINDINGS Skeleton: No acute abnormality or high grade bony spinal canal stenosis. Other neck: Normal pharynx, larynx and major salivary glands. No cervical lymphadenopathy. Unremarkable thyroid gland. Upper chest: No pneumothorax or pleural effusion. No nodules or masses. Aortic arch: There is no calcific  atherosclerosis of the aortic arch. There is a 5.7 cm ascending aortic aneurysm. RIGHT carotid system: No dissection, occlusion or aneurysm. Mild atherosclerotic calcification at the carotid bifurcation without hemodynamically significant stenosis. LEFT carotid system: No dissection, occlusion or aneurysm. Mild atherosclerotic calcification at the carotid bifurcation without hemodynamically significant stenosis. Vertebral arteries: Left dominant configuration. There is no dissection, occlusion or flow-limiting stenosis to the skull base (V1-V3 segments). CTA HEAD FINDINGS POSTERIOR CIRCULATION: Vertebral arteries are normal. No proximal occlusion of the anterior or inferior cerebellar arteries. Basilar artery is normal. Superior cerebellar arteries are normal. Posterior cerebral arteries are normal. ANTERIOR CIRCULATION: Intracranial internal carotid arteries are normal. Anterior cerebral arteries are normal. Middle cerebral arteries are normal. Venous sinuses: As permitted by contrast timing, patent. Anatomic variants: None Review of the MIP images confirms the above findings. IMPRESSION: 1. No emergent large vessel occlusion or hemodynamically significant stenosis of the head or  neck. 2. A 5.7 cm ascending aortic aneurysm. Cardiothoracic surgery consultation recommended due to increased risk of rupture for arch aneurysm ? 5.5 cm. This recommendation follows 2010 ACCF/AHA/AATS/ACR/ASA/SCA/SCAI/SIR/STS/SVM Guidelines for the Diagnosis and Management of Patients With Thoracic Aortic Disease. Circulation. 2010; 121: Z610-R604. Aortic aneurysm NOS (ICD10-I71.9) Electronically Signed   By: Juanetta Nordmann M.D.   On: 04/04/2023 02:45    Procedures Procedures    Medications Ordered in ED Medications  fentaNYL  (SUBLIMAZE ) injection 100 mcg (100 mcg Intravenous Not Given 04/04/23 0606)  metoCLOPramide  (REGLAN ) injection 10 mg (10 mg Intravenous Given 04/04/23 0127)  diphenhydrAMINE  (BENADRYL ) injection 25 mg (25 mg  Intravenous Given 04/04/23 0133)  dexamethasone  (DECADRON ) injection 10 mg (10 mg Intravenous Given 04/04/23 0133)  iohexol  (OMNIPAQUE ) 350 MG/ML injection 75 mL (75 mLs Intravenous Contrast Given 04/04/23 0213)  magnesium  sulfate IVPB 2 g 50 mL (0 g Intravenous Stopped 04/04/23 0424)  valproate (DEPACON ) 500 mg in dextrose  5 % 50 mL IVPB (0 mg Intravenous Stopped 04/04/23 0425)  labetalol  (NORMODYNE ) injection 2.5 mg (2.5 mg Intravenous Given 04/04/23 0405)  hydrALAZINE  (APRESOLINE ) injection 5 mg (5 mg Intravenous Given 04/04/23 0450)  ketorolac  (TORADOL ) 30 MG/ML injection 15 mg (15 mg Intravenous Given 04/04/23 5409)    ED Course/ Medical Decision Making/ A&P                                 Medical Decision Making Amount and/or Complexity of Data Reviewed Labs: ordered. Radiology: ordered. ECG/medicine tests: ordered.  Risk Prescription drug management.   Patient treated for headache and blood pressure.  CT angio done secondary to patient states this headache was very severe and found to have a thoracic ascending aneurysm.  Blood pressure is not very well-controlled right now so we will work on that along with his pain.  I did discuss this aneurysm with Dr. Deloise Ferries who recommended close outpatient follow-up since he is not having chest pain, shortness of breath or other associate symptoms with that right now.  Patient's blood pressure improved with his headache.  Difficult to tell which caused which however he will continue monitoring them at home with a goal of less than 140 especially since he has his known aneurysm.  Will double his metoprolol  if is persistently above 140.  Will follow-up with his cardiologist for further management of that and then also follow-up with his PCP for his headaches.  I do not think his headaches are related to his aneurysm.  He will call Dr. Raina Bunting office today to schedule an appointment.  Discussed signs and symptoms to be concerned and return to the ER.   Patient ambulated out of the part without difficulty.  Significantly improved.  Work note provided.   Final Clinical Impression(s) / ED Diagnoses Final diagnoses:  Acute nonintractable headache, unspecified headache type  Hypertension, unspecified type  Aneurysm of ascending aorta without rupture (HCC)    Rx / DC Orders ED Discharge Orders          Ordered    metoprolol  tartrate (LOPRESSOR ) 25 MG tablet  2 times daily       Note to Pharmacy: 12/05/2022-New   04/04/23 0553              Trayvion Embleton, Reymundo Caulk, MD 04/04/23 [phone]

## 2023-04-04 NOTE — Telephone Encounter (Signed)
 Patient was discharged from [facility] this morning. Had been experiencing bad headaches associated with elevated BP. Was unable to check while on the phone with me. He stated that his headache pain was at a 5 right now. Was giving Metoprolol  50 mg twice daily which was increased from the dose he was on also taking lisinopril . He wanted to make sure it was ok to take since his main medications is lisinopril . A CT Angio Neck was completed while there and would like to see what would he need to do to treat thoracic ascending aneurysm. Please Advise and would you like for him to be scheduled to be seen.

## 2023-04-05 ENCOUNTER — Telehealth: Payer: Self-pay | Admitting: Internal Medicine

## 2023-04-05 ENCOUNTER — Encounter: Payer: Self-pay | Admitting: Internal Medicine

## 2023-04-05 ENCOUNTER — Ambulatory Visit: Payer: No Typology Code available for payment source | Attending: Internal Medicine | Admitting: Internal Medicine

## 2023-04-05 ENCOUNTER — Telehealth: Payer: Self-pay

## 2023-04-05 VITALS — BP 116/68 | HR 69 | Ht 66.0 in | Wt 227.6 lb

## 2023-04-05 DIAGNOSIS — I10 Essential (primary) hypertension: Secondary | ICD-10-CM

## 2023-04-05 DIAGNOSIS — I712 Thoracic aortic aneurysm, without rupture, unspecified: Secondary | ICD-10-CM

## 2023-04-05 DIAGNOSIS — I7121 Aneurysm of the ascending aorta, without rupture: Secondary | ICD-10-CM | POA: Diagnosis not present

## 2023-04-05 HISTORY — DX: Essential (primary) hypertension: I10

## 2023-04-05 MED ORDER — BLOOD PRESSURE KIT: 1.0000 | PACK | Freq: Once | 0 refills | Status: AC

## 2023-04-05 MED ORDER — METOPROLOL TARTRATE 25 MG PO TABS: 25.0000 mg | ORAL_TABLET | Freq: Two times a day (BID) | ORAL | 5 refills | Status: DC

## 2023-04-05 MED ORDER — CARVEDILOL 12.5 MG PO TABS: 12.5000 mg | ORAL_TABLET | Freq: Two times a day (BID) | ORAL | 5 refills | Status: DC
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
## 2023-04-05 NOTE — Telephone Encounter (Signed)
Patient informed of additional testing prior to having surgery consult

## 2023-04-05 NOTE — Telephone Encounter (Signed)
STAT PRE-CERT for the following:  CT ANGIO Chest Aorta  (Has not been scheduled yet)

## 2023-04-05 NOTE — Patient Instructions (Addendum)
Medication Instructions:  Your physician has recommended you make the following change in your medication:  Start taking Metoprolol tartrate 25 mg twice daily Stop taking Coreg  Continue taking all other medications as prescribed  Labwork: None  Testing/Procedures: None  Follow-Up: Your physician recommends that you schedule a follow-up appointment in: 3 months  Any Other Special Instructions Will Be Listed Below (If Applicable).  Placed referral to Cardiothoracic Surgery  Thank you for choosing [facility]!     If you need a refill on your cardiac medications before your next appointment, please call your pharmacy.

## 2023-04-05 NOTE — Telephone Encounter (Signed)
Per Dr. Jenene Slicker Switch Metoprolol to Carvedilol 12.5 mg BID. BP has to be less than 120/80 mm Hg due to the incidental finding of ascending thoracic aneurysm 5.7 cm and to prevent rupture. He will need to make an appointment with CT surgery ASAP. Dr Cliffton Asters was contacted by ER a couple of days ago.  Please obtain medical records from [place] where he underwent aortic valve replacement. Please schedule appointment with me/APP in one week for BP management (schedule with me today or tomorrow). He will need to reach out to [place] if his BP>130/80 mm Hg. Provide ER precautions for chest pain, DOE, dizziness. He is at a high risk of aneurysm rupture if BP not controlled.   Patient verbalized understanding. Being seen today with Dr. Jenene Slicker

## 2023-04-06 ENCOUNTER — Ambulatory Visit (HOSPITAL_COMMUNITY): Admission: RE | Admit: 2023-04-06 | Payer: No Typology Code available for payment source | Source: Ambulatory Visit

## 2023-04-09 ENCOUNTER — Telehealth: Payer: Self-pay | Admitting: Internal Medicine

## 2023-04-09 NOTE — Telephone Encounter (Signed)
Reference to patient [id number]. He went by the [employer] last Friday with a new Insurance card. His procedure was re-scheduled for 04/12/2023. checking to see about the pre-cert on Mr. Vasiliou.

## 2023-04-09 NOTE — Telephone Encounter (Signed)
Pt requesting a cb to discuss additional testing??

## 2023-04-09 NOTE — Telephone Encounter (Signed)
Spoke with patient advised him of test on 01/23 CT Angio/Chest. He was aware. He has a consult appointment that is on 02/26 and they require him to have an Echo. Order was already placed in Dec when he saw [place]. Advised can get him scheduled. Sent to [facility] for an appointment.

## 2023-04-10 ENCOUNTER — Other Ambulatory Visit: Payer: Self-pay

## 2023-04-10 DIAGNOSIS — F410 Panic disorder [episodic paroxysmal anxiety] without agoraphobia: Secondary | ICD-10-CM

## 2023-04-10 DIAGNOSIS — F411 Generalized anxiety disorder: Secondary | ICD-10-CM

## 2023-04-10 DIAGNOSIS — I712 Thoracic aortic aneurysm, without rupture, unspecified: Secondary | ICD-10-CM

## 2023-04-10 MED ORDER — ALPRAZOLAM 1 MG PO TABS: ORAL_TABLET | ORAL | 0 refills | Status: DC

## 2023-04-10 NOTE — Telephone Encounter (Signed)
Called patient to verify pharmacy and it is [facility], not [facility], on [address]  Pended Alprazolam 1 mg TID

## 2023-04-12 ENCOUNTER — Ambulatory Visit (HOSPITAL_COMMUNITY): Payer: No Typology Code available for payment source

## 2023-04-17 ENCOUNTER — Ambulatory Visit: Payer: 59 | Attending: Nurse Practitioner

## 2023-04-17 DIAGNOSIS — Z952 Presence of prosthetic heart valve: Secondary | ICD-10-CM

## 2023-04-17 DIAGNOSIS — R002 Palpitations: Secondary | ICD-10-CM | POA: Diagnosis not present

## 2023-04-17 MED ORDER — PERFLUTREN LIPID MICROSPHERE
1.0000 mL | INTRAVENOUS | Status: AC | PRN
  Administered 2023-04-17: 5 mL via INTRAVENOUS

## 2023-04-17 NOTE — Progress Notes (Unsigned)
   Patient ID: Chase Lee, male    DOB: Aug 17, 1980, 43 y.o.   MRN: [id number]  HPI    Review of Systems    Physical Exam

## 2023-04-18 ENCOUNTER — Telehealth: Payer: Self-pay | Admitting: Internal Medicine

## 2023-04-18 LAB — ECHOCARDIOGRAM COMPLETE
AV Mean grad: 13.3 mm[Hg]
AV Peak grad: 26.3 mm[Hg]
Ao pk vel: 2.56 m/s
Area-P 1/2: 4.74 cm2
Calc EF: 73.9 %
S' Lateral: 4.7 cm
Single Plane A2C EF: 83.3 %
Single Plane A4C EF: 58.9 %

## 2023-04-18 NOTE — Telephone Encounter (Signed)
Patient is calling in reference to his echo from yesterday. He read the report online and would like more clarification on what "atrial valve is dialated" means. He also seen where his aneurysm wasn't noted, and would like to know if that means he doesn't have one. Patient is aware d/t time of call, he will receive a callback tomorrow.

## 2023-04-19 ENCOUNTER — Emergency Department (HOSPITAL_COMMUNITY)
Admission: EM | Admit: 2023-04-19 | Discharge: 2023-04-19 | Disposition: A | Discharge status: Home / Self Care | Payer: 59 | Source: Home / Self Care | Attending: Emergency Medicine | Admitting: Emergency Medicine

## 2023-04-19 ENCOUNTER — Emergency Department (HOSPITAL_COMMUNITY): Payer: 59

## 2023-04-19 ENCOUNTER — Encounter (HOSPITAL_COMMUNITY): Payer: Self-pay

## 2023-04-19 ENCOUNTER — Other Ambulatory Visit: Payer: Self-pay

## 2023-04-19 DIAGNOSIS — I10 Essential (primary) hypertension: Secondary | ICD-10-CM | POA: Insufficient documentation

## 2023-04-19 DIAGNOSIS — Z7901 Long term (current) use of anticoagulants: Secondary | ICD-10-CM | POA: Insufficient documentation

## 2023-04-19 DIAGNOSIS — I712 Thoracic aortic aneurysm, without rupture, unspecified: Secondary | ICD-10-CM | POA: Insufficient documentation

## 2023-04-19 DIAGNOSIS — Z79899 Other long term (current) drug therapy: Secondary | ICD-10-CM | POA: Insufficient documentation

## 2023-04-19 DIAGNOSIS — I7121 Aneurysm of the ascending aorta, without rupture: Secondary | ICD-10-CM | POA: Diagnosis not present

## 2023-04-19 LAB — COMPREHENSIVE METABOLIC PANEL
ALT: 53 U/L — ABNORMAL HIGH (ref 0–44)
AST: 31 U/L (ref 15–41)
Albumin: 4.2 g/dL (ref 3.5–5.0)
Alkaline Phosphatase: 72 U/L (ref 38–126)
Anion gap: 9 (ref 5–15)
BUN: 16 mg/dL (ref 6–20)
CO2: 24 mmol/L (ref 22–32)
Calcium: 8.6 mg/dL — ABNORMAL LOW (ref 8.9–10.3)
Chloride: 104 mmol/L (ref 98–111)
Creatinine, Ser: 1.05 mg/dL (ref 0.61–1.24)
GFR, Estimated: >60 mL/min (ref >60)
Glucose, Bld: 127 mg/dL — ABNORMAL HIGH (ref 70–99)
Potassium: 4 mmol/L (ref 3.5–5.1)
Sodium: 137 mmol/L (ref 135–145)
Total Bilirubin: 0.7 mg/dL (ref 0.0–1.2)
Total Protein: 7 g/dL (ref 6.5–8.1)

## 2023-04-19 LAB — CBC WITH DIFFERENTIAL/PLATELET
Abs Immature Granulocytes: 0.04 10*3/uL (ref 0.00–0.07)
Basophils Absolute: 0 10*3/uL (ref 0.0–0.1)
Basophils Relative: 0 %
Eosinophils Absolute: 0.1 10*3/uL (ref 0.0–0.5)
Eosinophils Relative: 2 %
HCT: 35.4 % — ABNORMAL LOW (ref 39.0–52.0)
Hemoglobin: 11.7 g/dL — ABNORMAL LOW (ref 13.0–17.0)
Immature Granulocytes: 1 %
Lymphocytes Relative: 22 %
Lymphs Abs: 1.5 10*3/uL (ref 0.7–4.0)
MCH: 29.3 pg (ref 26.0–34.0)
MCHC: 33.1 g/dL (ref 30.0–36.0)
MCV: 88.5 fL (ref 80.0–100.0)
Monocytes Absolute: 0.6 10*3/uL (ref 0.1–1.0)
Monocytes Relative: 9 %
Neutro Abs: 4.4 10*3/uL (ref 1.7–7.7)
Neutrophils Relative %: 66 %
Platelets: 163 10*3/uL (ref 150–400)
RBC: 4 MIL/uL — ABNORMAL LOW (ref 4.22–5.81)
RDW: 13.9 % (ref 11.5–15.5)
WBC: 6.6 10*3/uL (ref 4.0–10.5)
nRBC: 0 % (ref 0.0–0.2)

## 2023-04-19 LAB — PROTIME-INR
INR: 2.5 — ABNORMAL HIGH (ref 0.8–1.2)
Prothrombin Time: 27 s — ABNORMAL HIGH (ref 11.4–15.2)

## 2023-04-19 MED ORDER — HYDRALAZINE HCL 20 MG/ML IJ SOLN
5.0000 mg | Freq: Once | INTRAMUSCULAR | Status: AC
  Administered 2023-04-19: 5 mg via INTRAVENOUS
  Filled 2023-04-19: qty 1

## 2023-04-19 MED ORDER — CLONIDINE HCL 0.1 MG PO TABS
0.1000 mg | ORAL_TABLET | Freq: Once | ORAL | Status: AC
  Administered 2023-04-19: 0.1 mg via ORAL
  Filled 2023-04-19: qty 1

## 2023-04-19 MED ORDER — TRAMADOL HCL 50 MG PO TABS
50.0000 mg | ORAL_TABLET | Freq: Once | ORAL | Status: AC
  Administered 2023-04-19: 50 mg via ORAL
  Filled 2023-04-19: qty 1

## 2023-04-19 MED ORDER — METOPROLOL TARTRATE 50 MG PO TABS: 50.0000 mg | ORAL_TABLET | Freq: Once | ORAL | Status: DC

## 2023-04-19 MED ORDER — LORAZEPAM 2 MG/ML IJ SOLN
0.5000 mg | Freq: Once | INTRAMUSCULAR | Status: AC
  Administered 2023-04-19: 0.5 mg via INTRAVENOUS
  Filled 2023-04-19: qty 1

## 2023-04-19 MED ORDER — HYDROMORPHONE HCL 1 MG/ML IJ SOLN
0.5000 mg | Freq: Once | INTRAMUSCULAR | Status: AC
  Administered 2023-04-19: 0.5 mg via INTRAVENOUS
  Filled 2023-04-19: qty 0.5

## 2023-04-19 MED ORDER — IOHEXOL 350 MG/ML SOLN
100.0000 mL | Freq: Once | INTRAVENOUS | Status: AC | PRN
  Administered 2023-04-19: 100 mL via INTRAVENOUS

## 2023-04-19 NOTE — Discharge Instructions (Signed)
Make sure you see Dr. Lavinia Sharps tomorrow afternoon.  Call the office to be certain about the time

## 2023-04-19 NOTE — ED Provider Triage Note (Signed)
Emergency Medicine Provider Triage Evaluation Note  Chase Lee , a 43 y.o. male  was evaluated in triage.  Pt complains of sent by cardiology.  Review of Systems  Positive: DOE Negative: CP  Physical Exam  BP (!) 174/93   Pulse (!) 53   Temp 98.5 F (36.9 C) (Oral)   Resp 20   Ht 5\' 6"  (1.676 m)   Wt 103.2 kg   SpO2 95%   BMI 36.74 kg/m  Gen:   Awake, no distress   Resp:  Normal effort  MSK:   Moves extremities without difficulty  Other:    Medical Decision Making  Medically screening exam initiated at 2:07 PM.  Appropriate orders placed.  Chase Lee was informed that the remainder of the evaluation will be completed by another provider, this initial triage assessment does not replace that evaluation, and the importance of remaining in the ED until their evaluation is complete.  Patietn with h/o aortic valve replacement, on Coumadin, known ascending aortic aneurysm, concern for dilatation seen on Echo 2 days ago. Scheduled for CTA chest/aorta tomorrow, contacted by cardiology saying he needed to go today for study. CT surgery follow up scheduled for February - is felt he needs CT consultation today.    Elpidio Anis, PA-C 04/19/23 1409

## 2023-04-19 NOTE — ED Triage Notes (Signed)
Pt arrived via POV c/o chest pain, abdominal pain, and reports receiving a phone call following recent Echo and was advised to come to ER for CTA.

## 2023-04-19 NOTE — ED Provider Notes (Signed)
[place] EMERGENCY DEPARTMENT AT [facility] Provider Note   CSN: [id number] Arrival date & time: 04/19/23  1348     History  Chief Complaint  Patient presents with   Chest Pain    Chase Lee is a 43 y.o. male.  Patient has a history of hypertension and thoracic aneurysm.  It appears to be getting larger.  Patient was sent over here for evaluation of his thoracic aneurysm  The history is provided by the patient and medical records. No language interpreter was used.  Hypertension This is a recurrent problem. The current episode started more than 2 days ago. The problem occurs constantly. The problem has not changed since onset.Pertinent negatives include no chest pain. Nothing aggravates the symptoms. Nothing relieves the symptoms. He has tried nothing for the symptoms.       Home Medications Prior to Admission medications   Medication Sig Start Date End Date Taking? Authorizing Provider  ALPRAZolam (XANAX) 1 MG tablet TAKE 1 TABLET(1 MG) BY MOUTH THREE TIMES DAILY AS NEEDED FOR ANXIETY 04/10/23   Mozingo, Thereasa Solo, NP  Buprenorphine HCl-Naloxone HCl 4-1 MG FILM Take 4 mg of opioid by mouth every 8 (eight) hours. 04/02/23   [provider]  Compression Bandages KIT 1 each by Does not apply route as needed (swelling). Adult compression stocking 15-20 adult size medium-large 03/05/23   Sharlene Dory, NP  escitalopram (LEXAPRO) 10 MG tablet TAKE 1 TABLET(10 MG) BY MOUTH DAILY 11/14/22   Mozingo, Thereasa Solo, NP  furosemide (LASIX) 40 MG tablet Take 40 mg by mouth daily. 01/04/19   [provider]  lisinopril (ZESTRIL) 20 MG tablet Take 1 tablet (20 mg total) by mouth daily. 11/14/22   Mallipeddi, Vishnu P, MD  metoprolol tartrate (LOPRESSOR) 25 MG tablet Take 1 tablet (25 mg total) by mouth 2 (two) times daily. 04/05/23   Mallipeddi, Vishnu P, MD  omeprazole (PRILOSEC) 40 MG capsule Take 1 capsule (40 mg total) by mouth daily. 04/28/19   Avegno,  Zachery Dakins, FNP  promethazine-dextromethorphan (PROMETHAZINE-DM) 6.25-15 MG/5ML syrup Take 5 mLs by mouth every 8 (eight) hours. 03/24/23   [provider]  warfarin (COUMADIN) 4 MG tablet Take 1 tablet (4 mg total) by mouth daily. 01/15/23   Mallipeddi, Orion Modest, MD      Allergies    Patient has no known allergies.    Review of Systems   Review of Systems  HENT:  Negative for congestion, ear discharge and sinus pressure.   Cardiovascular:  Negative for chest pain.    Physical Exam Updated Vital Signs BP (!) 148/86 (BP Location: Left Arm)   Pulse (!) 40   Temp 98.4 F (36.9 C) (Oral)   Resp 12   Ht 5\' 6"  (1.676 m)   Wt 103.2 kg   SpO2 94%   BMI 36.74 kg/m  Physical Exam Vitals and nursing note reviewed.  Constitutional:      Appearance: He is well-developed.  HENT:     Head: Normocephalic.     Nose: Nose normal.  Eyes:     General: No scleral icterus.    Conjunctiva/sclera: Conjunctivae normal.  Neck:     Thyroid: No thyromegaly.  Cardiovascular:     Rate and Rhythm: Regular rhythm.     Heart sounds: No murmur heard.    No friction rub. No gallop.  Pulmonary:     Breath sounds: No stridor. No wheezing or rales.  Chest:     Chest wall: No tenderness.  Abdominal:     General: There is no distension.     Tenderness: There is no abdominal tenderness. There is no rebound.  Musculoskeletal:        General: Normal range of motion.     Cervical back: Neck supple.  Lymphadenopathy:     Cervical: No cervical adenopathy.  Skin:    Findings: No erythema or rash.  Neurological:     Mental Status: He is alert and oriented to person, place, and time.     Motor: No abnormal muscle tone.     Coordination: Coordination normal.  Psychiatric:        Behavior: Behavior normal.     ED Results / Procedures / Treatments   Labs (all labs ordered are listed, but only abnormal results are displayed) Labs Reviewed  CBC WITH DIFFERENTIAL/PLATELET - Abnormal; Notable for  the following components:      Result Value   RBC 4.00 (*)    Hemoglobin 11.7 (*)    HCT 35.4 (*)    All other components within normal limits  COMPREHENSIVE METABOLIC PANEL - Abnormal; Notable for the following components:   Glucose, Bld 127 (*)    Calcium 8.6 (*)    ALT 53 (*)    All other components within normal limits  PROTIME-INR - Abnormal; Notable for the following components:   Prothrombin Time 27.0 (*)    INR 2.5 (*)    All other components within normal limits    EKG None  Radiology CT Angio Chest Aorta W and/or Wo Contrast Result Date: 04/19/2023 CLINICAL DATA:  Ascending aorta dilation. History of aortic valve replacement. EXAM: CT ANGIOGRAPHY CHEST WITH CONTRAST TECHNIQUE: Multidetector CT imaging of the chest was performed using the standard protocol during bolus administration of intravenous contrast. Multiplanar CT image reconstructions and MIPs were obtained to evaluate the vascular anatomy. RADIATION DOSE REDUCTION: This exam was performed according to the departmental dose-optimization program which includes automated exposure control, adjustment of the mA and/or kV according to patient size and/or use of iterative reconstruction technique. CONTRAST:  OMNIPAQUE IOHEXOL 350 MG/ML SOLN COMPARISON:  None Available. FINDINGS: Cardiovascular: Preferential opacification of the thoracic aorta. There is marked dilation of ascending aorta measuring up to 7.4 cm, just distal to the coronary ostia. It gradually tapers in size and measures up to 4.3 cm in the mid arch level and measures 2.5 cm in the proximal descending thoracic aorta. Prosthetic aortic valve noted. No evidence of thoracic aortic dissection or penetrating atherosclerotic ulcer. There is mildly enlarged cardiac size. No pericardial effusion. Mediastinum/Nodes: Visualized thyroid gland appears grossly unremarkable. No solid / cystic mediastinal masses. The esophagus is nondistended precluding optimal assessment. No  axillary, mediastinal or hilar lymphadenopathy by size criteria. Lungs/Pleura: The central tracheo-bronchial tree is patent. Bilateral trace pleural effusions noted. There are patchy areas of smooth interlobular septal thickening especially in the bilateral lung bases. There are patchy areas of linear, plate-like atelectasis and/or scarring throughout bilateral lungs. No mass or consolidation. No pneumothorax. No suspicious lung nodules. Upper Abdomen: Visualized upper abdominal viscera within normal limits. Musculoskeletal: The visualized soft tissues of the chest wall are grossly unremarkable. No suspicious osseous lesions. IMPRESSION: 1. Marked dilation of ascending aorta measuring up to 7.4 cm. Emergent cardiothoracic consultation is recommended. 2. Mild cardiomegaly, trace bilateral pleural effusions and patchy areas of smooth interlobular septal thickening, favoring early congestive heart failure/pulmonary edema. 3. Multiple other nonacute observations, as described above. Aortic aneurysm NOS (ICD10-I71.9). Electronically Signed   By: Rhea Belton  Ramiro Harvest M.D.   On: 04/19/2023 16:18    Procedures Procedures    Medications Ordered in ED Medications  metoprolol tartrate (LOPRESSOR) tablet 50 mg (50 mg Oral Not Given 04/19/23 1734)  iohexol (OMNIPAQUE) 350 MG/ML injection 100 mL (100 mLs Intravenous Contrast Given 04/19/23 1524)  traMADol (ULTRAM) tablet 50 mg (50 mg Oral Given 04/19/23 1703)  cloNIDine (CATAPRES) tablet 0.1 mg (0.1 mg Oral Given 04/19/23 1915)  hydrALAZINE (APRESOLINE) injection 5 mg (5 mg Intravenous Given 04/19/23 2029)  LORazepam (ATIVAN) injection 0.5 mg (0.5 mg Intravenous Given 04/19/23 2027)  HYDROmorphone (DILAUDID) injection 0.5 mg (0.5 mg Intravenous Given 04/19/23 2048)    ED Course/ Medical Decision Making/ A&P      CRITICAL CARE Performed by: Bethann Berkshire Total critical care time: 45 minutes Critical care time was exclusive of separately billable procedures and  treating other patients. Critical care was necessary to treat or prevent imminent or life-threatening deterioration. Critical care was time spent personally by me on the following activities: development of treatment plan with patient and/or surrogate as well as nursing, discussions with consultants, evaluation of patient's response to treatment, examination of patient, obtaining history from patient or surrogate, ordering and performing treatments and interventions, ordering and review of laboratory studies, ordering and review of radiographic studies, pulse oximetry and re-evaluation of patient's condition.                                Medical Decision Making Amount and/or Complexity of Data Reviewed ECG/medicine tests: ordered.  Risk Prescription drug management.  This patient presents to the ED for concern of hypertension this involves an extensive number of treatment options, and is a complaint that carries with it a high risk of complications and morbidity.  The differential diagnosis includes poorly controlled blood pressure   Co morbidities that complicate the patient evaluation  Thoracic aneurysm   Additional history obtained:  Additional history obtained from cardiology External records from outside source obtained and reviewed including hospital records   Lab Tests:  I Ordered, and personally interpreted labs.  The pertinent results include: CBC and chemistries   Imaging Studies ordered:  I ordered imaging studies including CT angio of the chest I independently visualized and interpreted imaging which showed thoracic aneurysm I agree with the radiologist interpretation   Cardiac Monitoring: / EKG:  The patient was maintained on a cardiac monitor.  I personally viewed and interpreted the cardiac monitored which showed an underlying rhythm of: Normal sinus rhythm   Consultations Obtained:  I requested consultation with the thoracic surgery,  and discussed lab  and imaging findings as well as pertinent plan - they recommend: Follow-up tomorrow once blood pressure systolic is less than 150   Problem List / ED Course / Critical interventions / Medication management  Hypertension and thoracic aneurysm I ordered medication including clonidine, Lopressor, Apresoline Reevaluation of the patient after these medicines showed that the patient improved I have reviewed the patients home medicines and have made adjustments as needed   Social Determinants of Health:  None   Test / Admission - Considered:  None  Hypertension and thoracic aneurysm.  He will see the cardiothoracic surgeon tomorrow        Final Clinical Impression(s) / ED Diagnoses Final diagnoses:  Thoracic aortic aneurysm without rupture, unspecified part (HCC)    Rx / DC Orders ED Discharge Orders     None  Bethann Berkshire, MD 04/22/23 [phone]

## 2023-04-19 NOTE — Telephone Encounter (Signed)
Good morning. I read his echo yesterday. His aortic root and ascending aorta are severely dilated, 6 ish and 7.7cm respectively. He has CT chest/aorta scheduled tomorrow. He has appt with Dr. Laneta Simmers on 05/16/2023, too far out. I think he needs to go to ER now to get urgent CT surgery consultation. If CTS wants him to be transferred to [facility], it can be done too. Not sure where CTA chest/aorta is done, [place] or [place]?   Spoke with patient returning call from yesterday. He had reviewed his Echo results. Stated that he hasn't been feeling well at all. His BP readings from 12:08 am 178/89. He waiting a little longer BP 181/88 then 152/88.  Let him know his results of echo and that he need to go to ED recommended by Dr. Jenene Slicker and Sharlene Dory to have CT done and have appointment moved up immediately. Patient verbalized understanding, stated that he will be calling his wife to let her know.

## 2023-04-20 ENCOUNTER — Institutional Professional Consult (permissible substitution): Payer: 59 | Admitting: Surgery

## 2023-04-20 ENCOUNTER — Other Ambulatory Visit (HOSPITAL_COMMUNITY): Payer: Self-pay | Admitting: Surgery

## 2023-04-20 ENCOUNTER — Other Ambulatory Visit: Payer: Self-pay

## 2023-04-20 ENCOUNTER — Encounter (HOSPITAL_COMMUNITY): Payer: Self-pay

## 2023-04-20 ENCOUNTER — Inpatient Hospital Stay (HOSPITAL_COMMUNITY)
Admission: AD | Admit: 2023-04-20 | Discharge: 2023-05-04 | DRG: 220 | Disposition: A | Discharge status: Home / Self Care | Payer: 59 | Attending: Internal Medicine | Admitting: Internal Medicine

## 2023-04-20 ENCOUNTER — Other Ambulatory Visit: Payer: No Typology Code available for payment source

## 2023-04-20 ENCOUNTER — Encounter: Payer: Self-pay | Admitting: Surgery

## 2023-04-20 ENCOUNTER — Telehealth (HOSPITAL_COMMUNITY): Payer: Self-pay | Admitting: Emergency Medicine

## 2023-04-20 ENCOUNTER — Telehealth: Payer: Self-pay | Admitting: Internal Medicine

## 2023-04-20 VITALS — BP 199/107 | HR 50 | Resp 18 | Wt 227.0 lb

## 2023-04-20 DIAGNOSIS — I480 Paroxysmal atrial fibrillation: Secondary | ICD-10-CM | POA: Diagnosis present

## 2023-04-20 DIAGNOSIS — I440 Atrioventricular block, first degree: Secondary | ICD-10-CM | POA: Diagnosis not present

## 2023-04-20 DIAGNOSIS — R0789 Other chest pain: Secondary | ICD-10-CM | POA: Diagnosis present

## 2023-04-20 DIAGNOSIS — D649 Anemia, unspecified: Secondary | ICD-10-CM | POA: Insufficient documentation

## 2023-04-20 DIAGNOSIS — D62 Acute posthemorrhagic anemia: Secondary | ICD-10-CM | POA: Diagnosis not present

## 2023-04-20 DIAGNOSIS — I160 Hypertensive urgency: Secondary | ICD-10-CM | POA: Diagnosis present

## 2023-04-20 DIAGNOSIS — I169 Hypertensive crisis, unspecified: Secondary | ICD-10-CM | POA: Diagnosis not present

## 2023-04-20 DIAGNOSIS — D696 Thrombocytopenia, unspecified: Secondary | ICD-10-CM | POA: Diagnosis not present

## 2023-04-20 DIAGNOSIS — Z952 Presence of prosthetic heart valve: Secondary | ICD-10-CM | POA: Diagnosis not present

## 2023-04-20 DIAGNOSIS — F1120 Opioid dependence, uncomplicated: Secondary | ICD-10-CM | POA: Diagnosis present

## 2023-04-20 DIAGNOSIS — R7309 Other abnormal glucose: Secondary | ICD-10-CM | POA: Diagnosis present

## 2023-04-20 DIAGNOSIS — E66811 Obesity, class 1: Secondary | ICD-10-CM | POA: Diagnosis present

## 2023-04-20 DIAGNOSIS — I7121 Aneurysm of the ascending aorta, without rupture: Principal | ICD-10-CM | POA: Diagnosis present

## 2023-04-20 DIAGNOSIS — I447 Left bundle-branch block, unspecified: Secondary | ICD-10-CM | POA: Diagnosis present

## 2023-04-20 DIAGNOSIS — D72829 Elevated white blood cell count, unspecified: Secondary | ICD-10-CM | POA: Diagnosis not present

## 2023-04-20 DIAGNOSIS — Z6836 Body mass index (BMI) 36.0-36.9, adult: Secondary | ICD-10-CM

## 2023-04-20 DIAGNOSIS — G8929 Other chronic pain: Secondary | ICD-10-CM | POA: Diagnosis present

## 2023-04-20 DIAGNOSIS — I4891 Unspecified atrial fibrillation: Secondary | ICD-10-CM | POA: Diagnosis not present

## 2023-04-20 DIAGNOSIS — Z818 Family history of other mental and behavioral disorders: Secondary | ICD-10-CM

## 2023-04-20 DIAGNOSIS — I495 Sick sinus syndrome: Secondary | ICD-10-CM | POA: Diagnosis not present

## 2023-04-20 DIAGNOSIS — I310 Chronic adhesive pericarditis: Secondary | ICD-10-CM | POA: Diagnosis present

## 2023-04-20 DIAGNOSIS — K219 Gastro-esophageal reflux disease without esophagitis: Secondary | ICD-10-CM | POA: Diagnosis not present

## 2023-04-20 DIAGNOSIS — I2510 Atherosclerotic heart disease of native coronary artery without angina pectoris: Secondary | ICD-10-CM | POA: Diagnosis not present

## 2023-04-20 DIAGNOSIS — E871 Hypo-osmolality and hyponatremia: Secondary | ICD-10-CM | POA: Diagnosis present

## 2023-04-20 DIAGNOSIS — Z79899 Other long term (current) drug therapy: Secondary | ICD-10-CM

## 2023-04-20 DIAGNOSIS — F17210 Nicotine dependence, cigarettes, uncomplicated: Secondary | ICD-10-CM | POA: Diagnosis present

## 2023-04-20 DIAGNOSIS — I10 Essential (primary) hypertension: Secondary | ICD-10-CM | POA: Diagnosis not present

## 2023-04-20 DIAGNOSIS — I712 Thoracic aortic aneurysm, without rupture, unspecified: Secondary | ICD-10-CM | POA: Diagnosis not present

## 2023-04-20 DIAGNOSIS — F32A Depression, unspecified: Secondary | ICD-10-CM | POA: Diagnosis present

## 2023-04-20 DIAGNOSIS — Q2381 Bicuspid aortic valve: Secondary | ICD-10-CM | POA: Diagnosis not present

## 2023-04-20 DIAGNOSIS — F419 Anxiety disorder, unspecified: Secondary | ICD-10-CM | POA: Diagnosis present

## 2023-04-20 DIAGNOSIS — E876 Hypokalemia: Secondary | ICD-10-CM | POA: Diagnosis present

## 2023-04-20 DIAGNOSIS — I77810 Thoracic aortic ectasia: Principal | ICD-10-CM | POA: Diagnosis present

## 2023-04-20 DIAGNOSIS — Z7901 Long term (current) use of anticoagulants: Secondary | ICD-10-CM

## 2023-04-20 DIAGNOSIS — D638 Anemia in other chronic diseases classified elsewhere: Secondary | ICD-10-CM | POA: Diagnosis present

## 2023-04-20 DIAGNOSIS — I7101 Dissection of ascending aorta: Secondary | ICD-10-CM | POA: Diagnosis present

## 2023-04-20 LAB — COMPREHENSIVE METABOLIC PANEL
ALT: 49 U/L — ABNORMAL HIGH (ref 0–44)
AST: 30 U/L (ref 15–41)
Albumin: 3.9 g/dL (ref 3.5–5.0)
Alkaline Phosphatase: 75 U/L (ref 38–126)
Anion gap: 10 (ref 5–15)
BUN: 11 mg/dL (ref 6–20)
CO2: 25 mmol/L (ref 22–32)
Calcium: 8.8 mg/dL — ABNORMAL LOW (ref 8.9–10.3)
Chloride: 106 mmol/L (ref 98–111)
Creatinine, Ser: 1.13 mg/dL (ref 0.61–1.24)
GFR, Estimated: >60 mL/min (ref >60)
Glucose, Bld: 76 mg/dL (ref 70–99)
Potassium: 3.9 mmol/L (ref 3.5–5.1)
Sodium: 141 mmol/L (ref 135–145)
Total Bilirubin: 0.9 mg/dL (ref 0.0–1.2)
Total Protein: 6.4 g/dL — ABNORMAL LOW (ref 6.5–8.1)

## 2023-04-20 LAB — CBC
HCT: 35 % — ABNORMAL LOW (ref 39.0–52.0)
Hemoglobin: 11.8 g/dL — ABNORMAL LOW (ref 13.0–17.0)
MCH: 29.2 pg (ref 26.0–34.0)
MCHC: 33.7 g/dL (ref 30.0–36.0)
MCV: 86.6 fL (ref 80.0–100.0)
Platelets: 170 10*3/uL (ref 150–400)
RBC: 4.04 MIL/uL — ABNORMAL LOW (ref 4.22–5.81)
RDW: 14 % (ref 11.5–15.5)
WBC: 6.5 10*3/uL (ref 4.0–10.5)
nRBC: 0 % (ref 0.0–0.2)

## 2023-04-20 LAB — PROTIME-INR
INR: 2 — ABNORMAL HIGH (ref 0.8–1.2)
Prothrombin Time: 23 s — ABNORMAL HIGH (ref 11.4–15.2)

## 2023-04-20 LAB — MAGNESIUM: Magnesium: 2 mg/dL (ref 1.7–2.4)

## 2023-04-20 LAB — LACTIC ACID, PLASMA: Lactic Acid, Venous: 1.1 mmol/L (ref 0.5–1.9)

## 2023-04-20 LAB — HIV ANTIBODY (ROUTINE TESTING W REFLEX): HIV Screen 4th Generation wRfx: NONREACTIVE

## 2023-04-20 LAB — TYPE AND SCREEN
ABO/RH(D): O POS
Antibody Screen: NEGATIVE

## 2023-04-20 LAB — PHOSPHORUS: Phosphorus: 3.9 mg/dL (ref 2.5–4.6)

## 2023-04-20 LAB — ABO/RH: ABO/RH(D): O POS

## 2023-04-20 MED ORDER — DOCUSATE SODIUM 100 MG PO CAPS: 100.0000 mg | ORAL_CAPSULE | Freq: Two times a day (BID) | ORAL | Status: DC | PRN

## 2023-04-20 MED ORDER — CLEVIDIPINE BUTYRATE 0.5 MG/ML IV EMUL
0.0000 mg/h | INTRAVENOUS | Status: DC
  Administered 2023-04-20: 2 mg/h via INTRAVENOUS
  Administered 2023-04-21: 8 mg/h via INTRAVENOUS
  Administered 2023-04-21: 5 mg/h via INTRAVENOUS
  Administered 2023-04-21: 10 mg/h via INTRAVENOUS
  Filled 2023-04-20 (×4): qty 100

## 2023-04-20 MED ORDER — SODIUM CHLORIDE 0.9% FLUSH
10.0000 mL | Freq: Two times a day (BID) | INTRAVENOUS | Status: DC
  Administered 2023-04-20 – 2023-04-21 (×3): 10 mL
  Administered 2023-04-22: 40 mL

## 2023-04-20 MED ORDER — POLYETHYLENE GLYCOL 3350 17 G PO PACK: 17.0000 g | PACK | Freq: Every day | ORAL | Status: DC | PRN

## 2023-04-20 MED ORDER — ESCITALOPRAM OXALATE 10 MG PO TABS: 20.0000 mg | ORAL_TABLET | Freq: Every day | ORAL | Status: DC

## 2023-04-20 MED ORDER — ALPRAZOLAM 0.5 MG PO TABS
1.0000 mg | ORAL_TABLET | Freq: Four times a day (QID) | ORAL | Status: DC | PRN
  Administered 2023-04-20 – 2023-04-25 (×17): 1 mg via ORAL
  Filled 2023-04-20 (×17): qty 2

## 2023-04-20 MED ORDER — SODIUM CHLORIDE 0.9% FLUSH
10.0000 mL | INTRAVENOUS | Status: DC | PRN
Start: 2023-04-20 — End: 2023-04-22

## 2023-04-20 MED ORDER — ESCITALOPRAM OXALATE 10 MG PO TABS
10.0000 mg | ORAL_TABLET | Freq: Every day | ORAL | Status: DC
  Administered 2023-04-20 – 2023-05-03 (×14): 10 mg via ORAL
  Filled 2023-04-20 (×14): qty 1

## 2023-04-20 MED ORDER — OXYCODONE-ACETAMINOPHEN 5-325 MG PO TABS
1.0000 | ORAL_TABLET | Freq: Four times a day (QID) | ORAL | Status: DC | PRN
  Administered 2023-04-20 – 2023-04-25 (×21): 1 via ORAL
  Filled 2023-04-20 (×21): qty 1

## 2023-04-20 MED ORDER — PANTOPRAZOLE SODIUM 40 MG PO TBEC
40.0000 mg | DELAYED_RELEASE_TABLET | Freq: Every day | ORAL | Status: DC
  Administered 2023-04-20 – 2023-04-25 (×6): 40 mg via ORAL
  Filled 2023-04-20 (×5): qty 1

## 2023-04-20 MED ORDER — CHLORHEXIDINE GLUCONATE CLOTH 2 % EX PADS
6.0000 | MEDICATED_PAD | Freq: Every day | CUTANEOUS | Status: DC
  Administered 2023-04-20 – 2023-04-25 (×6): 6 via TOPICAL

## 2023-04-20 NOTE — Telephone Encounter (Signed)
Patient called stated that he wanted to let us know he has an appointment with CT surgery at 2:30 today. I was advised of his BP reading. He stated that he was starting to feel better some and was going to eat. I advised if possible not to drive when he goes to the appointment since he is also having some dizzy spells. He verbalized understanding also if he doesn't feel better needed to go to ER.

## 2023-04-20 NOTE — Telephone Encounter (Signed)
Reaching out to patient to offer assistance regarding upcoming cardiac imaging study; pt verbalizes understanding of appt date/time, parking situation and where to check in, pre-test NPO status and medications ordered, and verified current allergies; name and call back number provided for further questions should they arise Rockwell Alexandria RN Navigator Cardiac Imaging [facility] [phone] office [phone] cell  Mychart sent per pt request since he was sitting in the dr office unable to take notes

## 2023-04-20 NOTE — H&P (Signed)
NAME:  Chase Lee, MRN:  [id number], DOB:  [birth year], LOS: 0 ADMISSION DATE:  04/20/2023, CONSULTATION DATE:  04/19/22 REFERRING MD:  Laneta Simmers, CHIEF COMPLAINT:  HA   History of Present Illness:  43 year old man w/ hx of anxiety, chronic pain, HTN, and aortic valve replacement x 2 on coumadin presenting with increasing chest pain, HA, and DOE.  Workup has revealed an extremely large thoracic aneurysm as well as significant hypertension.  He is going to be direct admitted for blood pressure control, coumadin washout and eventual bentall.  Pertinent  Medical History  HTN Bicuspid aortic valve Anxiety Chronic pain previously on suboxone  Significant Hospital Events: Including procedures, antibiotic start and stop dates in addition to other pertinent events   1/31 admit  Interim History / Subjective:  Admit  Objective   HR 53, BP 159/90, sats 99% RA, RR 15  Examination: General: no distress HENT: MMM, trachea midline Lungs: clear, no wheezing Cardiovascular: irregular, +murmur Abdomen: soft, hypoactive BS Extremities: no edema Neuro: moves all 4 ext to command Skin: sternotomy looks okay  Yesterday's BMP okay, had mild anemia on CBC  Resolved Hospital Problem list   N/A  Assessment & Plan:  Symptomatic ascending aortic aneurysm Intermittent Afib/RVR Anticoagulated Hypertensive crisis Chronic pain/anxiety GERD  - Cleviprex and pain control for SBP < 120 - Place PICC - Check lytes and replete PRN; a little too brady to empirically start amio unless recurrent runs - PTA xanax ordered as well as percocet - Monitor clinically for any s/s of dissection - Plan for coumadin washout, gated CTA to look at coronaries Monday (LHC contraindicated with such a large aneurysm) then tentative bentall late next week - Plan discussed with patient and TCTS  Best Practice (right click and "Reselect all SmartList Selections" daily)   Diet/type: Regular consistency (see orders) DVT  prophylaxis other start heparin gtt once INR < 2 Pressure ulcer(s): N/A GI prophylaxis: PPI Lines: Central line Foley:  N/A Code Status:  full code Last date of multidisciplinary goals of care discussion [patient updated]  Labs   CBC: Recent Labs  Lab 04/19/23 1411  WBC 6.6  NEUTROABS 4.4  HGB 11.7*  HCT 35.4*  MCV 88.5  PLT 163    Basic Metabolic Panel: Recent Labs  Lab 04/19/23 1411  NA 137  K 4.0  CL 104  CO2 24  GLUCOSE 127*  BUN 16  CREATININE 1.05  CALCIUM 8.6*   GFR: Estimated Creatinine Clearance: 99.9 mL/min (by C-G formula based on SCr of 1.05 mg/dL). Recent Labs  Lab 04/19/23 1411  WBC 6.6    Liver Function Tests: Recent Labs  Lab 04/19/23 1411  AST 31  ALT 53*  ALKPHOS 72  BILITOT 0.7  PROT 7.0  ALBUMIN 4.2   No results for input(s): "LIPASE", "AMYLASE" in the last 168 hours. No results for input(s): "AMMONIA" in the last 168 hours.  ABG No results found for: "PHART", "PCO2ART", "PO2ART", "HCO3", "TCO2", "ACIDBASEDEF", "O2SAT"   Coagulation Profile: Recent Labs  Lab 04/19/23 1411  INR 2.5*    Cardiac Enzymes: No results for input(s): "CKTOTAL", "CKMB", "CKMBINDEX", "TROPONINI" in the last 168 hours.  HbA1C: No results found for: "HGBA1C"  CBG: No results for input(s): "GLUCAP" in the last 168 hours.  Review of Systems:    Positive Symptoms in bold:  Constitutional fevers, chills, weight loss, fatigue, anorexia, malaise  Eyes decreased vision, double vision, eye irritation  Ears, Nose, Mouth, Throat sore throat, trouble swallowing, sinus congestion  Cardiovascular chest pain, paroxysmal nocturnal dyspnea, lower ext edema, palpitations   Respiratory SOB, cough, DOE, hemoptysis, wheezing  Gastrointestinal nausea, vomiting, diarrhea  Genitourinary burning with urination, trouble urinating  Musculoskeletal joint aches, joint swelling, back pain  Integumentary  rashes, skin lesions  Neurological focal weakness, focal  numbness, trouble speaking, headaches  Psychiatric depression, anxiety, confusion  Endocrine polyuria, polydipsia, cold intolerance, heat intolerance  Hematologic abnormal bruising, abnormal bleeding, unexplained nose bleeds  Allergic/Immunologic recurrent infections, hives, swollen lymph nodes     Past Medical History:  He,  has a past medical history of Anxiety, History of artificial heart valve (2008), and HTN (hypertension) (04/05/2023).   Surgical History:   Past Surgical History:  Procedure Laterality Date   CARDIAC SURGERY       Social History:   reports that he has been smoking cigarettes. He has never been exposed to tobacco smoke. He has never used smokeless tobacco. He reports that he does not drink alcohol and does not use drugs.   Family History:  His family history includes Cancer in his father and mother; Depression in his mother.   Allergies No Known Allergies   Home Medications  Prior to Admission medications   Medication Sig Start Date End Date Taking? Authorizing Provider  ALPRAZolam (XANAX) 1 MG tablet TAKE 1 TABLET(1 MG) BY MOUTH THREE TIMES DAILY AS NEEDED FOR ANXIETY 04/10/23   Mozingo, Thereasa Solo, NP  Buprenorphine HCl-Naloxone HCl 4-1 MG FILM Take 4 mg of opioid by mouth every 8 (eight) hours. 04/02/23   [provider]  Compression Bandages KIT 1 each by Does not apply route as needed (swelling). Adult compression stocking 15-20 adult size medium-large 03/05/23   Sharlene Dory, NP  escitalopram (LEXAPRO) 10 MG tablet TAKE 1 TABLET(10 MG) BY MOUTH DAILY 11/14/22   Mozingo, Thereasa Solo, NP  furosemide (LASIX) 40 MG tablet Take 40 mg by mouth daily. 01/04/19   [provider]  lisinopril (ZESTRIL) 20 MG tablet Take 1 tablet (20 mg total) by mouth daily. 11/14/22   Mallipeddi, Vishnu P, MD  metoprolol tartrate (LOPRESSOR) 25 MG tablet Take 1 tablet (25 mg total) by mouth 2 (two) times daily. 04/05/23   Mallipeddi, Vishnu P, MD   omeprazole (PRILOSEC) 40 MG capsule Take 1 capsule (40 mg total) by mouth daily. 04/28/19   Avegno, Zachery Dakins, FNP  promethazine-dextromethorphan (PROMETHAZINE-DM) 6.25-15 MG/5ML syrup Take 5 mLs by mouth every 8 (eight) hours. 03/24/23   [provider]  warfarin (COUMADIN) 4 MG tablet Take 1 tablet (4 mg total) by mouth daily. 01/15/23   Mallipeddi, Orion Modest, MD     Critical care time: 34 mins

## 2023-04-20 NOTE — Progress Notes (Signed)
Cardiothoracic Surgery Consultation  PCP is [facility] Referring Provider is Waldon Reining, MD  in ER. Primary Cardiologist is Dr. Jenene Slicker Chief Complaint  Patient presents with   Thoracic Aortic Aneurysm    CTA 12/31, ECHO1/28    HPI:  The patient is a 43 year old gentleman with history of anxiety, hypertension and bicuspid aortic valve disease who underwent an aortic valvuloplasty at age 45 through a sternotomy incision.  He subsequently underwent aortic valve replacement at age 43 with a mechanical valve.  He has been maintained on Coumadin since.  These were both done in [place] and the details are unavailable.  He was initially seen by Dr. Brett Canales for cardiology consultation in May 2024.  At that time he was having some shortness of breath as well as bilateral lower extremity edema and some palpitations.  He was started on Lopressor 25 mg twice daily.  An echocardiogram was ordered at that time but not completed.  He was seen back by the nurse practitioner on 03/05/2023 and was felt to be doing well overall.  An echo was ordered again and was done on 04/17/2023 showing severe dilation of the ascending aorta at 7.7 cm with the root measuring 6.2 cm.  The mechanical aortic valve was functioning appropriately with a mean gradient of 13.3 mmHg.  Left ventricular systolic function was normal.  He had a CT scan of the neck on 04/04/2023 due to some headache and blurry vision.  This showed the distal part of the ascending aorta which was measured at 5.7 cm.  No other abnormality was seen.  He was seen back again by Dr. Jenene Slicker on 04/05/2023 and was feeling fairly well overall with a blood pressure at that time to 116/68.  An appointment was scheduled for outpatient cardiothoracic surgery consultation.  He was seen in the emergency room yesterday complaining of chest pain and headache and had a CTA of the chest showing a 7.4 cm ascending aortic aneurysm.  The ER physician  was concerned because the previous CT scan of the neck was read as a 5.7 cm aneurysm but this was only showing the distal ascending aorta accounting for the discrepancy.  His BP was 148/86 and this was treated with clonidine and hydralazine.  Our office was called to get him an appointment immediately which we did today.  The patient tells me that he continues to have some headache.  He is not currently having any chest pain or shortness of breath but was yesterday.  He still has some lower extremity edema.  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  He is married and lives with his wife.  He has a history of smoking 1/2 packs of cigarettes per day but quit about 3 months ago.  He was recently put on Suboxone due to some right sciatic pain but says that it has not helped. Past Medical History:  Diagnosis Date   Anxiety    History of artificial heart valve 2008   surgery at 25   HTN (hypertension) 04/05/2023    Past Surgical History:  Procedure Laterality Date   CARDIAC SURGERY      Family History  Problem Relation Age of Onset   Depression Mother    Cancer Mother    Cancer Father     Social History Social History   Tobacco Use   Smoking status: Some Days    Current packs/day: 0.00    Types: Cigarettes  Last attempt to quit: 08/10/2019    Years since quitting: 3.6    Passive exposure: Never   Smokeless tobacco: Never   Tobacco comments:    Tries to chew nicotine gum at work - smoking 3 ciggs per day   Vaping Use   Vaping status: Never Used  Substance Use Topics   Alcohol use: Never   Drug use: Never    Current Outpatient Medications  Medication Sig Dispense Refill   ALPRAZolam (XANAX) 1 MG tablet TAKE 1 TABLET(1 MG) BY MOUTH THREE TIMES DAILY AS NEEDED FOR ANXIETY 90 tablet 0   Buprenorphine HCl-Naloxone HCl 4-1 MG FILM Take 4 mg of opioid by mouth every 8 (eight) hours.     Compression Bandages KIT 1 each by Does not apply route as needed  (swelling). Adult compression stocking 15-20 adult size medium-large 1 kit 0   escitalopram (LEXAPRO) 10 MG tablet TAKE 1 TABLET(10 MG) BY MOUTH DAILY 30 tablet 5   furosemide (LASIX) 40 MG tablet Take 40 mg by mouth daily.     lisinopril (ZESTRIL) 20 MG tablet Take 1 tablet (20 mg total) by mouth daily. 90 tablet 1   metoprolol tartrate (LOPRESSOR) 25 MG tablet Take 1 tablet (25 mg total) by mouth 2 (two) times daily. 30 tablet 5   omeprazole (PRILOSEC) 40 MG capsule Take 1 capsule (40 mg total) by mouth daily. 60 capsule 0   promethazine-dextromethorphan (PROMETHAZINE-DM) 6.25-15 MG/5ML syrup Take 5 mLs by mouth every 8 (eight) hours.     warfarin (COUMADIN) 4 MG tablet Take 1 tablet (4 mg total) by mouth daily. 35 tablet 5   No current facility-administered medications for this visit.    No Known Allergies  Review of Systems  Constitutional:  Negative for activity change, chills, fatigue and fever.  HENT: Negative.  Negative for dental problem.   Eyes: Negative.   Respiratory:  Positive for chest tightness and shortness of breath.   Cardiovascular:  Positive for chest pain, palpitations and leg swelling.  Gastrointestinal: Negative.   Endocrine: Negative.   Genitourinary: Negative.   Musculoskeletal:  Positive for back pain.  Skin: Negative.   Allergic/Immunologic: Negative.   Neurological:  Positive for dizziness. Negative for syncope.       Sciatic pain down right leg  Hematological: Negative.   Psychiatric/Behavioral:  The patient is nervous/anxious.     BP (!) 199/107 (BP Location: Left Arm) Repeat 10 minutes later 199/107 Pulse (!) 50   Resp 18   Wt 227 lb (103 kg)   SpO2 97% Comment: RA  BMI 36.64 kg/m  Physical Exam Constitutional:      Appearance: Normal appearance. He is obese.  HENT:     Head: Normocephalic and atraumatic.     Mouth/Throat:     Mouth: Mucous membranes are moist.     Pharynx: Oropharynx is clear.  Eyes:     Extraocular Movements:  Extraocular movements intact.     Conjunctiva/sclera: Conjunctivae normal.     Pupils: Pupils are equal, round, and reactive to light.  Neck:     Vascular: No carotid bruit.  Cardiovascular:     Rate and Rhythm: Normal rate and regular rhythm.     Pulses: Normal pulses.     Heart sounds: Murmur heard.     Comments: Soft systolic flow murmur along the right lower sternal border. Pulmonary:     Effort: Pulmonary effort is normal.     Breath sounds: Normal breath sounds.  Abdominal:  General: There is no distension.     Tenderness: There is no abdominal tenderness.  Musculoskeletal:     Cervical back: Normal range of motion.     Right lower leg: Edema present.     Left lower leg: Edema present.  Skin:    General: Skin is warm and dry.  Neurological:     General: No focal deficit present.     Mental Status: He is alert and oriented to person, place, and time.  Psychiatric:        Mood and Affect: Mood normal.        Behavior: Behavior normal.      Diagnostic Tests:  Narrative & Impression  CLINICAL DATA:  Ascending aorta dilation. History of aortic valve replacement.   EXAM: CT ANGIOGRAPHY CHEST WITH CONTRAST   TECHNIQUE: Multidetector CT imaging of the chest was performed using the standard protocol during bolus administration of intravenous contrast. Multiplanar CT image reconstructions and MIPs were obtained to evaluate the vascular anatomy.   RADIATION DOSE REDUCTION: This exam was performed according to the departmental dose-optimization program which includes automated exposure control, adjustment of the mA and/or kV according to patient size and/or use of iterative reconstruction technique.   CONTRAST:  OMNIPAQUE IOHEXOL 350 MG/ML SOLN   COMPARISON:  None Available.   FINDINGS: Cardiovascular: Preferential opacification of the thoracic aorta. There is marked dilation of ascending aorta measuring up to 7.4 cm, just distal to the coronary ostia. It  gradually tapers in size and measures up to 4.3 cm in the mid arch level and measures 2.5 cm in the proximal descending thoracic aorta. Prosthetic aortic valve noted. No evidence of thoracic aortic dissection or penetrating atherosclerotic ulcer. There is mildly enlarged cardiac size. No pericardial effusion.   Mediastinum/Nodes: Visualized thyroid gland appears grossly unremarkable. No solid / cystic mediastinal masses. The esophagus is nondistended precluding optimal assessment. No axillary, mediastinal or hilar lymphadenopathy by size criteria.   Lungs/Pleura: The central tracheo-bronchial tree is patent. Bilateral trace pleural effusions noted. There are patchy areas of smooth interlobular septal thickening especially in the bilateral lung bases. There are patchy areas of linear, plate-like atelectasis and/or scarring throughout bilateral lungs. No mass or consolidation. No pneumothorax. No suspicious lung nodules.   Upper Abdomen: Visualized upper abdominal viscera within normal limits.   Musculoskeletal: The visualized soft tissues of the chest wall are grossly unremarkable. No suspicious osseous lesions.   IMPRESSION: 1. Marked dilation of ascending aorta measuring up to 7.4 cm. Emergent cardiothoracic consultation is recommended. 2. Mild cardiomegaly, trace bilateral pleural effusions and patchy areas of smooth interlobular septal thickening, favoring early congestive heart failure/pulmonary edema. 3. Multiple other nonacute observations, as described above.   Aortic aneurysm NOS (ICD10-I71.9).     Electronically Signed   By: Jules Schick M.D.   On: 04/19/2023 16:18      ECHOCARDIOGRAM REPORT       Patient Name:   Chase Lee Date of Exam: 04/17/2023  Medical Rec #:  [id number]    Height:       66.0 in  Accession #:    [id number]   Weight:       227.6 lb  Date of Birth:  11-04-1980     BSA:          2.113 m  Patient Age:    43 years     BP:           138/96  mmHg  Patient Gender: M  HR:           47 bpm.  Exam Location:  [place]   Procedure: 2D Echo, Cardiac Doppler, Color Doppler and Intracardiac             Opacification Agent   Indications:     R00.2 Palpitations S/P mechanical aortic valve  replacement at                   age 1    History:         Patient has no prior history of Echocardiogram  examinations.                  Aortic Valve Disease, Signs/Symptoms:Palpitations; Risk                   Factors:Current Smoker and Hypertension. Ascending aortic                   aneurysm.                   Aortic Valve: unknown mechanical valve is present in the  aortic                  position. Procedure Date: At age 81 years old.    Sonographer:     Dominica Severin RCS, RVS  Referring Phys:  [id number] ELIZABETH PECK  Diagnosing Phys: Hart [place] Priya Mallipeddi     Sonographer Comments: Technically difficult study due to poor echo  windows.  IMPRESSIONS     1. No evidence of LV thrombus. Definity administered. Left ventricular  ejection fraction, by estimation, is 60 to 65%. The left ventricle has  normal function. The left ventricle has no regional wall motion  abnormalities. Left ventricular diastolic  parameters are indeterminate. Elevated left ventricular end-diastolic  pressure.   2. Right ventricular systolic function is low normal. The right  ventricular size is mildly enlarged. There is mildly elevated pulmonary  artery systolic pressure. The estimated right ventricular systolic  pressure is 42.9 mmHg.   3. Left atrial size was severely dilated.   4. The mitral valve is normal in structure. No evidence of mitral valve  regurgitation. No evidence of mitral stenosis.   5. The aortic valve has been repaired/replaced. Aortic valve  regurgitation is mild. No aortic stenosis is present. There is a unknown  mechanical valve present in the aortic position. Procedure Date: At age 51  years old.   6. Aortic dilatation noted.  There is severe dilatation of the aortic  root, measuring 62 mm. There is severe dilatation of the ascending aorta,  measuring 77 mm.   7. The inferior vena cava is dilated in size with <50% respiratory  variability, suggesting right atrial pressure of 15 mmHg.   Comparison(s): No prior Echocardiogram. Severe aorta dilatation.   FINDINGS   Left Ventricle: No evidence of LV thrombus. Definity administered. Left  ventricular ejection fraction, by estimation, is 60 to 65%. The left  ventricle has normal function. The left ventricle has no regional wall  motion abnormalities. Definity contrast  agent was given IV to delineate the left ventricular endocardial borders.  The left ventricular internal cavity size was normal in size. There is no  left ventricular hypertrophy. Left ventricular diastolic parameters are  indeterminate. Elevated left  ventricular end-diastolic pressure.   Right Ventricle: The right ventricular size is mildly enlarged. No  increase in right ventricular wall thickness. Right ventricular systolic  function is low normal. There  is mildly elevated pulmonary artery systolic  pressure. The tricuspid regurgitant  velocity is 2.64 m/s, and with an assumed right atrial pressure of 15  mmHg, the estimated right ventricular systolic pressure is 42.9 mmHg.   Left Atrium: Left atrial size was severely dilated.   Right Atrium: Right atrial size was normal in size.   Pericardium: There is no evidence of pericardial effusion.   Mitral Valve: The mitral valve is normal in structure. No evidence of  mitral valve regurgitation. No evidence of mitral valve stenosis. MV peak  gradient, 7.8 mmHg. The mean mitral valve gradient is 2.0 mmHg.   Tricuspid Valve: The tricuspid valve is normal in structure. Tricuspid  valve regurgitation is mild . No evidence of tricuspid stenosis.   Aortic Valve: The aortic valve has been repaired/replaced. Aortic valve  regurgitation is mild. No  aortic stenosis is present. Aortic valve mean  gradient measures 13.3 mmHg. Aortic valve peak gradient measures 26.3  mmHg. There is a unknown mechanical  valve present in the aortic position. Procedure Date: At age 32 years old.   Pulmonic Valve: The pulmonic valve was not well visualized. Pulmonic valve  regurgitation is trivial. No evidence of pulmonic stenosis.   Aorta: Aortic dilatation noted. There is severe dilatation of the aortic  root, measuring 62 mm. There is severe dilatation of the ascending aorta,  measuring 77 mm.   Venous: The inferior vena cava is dilated in size with less than 50%  respiratory variability, suggesting right atrial pressure of 15 mmHg.   IAS/Shunts: No atrial level shunt detected by color flow Doppler.     LEFT VENTRICLE  PLAX 2D  LVIDd:         6.20 cm     Diastology  LVIDs:         4.70 cm     LV e' medial:    7.72 cm/s  LV PW:         1.10 cm     LV E/e' medial:  16.3  LV IVS:        1.10 cm     LV e' lateral:   15.50 cm/s                             LV E/e' lateral: 8.1    LV Volumes (MOD)  LV vol d, MOD A2C: 75.5 ml  LV vol d, MOD A4C: 70.3 ml  LV vol s, MOD A2C: 12.6 ml  LV vol s, MOD A4C: 28.9 ml  LV SV MOD A2C:     62.9 ml  LV SV MOD A4C:     70.3 ml  LV SV MOD BP:      55.9 ml   RIGHT VENTRICLE  RV Basal diam:  4.00 cm  RV Mid diam:    3.40 cm  RV S prime:     10.30 cm/s  TAPSE (M-mode): 2.0 cm   LEFT ATRIUM            Index        RIGHT ATRIUM           Index  LA diam:      5.00 cm  2.37 cm/m   RA Area:     24.00 cm  LA Vol (A2C): 52.6 ml  24.90 ml/m  RA Volume:   58.90 ml  27.88 ml/m  LA Vol (A4C): 142.0 ml 67.21 ml/m   AORTIC VALVE  PULMONIC VALVE  AV Vmax:           256.33 cm/s  PV Vmax:          0.95 m/s  AV Vmean:          167.000 cm/s PV Peak grad:     3.6 mmHg  AV VTI:            0.532 m      PR End Diast Vel: 4.80 msec  AV Peak Grad:      26.3 mmHg  AV Mean Grad:      13.3 mmHg  LVOT Vmax:          104.13 cm/s  LVOT Vmean:        69.933 cm/s  LVOT VTI:          0.238 m  LVOT/AV VTI ratio: 0.45    AORTA  Ao Root diam: 6.20 cm  Ao Asc diam:  7.70 cm   MITRAL VALVE                TRICUSPID VALVE  MV Area (PHT): 4.74 cm     TR Peak grad:   27.9 mmHg  MV Peak grad:  7.8 mmHg     TR Vmax:        264.00 cm/s  MV Mean grad:  2.0 mmHg  MV Vmax:       1.40 m/s     SHUNTS  MV Vmean:      60.8 cm/s    Systemic VTI: 0.24 m  MV Decel Time: 160 msec  MV E velocity: 126.00 cm/s  MV A velocity: 42.20 cm/s  MV E/A ratio:  2.99   Vishnu Priya Mallipeddi  Electronically signed by Winfield Rast Mallipeddi  Signature Date/Time: 04/18/2023/12:52:26 PM        Final (Updated)       Impression:  This 43 year old gentleman has a history of bicuspid aortic valve disease with valvuloplasty at age 59 and mechanical AVR at age 40 and now has a 7.4 cm aortic root and ascending aortic aneurysm of unknown duration.  He has uncontrolled hypertension and has been having episodes of chest discomfort, shortness of breath, headache, and dizziness which prompted evaluation in the emergency room yesterday.  I have personally reviewed his CT scan and the distal ascending aorta just proximal to the innominate artery measures 3.7 cm.  The aortic arch continues to taper down to about 2.6 cm in the proximal descending aorta.  There is no evidence of aortic dissection.  He presented to my office today with hypertensive crisis having headache and dizziness.  He is going to require replacement of the aortic valve/root/ascending aorta under deep hypothermic circulatory arrest with Bentall procedure using mechanical valve conduit and reimplantation of the coronary arteries.  He will need to be off Coumadin for 5 days preoperatively and will need to be started on heparin as a bridge.  He is also going to require a gated coronary CTA to evaluate his coronary anatomy.  This has been scheduled for Monday.  I think the safest  course of action for him will be a direct admission now to the intensive care unit for tight blood pressure control to prevent aortic dissection.  Then we can proceed with coronary CTA on Monday and surgery next Thursday.  His Coumadin should be stopped today and he can be started on heparin.  I reviewed his echo and CT images with him and answered all of his questions. I  discussed the operative procedure with the patient including alternatives, benefits and risks; including but not limited to bleeding, blood transfusion, infection, stroke, myocardial infarction, graft failure, heart block requiring a permanent pacemaker, organ dysfunction, and death.  Chase Lee understands and agrees to proceed.    Plan:  He will be a direct admit to the critical care service on 2H this afternoon for blood pressure control and we will continue preoperative workup with planned surgery next Thursday.  I spent 60 minutes performing this consultation and > 50% of this time was spent face to face counseling and coordinating the care of this patient's aortic root and ascending aortic aneurysm.   Alleen Borne, MD [facility] [phone]

## 2023-04-20 NOTE — Progress Notes (Signed)
Peripherally Inserted Central Catheter Placement  The IV Nurse has discussed with the patient and/or persons authorized to consent for the patient, the purpose of this procedure and the potential benefits and risks involved with this procedure.  The benefits include less needle sticks, lab draws from the catheter, and the patient may be discharged home with the catheter. Risks include, but not limited to, infection, bleeding, blood clot (thrombus formation), and puncture of an artery; nerve damage and irregular heartbeat and possibility to perform a PICC exchange if needed/ordered by physician.  Alternatives to this procedure were also discussed.  Bard Power PICC patient education guide, fact sheet on infection prevention and patient information card has been provided to patient /or left at bedside.    PICC Placement Documentation  PICC Triple Lumen 04/20/23 Right Basilic 46 cm 1 cm (Active)  Indication for Insertion or Continuance of Line Vasoactive infusions 04/20/23 1800  Exposed Catheter (cm) 1 cm 04/20/23 1800  Site Assessment Clean, Dry, Intact 04/20/23 1800  Lumen #1 Status Flushed;Saline locked;Blood return noted 04/20/23 1800  Lumen #2 Status Flushed;Saline locked;Blood return noted 04/20/23 1800  Lumen #3 Status Flushed;Saline locked;Blood return noted 04/20/23 1800  Dressing Type Transparent;Securing device 04/20/23 1800  Dressing Status Antimicrobial disc/dressing in place 04/20/23 1800  Line Care Connections checked and tightened 04/20/23 1800  Line Adjustment (NICU/IV Team Only) No 04/20/23 1800  Dressing Intervention New dressing;Adhesive placed at insertion site (IV team only) 04/20/23 1800  Dressing Change Due 04/27/23 04/20/23 1800       Chase Lee 04/20/2023, 6:43 PM

## 2023-04-20 NOTE — Plan of Care (Signed)

## 2023-04-20 NOTE — Telephone Encounter (Signed)
Patient is calling stating he went to the ED as directed yesterday and had a CT performed. He states the aneurysm has grown to 7.4 cm. He was discharged instead of being taken to [facility]. He just wanted to make sure our office was aware due to the concern with wanting him to go to the ED yesterday.   Patient c/o some dizziness and headache at time of call.   Had pt check BP while on the phone, it is 163/106.   Call transferred to CMA.

## 2023-04-20 NOTE — Progress Notes (Signed)
Pt arrived to unit via wheelchair from Admission area to room 2H22. Pt alert and oriented, assisted into hospital gown after CHG bath.   While standing, patient stated his head was hurting and became short of breath, with a short run of vtach on telemetry. Dr Katrinka Blazing at bedside. See orders.   Patient assisted into bed.Clothing placed into patient belonging bag along with wallet and keys. Pharmacy called to request medication bag as patient brought home dose Xanax with him. While   [place], RN placed 18g in left [facility], labs drawn per orders.   PICC team aware of order for line.

## 2023-04-21 ENCOUNTER — Encounter (HOSPITAL_COMMUNITY): Payer: No Typology Code available for payment source

## 2023-04-21 DIAGNOSIS — I4891 Unspecified atrial fibrillation: Secondary | ICD-10-CM | POA: Diagnosis not present

## 2023-04-21 DIAGNOSIS — I160 Hypertensive urgency: Secondary | ICD-10-CM

## 2023-04-21 DIAGNOSIS — I7121 Aneurysm of the ascending aorta, without rupture: Secondary | ICD-10-CM | POA: Diagnosis not present

## 2023-04-21 DIAGNOSIS — I77810 Thoracic aortic ectasia: Secondary | ICD-10-CM | POA: Diagnosis not present

## 2023-04-21 LAB — CBC
HCT: 35.8 % — ABNORMAL LOW (ref 39.0–52.0)
Hemoglobin: 12.7 g/dL — ABNORMAL LOW (ref 13.0–17.0)
MCH: 31 pg (ref 26.0–34.0)
MCHC: 35.5 g/dL (ref 30.0–36.0)
MCV: 87.3 fL (ref 80.0–100.0)
Platelets: 182 10*3/uL (ref 150–400)
RBC: 4.1 MIL/uL — ABNORMAL LOW (ref 4.22–5.81)
RDW: 14 % (ref 11.5–15.5)
WBC: 5.5 10*3/uL (ref 4.0–10.5)
nRBC: 0 % (ref 0.0–0.2)

## 2023-04-21 LAB — IRON AND TIBC
Iron: 73 ug/dL (ref 45–182)
Saturation Ratios: 20 % (ref 17.9–39.5)
TIBC: 392 ug/dL (ref 250–450)
UIBC: 314 ug/dL

## 2023-04-21 LAB — BASIC METABOLIC PANEL
Anion gap: 11 (ref 5–15)
BUN: 11 mg/dL (ref 6–20)
CO2: 26 mmol/L (ref 22–32)
Calcium: 9 mg/dL (ref 8.9–10.3)
Chloride: 100 mmol/L (ref 98–111)
Creatinine, Ser: 1.05 mg/dL (ref 0.61–1.24)
GFR, Estimated: >60 mL/min (ref >60)
Glucose, Bld: 114 mg/dL — ABNORMAL HIGH (ref 70–99)
Potassium: 4 mmol/L (ref 3.5–5.1)
Sodium: 137 mmol/L (ref 135–145)

## 2023-04-21 LAB — MAGNESIUM: Magnesium: 2.2 mg/dL (ref 1.7–2.4)

## 2023-04-21 LAB — HEPARIN LEVEL (UNFRACTIONATED): Heparin Unfractionated: <0.1 [IU]/mL — ABNORMAL LOW (ref 0.30–0.70)

## 2023-04-21 LAB — PROTIME-INR
INR: 1.8 — ABNORMAL HIGH (ref 0.8–1.2)
Prothrombin Time: 21.5 s — ABNORMAL HIGH (ref 11.4–15.2)

## 2023-04-21 LAB — FERRITIN: Ferritin: 69 ng/mL (ref 24–336)

## 2023-04-21 MED ORDER — HEPARIN (PORCINE) 25000 UT/250ML-% IV SOLN
1600.0000 [IU]/h | INTRAVENOUS | Status: DC
  Administered 2023-04-21: 1200 [IU]/h via INTRAVENOUS
  Administered 2023-04-22 – 2023-04-24 (×4): 1700 [IU]/h via INTRAVENOUS
  Administered 2023-04-25: 1600 [IU]/h via INTRAVENOUS
  Filled 2023-04-21 (×8): qty 250

## 2023-04-21 MED ORDER — LABETALOL HCL 200 MG PO TABS
200.0000 mg | ORAL_TABLET | Freq: Three times a day (TID) | ORAL | Status: DC
  Administered 2023-04-21 – 2023-04-25 (×12): 200 mg via ORAL
  Filled 2023-04-21 (×16): qty 1

## 2023-04-21 MED ORDER — ORAL CARE MOUTH RINSE: 15.0000 mL | OROMUCOSAL | Status: DC | PRN

## 2023-04-21 NOTE — Progress Notes (Signed)
PHARMACY - ANTICOAGULATION CONSULT NOTE  Pharmacy Consult for heparin Indication:  Mechanical AVR  No Known Allergies  Patient Measurements: Weight: 102.8 kg (226 lb 10.1 oz) Heparin Dosing Weight: 88kg  Vital Signs: Temp: 97.9 F (36.6 C) (02/01 0851) Temp Source: Oral (02/01 0851) BP: 111/78 (02/01 0700) Pulse Rate: 53 (02/01 0700)  Labs: Recent Labs    04/19/23 1411 04/20/23 1635 04/20/23 2106 04/21/23 0428  HGB 11.7* 11.8*  --  12.7*  HCT 35.4* 35.0*  --  35.8*  PLT 163 170  --  182  LABPROT 27.0*  --  23.0* 21.5*  INR 2.5*  --  2.0* 1.8*  CREATININE 1.05 1.13  --  1.05    Estimated Creatinine Clearance: 99.8 mL/min (by C-G formula based on SCr of 1.05 mg/dL).   Medical History: Past Medical History:  Diagnosis Date   Anxiety    History of artificial heart valve 2008   surgery at 25   HTN (hypertension) 04/05/2023      Assessment: 43yom with Hx mechanical AVR on warfarin pta INR 2.5 on admit - hold warfarin for planned procedure  INR < 2 now will bridge with heparin drip  Cbc stable    Goal of Therapy:  Heparin level 0.3-0.7 units/ml Monitor platelets by anticoagulation protocol: Yes   Plan:  Heparin drip 1200 uts/hr  Draw heparin level 6hr after start Daily heparin level and CBC  Monitor s/s bleeding   Leota Sauers Pharm.D. CPP, BCPS Clinical Pharmacist [phone] 04/21/2023 9:46 AM    Leota Sauers Pharm.D. CPP, BCPS Clinical Pharmacist [phone] 04/21/2023 9:44 AM

## 2023-04-21 NOTE — Plan of Care (Signed)
  Problem: Education: Goal: Knowledge of General Education information will improve Description: Including pain rating scale, medication(s)/side effects and non-pharmacologic comfort measures Outcome: Progressing   Problem: Clinical Measurements: Goal: Cardiovascular complication will be avoided Outcome: Progressing   Problem: Nutrition: Goal: Adequate nutrition will be maintained Outcome: Progressing   Problem: Coping: Goal: Level of anxiety will decrease Outcome: Progressing   Problem: Elimination: Goal: Will not experience complications related to bowel motility Outcome: Progressing

## 2023-04-21 NOTE — Progress Notes (Addendum)
PHARMACY - ANTICOAGULATION CONSULT NOTE  Pharmacy Consult for heparin Indication:  Mechanical AVR  No Known Allergies  Patient Measurements: Weight: 102.8 kg (226 lb 10.1 oz) Heparin Dosing Weight: 88kg  Vital Signs: Temp: 98.1 F (36.7 C) (02/01 1157) Temp Source: Oral (02/01 1157) BP: 109/67 (02/01 1615) Pulse Rate: 52 (02/01 1630)  Labs: Recent Labs    04/19/23 1411 04/20/23 1635 04/20/23 2106 04/21/23 0428 04/21/23 1643  HGB 11.7* 11.8*  --  12.7*  --   HCT 35.4* 35.0*  --  35.8*  --   PLT 163 170  --  182  --   LABPROT 27.0*  --  23.0* 21.5*  --   INR 2.5*  --  2.0* 1.8*  --   HEPARINUNFRC  --   --   --   --  <0.10*  CREATININE 1.05 1.13  --  1.05  --     Estimated Creatinine Clearance: 99.8 mL/min (by C-G formula based on SCr of 1.05 mg/dL).   Medical History: Past Medical History:  Diagnosis Date   Anxiety    History of artificial heart valve 2008   surgery at 25   HTN (hypertension) 04/05/2023      Assessment: 43yom with Hx mechanical AVR on warfarin pta INR 2.5 on admit - hold warfarin for planned procedure  INR < 2 now will bridge with heparin drip  Cbc stable    Initial heparin level undetectable:  on 1200 units/hr, no issues with infusion or overt s/sx of bleeding reported. CTA coronary scheduled for Monday 2/3  Goal of Therapy:  Heparin level 0.3-0.7 units/ml Monitor platelets by anticoagulation protocol: Yes   Plan:  Increase heparin drip to 1500 uts/hr  8 hour heparin level Daily heparin level and CBC  Monitor s/s bleeding   Ruben Im, PharmD Clinical Pharmacist 04/21/2023 6:04 PM Please check AMION for all [facility] Pharmacy numbers

## 2023-04-21 NOTE — Progress Notes (Signed)
      [address]       [place] [address]             [phone]         Procedure(s) (LRB): REDO STERNOTOMY (N/A) BENTALL PROCEDURE (N/A) REPLACEMENT ASCENDING AORTIC ANEURYSM (N/A) TRANSESOPHAGEAL ECHOCARDIOGRAM (TEE) (N/A)   Total Length of Stay:  LOS: 1 day    SUBJECTIVE: Feels well this am No longer with headache  Vitals:   04/21/23 0630 04/21/23 0700  BP: 121/77 111/78  Pulse: (!) 57 (!) 53  Resp: 11 15  Temp:    SpO2: 97% 90%    Intake/Output      01/31 0701 02/01 0700 02/01 0701 02/02 0700   I.V. (mL/kg) 168.2 (1.6)    Total Intake(mL/kg) 168.2 (1.6)    Urine (mL/kg/hr) 2400    Total Output 2400    Net -2231.9             clevidipine 10 mg/hr (04/21/23 0806)    CBC    Component Value Date/Time   WBC 5.5 04/21/2023 0428   RBC 4.10 (L) 04/21/2023 0428   HGB 12.7 (L) 04/21/2023 0428   HCT 35.8 (L) 04/21/2023 0428   PLT 182 04/21/2023 0428   MCV 87.3 04/21/2023 0428   MCH 31.0 04/21/2023 0428   MCHC 35.5 04/21/2023 0428   RDW 14.0 04/21/2023 0428   LYMPHSABS 1.5 04/19/2023 1411   MONOABS 0.6 04/19/2023 1411   EOSABS 0.1 04/19/2023 1411   BASOSABS 0.0 04/19/2023 1411   CMP     Component Value Date/Time   NA 137 04/21/2023 0428   K 4.0 04/21/2023 0428   CL 100 04/21/2023 0428   CO2 26 04/21/2023 0428   GLUCOSE 114 (H) 04/21/2023 0428   BUN 11 04/21/2023 0428   CREATININE 1.05 04/21/2023 0428   CALCIUM 9.0 04/21/2023 0428   PROT 6.4 (L) 04/20/2023 1635   ALBUMIN 3.9 04/20/2023 1635   AST 30 04/20/2023 1635   ALT 49 (H) 04/20/2023 1635   ALKPHOS 75 04/20/2023 1635   BILITOT 0.9 04/20/2023 1635   GFRNONAA >60 04/21/2023 0428   GFRAA >60 08/12/2019 0734   ABG No results found for: "PHART", "PCO2ART", "PO2ART", "HCO3", "TCO2", "ACIDBASEDEF", "O2SAT" CBG (last 3)  No results for input(s): "GLUCAP" in the last 72 hours. EXAM Card: RR  ASSESSMENT: Ascending and aortic root aneurysm Continue IV  antihypertensives for now.  For CTA coronary on Monday Ambulate   Eugenio Hoes, MD 04/21/2023

## 2023-04-21 NOTE — H&P (Signed)
NAME:  Chase Lee, MRN:  [id number], DOB:  10-Dec-1980, LOS: 1 ADMISSION DATE:  04/20/2023, CONSULTATION DATE:  04/19/22 REFERRING MD:  Laneta Simmers, CHIEF COMPLAINT:  HA   History of Present Illness:  43 year old man w/ hx of anxiety, chronic pain, HTN, and aortic valve replacement x 2 on coumadin presenting with increasing chest pain, HA, and DOE.  Workup has revealed an extremely large thoracic aneurysm as well as significant hypertension.  He is going to be direct admitted for blood pressure control, coumadin washout and eventual bentall.  Pertinent  Medical History  HTN Bicuspid aortic valve Anxiety Chronic pain previously on suboxone  Significant Hospital Events: Including procedures, antibiotic start and stop dates in addition to other pertinent events   1/31 admit  Interim History / Subjective:  Stated headache is better Remain on clevidipine infusion at 10 mg  Objective   Examination: General: Middle-age obese male, lying on the bed HEENT: [place]/AT, eyes anicteric.  moist mucus membranes Neuro: Alert, awake following commands Chest: Coarse breath sounds, no wheezes or rhonchi Heart: Bradycardic, regular rhythm, no murmurs or gallops Abdomen: Soft, nontender, nondistended, bowel sounds present Skin: No rash  Resolved Hospital Problem list   N/A  Assessment & Plan:  Symptomatic ascending and aortic root aneurysm Paroxysmal A-fib Status post aortic valve replacement on Coumadin Hypertensive urgency Chronic pain/anxiety GERD  TCTS following Currently in sinus rhythm Coumadin was stopped, INR dropped to 1.8 Continue IV heparin infusion Continue clevidipine infusion with SBP goal <120 Started on labetalol 200 mg 3 times daily PTA xanax ordered as well as percocet Plan for coumadin washout, gated CTA to look at coronaries Monday (LHC contraindicated with such a large aneurysm) then tentative bentall late next week Continue Protonix  Best Practice (right click and  "Reselect all SmartList Selections" daily)   Diet/type: Regular consistency (see orders) DVT prophylaxis: IV heparin infusion Pressure ulcer(s): N/A GI prophylaxis: PPI Lines: PICC line Foley:  N/A Code Status:  full code Last date of multidisciplinary goals of care discussion: 2/1, patient was updated at bedside  Labs   CBC: Recent Labs  Lab 04/19/23 1411 04/20/23 1635 04/21/23 0428  WBC 6.6 6.5 5.5  NEUTROABS 4.4  --   --   HGB 11.7* 11.8* 12.7*  HCT 35.4* 35.0* 35.8*  MCV 88.5 86.6 87.3  PLT 163 170 182    Basic Metabolic Panel: Recent Labs  Lab 04/19/23 1411 04/20/23 1635 04/21/23 0428  NA 137 141 137  K 4.0 3.9 4.0  CL 104 106 100  CO2 24 25 26   GLUCOSE 127* 76 114*  BUN 16 11 11   CREATININE 1.05 1.13 1.05  CALCIUM 8.6* 8.8* 9.0  MG  --  2.0 2.2  PHOS  --  3.9  --    GFR: Estimated Creatinine Clearance: 99.8 mL/min (by C-G formula based on SCr of 1.05 mg/dL). Recent Labs  Lab 04/19/23 1411 04/20/23 1635 04/20/23 1700 04/21/23 0428  WBC 6.6 6.5  --  5.5  LATICACIDVEN  --   --  1.1  --     Liver Function Tests: Recent Labs  Lab 04/19/23 1411 04/20/23 1635  AST 31 30  ALT 53* 49*  ALKPHOS 72 75  BILITOT 0.7 0.9  PROT 7.0 6.4*  ALBUMIN 4.2 3.9   No results for input(s): "LIPASE", "AMYLASE" in the last 168 hours. No results for input(s): "AMMONIA" in the last 168 hours.  ABG No results found for: "PHART", "PCO2ART", "PO2ART", "HCO3", "TCO2", "ACIDBASEDEF", "O2SAT"  Coagulation Profile: Recent Labs  Lab 04/19/23 1411 04/20/23 2106 04/21/23 0428  INR 2.5* 2.0* 1.8*    Cardiac Enzymes: No results for input(s): "CKTOTAL", "CKMB", "CKMBINDEX", "TROPONINI" in the last 168 hours.  HbA1C: No results found for: "HGBA1C"  CBG: No results for input(s): "GLUCAP" in the last 168 hours.  The patient is critically ill due to hypertensive urgency/ascending/aortic root aneurysm.  Critical care was necessary to treat or prevent imminent or  life-threatening deterioration.  Critical care was time spent personally by me on the following activities: development of treatment plan with patient and/or surrogate as well as nursing, discussions with consultants, evaluation of patient's response to treatment, examination of patient, obtaining history from patient or surrogate, ordering and performing treatments and interventions, ordering and review of laboratory studies, ordering and review of radiographic studies, pulse oximetry, re-evaluation of patient's condition and participation in multidisciplinary rounds.   During this encounter critical care time was devoted to patient care services described in this note for 33 minutes.     Cheri Fowler, MD [place] [facility] See Amion for pager If no response to pager, please call [phone] until 7pm After 7pm, Please call E-link [phone]

## 2023-04-22 DIAGNOSIS — I160 Hypertensive urgency: Secondary | ICD-10-CM | POA: Diagnosis not present

## 2023-04-22 DIAGNOSIS — I7121 Aneurysm of the ascending aorta, without rupture: Secondary | ICD-10-CM | POA: Diagnosis not present

## 2023-04-22 DIAGNOSIS — I77810 Thoracic aortic ectasia: Secondary | ICD-10-CM | POA: Diagnosis not present

## 2023-04-22 DIAGNOSIS — I4891 Unspecified atrial fibrillation: Secondary | ICD-10-CM | POA: Diagnosis not present

## 2023-04-22 LAB — HEPARIN LEVEL (UNFRACTIONATED)
Heparin Unfractionated: 0.26 [IU]/mL — ABNORMAL LOW (ref 0.30–0.70)
Heparin Unfractionated: 0.44 [IU]/mL (ref 0.30–0.70)

## 2023-04-22 LAB — BASIC METABOLIC PANEL
Anion gap: 10 (ref 5–15)
BUN: 16 mg/dL (ref 6–20)
CO2: 24 mmol/L (ref 22–32)
Calcium: 8.9 mg/dL (ref 8.9–10.3)
Chloride: 103 mmol/L (ref 98–111)
Creatinine, Ser: 1.22 mg/dL (ref 0.61–1.24)
GFR, Estimated: >60 mL/min (ref >60)
Glucose, Bld: 166 mg/dL — ABNORMAL HIGH (ref 70–99)
Potassium: 3.4 mmol/L — ABNORMAL LOW (ref 3.5–5.1)
Sodium: 137 mmol/L (ref 135–145)

## 2023-04-22 LAB — CBC
HCT: 38.1 % — ABNORMAL LOW (ref 39.0–52.0)
Hemoglobin: 12.8 g/dL — ABNORMAL LOW (ref 13.0–17.0)
MCH: 29.6 pg (ref 26.0–34.0)
MCHC: 33.6 g/dL (ref 30.0–36.0)
MCV: 88.2 fL (ref 80.0–100.0)
Platelets: 173 10*3/uL (ref 150–400)
RBC: 4.32 MIL/uL (ref 4.22–5.81)
RDW: 13.8 % (ref 11.5–15.5)
WBC: 6.3 10*3/uL (ref 4.0–10.5)
nRBC: 0 % (ref 0.0–0.2)

## 2023-04-22 LAB — MAGNESIUM: Magnesium: 2 mg/dL (ref 1.7–2.4)

## 2023-04-22 LAB — PROTIME-INR
INR: 1.5 — ABNORMAL HIGH (ref 0.8–1.2)
Prothrombin Time: 18 s — ABNORMAL HIGH (ref 11.4–15.2)

## 2023-04-22 MED ORDER — POTASSIUM CHLORIDE CRYS ER 20 MEQ PO TBCR
40.0000 meq | EXTENDED_RELEASE_TABLET | Freq: Once | ORAL | Status: AC
  Administered 2023-04-22: 40 meq via ORAL
  Filled 2023-04-22: qty 2

## 2023-04-22 NOTE — Progress Notes (Signed)
      [address]       [place] [address]             [phone]         Procedure(s) (LRB): REDO STERNOTOMY (N/A) BENTALL PROCEDURE (N/A) REPLACEMENT ASCENDING AORTIC ANEURYSM (N/A) TRANSESOPHAGEAL ECHOCARDIOGRAM (TEE) (N/A)   Total Length of Stay:  LOS: 2 days    SUBJECTIVE: Feels much better with headaches resolving  Vitals:   04/22/23 0745 04/22/23 0800  BP: 118/67 111/69  Pulse: (!) 56 (!) 53  Resp: 19 15  Temp:    SpO2: 91% 92%    Intake/Output      02/01 0701 02/02 0700 02/02 0701 02/03 0700   P.O. 680    I.V. (mL/kg) 468.8 (4.6)    Total Intake(mL/kg) 1148.8 (11.3)    Urine (mL/kg/hr) 1100 (0.4)    Emesis/NG output 0    Stool 0    Total Output 1100    Net +48.8         Urine Occurrence 3 x    Stool Occurrence 0 x    Emesis Occurrence 0 x        clevidipine Stopped (04/21/23 2220)   heparin 1,700 Units/hr (04/22/23 0600)    CBC    Component Value Date/Time   WBC 6.3 04/22/2023 0210   RBC 4.32 04/22/2023 0210   HGB 12.8 (L) 04/22/2023 0210   HCT 38.1 (L) 04/22/2023 0210   PLT 173 04/22/2023 0210   MCV 88.2 04/22/2023 0210   MCH 29.6 04/22/2023 0210   MCHC 33.6 04/22/2023 0210   RDW 13.8 04/22/2023 0210   LYMPHSABS 1.5 04/19/2023 1411   MONOABS 0.6 04/19/2023 1411   EOSABS 0.1 04/19/2023 1411   BASOSABS 0.0 04/19/2023 1411   CMP     Component Value Date/Time   NA 137 04/22/2023 0210   K 3.4 (L) 04/22/2023 0210   CL 103 04/22/2023 0210   CO2 24 04/22/2023 0210   GLUCOSE 166 (H) 04/22/2023 0210   BUN 16 04/22/2023 0210   CREATININE 1.22 04/22/2023 0210   CALCIUM 8.9 04/22/2023 0210   PROT 6.4 (L) 04/20/2023 1635   ALBUMIN 3.9 04/20/2023 1635   AST 30 04/20/2023 1635   ALT 49 (H) 04/20/2023 1635   ALKPHOS 75 04/20/2023 1635   BILITOT 0.9 04/20/2023 1635   GFRNONAA >60 04/22/2023 0210   GFRAA >60 08/12/2019 0734   ABG No results found for: "PHART", "PCO2ART", "PO2ART", "HCO3", "TCO2", "ACIDBASEDEF",  "O2SAT" CBG (last 3)  No results for input(s): "GLUCAP" in the last 72 hours.   ASSESSMENT: HTN crisis Ascending aortic aneurysm Continue IV HTN control On Heparin Awaiting surgery   Eugenio Hoes, MD 04/22/2023

## 2023-04-22 NOTE — Progress Notes (Signed)
[facility] ADULT ICU REPLACEMENT PROTOCOL   The patient does apply for the [facility] Adult ICU Electrolyte Replacment Protocol based on the criteria listed below:   1.Exclusion criteria: TCTS, ECMO, Dialysis, and Myasthenia Gravis patients 2. Is GFR >/= 30 ml/min? Yes.    Patient's GFR today is > 60 3. Is SCr </= 2? Yes.   Patient's SCr is 1.22 mg/dL 4. Did SCr increase >/= 0.5 in 24 hours? No. 5.Pt's weight >40kg  Yes.   6. Abnormal electrolyte(s): K+ 3.4  7. Electrolytes replaced per protocol 8.  Call MD STAT for K+ </= 2.5, Phos </= 1, or Mag </= 1 Physician:  Dr Rhodia Albright, Jettie Booze 04/22/2023 4:28 AM

## 2023-04-22 NOTE — Plan of Care (Signed)

## 2023-04-22 NOTE — Progress Notes (Signed)
NAME:  Helmer Dull, MRN:  [id number], DOB:  10-23-1980, LOS: 2 ADMISSION DATE:  04/20/2023, CONSULTATION DATE:  04/19/22 REFERRING MD:  Laneta Simmers, CHIEF COMPLAINT:  HA   History of Present Illness:  43 year old man w/ hx of anxiety, chronic pain, HTN, and aortic valve replacement x 2 on coumadin presenting with increasing chest pain, HA, and DOE.  Workup has revealed an extremely large thoracic aneurysm as well as significant hypertension.  He is going to be direct admitted for blood pressure control, coumadin washout and eventual bentall.  Pertinent  Medical History  HTN Bicuspid aortic valve Anxiety Chronic pain previously on suboxone  Significant Hospital Events: Including procedures, antibiotic start and stop dates in addition to other pertinent events   1/31 admit  Interim History / Subjective:  Denies any complaint Stated headache is better No overnight issues Came off of clevidipine infusion  Objective   Examination: General: Middle aged male, lying on the bed HEENT: [employer]/AT, eyes anicteric.  moist mucus membranes Neuro: Alert, awake following commands Chest: Coarse breath sounds, no wheezes or rhonchi Heart: Regular rate and rhythm, no murmurs or gallops Abdomen: Soft, nontender, nondistended, bowel sounds present Skin: No rash  Labs and images reviewed  Resolved Hospital Problem list   N/A  Assessment & Plan:  Symptomatic ascending and aortic root aneurysm Paroxysmal A-fib Status post aortic valve replacement on Coumadin Hypertensive urgency Chronic pain/anxiety GERD Hypokalemia  TCTS following, patient will have surgery probably next week Currently in sinus rhythm Off Coumadin Continue IV heparin infusion Continue labetalol 200 mg 3 times daily Clevidipine was titrated off PTA xanax ordered as well as percocet Plan for coumadin washout, gated CTA to look at coronaries Monday (LHC contraindicated with such a large aneurysm) then tentative bentall late next  week Continue Protonix Continue aggressive electrolyte replacement  Best Practice (right click and "Reselect all SmartList Selections" daily)   Diet/type: Regular consistency (see orders) DVT prophylaxis: IV heparin infusion Pressure ulcer(s): N/A GI prophylaxis: PPI Lines: PICC line Foley:  N/A Code Status:  full code Last date of multidisciplinary goals of care discussion: 2/1, patient was updated at bedside  Labs   CBC: Recent Labs  Lab 04/19/23 1411 04/20/23 1635 04/21/23 0428 04/22/23 0210  WBC 6.6 6.5 5.5 6.3  NEUTROABS 4.4  --   --   --   HGB 11.7* 11.8* 12.7* 12.8*  HCT 35.4* 35.0* 35.8* 38.1*  MCV 88.5 86.6 87.3 88.2  PLT 163 170 182 173    Basic Metabolic Panel: Recent Labs  Lab 04/19/23 1411 04/20/23 1635 04/21/23 0428 04/22/23 0210  NA 137 141 137 137  K 4.0 3.9 4.0 3.4*  CL 104 106 100 103  CO2 24 25 26 24   GLUCOSE 127* 76 114* 166*  BUN 16 11 11 16   CREATININE 1.05 1.13 1.05 1.22  CALCIUM 8.6* 8.8* 9.0 8.9  MG  --  2.0 2.2 2.0  PHOS  --  3.9  --   --    GFR: Estimated Creatinine Clearance: 85.5 mL/min (by C-G formula based on SCr of 1.22 mg/dL). Recent Labs  Lab 04/19/23 1411 04/20/23 1635 04/20/23 1700 04/21/23 0428 04/22/23 0210  WBC 6.6 6.5  --  5.5 6.3  LATICACIDVEN  --   --  1.1  --   --     Liver Function Tests: Recent Labs  Lab 04/19/23 1411 04/20/23 1635  AST 31 30  ALT 53* 49*  ALKPHOS 72 75  BILITOT 0.7 0.9  PROT 7.0  6.4*  ALBUMIN 4.2 3.9   No results for input(s): "LIPASE", "AMYLASE" in the last 168 hours. No results for input(s): "AMMONIA" in the last 168 hours.  ABG No results found for: "PHART", "PCO2ART", "PO2ART", "HCO3", "TCO2", "ACIDBASEDEF", "O2SAT"   Coagulation Profile: Recent Labs  Lab 04/19/23 1411 04/20/23 2106 04/21/23 0428 04/22/23 0210  INR 2.5* 2.0* 1.8* 1.5*    Cardiac Enzymes: No results for input(s): "CKTOTAL", "CKMB", "CKMBINDEX", "TROPONINI" in the last 168 hours.  HbA1C: No  results found for: "HGBA1C"  CBG: No results for input(s): "GLUCAP" in the last 168 hours.     Cheri Fowler, MD [place] [facility] See Amion for pager If no response to pager, please call [phone] until 7pm After 7pm, Please call E-link [phone]

## 2023-04-22 NOTE — Progress Notes (Signed)
PHARMACY - ANTICOAGULATION CONSULT NOTE  Pharmacy Consult for heparin Indication:  Mechanical AVR  No Known Allergies  Patient Measurements: Weight: 102.1 kg (225 lb 1.4 oz) Heparin Dosing Weight: 88kg  Vital Signs: Temp: 98.1 F (36.7 C) (02/02 0735) Temp Source: Oral (02/02 0735) BP: 121/70 (02/02 0927) Pulse Rate: 50 (02/02 0927)  Labs: Recent Labs    04/20/23 1635 04/20/23 2106 04/21/23 0428 04/21/23 1643 04/22/23 0210 04/22/23 1139  HGB 11.8*  --  12.7*  --  12.8*  --   HCT 35.0*  --  35.8*  --  38.1*  --   PLT 170  --  182  --  173  --   LABPROT  --  23.0* 21.5*  --  18.0*  --   INR  --  2.0* 1.8*  --  1.5*  --   HEPARINUNFRC  --   --   --  <0.10* 0.26* 0.44  CREATININE 1.13  --  1.05  --  1.22  --     Estimated Creatinine Clearance: 85.5 mL/min (by C-G formula based on SCr of 1.22 mg/dL).   Medical History: Past Medical History:  Diagnosis Date   Anxiety    History of artificial heart valve 2008   surgery at 25   HTN (hypertension) 04/05/2023      Assessment: 43yom with Hx mechanical AVR on warfarin pta INR 2.5 on admit - hold warfarin for planned procedure  INR < 2 now will bridge with heparin drip  Cbc stable   Heparin drip rate 1700 uts/hr with heparin level 0.44 at goal. No issues with infusion or overt s/sx of bleeding reported. CTA coronary scheduled for Monday 2/3  Goal of Therapy:  Heparin level 0.3-0.7 units/ml Monitor platelets by anticoagulation protocol: Yes   Plan:  Continue heparin drip 1700 uts/hr  Daily heparin level and CBC  Monitor s/s bleeding     Leota Sauers Pharm.D. CPP, BCPS Clinical Pharmacist [phone] 04/22/2023 12:06 PM   Please check AMION for all [facility] Pharmacy numbers

## 2023-04-22 NOTE — Progress Notes (Signed)
PHARMACY - ANTICOAGULATION CONSULT NOTE  Pharmacy Consult for heparin Indication:  AVR  Labs: Recent Labs    04/19/23 1411 04/20/23 1635 04/20/23 2106 04/21/23 0428 04/21/23 1643 04/22/23 0210  HGB 11.7* 11.8*  --  12.7*  --  12.8*  HCT 35.4* 35.0*  --  35.8*  --  38.1*  PLT 163 170  --  182  --  173  LABPROT 27.0*  --  23.0* 21.5*  --   --   INR 2.5*  --  2.0* 1.8*  --   --   HEPARINUNFRC  --   --   --   --  <0.10* 0.26*  CREATININE 1.05 1.13  --  1.05  --   --    Assessment: 43yo male subtherapeutic on heparin after rate change but approaching goal; no infusion issues or signs of bleeding per RN.  Goal of Therapy:  Heparin level 0.3-0.7 units/ml   Plan:  Increase heparin infusion by 2 units/kg/hr to 1700 units/hr. Check level in 6 hours.   Vernard Gambles, PharmD, BCPS 04/22/2023 2:37 AM

## 2023-04-23 ENCOUNTER — Other Ambulatory Visit: Payer: Self-pay | Admitting: Surgery

## 2023-04-23 ENCOUNTER — Inpatient Hospital Stay (HOSPITAL_COMMUNITY): Payer: 59

## 2023-04-23 ENCOUNTER — Encounter: Payer: Self-pay | Admitting: Surgery

## 2023-04-23 ENCOUNTER — Ambulatory Visit (HOSPITAL_COMMUNITY)
Admission: RE | Admit: 2023-04-23 | Discharge: 2023-04-23 | Disposition: A | Discharge status: Home / Self Care | Payer: 59 | Source: Ambulatory Visit | Attending: Surgery | Admitting: Surgery

## 2023-04-23 DIAGNOSIS — I160 Hypertensive urgency: Secondary | ICD-10-CM

## 2023-04-23 DIAGNOSIS — K219 Gastro-esophageal reflux disease without esophagitis: Secondary | ICD-10-CM | POA: Diagnosis not present

## 2023-04-23 DIAGNOSIS — R0789 Other chest pain: Secondary | ICD-10-CM | POA: Diagnosis present

## 2023-04-23 DIAGNOSIS — I480 Paroxysmal atrial fibrillation: Secondary | ICD-10-CM

## 2023-04-23 DIAGNOSIS — I77810 Thoracic aortic ectasia: Secondary | ICD-10-CM

## 2023-04-23 DIAGNOSIS — I712 Thoracic aortic aneurysm, without rupture, unspecified: Secondary | ICD-10-CM | POA: Diagnosis not present

## 2023-04-23 DIAGNOSIS — I2510 Atherosclerotic heart disease of native coronary artery without angina pectoris: Secondary | ICD-10-CM

## 2023-04-23 LAB — CBC
HCT: 38.9 % — ABNORMAL LOW (ref 39.0–52.0)
Hemoglobin: 12.8 g/dL — ABNORMAL LOW (ref 13.0–17.0)
MCH: 29.4 pg (ref 26.0–34.0)
MCHC: 32.9 g/dL (ref 30.0–36.0)
MCV: 89.4 fL (ref 80.0–100.0)
Platelets: 191 10*3/uL (ref 150–400)
RBC: 4.35 MIL/uL (ref 4.22–5.81)
RDW: 14.3 % (ref 11.5–15.5)
WBC: 6.8 10*3/uL (ref 4.0–10.5)
nRBC: 0 % (ref 0.0–0.2)

## 2023-04-23 LAB — BASIC METABOLIC PANEL
Anion gap: 7 (ref 5–15)
BUN: 14 mg/dL (ref 6–20)
CO2: 24 mmol/L (ref 22–32)
Calcium: 8.7 mg/dL — ABNORMAL LOW (ref 8.9–10.3)
Chloride: 105 mmol/L (ref 98–111)
Creatinine, Ser: 1.13 mg/dL (ref 0.61–1.24)
GFR, Estimated: >60 mL/min (ref >60)
Glucose, Bld: 142 mg/dL — ABNORMAL HIGH (ref 70–99)
Potassium: 3.7 mmol/L (ref 3.5–5.1)
Sodium: 136 mmol/L (ref 135–145)

## 2023-04-23 LAB — HEPARIN LEVEL (UNFRACTIONATED): Heparin Unfractionated: 0.42 [IU]/mL (ref 0.30–0.70)

## 2023-04-23 LAB — MAGNESIUM: Magnesium: 2 mg/dL (ref 1.7–2.4)

## 2023-04-23 LAB — PROTIME-INR
INR: 1.2 (ref 0.8–1.2)
Prothrombin Time: 15.8 s — ABNORMAL HIGH (ref 11.4–15.2)

## 2023-04-23 MED ORDER — NITROGLYCERIN 0.4 MG SL SUBL
SUBLINGUAL_TABLET | SUBLINGUAL | Status: AC
  Filled 2023-04-23: qty 2

## 2023-04-23 MED ORDER — POTASSIUM CHLORIDE CRYS ER 20 MEQ PO TBCR
40.0000 meq | EXTENDED_RELEASE_TABLET | Freq: Once | ORAL | Status: AC
  Administered 2023-04-23: 40 meq via ORAL
  Filled 2023-04-23: qty 2

## 2023-04-23 MED ORDER — NITROGLYCERIN 0.4 MG SL SUBL
0.8000 mg | SUBLINGUAL_TABLET | Freq: Once | SUBLINGUAL | Status: AC
  Administered 2023-04-23: 0.8 mg via SUBLINGUAL

## 2023-04-23 MED ORDER — IOHEXOL 350 MG/ML SOLN
95.0000 mL | Freq: Once | INTRAVENOUS | Status: AC | PRN
  Administered 2023-04-23: 95 mL via INTRAVENOUS

## 2023-04-23 NOTE — TOC Initial Note (Signed)
Transition of Care [facility]) - Initial/Assessment Note    Patient Details  Name: Chase Lee MRN: [id number] Date of Birth: [birth year]  Transition of [facility] CM/SW Contact:    Gala Lewandowsky, RN Phone Number: 04/23/2023, 12:04 PM  Clinical Narrative: Patient awaits surgery on 04-26-23. PTA patient states he is independent from home with spouse. Patient has insurance and PCP.  No home needs identified at this time. Case Manager will continue to follow for additional transition of care needs as the patient progresses.              Expected Discharge Plan: Home/Self Care Barriers to Discharge: Continued Medical Work up   Patient Goals and CMS Choice Patient states their goals for this hospitalization and ongoing recovery are:: Plan to return home once stable.   Choice offered to / list presented to : NA      Expected Discharge Plan and Services   Discharge Planning Services: CM Consult Post Acute Care Choice: NA Living arrangements for the past 2 months: Single Family Home                   DME Agency: NA   Prior Living Arrangements/Services Living arrangements for the past 2 months: Single Family Home Lives with:: Spouse Patient language and need for interpreter reviewed:: Yes        Need for Family Participation in Patient Care: Yes (Comment) Care giver support system in place?: Yes (comment)   Criminal Activity/Legal Involvement Pertinent to Current Situation/Hospitalization: No - Comment as needed  Activities of Daily Living   ADL Screening (condition at time of admission) Independently performs ADLs?: Yes (appropriate for developmental age) Is the patient deaf or have difficulty hearing?: No Does the patient have difficulty seeing, even when wearing glasses/contacts?: No    Emotional Assessment Appearance:: Appears stated age Attitude/Demeanor/Rapport: Engaged Affect (typically observed): Appropriate Orientation: : Oriented to Self, Oriented to  Time,  Oriented to Situation, Oriented to Place Alcohol / Substance Use: Not Applicable Psych Involvement: No (comment)  Admission diagnosis:  Ascending aorta dilatation (HCC) [I77.810] Patient Active Problem List   Diagnosis Date Noted   Ascending aorta dilatation (HCC) 04/20/2023   Ascending aortic aneurysm (HCC) 04/05/2023   HTN (hypertension) 04/05/2023   Palpitations 08/17/2022   Right sided abdominal pain    Colitis 06/11/2021   Encounter for therapeutic drug monitoring 06/11/2021   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 06/11/2021   Disorder of hepatic portal vein/Small amount of portal venous gas in the left lobe of the liver 06/11/2021   Status post mechanical aortic valve replacement at Age 43 06/11/2021   PCP:  [facility] Pharmacy:   [facility] [phone] - [place], [place] - [address] AT [facility] [address] [place] [address] Phone: [phone] Fax: [phone]  Social Drivers of Health (SDOH) Social History: SDOH Screenings   Food Insecurity: No Food Insecurity (04/20/2023)  Housing: Unknown (04/20/2023)  Transportation Needs: No Transportation Needs (04/20/2023)  Utilities: Not At Risk (04/20/2023)  Social Connections: Socially Integrated (04/20/2023)  Tobacco Use: High Risk (04/20/2023)   Readmission Risk Interventions    06/13/2021   11:06 AM  Readmission Risk Prevention Plan  Medication Screening Complete  Transportation Screening Complete

## 2023-04-23 NOTE — Progress Notes (Signed)
Carotid arterial duplex completed. Please see CV Procedures for preliminary results.  Shona Simpson, RVT 04/23/23 2:15 PM

## 2023-04-23 NOTE — Progress Notes (Signed)
NAME:  Chase Lee, MRN:  [id number], DOB:  01/06/1981, LOS: 3 ADMISSION DATE:  04/20/2023, CONSULTATION DATE:  04/19/22 REFERRING MD:  Laneta Simmers, CHIEF COMPLAINT:  HA   History of Present Illness:  43 year old man w/ hx of anxiety, chronic pain, HTN, and aortic valve replacement x 2 on coumadin presenting with increasing chest pain, HA, and DOE.  Workup has revealed an extremely large thoracic aneurysm as well as significant hypertension.  He is going to be direct admitted for blood pressure control, coumadin washout and eventual bentall.  Pertinent  Medical History  HTN Bicuspid aortic valve Anxiety Chronic pain previously on suboxone  Significant Hospital Events: Including procedures, antibiotic start and stop dates in addition to other pertinent events   1/31 admit  Interim History / Subjective:  Patient is complaining of headache this morning Remain off IV hypertensives Still on IV heparin infusion Underwent repeat CT angiogram of chest today  Objective   Examination: General: Middle-aged male sitting on the bed HEENT: [place]/AT, eyes anicteric.  moist mucus membranes Neuro: Alert, awake following commands Chest: Coarse breath sounds, no wheezes or rhonchi Heart: Regular rate and rhythm, no murmurs or gallops Abdomen: Soft, nontender, nondistended, bowel sounds present Skin: No rash  Labs and images reviewed  Resolved Hospital Problem list   N/A  Assessment & Plan:  Symptomatic ascending and aortic root aneurysm Paroxysmal A-fib Status post aortic valve replacement on Coumadin Hypertensive urgency Chronic pain/anxiety GERD Hypokalemia  TCTS following, patient will have surgery probably later this week Repeat CT angiogram of chest was done today, report is pending Remain in sinus rhythm Continue IV heparin infusion Continue labetalol 200 mg 3 times daily, blood pressure is controlled Off IV hypertensive meds Continue Xanax and Percocet which she takes at  home Continue Protonix Continue aggressive electrolyte replacement  Best Practice (right click and "Reselect all SmartList Selections" daily)   Diet/type: Regular consistency (see orders) DVT prophylaxis: IV heparin infusion Pressure ulcer(s): N/A GI prophylaxis: PPI Lines: PICC line Foley:  N/A Code Status:  full code Last date of multidisciplinary goals of care discussion: 2/1, patient was updated at bedside  Labs   CBC: Recent Labs  Lab 04/19/23 1411 04/20/23 1635 04/21/23 0428 04/22/23 0210 04/23/23 0436  WBC 6.6 6.5 5.5 6.3 6.8  NEUTROABS 4.4  --   --   --   --   HGB 11.7* 11.8* 12.7* 12.8* 12.8*  HCT 35.4* 35.0* 35.8* 38.1* 38.9*  MCV 88.5 86.6 87.3 88.2 89.4  PLT 163 170 182 173 191    Basic Metabolic Panel: Recent Labs  Lab 04/19/23 1411 04/20/23 1635 04/21/23 0428 04/22/23 0210 04/23/23 0436  NA 137 141 137 137 136  K 4.0 3.9 4.0 3.4* 3.7  CL 104 106 100 103 105  CO2 24 25 26 24 24   GLUCOSE 127* 76 114* 166* 142*  BUN 16 11 11 16 14   CREATININE 1.05 1.13 1.05 1.22 1.13  CALCIUM 8.6* 8.8* 9.0 8.9 8.7*  MG  --  2.0 2.2 2.0 2.0  PHOS  --  3.9  --   --   --    GFR: Estimated Creatinine Clearance: 92.2 mL/min (by C-G formula based on SCr of 1.13 mg/dL). Recent Labs  Lab 04/20/23 1635 04/20/23 1700 04/21/23 0428 04/22/23 0210 04/23/23 0436  WBC 6.5  --  5.5 6.3 6.8  LATICACIDVEN  --  1.1  --   --   --     Liver Function Tests: Recent Labs  Lab  04/19/23 1411 04/20/23 1635  AST 31 30  ALT 53* 49*  ALKPHOS 72 75  BILITOT 0.7 0.9  PROT 7.0 6.4*  ALBUMIN 4.2 3.9   No results for input(s): "LIPASE", "AMYLASE" in the last 168 hours. No results for input(s): "AMMONIA" in the last 168 hours.  ABG No results found for: "PHART", "PCO2ART", "PO2ART", "HCO3", "TCO2", "ACIDBASEDEF", "O2SAT"   Coagulation Profile: Recent Labs  Lab 04/19/23 1411 04/20/23 2106 04/21/23 0428 04/22/23 0210 04/23/23 0436  INR 2.5* 2.0* 1.8* 1.5* 1.2     Cardiac Enzymes: No results for input(s): "CKTOTAL", "CKMB", "CKMBINDEX", "TROPONINI" in the last 168 hours.  HbA1C: No results found for: "HGBA1C"  CBG: No results for input(s): "GLUCAP" in the last 168 hours.     Cheri Fowler, MD [facility] See Amion for pager If no response to pager, please call [phone] until 7pm After 7pm, Please call E-link [phone]

## 2023-04-23 NOTE — Progress Notes (Signed)
  Progress Note   Patient: Chase Lee IRC:[id number] DOB: 12/25/1980 DOA: 04/20/2023     3 DOS: the patient was seen and examined on 04/23/2023   Brief hospital course:  43 year old man w/ hx of anxiety, chronic pain, HTN, and aortic valve replacement x 2 on coumadin presenting with increasing chest pain, HA, and DOE. Workup has revealed an extremely large thoracic aneurysm as well as significant hypertension. He is going to be direct admitted for blood pressure control, coumadin washout and eventual bentall.  Assessment and Plan: Symptomatic ascending and aortic root aneurysm Paroxysmal A-fib Status post aortic valve replacement on Coumadin Hypertensive urgency Chronic pain/anxiety GERD Hypokalemia   TCTS following, patient will have surgery this Thursday. CT coronary  done results pending. Remain in sinus rhythm Continue IV heparin infusion INR Normalized. Continue labetalol 200 mg 3 times daily, blood pressure is controlled Off IV hypertensive meds Continue Xanax and Percocet which she takes at home Continue Protonix Continue aggressive electrolyte replacement      Subjective: Patient was seen and examined at bedside today.  Patient reports that the pain in the chest has significantly improved.  No events reported overnight.  Patient is scheduled for eye surgery this coming Thursday.  Physical Exam: Vitals:   04/23/23 0800 04/23/23 0900 04/23/23 1000 04/23/23 1100  BP: 114/72 122/75 110/75   Pulse: (!) 58 (!) 53 (!) 51   Resp: 19 14 17    Temp:    97.6 F (36.4 C)  TempSrc:    Axillary  SpO2: 94% 94% 92%   Weight:       General: Middle-aged male sitting on the bed HEENT: [place]/AT, eyes anicteric.  moist mucus membranes Neuro: Alert, awake following commands Chest: Coarse breath sounds, no wheezes or rhonchi Heart: Regular rate and rhythm, no murmurs or gallops Abdomen: Soft, nontender, nondistended, bowel sounds present Skin: No rash Data Reviewed:  INR  Normal.  Family Communication: Patient is alert and oriented  Disposition: Status is: Inpatient Remains inpatient appropriate because: Thoracic aortic aneurysm symptomatic  Planned Discharge Destination: Home with Home Health    Time spent: 35 minutes  Author: Harold Hedge, MD 04/23/2023 1:04 PM  For on call review [email or web address].

## 2023-04-23 NOTE — Progress Notes (Signed)
PHARMACY - ANTICOAGULATION CONSULT NOTE  Pharmacy Consult for heparin Indication:  Mechanical AVR  No Known Allergies  Patient Measurements: Weight: 101.8 kg (224 lb 6.9 oz) Heparin Dosing Weight: 88kg  Vital Signs: Temp: 97.6 F (36.4 C) (02/03 1100) Temp Source: Axillary (02/03 1100) BP: 110/75 (02/03 1000) Pulse Rate: 59 (02/03 1330)  Labs: Recent Labs    04/21/23 0428 04/21/23 1643 04/22/23 0210 04/22/23 1139 04/23/23 0436  HGB 12.7*  --  12.8*  --  12.8*  HCT 35.8*  --  38.1*  --  38.9*  PLT 182  --  173  --  191  LABPROT 21.5*  --  18.0*  --  15.8*  INR 1.8*  --  1.5*  --  1.2  HEPARINUNFRC  --    < > 0.26* 0.44 0.42  CREATININE 1.05  --  1.22  --  1.13   < > = values in this interval not displayed.    Estimated Creatinine Clearance: 92.2 mL/min (by C-G formula based on SCr of 1.13 mg/dL).   Medical History: Past Medical History:  Diagnosis Date   Anxiety    History of artificial heart valve 2008   surgery at 25   HTN (hypertension) 04/05/2023      Assessment: 43yom with Hx mechanical AVR on warfarin pta INR 2.5 on admit - hold warfarin for planned procedure  INR < 2 now will bridge with heparin drip  Cbc stable   Heparin drip rate 1700 uts/hr with heparin level 0.42 at goal. No issues with infusion or overt s/sx of bleeding reported. CTA coronary scheduled for Monday 2/3  Goal of Therapy:  Heparin level 0.3-0.7 units/ml Monitor platelets by anticoagulation protocol: Yes   Plan:  Continue heparin drip 1700 uts/hr  Daily heparin level and CBC  Monitor s/s bleeding   Reece Leader, Colon Flattery, [facility] Clinical Pharmacist  04/23/2023 2:46 PM   [facility] pharmacy phone numbers are listed on [email or web address]

## 2023-04-23 NOTE — Progress Notes (Signed)
Pt has been walking with staff and wife. Discussed with pt IS (2500 ml), sternal precautions, mobility post op, and d/c planning. Pt receptive. His dad will be with him after d/c while his wife is working. Gave pt OHS booklet, careguide, and preop video. Encouraged continued ambulation. [id number] [name] BS, ACSM-CEP 04/23/2023 9:24 AM

## 2023-04-23 NOTE — Progress Notes (Signed)
Procedure(s) (LRB): REDO STERNOTOMY (N/A) BENTALL PROCEDURE (N/A) REPLACEMENT ASCENDING AORTIC ANEURYSM (N/A) TRANSESOPHAGEAL ECHOCARDIOGRAM (TEE) (N/A) Subjective:  Occasional pains in left shoulder but otherwise feels much better with BP under good control.  Ambulating.   Objective: Vital signs in last 24 hours: Temp:  [97.3 F (36.3 C)-98.2 F (36.8 C)] 98.2 F (36.8 C) (02/03 1500) Pulse Rate:  [49-66] 56 (02/03 1600) Cardiac Rhythm: Sinus bradycardia (02/03 0800) Resp:  [11-22] 13 (02/03 1600) BP: (83-155)/(57-134) 112/72 (02/03 1600) SpO2:  [88 %-96 %] 95 % (02/03 1600) Weight:  [101.8 kg] 101.8 kg (02/03 0734)  Hemodynamic parameters for last 24 hours:    Intake/Output from previous day: 02/02 0701 - 02/03 0700 In: 724.3 [P.O.:360; I.V.:364.3] Out: 252 [Urine:251; Stool:1] Intake/Output this shift: Total I/O In: 417.2 [P.O.:300; I.V.:117.2] Out: -   General appearance: alert and cooperative Heart: regular rate and rhythm, mechanical valve click. Lungs: clear to auscultation bilaterally  Lab Results: Recent Labs    04/22/23 0210 04/23/23 0436  WBC 6.3 6.8  HGB 12.8* 12.8*  HCT 38.1* 38.9*  PLT 173 191   BMET:  Recent Labs    04/22/23 0210 04/23/23 0436  NA 137 136  K 3.4* 3.7  CL 103 105  CO2 24 24  GLUCOSE 166* 142*  BUN 16 14  CREATININE 1.22 1.13  CALCIUM 8.9 8.7*    PT/INR:  Recent Labs    04/23/23 0436  LABPROT 15.8*  INR 1.2   ABG No results found for: "PHART", "HCO3", "TCO2", "ACIDBASEDEF", "O2SAT" CBG (last 3)  No results for input(s): "GLUCAP" in the last 72 hours.  Assessment/Plan:  Cardiac CT reviewed. There is no significant coronary disease. There are two focal dissections that I saw on his prior chest CTA from 04/19/23 that radiology did not call but they look the same today. One is near the left main extending posteriorly and the other in the mid ascending along the greater curve. Both are focal and I suspect are  chronic since there is no surrounding hematoma or edema. There is no pericardial effusion. Will continue tight BP control with labetalol to keep SBP< 120 and heparin for mechanical valve. Plan surgical repair Thursday.  LOS: 3 days    Alleen Borne 04/23/2023

## 2023-04-24 DIAGNOSIS — I4891 Unspecified atrial fibrillation: Secondary | ICD-10-CM | POA: Diagnosis not present

## 2023-04-24 DIAGNOSIS — I160 Hypertensive urgency: Secondary | ICD-10-CM | POA: Diagnosis not present

## 2023-04-24 DIAGNOSIS — I77810 Thoracic aortic ectasia: Secondary | ICD-10-CM | POA: Diagnosis not present

## 2023-04-24 DIAGNOSIS — K219 Gastro-esophageal reflux disease without esophagitis: Secondary | ICD-10-CM | POA: Diagnosis not present

## 2023-04-24 LAB — BASIC METABOLIC PANEL
Anion gap: 9 (ref 5–15)
BUN: 14 mg/dL (ref 6–20)
CO2: 25 mmol/L (ref 22–32)
Calcium: 9.2 mg/dL (ref 8.9–10.3)
Chloride: 104 mmol/L (ref 98–111)
Creatinine, Ser: 1.28 mg/dL — ABNORMAL HIGH (ref 0.61–1.24)
GFR, Estimated: >60 mL/min (ref >60)
Glucose, Bld: 109 mg/dL — ABNORMAL HIGH (ref 70–99)
Potassium: 4.2 mmol/L (ref 3.5–5.1)
Sodium: 138 mmol/L (ref 135–145)

## 2023-04-24 LAB — HEPARIN LEVEL (UNFRACTIONATED): Heparin Unfractionated: 0.64 [IU]/mL (ref 0.30–0.70)

## 2023-04-24 LAB — PROTIME-INR
INR: 1.1 (ref 0.8–1.2)
Prothrombin Time: 14.2 s (ref 11.4–15.2)

## 2023-04-24 LAB — MAGNESIUM: Magnesium: 2.1 mg/dL (ref 1.7–2.4)

## 2023-04-24 LAB — CBC
HCT: 42.4 % (ref 39.0–52.0)
Hemoglobin: 13.7 g/dL (ref 13.0–17.0)
MCH: 29.4 pg (ref 26.0–34.0)
MCHC: 32.3 g/dL (ref 30.0–36.0)
MCV: 91 fL (ref 80.0–100.0)
Platelets: 204 10*3/uL (ref 150–400)
RBC: 4.66 MIL/uL (ref 4.22–5.81)
RDW: 14.7 % (ref 11.5–15.5)
WBC: 7 10*3/uL (ref 4.0–10.5)
nRBC: 0 % (ref 0.0–0.2)

## 2023-04-24 NOTE — Progress Notes (Addendum)
 NAME:  Chase Lee, MRN:  [id number], DOB:  03-May-1980, LOS: 4 ADMISSION DATE:  04/20/2023, CONSULTATION DATE:  04/19/22 REFERRING MD:  Lucas, CHIEF COMPLAINT:  HA   History of Present Illness:  43 year old man w/ hx of anxiety, chronic pain, HTN, and aortic valve replacement x 2 on coumadin  presenting with increasing chest pain, HA, and DOE.  Workup has revealed an extremely large thoracic aneurysm as well as significant hypertension.  He is going to be direct admitted for blood pressure control, coumadin  washout and eventual bentall.  Pertinent  Medical History  HTN Bicuspid aortic valve Anxiety Chronic pain previously on suboxone  Significant Hospital Events: Including procedures, antibiotic start and stop dates in addition to other pertinent events   1/31 admit  Interim History / Subjective:  No overnight issues Blood pressure is controlled  Objective   Examination: General: Middle-age male, lying on the bed HEENT: [place]/AT, eyes anicteric.  moist mucus membranes Neuro: Alert, awake following commands Chest: Coarse breath sounds, no wheezes or rhonchi Heart: Regular rate and rhythm, no murmurs or gallops Abdomen: Soft, nontender, nondistended, bowel sounds present Skin: No rash  Labs and images reviewed  Resolved Hospital Problem list   Hypokalemia  Assessment & Plan:  Symptomatic ascending and aortic root aneurysm Paroxysmal A-fib Status post aortic valve replacement on Coumadin  Hypertensive urgency Chronic pain/anxiety GERD  Coronary CT angiogram was done, showing chronic aortic dissection Coronaries looked patent Patient is scheduled for bental procedure on Thursday Remain in sinus rhythm Continue IV heparin  infusion Blood pressure is controlled on labetalol  Continue oxycodone  and Xanax  Continue Protonix    Best Practice (right click and Reselect all SmartList Selections daily)   Diet/type: Regular consistency (see orders) DVT prophylaxis: IV heparin   infusion Pressure ulcer(s): N/A GI prophylaxis: PPI Lines: PICC line Foley:  N/A Code Status:  full code Last date of multidisciplinary goals of care discussion: 2/1, patient was updated at bedside  Labs   CBC: Recent Labs  Lab 04/19/23 1411 04/20/23 1635 04/21/23 0428 04/22/23 0210 04/23/23 0436 04/24/23 0507  WBC 6.6 6.5 5.5 6.3 6.8 7.0  NEUTROABS 4.4  --   --   --   --   --   HGB 11.7* 11.8* 12.7* 12.8* 12.8* 13.7  HCT 35.4* 35.0* 35.8* 38.1* 38.9* 42.4  MCV 88.5 86.6 87.3 88.2 89.4 91.0  PLT 163 170 182 173 191 204    Basic Metabolic Panel: Recent Labs  Lab 04/20/23 1635 04/21/23 0428 04/22/23 0210 04/23/23 0436 04/24/23 0507  NA 141 137 137 136 138  K 3.9 4.0 3.4* 3.7 4.2  CL 106 100 103 105 104  CO2 25 26 24 24 25   GLUCOSE 76 114* 166* 142* 109*  BUN 11 11 16 14 14   CREATININE 1.13 1.05 1.22 1.13 1.28*  CALCIUM  8.8* 9.0 8.9 8.7* 9.2  MG 2.0 2.2 2.0 2.0 2.1  PHOS 3.9  --   --   --   --    GFR: Estimated Creatinine Clearance: 81.6 mL/min (A) (by C-G formula based on SCr of 1.28 mg/dL (H)). Recent Labs  Lab 04/20/23 1700 04/21/23 0428 04/22/23 0210 04/23/23 0436 04/24/23 0507  WBC  --  5.5 6.3 6.8 7.0  LATICACIDVEN 1.1  --   --   --   --     Liver Function Tests: Recent Labs  Lab 04/19/23 1411 04/20/23 1635  AST 31 30  ALT 53* 49*  ALKPHOS 72 75  BILITOT 0.7 0.9  PROT 7.0 6.4*  ALBUMIN  4.2 3.9   No results for input(s): LIPASE, AMYLASE in the last 168 hours. No results for input(s): AMMONIA in the last 168 hours.  ABG No results found for: PHART, PCO2ART, PO2ART, HCO3, TCO2, ACIDBASEDEF, O2SAT   Coagulation Profile: Recent Labs  Lab 04/20/23 2106 04/21/23 0428 04/22/23 0210 04/23/23 0436 04/24/23 0507  INR 2.0* 1.8* 1.5* 1.2 1.1    Cardiac Enzymes: No results for input(s): CKTOTAL, CKMB, CKMBINDEX, TROPONINI in the last 168 hours.  HbA1C: No results found for: HGBA1C  CBG: No results for  input(s): GLUCAP in the last 168 hours.     Valinda Novas, MD [place] [facility] See Amion for pager If no response to pager, please call [phone] until 7pm After 7pm, Please call E-link [phone]

## 2023-04-24 NOTE — Plan of Care (Signed)
  Problem: Education: Goal: Knowledge of General Education information will improve Description: Including pain rating scale, medication(s)/side effects and non-pharmacologic comfort measures Outcome: Progressing   Problem: Health Behavior/Discharge Planning: Goal: Ability to manage health-related needs will improve Outcome: Progressing   Problem: Clinical Measurements: Goal: Will remain free from infection Outcome: Progressing Goal: Cardiovascular complication will be avoided Outcome: Progressing   Problem: Nutrition: Goal: Adequate nutrition will be maintained Outcome: Progressing   Problem: Coping: Goal: Level of anxiety will decrease Outcome: Progressing   Problem: Elimination: Goal: Will not experience complications related to urinary retention Outcome: Progressing

## 2023-04-24 NOTE — Progress Notes (Signed)
 PHARMACY - ANTICOAGULATION CONSULT NOTE  Pharmacy Consult for heparin  Indication:  Mechanical AVR  No Known Allergies  Patient Measurements: Weight: 102.3 kg (225 lb 8.5 oz) Heparin  Dosing Weight: 88kg  Vital Signs: Temp: 98 F (36.7 C) (02/04 0400) Temp Source: Oral (02/04 0400) BP: 103/68 (02/04 1200) Pulse Rate: 53 (02/04 1200)  Labs: Recent Labs    04/22/23 0210 04/22/23 1139 04/23/23 0436 04/24/23 0507  HGB 12.8*  --  12.8* 13.7  HCT 38.1*  --  38.9* 42.4  PLT 173  --  191 204  LABPROT 18.0*  --  15.8* 14.2  INR 1.5*  --  1.2 1.1  HEPARINUNFRC 0.26* 0.44 0.42 0.64  CREATININE 1.22  --  1.13 1.28*    Estimated Creatinine Clearance: 81.6 mL/min (A) (by C-G formula based on SCr of 1.28 mg/dL (H)).   Medical History: Past Medical History:  Diagnosis Date   Anxiety    History of artificial heart valve 2008   surgery at 25   HTN (hypertension) 04/05/2023     Assessment: 43yom with Hx mechanical AVR on warfarin pta INR 2.5 on admit - hold warfarin for planned procedure  INR < 2 now will bridge with heparin  drip  Cbc stable   Heparin  drip rate 1700 uts/hr with heparin  level 0.64 at goal. No issues with infusion or overt s/sx of bleeding reported. Surgical repair planned 2/6 of thoracic aneurysm.  Goal of Therapy:  Heparin  level 0.3-0.7 units/ml Monitor platelets by anticoagulation protocol: Yes   Plan:  Continue heparin  drip 1700 uts/hr  Daily heparin  level and CBC  Monitor s/s bleeding   Harlene Barlow, [employer], BCCP Clinical Pharmacist  04/24/2023 1:49 PM   [facility] pharmacy phone numbers are listed on [email or web address]

## 2023-04-24 NOTE — Progress Notes (Signed)
      [address]       [place] [address]             [phone]      Up in chair, no complaints  BP (!) 118/90   Pulse 61   Temp (!) 97.5 F (36.4 C) (Oral)   Resp 12   Wt 102.3 kg   SpO2 95%   BMI 36.40 kg/m    Intake/Output Summary (Last 24 hours) at 04/24/2023 1732 Last data filed at 04/24/2023 1600 Gross per 24 hour  Intake 625.79 ml  Output 1075 ml  Net -449.21 ml   For aneurysm repair Thursday  Kelse Ploch C. Kerrin, MD [facility] [phone]

## 2023-04-25 DIAGNOSIS — I712 Thoracic aortic aneurysm, without rupture, unspecified: Secondary | ICD-10-CM | POA: Diagnosis not present

## 2023-04-25 DIAGNOSIS — I440 Atrioventricular block, first degree: Secondary | ICD-10-CM | POA: Diagnosis not present

## 2023-04-25 DIAGNOSIS — I77810 Thoracic aortic ectasia: Secondary | ICD-10-CM | POA: Diagnosis not present

## 2023-04-25 LAB — CBC
HCT: 41.2 % (ref 39.0–52.0)
Hemoglobin: 13.4 g/dL (ref 13.0–17.0)
MCH: 29.3 pg (ref 26.0–34.0)
MCHC: 32.5 g/dL (ref 30.0–36.0)
MCV: 90.2 fL (ref 80.0–100.0)
Platelets: 196 10*3/uL (ref 150–400)
RBC: 4.57 MIL/uL (ref 4.22–5.81)
RDW: 14.7 % (ref 11.5–15.5)
WBC: 7.6 10*3/uL (ref 4.0–10.5)
nRBC: 0 % (ref 0.0–0.2)

## 2023-04-25 LAB — BASIC METABOLIC PANEL
Anion gap: 9 (ref 5–15)
BUN: 17 mg/dL (ref 6–20)
CO2: 26 mmol/L (ref 22–32)
Calcium: 9.5 mg/dL (ref 8.9–10.3)
Chloride: 100 mmol/L (ref 98–111)
Creatinine, Ser: 1.06 mg/dL (ref 0.61–1.24)
GFR, Estimated: >60 mL/min (ref >60)
Glucose, Bld: 115 mg/dL — ABNORMAL HIGH (ref 70–99)
Potassium: 4 mmol/L (ref 3.5–5.1)
Sodium: 135 mmol/L (ref 135–145)

## 2023-04-25 LAB — PROTIME-INR
INR: 1 (ref 0.8–1.2)
Prothrombin Time: 13.4 s (ref 11.4–15.2)

## 2023-04-25 LAB — PREPARE RBC (CROSSMATCH)

## 2023-04-25 LAB — HEPARIN LEVEL (UNFRACTIONATED)
Heparin Unfractionated: 0.86 [IU]/mL — ABNORMAL HIGH (ref 0.30–0.70)
Heparin Unfractionated: 0.62 [IU]/mL (ref 0.30–0.70)

## 2023-04-25 LAB — MAGNESIUM: Magnesium: 2 mg/dL (ref 1.7–2.4)

## 2023-04-25 MED ORDER — NITROGLYCERIN IN D5W 200-5 MCG/ML-% IV SOLN
2.0000 ug/min | INTRAVENOUS | Status: DC
  Filled 2023-04-25: qty 250

## 2023-04-25 MED ORDER — TRANEXAMIC ACID (OHS) BOLUS VIA INFUSION
15.0000 mg/kg | INTRAVENOUS | Status: AC
  Administered 2023-04-26: 1524 mg via INTRAVENOUS
  Filled 2023-04-25: qty 1524

## 2023-04-25 MED ORDER — METOPROLOL TARTRATE 12.5 MG HALF TABLET
12.5000 mg | ORAL_TABLET | Freq: Once | ORAL | Status: AC
  Administered 2023-04-26: 12.5 mg via ORAL
  Filled 2023-04-25: qty 1

## 2023-04-25 MED ORDER — PHENYLEPHRINE HCL-NACL 20-0.9 MG/250ML-% IV SOLN
30.0000 ug/min | INTRAVENOUS | Status: AC
  Administered 2023-04-26: 10 ug/min via INTRAVENOUS
  Filled 2023-04-25: qty 250

## 2023-04-25 MED ORDER — MILRINONE LACTATE IN DEXTROSE 20-5 MG/100ML-% IV SOLN
0.3000 ug/kg/min | INTRAVENOUS | Status: DC
  Filled 2023-04-25: qty 100

## 2023-04-25 MED ORDER — BISACODYL 5 MG PO TBEC
5.0000 mg | DELAYED_RELEASE_TABLET | Freq: Once | ORAL | Status: AC
  Administered 2023-04-25: 5 mg via ORAL
  Filled 2023-04-25: qty 1

## 2023-04-25 MED ORDER — CEFAZOLIN SODIUM-DEXTROSE 2-4 GM/100ML-% IV SOLN
2.0000 g | INTRAVENOUS | Status: AC
  Administered 2023-04-26 (×2): 2 g via INTRAVENOUS
  Filled 2023-04-25: qty 100

## 2023-04-25 MED ORDER — NOREPINEPHRINE 4 MG/250ML-% IV SOLN
0.0000 ug/min | INTRAVENOUS | Status: AC
  Filled 2023-04-25: qty 250

## 2023-04-25 MED ORDER — EPINEPHRINE HCL 5 MG/250ML IV SOLN IN NS
0.0000 ug/min | INTRAVENOUS | Status: DC
  Filled 2023-04-25: qty 250

## 2023-04-25 MED ORDER — PLASMA-LYTE A IV SOLN
INTRAVENOUS | Status: DC
  Filled 2023-04-25: qty 2.5

## 2023-04-25 MED ORDER — CHLORHEXIDINE GLUCONATE CLOTH 2 % EX PADS
6.0000 | MEDICATED_PAD | Freq: Once | CUTANEOUS | Status: AC
  Administered 2023-04-25: 6 via TOPICAL

## 2023-04-25 MED ORDER — POTASSIUM CHLORIDE 2 MEQ/ML IV SOLN
80.0000 meq | INTRAVENOUS | Status: DC
  Filled 2023-04-25: qty 40

## 2023-04-25 MED ORDER — TRANEXAMIC ACID (OHS) PUMP PRIME SOLUTION
2.0000 mg/kg | INTRAVENOUS | Status: DC
  Filled 2023-04-25: qty 2.03

## 2023-04-25 MED ORDER — DIAZEPAM 5 MG PO TABS: 5.0000 mg | ORAL_TABLET | Freq: Once | ORAL | Status: DC

## 2023-04-25 MED ORDER — DEXMEDETOMIDINE HCL IN NACL 400 MCG/100ML IV SOLN
0.1000 ug/kg/h | INTRAVENOUS | Status: AC
  Administered 2023-04-26: .5 ug/kg/h via INTRAVENOUS
  Filled 2023-04-25 (×2): qty 100

## 2023-04-25 MED ORDER — HEPARIN 30,000 UNITS/1000 ML (OHS) CELLSAVER SOLUTION
Status: DC
  Filled 2023-04-25: qty 1000

## 2023-04-25 MED ORDER — TEMAZEPAM 15 MG PO CAPS
15.0000 mg | ORAL_CAPSULE | Freq: Once | ORAL | Status: AC | PRN
  Administered 2023-04-25: 15 mg via ORAL
  Filled 2023-04-25: qty 1

## 2023-04-25 MED ORDER — TRANEXAMIC ACID 1000 MG/10ML IV SOLN
1.5000 mg/kg/h | INTRAVENOUS | Status: AC
  Administered 2023-04-26 (×3): 1.5 mg/kg/h via INTRAVENOUS
  Filled 2023-04-25 (×2): qty 25

## 2023-04-25 MED ORDER — CHLORHEXIDINE GLUCONATE 0.12 % MT SOLN
15.0000 mL | Freq: Once | OROMUCOSAL | Status: AC
  Administered 2023-04-26: 15 mL via OROMUCOSAL

## 2023-04-25 MED ORDER — AMIODARONE IV BOLUS ONLY 150 MG/100ML
150.0000 mg | Freq: Once | INTRAVENOUS | Status: AC
  Administered 2023-04-25: 150 mg via INTRAVENOUS
  Filled 2023-04-25: qty 100

## 2023-04-25 MED ORDER — CEFAZOLIN SODIUM-DEXTROSE 2-4 GM/100ML-% IV SOLN
2.0000 g | INTRAVENOUS | Status: DC
  Filled 2023-04-25: qty 100

## 2023-04-25 MED ORDER — INSULIN REGULAR(HUMAN) IN NACL 100-0.9 UT/100ML-% IV SOLN
INTRAVENOUS | Status: AC
  Administered 2023-04-26: 1.7 [IU]/h via INTRAVENOUS
  Filled 2023-04-25: qty 100

## 2023-04-25 MED ORDER — MANNITOL 20 % IV SOLN
INTRAVENOUS | Status: DC
  Filled 2023-04-25: qty 13

## 2023-04-25 MED ORDER — CHLORHEXIDINE GLUCONATE CLOTH 2 % EX PADS: 6.0000 | MEDICATED_PAD | Freq: Once | CUTANEOUS | Status: AC

## 2023-04-25 MED ORDER — AMIODARONE HCL IN DEXTROSE 360-4.14 MG/200ML-% IV SOLN
INTRAVENOUS | Status: AC
  Filled 2023-04-25: qty 200

## 2023-04-25 MED ORDER — VANCOMYCIN HCL 1.5 G IV SOLR
1500.0000 mg | INTRAVENOUS | Status: AC
  Administered 2023-04-26: 1250 mg via INTRAVENOUS
  Filled 2023-04-25: qty 30

## 2023-04-25 NOTE — Progress Notes (Addendum)
 NAME:  Geraldo Haris, MRN:  [id number], DOB:  01-23-1981, LOS: 5 ADMISSION DATE:  04/20/2023, CONSULTATION DATE:  04/19/22 REFERRING MD:  Lucas, CHIEF COMPLAINT:  HA   History of Present Illness:  43 year old man w/ hx of anxiety, chronic pain, HTN, and aortic valve replacement x 2 on coumadin  presenting with increasing chest pain, HA, and DOE.  Workup has revealed an extremely large thoracic aneurysm as well as significant hypertension.  He is going to be direct admitted for blood pressure control, coumadin  washout and eventual bentall.  Pertinent  Medical History  HTN Bicuspid aortic valve Anxiety Chronic pain previously on suboxone  Significant Hospital Events: Including procedures, antibiotic start and stop dates in addition to other pertinent events   1/31 admit  Interim History / Subjective:  Patient is complaining of intermittent palpitations this morning Telemetry showed sinus tachycardia and bradycardia with intermittent first-degree AV block EKG was done which showed A-fib with heart rate 120s, with left bundle branch block which is chronic Stated he had chest pain this morning but denies at this time  Objective   Examination: General: Middle-age male, sitting on recliner HEENT: [place]/AT, eyes anicteric.  moist mucus membranes Neuro: Alert, awake following commands Chest: Coarse breath sounds, no wheezes or rhonchi Heart: Tachycardic, regular rhythm, no murmurs or gallops Abdomen: Soft, nontender, nondistended, bowel sounds present Skin: No rash  Labs and images reviewed  Resolved Hospital Problem list   Hypokalemia  Assessment & Plan:  Symptomatic ascending and aortic root aneurysm Paroxysmal A-fib Sinus tachybrady with first-degree AV block Status post aortic valve replacement on Coumadin  Hypertensive urgency Chronic pain/anxiety GERD  Coronary CT angiogram was done, showing chronic aortic dissection Coronaries looked patent Patient complained of chest pain  and palpitation this morning, telemetry showed having rhythm changes with sinus bradycardia, tachycardia and first-degree AV block EKG was done which showed A-fib with heart rate in 120s with left bundle branch block, which is chronic Patient is scheduled for repair of ascending aortic aneurysm surgery tomorrow We will give him 1 dose of amiodarone  Continue labetalol  200 mg 3 times daily Blood pressure has been well-controlled Continue IV heparin  infusion Continue oxycodone  and Xanax  Continue Protonix    Best Practice (right click and Reselect all SmartList Selections daily)   Diet/type: Regular consistency (see orders) DVT prophylaxis: IV heparin  infusion Pressure ulcer(s): N/A GI prophylaxis: PPI Lines: PICC line Foley:  N/A Code Status:  full code Last date of multidisciplinary goals of care discussion: 2/1, patient was updated at bedside  Labs   CBC: Recent Labs  Lab 04/19/23 1411 04/20/23 1635 04/21/23 0428 04/22/23 0210 04/23/23 0436 04/24/23 0507 04/25/23 0434  WBC 6.6   < > 5.5 6.3 6.8 7.0 7.6  NEUTROABS 4.4  --   --   --   --   --   --   HGB 11.7*   < > 12.7* 12.8* 12.8* 13.7 13.4  HCT 35.4*   < > 35.8* 38.1* 38.9* 42.4 41.2  MCV 88.5   < > 87.3 88.2 89.4 91.0 90.2  PLT 163   < > 182 173 191 204 196   < > = values in this interval not displayed.    Basic Metabolic Panel: Recent Labs  Lab 04/20/23 1635 04/21/23 0428 04/22/23 0210 04/23/23 0436 04/24/23 0507 04/25/23 0434  NA 141 137 137 136 138 135  K 3.9 4.0 3.4* 3.7 4.2 4.0  CL 106 100 103 105 104 100  CO2 25 26 24 24 25  26  GLUCOSE 76 114* 166* 142* 109* 115*  BUN 11 11 16 14 14 17   CREATININE 1.13 1.05 1.22 1.13 1.28* 1.06  CALCIUM  8.8* 9.0 8.9 8.7* 9.2 9.5  MG 2.0 2.2 2.0 2.0 2.1 2.0  PHOS 3.9  --   --   --   --   --    GFR: Estimated Creatinine Clearance: 98.2 mL/min (by C-G formula based on SCr of 1.06 mg/dL). Recent Labs  Lab 04/20/23 1700 04/21/23 0428 04/22/23 0210 04/23/23 0436  04/24/23 0507 04/25/23 0434  WBC  --    < > 6.3 6.8 7.0 7.6  LATICACIDVEN 1.1  --   --   --   --   --    < > = values in this interval not displayed.    Liver Function Tests: Recent Labs  Lab 04/19/23 1411 04/20/23 1635  AST 31 30  ALT 53* 49*  ALKPHOS 72 75  BILITOT 0.7 0.9  PROT 7.0 6.4*  ALBUMIN  4.2 3.9   No results for input(s): LIPASE, AMYLASE in the last 168 hours. No results for input(s): AMMONIA in the last 168 hours.  ABG No results found for: PHART, PCO2ART, PO2ART, HCO3, TCO2, ACIDBASEDEF, O2SAT   Coagulation Profile: Recent Labs  Lab 04/21/23 0428 04/22/23 0210 04/23/23 0436 04/24/23 0507 04/25/23 0434  INR 1.8* 1.5* 1.2 1.1 1.0    Cardiac Enzymes: No results for input(s): CKTOTAL, CKMB, CKMBINDEX, TROPONINI in the last 168 hours.  HbA1C: No results found for: HGBA1C  CBG: No results for input(s): GLUCAP in the last 168 hours.     Valinda Novas, MD  Pulmonary Critical Care See Amion for pager If no response to pager, please call [phone] until 7pm After 7pm, Please call E-link [phone]

## 2023-04-25 NOTE — Progress Notes (Signed)
 Procedure(s) (LRB): REDO STERNOTOMY (N/A) BENTALL PROCEDURE (N/A) REPLACEMENT ASCENDING AORTIC ANEURYSM (N/A) TRANSESOPHAGEAL ECHOCARDIOGRAM (TEE) (N/A) Subjective:  Feels ok. Has had same vague left sided chest pain that he has had for months. Ambulating.  Objective: Vital signs in last 24 hours: Temp:  [97.5 F (36.4 C)-98 F (36.7 C)] 97.9 F (36.6 C) (02/05 0400) Pulse Rate:  [49-64] 56 (02/05 0700) Resp:  [12-14] 12 (02/04 0842) BP: (93-126)/(55-90) 101/76 (02/05 0700) SpO2:  [88 %-97 %] 95 % (02/05 0700) Weight:  [101.6 kg] 101.6 kg (02/05 0433)  Hemodynamic parameters for last 24 hours:    Intake/Output from previous day: 02/04 0701 - 02/05 0700 In: 685.8 [P.O.:240; I.V.:445.8] Out: 1425 [Urine:1425] Intake/Output this shift: No intake/output data recorded.  General appearance: alert and cooperative Neurologic: intact Heart: regular rate and rhythm Lungs: clear to auscultation bilaterally Extremities: no edema   Lab Results: Recent Labs    04/24/23 0507 04/25/23 0434  WBC 7.0 7.6  HGB 13.7 13.4  HCT 42.4 41.2  PLT 204 196   BMET:  Recent Labs    04/24/23 0507 04/25/23 0434  NA 138 135  K 4.2 4.0  CL 104 100  CO2 25 26  GLUCOSE 109* 115*  BUN 14 17  CREATININE 1.28* 1.06  CALCIUM  9.2 9.5    PT/INR:  Recent Labs    04/25/23 0434  LABPROT 13.4  INR 1.0   ABG No results found for: PHART, HCO3, TCO2, ACIDBASEDEF, O2SAT CBG (last 3)  No results for input(s): GLUCAP in the last 72 hours.  Assessment/Plan: S/P Procedure(s) (LRB): REDO STERNOTOMY (N/A) BENTALL PROCEDURE (N/A) REPLACEMENT ASCENDING AORTIC ANEURYSM (N/A) TRANSESOPHAGEAL ECHOCARDIOGRAM (TEE) (N/A)  He has been hemodynamically stable with good BP control on labetalol . Plan surgical repair of ascending aneurysm tomorrow. He will require bilateral radial arterial lines. Right axillary artery cannulation, long femoral venous cannulation. Hypothermic circulatory  arrest.  I discussed the operative procedure with the patient  including alternatives, benefits and risks; including but not limited to bleeding, blood transfusion, infection, stroke, myocardial infarction, graft failure, heart block requiring a permanent pacemaker, organ dysfunction, and death.  Venetia Seip understands and agrees to proceed.       LOS: 5 days    Dorise MARLA Fellers 04/25/2023

## 2023-04-25 NOTE — Anesthesia Preprocedure Evaluation (Addendum)
 Anesthesia Evaluation  Patient identified by MRN, date of birth, ID band Patient awake    Reviewed: Allergy & Precautions, NPO status , Patient's Chart, lab work & pertinent test results  History of Anesthesia Complications Negative for: history of anesthetic complications  Airway Mallampati: II  TM Distance: >3 FB Neck ROM: Full    Dental  (+) Dental Advisory Given   Pulmonary Current Smoker and Patient abstained from smoking.   Pulmonary exam normal        Cardiovascular hypertension, Pt. on medications + Peripheral Vascular Disease  Normal cardiovascular exam+ Valvular Problems/Murmurs    '25 Coronary CT - MPRESSION: 1. 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 There is also an ascending aorta dissection, appears to be a separate entry point at mid ascending aorta along greater curvature, proximal and distal extent both appear in the mid ascending aorta.  2. Severely dilated ascending aortic aneurysm, 74 mm in maximum visualized dimension at the mid ascending aorta. Dilated aortic root with effacement of ST junction.  3. 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 Distal vessels are small caliber and not well assessed.  4. Total plaque volume 16 mm3 which is 51st percentile for age- and sex-matched controls (calcified plaque 1 mm3; non-calcified plaque 15 mm3). TPV is mild.  5. Coronary calcium  score of 0.  6. Normal coronary origins with right dominance.  7. Mechanical aortic valve noted, grossly well seated with no apparent dehiscence.  '25 TTE - EF 60 to 65%. The right ventricular size is mildly enlarged. There is mildly elevated pulmonary artery systolic pressure. The estimated right ventricular systolic pressure is 42.9 mmHg. Left atrial  size was severely dilated. Mild AI. There is a unknown mechanical valve present in the aortic position. There is severe dilatation of the aortic root, measuring 62 mm. There is severe dilatation of the ascending aorta, measuring 77 mm.      Neuro/Psych  PSYCHIATRIC DISORDERS Anxiety     negative neurological ROS     GI/Hepatic Neg liver ROS,GERD  Medicated and Controlled,,  Endo/Other   Obesity   Renal/GU negative Renal ROS     Musculoskeletal negative musculoskeletal ROS (+)    Abdominal   Peds  Hematology  On coumadin     Anesthesia Other Findings   Reproductive/Obstetrics                             Anesthesia Physical Anesthesia Plan  ASA: 4  Anesthesia Plan: General   Post-op Pain Management:    Induction: Intravenous  PONV Risk Score and Plan: 2 and Treatment may vary due to age or medical condition  Airway Management Planned: Oral ETT  Additional Equipment: Arterial line, CVP, PA Cath, TEE and Ultrasound Guidance Line Placement  Intra-op Plan:   Post-operative Plan: Post-operative intubation/ventilation  Informed Consent: I have reviewed the patients History and Physical, chart, labs and discussed the procedure including the risks, benefits and alternatives for the proposed anesthesia with the patient or authorized representative who has indicated his/her understanding and acceptance.     Dental advisory given  Plan Discussed with: CRNA and Anesthesiologist  Anesthesia Plan Comments: (B/l a-line )       Anesthesia Quick Evaluation

## 2023-04-25 NOTE — Progress Notes (Signed)
 PHARMACY - ANTICOAGULATION CONSULT NOTE  Pharmacy Consult for heparin  Indication:  Mechanical AVR  No Known Allergies  Patient Measurements: Weight: 101.6 kg (223 lb 15.8 oz) Heparin  Dosing Weight: 88kg  Vital Signs: Temp: 97.9 F (36.6 C) (02/05 0400) Temp Source: Oral (02/05 0400) BP: 100/60 (02/05 0400) Pulse Rate: 52 (02/05 0500)  Labs: Recent Labs    04/23/23 0436 04/24/23 0507 04/25/23 0434  HGB 12.8* 13.7 13.4  HCT 38.9* 42.4 41.2  PLT 191 204 196  LABPROT 15.8* 14.2 13.4  INR 1.2 1.1 1.0  HEPARINUNFRC 0.42 0.64 0.86*  CREATININE 1.13 1.28* 1.06    Estimated Creatinine Clearance: 98.2 mL/min (by C-G formula based on SCr of 1.06 mg/dL).   Medical History: Past Medical History:  Diagnosis Date   Anxiety    History of artificial heart valve 2008   surgery at 25   HTN (hypertension) 04/05/2023     Assessment: 43yom with Hx mechanical AVR on warfarin pta INR 2.5 on admit - hold warfarin for planned procedure  INR < 2 now will bridge with heparin  drip  Cbc stable   Heparin  drip rate 1700 uts/hr with heparin  level 0.64> 0.86 above goal. No issues with infusion or overt s/sx of bleeding per RN. Surgical repair planned 2/6 of thoracic aneurysm.  Goal of Therapy:  Heparin  level 0.3-0.7 units/ml Monitor platelets by anticoagulation protocol: Yes   Plan:  Decrease heparin  drip 1600 uts/hr  8h heparin  level Daily heparin  level and CBC  Monitor s/s bleeding   Lynwood Poplar, PharmD. Clinical Pharmacist 04/25/2023 5:25 AM

## 2023-04-25 NOTE — Plan of Care (Signed)
  Problem: Education: Goal: Knowledge of General Education information will improve Description: Including pain rating scale, medication(s)/side effects and non-pharmacologic comfort measures Outcome: Progressing   Problem: Health Behavior/Discharge Planning: Goal: Ability to manage health-related needs will improve Outcome: Progressing   Problem: Clinical Measurements: Goal: Ability to maintain clinical measurements within normal limits will improve 04/25/2023 1901 by Claude Jerl LABOR, RN Outcome: Progressing 04/25/2023 1901 by Claude Jerl LABOR, RN Outcome: Progressing Goal: Will remain free from infection 04/25/2023 1901 by Claude Jerl LABOR, RN Outcome: Progressing 04/25/2023 1901 by Claude Jerl LABOR, RN Outcome: Progressing Goal: Diagnostic test results will improve 04/25/2023 1901 by Claude Jerl LABOR, RN Outcome: Progressing 04/25/2023 1901 by Claude Jerl LABOR, RN Outcome: Progressing Goal: Respiratory complications will improve Outcome: Progressing

## 2023-04-25 NOTE — Plan of Care (Signed)
   Problem: Clinical Measurements: Goal: Ability to maintain clinical measurements within normal limits will improve Outcome: Progressing Goal: Will remain free from infection Outcome: Progressing Goal: Diagnostic test results will improve Outcome: Progressing Goal: Respiratory complications will improve Outcome: Progressing

## 2023-04-25 NOTE — Progress Notes (Signed)
 PHARMACY - ANTICOAGULATION CONSULT NOTE  Pharmacy Consult for heparin  Indication:  Mechanical AVR  No Known Allergies  Patient Measurements: Weight: 101.6 kg (223 lb 15.8 oz) Heparin  Dosing Weight: 88kg  Vital Signs: Temp: 98.2 F (36.8 C) (02/05 1123) Temp Source: Oral (02/05 1123) BP: 103/75 (02/05 1300) Pulse Rate: 56 (02/05 1300)  Labs: Recent Labs    04/23/23 0436 04/24/23 0507 04/25/23 0434 04/25/23 1449  HGB 12.8* 13.7 13.4  --   HCT 38.9* 42.4 41.2  --   PLT 191 204 196  --   LABPROT 15.8* 14.2 13.4  --   INR 1.2 1.1 1.0  --   HEPARINUNFRC 0.42 0.64 0.86* 0.62  CREATININE 1.13 1.28* 1.06  --     Estimated Creatinine Clearance: 98.2 mL/min (by C-G formula based on SCr of 1.06 mg/dL).   Medical History: Past Medical History:  Diagnosis Date   Anxiety    History of artificial heart valve 2008   surgery at 25   HTN (hypertension) 04/05/2023     Assessment: 43yom with Hx mechanical AVR on warfarin pta INR 2.5 on admit - hold warfarin for planned procedure  INR < 2 now will bridge with heparin  drip  Cbc stable   Heparin  drip rate 1600 uts/hr with heparin  level 0.62 at goal. No issues with infusion or overt s/sx of bleeding reported. Surgical repair planned 2/6 of thoracic aneurysm.  Goal of Therapy:  Heparin  level 0.3-0.7 units/ml Monitor platelets by anticoagulation protocol: Yes   Plan:  Continue heparin  drip 1600 uts/hr  Daily heparin  level and CBC  Monitor s/s bleeding   Harlene Barlow, [employer], [facility] Clinical Pharmacist  04/25/2023 3:49 PM   [facility] pharmacy phone numbers are listed on [email or web address]

## 2023-04-26 ENCOUNTER — Inpatient Hospital Stay (HOSPITAL_COMMUNITY): Payer: Self-pay | Admitting: Anesthesiology

## 2023-04-26 ENCOUNTER — Inpatient Hospital Stay (HOSPITAL_COMMUNITY): Payer: 59

## 2023-04-26 ENCOUNTER — Encounter (HOSPITAL_COMMUNITY)
Admission: AD | Disposition: A | Discharge status: Home / Self Care | Payer: Self-pay | Source: Ambulatory Visit | Attending: Surgery

## 2023-04-26 ENCOUNTER — Encounter (HOSPITAL_COMMUNITY): Payer: Self-pay | Admitting: Internal Medicine

## 2023-04-26 ENCOUNTER — Encounter: Payer: No Typology Code available for payment source | Admitting: Surgery

## 2023-04-26 ENCOUNTER — Other Ambulatory Visit: Payer: Self-pay

## 2023-04-26 DIAGNOSIS — Q2381 Bicuspid aortic valve: Secondary | ICD-10-CM | POA: Diagnosis not present

## 2023-04-26 DIAGNOSIS — I712 Thoracic aortic aneurysm, without rupture, unspecified: Secondary | ICD-10-CM | POA: Diagnosis not present

## 2023-04-26 DIAGNOSIS — I10 Essential (primary) hypertension: Secondary | ICD-10-CM

## 2023-04-26 DIAGNOSIS — F419 Anxiety disorder, unspecified: Secondary | ICD-10-CM | POA: Diagnosis not present

## 2023-04-26 DIAGNOSIS — F17210 Nicotine dependence, cigarettes, uncomplicated: Secondary | ICD-10-CM

## 2023-04-26 HISTORY — PX: BENTALL PROCEDURE: SHX5058

## 2023-04-26 HISTORY — PX: REPLACEMENT ASCENDING AORTA: SHX6068

## 2023-04-26 HISTORY — PX: TEE WITHOUT CARDIOVERSION: SHX5443

## 2023-04-26 HISTORY — PX: REDO STERNOTOMY: SHX7393

## 2023-04-26 LAB — POCT I-STAT 7, (LYTES, BLD GAS, ICA,H+H)
Acid-base deficit: 2 mmol/L (ref 0.0–2.0)
Acid-base deficit: 2 mmol/L (ref 0.0–2.0)
Acid-base deficit: 2 mmol/L (ref 0.0–2.0)
Acid-base deficit: 1 mmol/L (ref 0.0–2.0)
Acid-base deficit: 2 mmol/L (ref 0.0–2.0)
Acid-base deficit: 1 mmol/L (ref 0.0–2.0)
Acid-base deficit: 3 mmol/L — ABNORMAL HIGH (ref 0.0–2.0)
Acid-base deficit: 3 mmol/L — ABNORMAL HIGH (ref 0.0–2.0)
Acid-base deficit: 7 mmol/L — ABNORMAL HIGH (ref 0.0–2.0)
Bicarbonate: 22.4 mmol/L (ref 20.0–28.0)
Bicarbonate: 22.4 mmol/L (ref 20.0–28.0)
Bicarbonate: 25.5 mmol/L (ref 20.0–28.0)
Bicarbonate: 23 mmol/L (ref 20.0–28.0)
Bicarbonate: 24.2 mmol/L (ref 20.0–28.0)
Bicarbonate: 25.4 mmol/L (ref 20.0–28.0)
Bicarbonate: 22.4 mmol/L (ref 20.0–28.0)
Bicarbonate: 23.1 mmol/L (ref 20.0–28.0)
Bicarbonate: 17.7 mmol/L — ABNORMAL LOW (ref 20.0–28.0)
Calcium, Ion: 0.96 mmol/L — ABNORMAL LOW (ref 1.15–1.40)
Calcium, Ion: 0.97 mmol/L — ABNORMAL LOW (ref 1.15–1.40)
Calcium, Ion: 1 mmol/L — ABNORMAL LOW (ref 1.15–1.40)
Calcium, Ion: 0.94 mmol/L — ABNORMAL LOW (ref 1.15–1.40)
Calcium, Ion: 1.15 mmol/L (ref 1.15–1.40)
Calcium, Ion: 1.11 mmol/L — ABNORMAL LOW (ref 1.15–1.40)
Calcium, Ion: 1.11 mmol/L — ABNORMAL LOW (ref 1.15–1.40)
Calcium, Ion: 1.1 mmol/L — ABNORMAL LOW (ref 1.15–1.40)
Calcium, Ion: 1 mmol/L — ABNORMAL LOW (ref 1.15–1.40)
HCT: 29 % — ABNORMAL LOW (ref 39.0–52.0)
HCT: 30 % — ABNORMAL LOW (ref 39.0–52.0)
HCT: 31 % — ABNORMAL LOW (ref 39.0–52.0)
HCT: 27 % — ABNORMAL LOW (ref 39.0–52.0)
HCT: 28 % — ABNORMAL LOW (ref 39.0–52.0)
HCT: 32 % — ABNORMAL LOW (ref 39.0–52.0)
HCT: 34 % — ABNORMAL LOW (ref 39.0–52.0)
HCT: 32 % — ABNORMAL LOW (ref 39.0–52.0)
HCT: 26 % — ABNORMAL LOW (ref 39.0–52.0)
Hemoglobin: 9.9 g/dL — ABNORMAL LOW (ref 13.0–17.0)
Hemoglobin: 10.2 g/dL — ABNORMAL LOW (ref 13.0–17.0)
Hemoglobin: 10.5 g/dL — ABNORMAL LOW (ref 13.0–17.0)
Hemoglobin: 9.2 g/dL — ABNORMAL LOW (ref 13.0–17.0)
Hemoglobin: 9.5 g/dL — ABNORMAL LOW (ref 13.0–17.0)
Hemoglobin: 10.9 g/dL — ABNORMAL LOW (ref 13.0–17.0)
Hemoglobin: 11.6 g/dL — ABNORMAL LOW (ref 13.0–17.0)
Hemoglobin: 10.9 g/dL — ABNORMAL LOW (ref 13.0–17.0)
Hemoglobin: 8.8 g/dL — ABNORMAL LOW (ref 13.0–17.0)
O2 Saturation: 100 %
O2 Saturation: 100 %
O2 Saturation: 100 %
O2 Saturation: 100 %
O2 Saturation: 95 %
O2 Saturation: 93 %
O2 Saturation: 91 %
O2 Saturation: 92 %
O2 Saturation: 95 %
Patient temperature: 35.9
Patient temperature: 36.6
Patient temperature: 36.8
Patient temperature: 36.9
Potassium: 5.8 mmol/L — ABNORMAL HIGH (ref 3.5–5.1)
Potassium: 5.6 mmol/L — ABNORMAL HIGH (ref 3.5–5.1)
Potassium: 6.3 mmol/L (ref 3.5–5.1)
Potassium: 5 mmol/L (ref 3.5–5.1)
Potassium: 6 mmol/L — ABNORMAL HIGH (ref 3.5–5.1)
Potassium: 4.8 mmol/L (ref 3.5–5.1)
Potassium: 4.9 mmol/L (ref 3.5–5.1)
Potassium: 4.6 mmol/L (ref 3.5–5.1)
Potassium: 3.8 mmol/L (ref 3.5–5.1)
Sodium: 133 mmol/L — ABNORMAL LOW (ref 135–145)
Sodium: 131 mmol/L — ABNORMAL LOW (ref 135–145)
Sodium: 134 mmol/L — ABNORMAL LOW (ref 135–145)
Sodium: 134 mmol/L — ABNORMAL LOW (ref 135–145)
Sodium: 136 mmol/L (ref 135–145)
Sodium: 136 mmol/L (ref 135–145)
Sodium: 135 mmol/L (ref 135–145)
Sodium: 135 mmol/L (ref 135–145)
Sodium: 139 mmol/L (ref 135–145)
TCO2: 24 mmol/L (ref 22–32)
TCO2: 23 mmol/L (ref 22–32)
TCO2: 27 mmol/L (ref 22–32)
TCO2: 24 mmol/L (ref 22–32)
TCO2: 25 mmol/L (ref 22–32)
TCO2: 27 mmol/L (ref 22–32)
TCO2: 24 mmol/L (ref 22–32)
TCO2: 24 mmol/L (ref 22–32)
TCO2: 19 mmol/L — ABNORMAL LOW (ref 22–32)
pCO2 arterial: 36.9 mm[Hg] (ref 32–48)
pCO2 arterial: 34.7 mm[Hg] (ref 32–48)
pCO2 arterial: 54.9 mm[Hg] — ABNORMAL HIGH (ref 32–48)
pCO2 arterial: 39.9 mm[Hg] (ref 32–48)
pCO2 arterial: 41.8 mm[Hg] (ref 32–48)
pCO2 arterial: 48.7 mm[Hg] — ABNORMAL HIGH (ref 32–48)
pCO2 arterial: 40.4 mm[Hg] (ref 32–48)
pCO2 arterial: 43.7 mm[Hg] (ref 32–48)
pCO2 arterial: 33.2 mm[Hg] (ref 32–48)
pH, Arterial: 7.391 (ref 7.35–7.45)
pH, Arterial: 7.275 — ABNORMAL LOW (ref 7.35–7.45)
pH, Arterial: 7.418 (ref 7.35–7.45)
pH, Arterial: 7.349 — ABNORMAL LOW (ref 7.35–7.45)
pH, Arterial: 7.391 (ref 7.35–7.45)
pH, Arterial: 7.319 — ABNORMAL LOW (ref 7.35–7.45)
pH, Arterial: 7.35 (ref 7.35–7.45)
pH, Arterial: 7.33 — ABNORMAL LOW (ref 7.35–7.45)
pH, Arterial: 7.334 — ABNORMAL LOW (ref 7.35–7.45)
pO2, Arterial: 321 mm[Hg] — ABNORMAL HIGH (ref 83–108)
pO2, Arterial: 376 mm[Hg] — ABNORMAL HIGH (ref 83–108)
pO2, Arterial: 387 mm[Hg] — ABNORMAL HIGH (ref 83–108)
pO2, Arterial: 283 mm[Hg] — ABNORMAL HIGH (ref 83–108)
pO2, Arterial: 81 mm[Hg] — ABNORMAL LOW (ref 83–108)
pO2, Arterial: 71 mm[Hg] — ABNORMAL LOW (ref 83–108)
pO2, Arterial: 64 mm[Hg] — ABNORMAL LOW (ref 83–108)
pO2, Arterial: 68 mm[Hg] — ABNORMAL LOW (ref 83–108)
pO2, Arterial: 78 mm[Hg] — ABNORMAL LOW (ref 83–108)

## 2023-04-26 LAB — CBC
HCT: 42.8 % (ref 39.0–52.0)
HCT: 32.8 % — ABNORMAL LOW (ref 39.0–52.0)
Hemoglobin: 14 g/dL (ref 13.0–17.0)
Hemoglobin: 11.1 g/dL — ABNORMAL LOW (ref 13.0–17.0)
MCH: 29.5 pg (ref 26.0–34.0)
MCH: 30 pg (ref 26.0–34.0)
MCHC: 32.7 g/dL (ref 30.0–36.0)
MCHC: 33.8 g/dL (ref 30.0–36.0)
MCV: 90.1 fL (ref 80.0–100.0)
MCV: 88.6 fL (ref 80.0–100.0)
Platelets: 200 10*3/uL (ref 150–400)
Platelets: 91 10*3/uL — ABNORMAL LOW (ref 150–400)
RBC: 4.75 MIL/uL (ref 4.22–5.81)
RBC: 3.7 MIL/uL — ABNORMAL LOW (ref 4.22–5.81)
RDW: 14.7 % (ref 11.5–15.5)
RDW: 14.3 % (ref 11.5–15.5)
WBC: 6.9 10*3/uL (ref 4.0–10.5)
WBC: 11.6 10*3/uL — ABNORMAL HIGH (ref 4.0–10.5)
nRBC: 0 % (ref 0.0–0.2)
nRBC: 0 % (ref 0.0–0.2)

## 2023-04-26 LAB — POCT I-STAT, CHEM 8
BUN: 20 mg/dL (ref 6–20)
BUN: 19 mg/dL (ref 6–20)
BUN: 17 mg/dL (ref 6–20)
BUN: 17 mg/dL (ref 6–20)
BUN: 20 mg/dL (ref 6–20)
BUN: 20 mg/dL (ref 6–20)
BUN: 20 mg/dL (ref 6–20)
BUN: 20 mg/dL (ref 6–20)
Calcium, Ion: 1.2 mmol/L (ref 1.15–1.40)
Calcium, Ion: 1.13 mmol/L — ABNORMAL LOW (ref 1.15–1.40)
Calcium, Ion: 0.94 mmol/L — ABNORMAL LOW (ref 1.15–1.40)
Calcium, Ion: 0.97 mmol/L — ABNORMAL LOW (ref 1.15–1.40)
Calcium, Ion: 0.97 mmol/L — ABNORMAL LOW (ref 1.15–1.40)
Calcium, Ion: 0.95 mmol/L — ABNORMAL LOW (ref 1.15–1.40)
Calcium, Ion: 0.93 mmol/L — ABNORMAL LOW (ref 1.15–1.40)
Calcium, Ion: 1.12 mmol/L — ABNORMAL LOW (ref 1.15–1.40)
Chloride: 101 mmol/L (ref 98–111)
Chloride: 104 mmol/L (ref 98–111)
Chloride: 100 mmol/L (ref 98–111)
Chloride: 102 mmol/L (ref 98–111)
Chloride: 100 mmol/L (ref 98–111)
Chloride: 101 mmol/L (ref 98–111)
Chloride: 101 mmol/L (ref 98–111)
Chloride: 103 mmol/L (ref 98–111)
Creatinine, Ser: 1 mg/dL (ref 0.61–1.24)
Creatinine, Ser: 0.9 mg/dL (ref 0.61–1.24)
Creatinine, Ser: 0.9 mg/dL (ref 0.61–1.24)
Creatinine, Ser: 0.9 mg/dL (ref 0.61–1.24)
Creatinine, Ser: 1 mg/dL (ref 0.61–1.24)
Creatinine, Ser: 0.9 mg/dL (ref 0.61–1.24)
Creatinine, Ser: 0.9 mg/dL (ref 0.61–1.24)
Creatinine, Ser: 1 mg/dL (ref 0.61–1.24)
Glucose, Bld: 136 mg/dL — ABNORMAL HIGH (ref 70–99)
Glucose, Bld: 147 mg/dL — ABNORMAL HIGH (ref 70–99)
Glucose, Bld: 117 mg/dL — ABNORMAL HIGH (ref 70–99)
Glucose, Bld: 122 mg/dL — ABNORMAL HIGH (ref 70–99)
Glucose, Bld: 163 mg/dL — ABNORMAL HIGH (ref 70–99)
Glucose, Bld: 164 mg/dL — ABNORMAL HIGH (ref 70–99)
Glucose, Bld: 149 mg/dL — ABNORMAL HIGH (ref 70–99)
Glucose, Bld: 140 mg/dL — ABNORMAL HIGH (ref 70–99)
HCT: 41 % (ref 39.0–52.0)
HCT: 39 % (ref 39.0–52.0)
HCT: 30 % — ABNORMAL LOW (ref 39.0–52.0)
HCT: 29 % — ABNORMAL LOW (ref 39.0–52.0)
HCT: 31 % — ABNORMAL LOW (ref 39.0–52.0)
HCT: 28 % — ABNORMAL LOW (ref 39.0–52.0)
HCT: 28 % — ABNORMAL LOW (ref 39.0–52.0)
HCT: 29 % — ABNORMAL LOW (ref 39.0–52.0)
Hemoglobin: 13.9 g/dL (ref 13.0–17.0)
Hemoglobin: 13.3 g/dL (ref 13.0–17.0)
Hemoglobin: 10.2 g/dL — ABNORMAL LOW (ref 13.0–17.0)
Hemoglobin: 9.9 g/dL — ABNORMAL LOW (ref 13.0–17.0)
Hemoglobin: 10.5 g/dL — ABNORMAL LOW (ref 13.0–17.0)
Hemoglobin: 9.5 g/dL — ABNORMAL LOW (ref 13.0–17.0)
Hemoglobin: 9.5 g/dL — ABNORMAL LOW (ref 13.0–17.0)
Hemoglobin: 9.9 g/dL — ABNORMAL LOW (ref 13.0–17.0)
Potassium: 4.6 mmol/L (ref 3.5–5.1)
Potassium: 5.9 mmol/L — ABNORMAL HIGH (ref 3.5–5.1)
Potassium: 5.4 mmol/L — ABNORMAL HIGH (ref 3.5–5.1)
Potassium: 7.8 mmol/L (ref 3.5–5.1)
Potassium: 5.6 mmol/L — ABNORMAL HIGH (ref 3.5–5.1)
Potassium: 6.3 mmol/L (ref 3.5–5.1)
Potassium: 5.8 mmol/L — ABNORMAL HIGH (ref 3.5–5.1)
Potassium: 5 mmol/L (ref 3.5–5.1)
Sodium: 135 mmol/L (ref 135–145)
Sodium: 134 mmol/L — ABNORMAL LOW (ref 135–145)
Sodium: 133 mmol/L — ABNORMAL LOW (ref 135–145)
Sodium: 131 mmol/L — ABNORMAL LOW (ref 135–145)
Sodium: 133 mmol/L — ABNORMAL LOW (ref 135–145)
Sodium: 132 mmol/L — ABNORMAL LOW (ref 135–145)
Sodium: 133 mmol/L — ABNORMAL LOW (ref 135–145)
Sodium: 135 mmol/L (ref 135–145)
TCO2: 24 mmol/L (ref 22–32)
TCO2: 23 mmol/L (ref 22–32)
TCO2: 23 mmol/L (ref 22–32)
TCO2: 26 mmol/L (ref 22–32)
TCO2: 25 mmol/L (ref 22–32)
TCO2: 25 mmol/L (ref 22–32)
TCO2: 25 mmol/L (ref 22–32)
TCO2: 22 mmol/L (ref 22–32)

## 2023-04-26 LAB — BASIC METABOLIC PANEL
Anion gap: 9 (ref 5–15)
BUN: 19 mg/dL (ref 6–20)
CO2: 25 mmol/L (ref 22–32)
Calcium: 8.9 mg/dL (ref 8.9–10.3)
Chloride: 101 mmol/L (ref 98–111)
Creatinine, Ser: 1.16 mg/dL (ref 0.61–1.24)
GFR, Estimated: >60 mL/min (ref >60)
Glucose, Bld: 140 mg/dL — ABNORMAL HIGH (ref 70–99)
Potassium: 4.4 mmol/L (ref 3.5–5.1)
Sodium: 135 mmol/L (ref 135–145)

## 2023-04-26 LAB — PROTIME-INR
INR: 1.3 — ABNORMAL HIGH (ref 0.8–1.2)
Prothrombin Time: 16.6 s — ABNORMAL HIGH (ref 11.4–15.2)

## 2023-04-26 LAB — POCT I-STAT EG7
Acid-base deficit: 3 mmol/L — ABNORMAL HIGH (ref 0.0–2.0)
Bicarbonate: 22.9 mmol/L (ref 20.0–28.0)
Calcium, Ion: 1.03 mmol/L — ABNORMAL LOW (ref 1.15–1.40)
HCT: 31 % — ABNORMAL LOW (ref 39.0–52.0)
Hemoglobin: 10.5 g/dL — ABNORMAL LOW (ref 13.0–17.0)
O2 Saturation: 71 %
Potassium: 5.6 mmol/L — ABNORMAL HIGH (ref 3.5–5.1)
Sodium: 134 mmol/L — ABNORMAL LOW (ref 135–145)
TCO2: 24 mmol/L (ref 22–32)
pCO2, Ven: 41 mm[Hg] — ABNORMAL LOW (ref 44–60)
pH, Ven: 7.354 (ref 7.25–7.43)
pO2, Ven: 39 mm[Hg] (ref 32–45)

## 2023-04-26 LAB — HEMOGLOBIN AND HEMATOCRIT, BLOOD
HCT: 27.4 % — ABNORMAL LOW (ref 39.0–52.0)
Hemoglobin: 9.2 g/dL — ABNORMAL LOW (ref 13.0–17.0)

## 2023-04-26 LAB — GLUCOSE, CAPILLARY
Glucose-Capillary: 126 mg/dL — ABNORMAL HIGH (ref 70–99)
Glucose-Capillary: 169 mg/dL — ABNORMAL HIGH (ref 70–99)
Glucose-Capillary: 165 mg/dL — ABNORMAL HIGH (ref 70–99)
Glucose-Capillary: 165 mg/dL — ABNORMAL HIGH (ref 70–99)
Glucose-Capillary: 158 mg/dL — ABNORMAL HIGH (ref 70–99)
Glucose-Capillary: 134 mg/dL — ABNORMAL HIGH (ref 70–99)

## 2023-04-26 LAB — PLATELET COUNT: Platelets: 95 10*3/uL — ABNORMAL LOW (ref 150–400)

## 2023-04-26 LAB — PREPARE RBC (CROSSMATCH)

## 2023-04-26 LAB — FIBRINOGEN: Fibrinogen: 199 mg/dL — ABNORMAL LOW (ref 210–475)

## 2023-04-26 LAB — HEPARIN LEVEL (UNFRACTIONATED): Heparin Unfractionated: 0.63 [IU]/mL (ref 0.30–0.70)

## 2023-04-26 LAB — APTT: aPTT: 30 s (ref 24–36)

## 2023-04-26 LAB — MAGNESIUM: Magnesium: 2 mg/dL (ref 1.7–2.4)

## 2023-04-26 SURGERY — REDO STERNOTOMY
Anesthesia: General | Site: Chest

## 2023-04-26 MED ORDER — INSULIN REGULAR(HUMAN) IN NACL 100-0.9 UT/100ML-% IV SOLN: INTRAVENOUS | Status: DC

## 2023-04-26 MED ORDER — CLEVIDIPINE BUTYRATE 0.5 MG/ML IV EMUL
INTRAVENOUS | Status: DC | PRN
  Administered 2023-04-26: 4 mg/h via INTRAVENOUS

## 2023-04-26 MED ORDER — PROPOFOL 10 MG/ML IV BOLUS
INTRAVENOUS | Status: DC | PRN
  Administered 2023-04-26: 200 mg via INTRAVENOUS
  Administered 2023-04-26: 50 mg via INTRAVENOUS
  Administered 2023-04-26: 150 mg via INTRAVENOUS

## 2023-04-26 MED ORDER — FENTANYL CITRATE (PF) 250 MCG/5ML IJ SOLN
INTRAMUSCULAR | Status: DC | PRN
  Administered 2023-04-26: 50 ug via INTRAVENOUS
  Administered 2023-04-26: 250 ug via INTRAVENOUS
  Administered 2023-04-26 (×4): 100 ug via INTRAVENOUS
  Administered 2023-04-26 (×2): 150 ug via INTRAVENOUS
  Administered 2023-04-26: 100 ug via INTRAVENOUS
  Administered 2023-04-26: 150 ug via INTRAVENOUS

## 2023-04-26 MED ORDER — ASPIRIN 81 MG PO CHEW
324.0000 mg | CHEWABLE_TABLET | Freq: Once | ORAL | Status: AC
  Administered 2023-04-26: 324 mg via ORAL
  Filled 2023-04-26: qty 4

## 2023-04-26 MED ORDER — PROPOFOL 10 MG/ML IV BOLUS
INTRAVENOUS | Status: AC
  Filled 2023-04-26: qty 20

## 2023-04-26 MED ORDER — BISACODYL 10 MG RE SUPP: 10.0000 mg | Freq: Every day | RECTAL | Status: DC

## 2023-04-26 MED ORDER — THROMBIN (RECOMBINANT) 20000 UNITS EX SOLR
CUTANEOUS | Status: AC
  Filled 2023-04-26: qty 20000

## 2023-04-26 MED ORDER — SODIUM CHLORIDE 0.9 % IV SOLN: 250.0000 mL | INTRAVENOUS | Status: AC

## 2023-04-26 MED ORDER — METHYLPREDNISOLONE SODIUM SUCC 125 MG IJ SOLR
INTRAMUSCULAR | Status: DC | PRN
  Administered 2023-04-26: 125 mg via INTRAVENOUS

## 2023-04-26 MED ORDER — LACTATED RINGERS IV SOLN: INTRAVENOUS | Status: DC | PRN

## 2023-04-26 MED ORDER — MIDAZOLAM HCL 2 MG/2ML IJ SOLN
2.0000 mg | INTRAMUSCULAR | Status: DC | PRN
  Administered 2023-04-26: 2 mg via INTRAVENOUS
  Filled 2023-04-26: qty 2

## 2023-04-26 MED ORDER — PROTAMINE SULFATE 10 MG/ML IV SOLN
INTRAVENOUS | Status: AC
  Filled 2023-04-26: qty 5

## 2023-04-26 MED ORDER — METOCLOPRAMIDE HCL 5 MG/ML IJ SOLN
10.0000 mg | Freq: Four times a day (QID) | INTRAMUSCULAR | Status: AC
  Administered 2023-04-26 – 2023-04-27 (×6): 10 mg via INTRAVENOUS
  Filled 2023-04-26 (×5): qty 2

## 2023-04-26 MED ORDER — METOPROLOL TARTRATE 5 MG/5ML IV SOLN: 2.5000 mg | INTRAVENOUS | Status: DC | PRN

## 2023-04-26 MED ORDER — DEXMEDETOMIDINE HCL IN NACL 400 MCG/100ML IV SOLN
0.0000 ug/kg/h | INTRAVENOUS | Status: DC
  Administered 2023-04-26: 0.7 ug/kg/h via INTRAVENOUS
  Filled 2023-04-26: qty 100

## 2023-04-26 MED ORDER — MIDAZOLAM HCL (PF) 5 MG/ML IJ SOLN
INTRAMUSCULAR | Status: DC | PRN
  Administered 2023-04-26 (×4): 2 mg via INTRAVENOUS
  Administered 2023-04-26: 4 mg via INTRAVENOUS

## 2023-04-26 MED ORDER — ACETAMINOPHEN 160 MG/5ML PO SOLN
650.0000 mg | Freq: Once | ORAL | Status: AC
  Administered 2023-04-26: 650 mg
  Filled 2023-04-26: qty 20.3

## 2023-04-26 MED ORDER — FENTANYL CITRATE (PF) 250 MCG/5ML IJ SOLN
INTRAMUSCULAR | Status: AC
  Filled 2023-04-26: qty 5

## 2023-04-26 MED ORDER — MAGNESIUM SULFATE 4 GM/100ML IV SOLN
4.0000 g | Freq: Once | INTRAVENOUS | Status: AC
  Administered 2023-04-26: 4 g via INTRAVENOUS
  Filled 2023-04-26: qty 100

## 2023-04-26 MED ORDER — MORPHINE SULFATE (PF) 2 MG/ML IV SOLN
1.0000 mg | INTRAVENOUS | Status: DC | PRN
  Administered 2023-04-27: 2 mg via INTRAVENOUS
  Filled 2023-04-26: qty 1

## 2023-04-26 MED ORDER — LACTATED RINGERS IV SOLN: INTRAVENOUS | Status: AC

## 2023-04-26 MED ORDER — PHENYLEPHRINE 80 MCG/ML (10ML) SYRINGE FOR IV PUSH (FOR BLOOD PRESSURE SUPPORT)
PREFILLED_SYRINGE | INTRAVENOUS | Status: DC | PRN
  Administered 2023-04-26: 40 ug via INTRAVENOUS

## 2023-04-26 MED ORDER — METOPROLOL TARTRATE 25 MG/10 ML ORAL SUSPENSION: 12.5000 mg | Freq: Two times a day (BID) | ORAL | Status: DC

## 2023-04-26 MED ORDER — SODIUM CHLORIDE 0.9% FLUSH
10.0000 mL | Freq: Two times a day (BID) | INTRAVENOUS | Status: DC
  Administered 2023-04-27 – 2023-04-28 (×3): 10 mL

## 2023-04-26 MED ORDER — HEPARIN SODIUM (PORCINE) 1000 UNIT/ML IJ SOLN
INTRAMUSCULAR | Status: DC | PRN
  Administered 2023-04-26: 33000 [IU] via INTRAVENOUS

## 2023-04-26 MED ORDER — ROCURONIUM BROMIDE 10 MG/ML (PF) SYRINGE
PREFILLED_SYRINGE | INTRAVENOUS | Status: DC | PRN
  Administered 2023-04-26: 50 mg via INTRAVENOUS
  Administered 2023-04-26: 100 mg via INTRAVENOUS
  Administered 2023-04-26 (×2): 50 mg via INTRAVENOUS
  Administered 2023-04-26: 100 mg via INTRAVENOUS
  Administered 2023-04-26: 50 mg via INTRAVENOUS

## 2023-04-26 MED ORDER — SODIUM CHLORIDE 0.9 % IV SOLN: INTRAVENOUS | Status: DC

## 2023-04-26 MED ORDER — HEPARIN SODIUM (PORCINE) 1000 UNIT/ML IJ SOLN
INTRAMUSCULAR | Status: AC
Start: 2023-04-26 — End: ?
  Filled 2023-04-26: qty 1

## 2023-04-26 MED ORDER — NITROGLYCERIN IN D5W 200-5 MCG/ML-% IV SOLN: 0.0000 ug/min | INTRAVENOUS | Status: DC

## 2023-04-26 MED ORDER — EPHEDRINE 5 MG/ML INJ
INTRAVENOUS | Status: AC
  Filled 2023-04-26: qty 5

## 2023-04-26 MED ORDER — BISACODYL 5 MG PO TBEC
10.0000 mg | DELAYED_RELEASE_TABLET | Freq: Every day | ORAL | Status: DC
  Administered 2023-04-27 – 2023-04-28 (×2): 10 mg via ORAL
  Filled 2023-04-26 (×2): qty 2

## 2023-04-26 MED ORDER — PROTAMINE SULFATE 10 MG/ML IV SOLN
INTRAVENOUS | Status: DC | PRN
  Administered 2023-04-26: 300 mg via INTRAVENOUS

## 2023-04-26 MED ORDER — DOCUSATE SODIUM 100 MG PO CAPS
200.0000 mg | ORAL_CAPSULE | Freq: Every day | ORAL | Status: DC
  Administered 2023-04-27 – 2023-05-04 (×7): 200 mg via ORAL
  Filled 2023-04-26 (×8): qty 2

## 2023-04-26 MED ORDER — ASPIRIN 325 MG PO TBEC
325.0000 mg | DELAYED_RELEASE_TABLET | Freq: Every day | ORAL | Status: DC
  Administered 2023-04-27: 325 mg via ORAL
  Filled 2023-04-26 (×2): qty 1

## 2023-04-26 MED ORDER — ASPIRIN 81 MG PO CHEW: 324.0000 mg | CHEWABLE_TABLET | Freq: Every day | ORAL | Status: DC

## 2023-04-26 MED ORDER — PANTOPRAZOLE SODIUM 40 MG PO TBEC
40.0000 mg | DELAYED_RELEASE_TABLET | Freq: Every day | ORAL | Status: DC
  Administered 2023-04-28 – 2023-05-04 (×7): 40 mg via ORAL
  Filled 2023-04-26 (×7): qty 1

## 2023-04-26 MED ORDER — PHENYLEPHRINE HCL-NACL 20-0.9 MG/250ML-% IV SOLN: 0.0000 ug/min | INTRAVENOUS | Status: DC

## 2023-04-26 MED ORDER — PANTOPRAZOLE SODIUM 40 MG IV SOLR
40.0000 mg | Freq: Every day | INTRAVENOUS | Status: AC
  Administered 2023-04-26 – 2023-04-27 (×2): 40 mg via INTRAVENOUS
  Filled 2023-04-26 (×2): qty 10

## 2023-04-26 MED ORDER — PROTAMINE SULFATE 10 MG/ML IV SOLN
INTRAVENOUS | Status: AC
  Filled 2023-04-26: qty 25

## 2023-04-26 MED ORDER — HEMOSTATIC AGENTS (NO CHARGE) OPTIME
TOPICAL | Status: DC | PRN
  Administered 2023-04-26 (×3): 1 via TOPICAL

## 2023-04-26 MED ORDER — SODIUM CHLORIDE 0.9% FLUSH: 10.0000 mL | INTRAVENOUS | Status: DC | PRN

## 2023-04-26 MED ORDER — TRAMADOL HCL 50 MG PO TABS
50.0000 mg | ORAL_TABLET | ORAL | Status: DC | PRN
  Administered 2023-04-27: 50 mg via ORAL
  Filled 2023-04-26: qty 1

## 2023-04-26 MED ORDER — DEXTROSE 50 % IV SOLN: 0.0000 mL | INTRAVENOUS | Status: DC | PRN

## 2023-04-26 MED ORDER — PLASMA-LYTE A IV SOLN: INTRAVENOUS | Status: DC | PRN

## 2023-04-26 MED ORDER — CHLORHEXIDINE GLUCONATE 0.12 % MT SOLN
15.0000 mL | OROMUCOSAL | Status: AC
  Filled 2023-04-26: qty 15

## 2023-04-26 MED ORDER — CHLORHEXIDINE GLUCONATE CLOTH 2 % EX PADS
6.0000 | MEDICATED_PAD | Freq: Every day | CUTANEOUS | Status: DC
  Administered 2023-04-26 – 2023-04-29 (×4): 6 via TOPICAL

## 2023-04-26 MED ORDER — PHENYLEPHRINE 80 MCG/ML (10ML) SYRINGE FOR IV PUSH (FOR BLOOD PRESSURE SUPPORT)
PREFILLED_SYRINGE | INTRAVENOUS | Status: AC
  Filled 2023-04-26: qty 10

## 2023-04-26 MED ORDER — METOPROLOL TARTRATE 12.5 MG HALF TABLET: 12.5000 mg | ORAL_TABLET | Freq: Two times a day (BID) | ORAL | Status: DC

## 2023-04-26 MED ORDER — ALPRAZOLAM 0.5 MG PO TABS
1.0000 mg | ORAL_TABLET | ORAL | Status: DC | PRN
  Administered 2023-04-26 – 2023-04-27 (×2): 1 mg via ORAL
  Filled 2023-04-26 (×2): qty 2

## 2023-04-26 MED ORDER — HEPARIN SODIUM (PORCINE) 1000 UNIT/ML IJ SOLN
INTRAMUSCULAR | Status: AC
  Filled 2023-04-26: qty 10

## 2023-04-26 MED ORDER — SODIUM CHLORIDE 0.9% FLUSH
3.0000 mL | Freq: Two times a day (BID) | INTRAVENOUS | Status: DC
  Administered 2023-04-27 – 2023-04-29 (×5): 3 mL via INTRAVENOUS

## 2023-04-26 MED ORDER — ACETAMINOPHEN 500 MG PO TABS
1000.0000 mg | ORAL_TABLET | Freq: Four times a day (QID) | ORAL | Status: AC
  Administered 2023-04-26 – 2023-05-01 (×19): 1000 mg via ORAL
  Filled 2023-04-26 (×20): qty 2

## 2023-04-26 MED ORDER — SODIUM CHLORIDE 0.9 % IV SOLN: INTRAVENOUS | Status: DC | PRN

## 2023-04-26 MED ORDER — ALBUMIN HUMAN 5 % IV SOLN: 250.0000 mL | INTRAVENOUS | Status: DC | PRN

## 2023-04-26 MED ORDER — GELATIN ABSORBABLE MT POWD
OROMUCOSAL | Status: DC | PRN
  Administered 2023-04-26 (×4): 4 mL via TOPICAL

## 2023-04-26 MED ORDER — MIDAZOLAM HCL (PF) 10 MG/2ML IJ SOLN
INTRAMUSCULAR | Status: AC
  Filled 2023-04-26: qty 2

## 2023-04-26 MED ORDER — 0.9 % SODIUM CHLORIDE (POUR BTL) OPTIME
TOPICAL | Status: DC | PRN
  Administered 2023-04-26: 6000 mL

## 2023-04-26 MED ORDER — OXYCODONE HCL 5 MG PO TABS
5.0000 mg | ORAL_TABLET | ORAL | Status: AC | PRN
Start: 2023-04-26 — End: ?
  Administered 2023-04-26 (×2): 5 mg via ORAL
  Administered 2023-04-27 – 2023-04-28 (×7): 10 mg via ORAL
  Administered 2023-04-28 (×2): 5 mg via ORAL
  Administered 2023-04-28 (×2): 10 mg via ORAL
  Administered 2023-04-29 – 2023-04-30 (×4): 5 mg via ORAL
  Filled 2023-04-26: qty 1
  Filled 2023-04-26 (×3): qty 2
  Filled 2023-04-26 (×4): qty 1
  Filled 2023-04-26 (×2): qty 2
  Filled 2023-04-26: qty 1
  Filled 2023-04-26 (×3): qty 2
  Filled 2023-04-26 (×2): qty 1
  Filled 2023-04-26 (×2): qty 2

## 2023-04-26 MED ORDER — VANCOMYCIN HCL IN DEXTROSE 1-5 GM/200ML-% IV SOLN
1000.0000 mg | Freq: Once | INTRAVENOUS | Status: AC
  Administered 2023-04-26: 1000 mg via INTRAVENOUS
  Filled 2023-04-26: qty 200

## 2023-04-26 MED ORDER — POTASSIUM CHLORIDE 10 MEQ/50ML IV SOLN: 10.0000 meq | INTRAVENOUS | Status: AC

## 2023-04-26 MED ORDER — LIDOCAINE 2% (20 MG/ML) 5 ML SYRINGE
INTRAMUSCULAR | Status: AC
  Filled 2023-04-26: qty 5

## 2023-04-26 MED ORDER — CEFAZOLIN SODIUM-DEXTROSE 2-4 GM/100ML-% IV SOLN
2.0000 g | Freq: Three times a day (TID) | INTRAVENOUS | Status: AC
  Administered 2023-04-26 – 2023-04-28 (×6): 2 g via INTRAVENOUS
  Filled 2023-04-26 (×6): qty 100

## 2023-04-26 MED ORDER — SODIUM CHLORIDE 0.45 % IV SOLN: INTRAVENOUS | Status: DC | PRN

## 2023-04-26 MED ORDER — CHLORHEXIDINE GLUCONATE 0.12 % MT SOLN
OROMUCOSAL | Status: AC
  Administered 2023-04-26: 15 mL via OROMUCOSAL
  Filled 2023-04-26: qty 15

## 2023-04-26 MED ORDER — ALPRAZOLAM 1 MG PO TABS: 1.0000 mg | ORAL_TABLET | Freq: Four times a day (QID) | ORAL | Status: DC | PRN

## 2023-04-26 MED ORDER — CLEVIDIPINE BUTYRATE 0.5 MG/ML IV EMUL
0.0000 mg/h | INTRAVENOUS | Status: DC
  Administered 2023-04-26: 8 mg/h via INTRAVENOUS
  Filled 2023-04-26 (×3): qty 100

## 2023-04-26 MED ORDER — MIDAZOLAM HCL 2 MG/2ML IJ SOLN
INTRAMUSCULAR | Status: AC
  Filled 2023-04-26: qty 2

## 2023-04-26 MED ORDER — ONDANSETRON HCL 4 MG/2ML IJ SOLN: 4.0000 mg | Freq: Four times a day (QID) | INTRAMUSCULAR | Status: DC | PRN

## 2023-04-26 MED ORDER — SODIUM CHLORIDE 0.9% FLUSH: 3.0000 mL | INTRAVENOUS | Status: DC | PRN

## 2023-04-26 MED ORDER — CLEVIDIPINE BUTYRATE 0.5 MG/ML IV EMUL
INTRAVENOUS | Status: AC
  Filled 2023-04-26: qty 50

## 2023-04-26 MED ORDER — ACETAMINOPHEN 160 MG/5ML PO SOLN: 1000.0000 mg | Freq: Four times a day (QID) | ORAL | Status: AC

## 2023-04-26 MED ORDER — NITROGLYCERIN 0.2 MG/ML ON CALL CATH LAB
INTRAVENOUS | Status: DC | PRN
  Administered 2023-04-26 (×2): 40 ug via INTRAVENOUS

## 2023-04-26 MED ORDER — ROCURONIUM BROMIDE 10 MG/ML (PF) SYRINGE
PREFILLED_SYRINGE | INTRAVENOUS | Status: AC
  Filled 2023-04-26: qty 20

## 2023-04-26 SURGICAL SUPPLY — 99 items
ADAPTER CARDIO PERF ANTE/RETRO (ADAPTER) ×2 IMPLANT
APPLICATOR PREVELEAK SEALANT (TIP) IMPLANT
BAG DECANTER FOR FLEXI CONT (MISCELLANEOUS) ×2 IMPLANT
BLADE CLIPPER SURG (BLADE) ×2 IMPLANT
BLADE CORE FAN STRYKER (BLADE) ×2 IMPLANT
BLADE SURG 11 STRL SS (BLADE) IMPLANT
BLADE SURG 15 STRL LF DISP TIS (BLADE) ×2 IMPLANT
CANISTER SUCT 3000ML PPV (MISCELLANEOUS) ×2 IMPLANT
CANNULA FEM BIOMEDICUS 25FR (CANNULA) IMPLANT
CANNULA GUNDRY RCSP 15FR (MISCELLANEOUS) ×2 IMPLANT
CANNULA SUMP PERICARDIAL (CANNULA) IMPLANT
CATH HEART VENT LEFT (CATHETERS) ×2 IMPLANT
CATH ROBINSON RED A/P 18FR (CATHETERS) ×6 IMPLANT
CATH THORACIC 28FR (CATHETERS) IMPLANT
CATH THORACIC 36FR (CATHETERS) ×2 IMPLANT
CATH THORACIC 36FR RT ANG (CATHETERS) ×2 IMPLANT
CAUTERY HI TEMP FINE TIP 2 (MISCELLANEOUS) IMPLANT
CIRCUIT VACPAC SAFETY (MISCELLANEOUS) IMPLANT
CLOSURE PERCLOSE PROSTYLE (VASCULAR PRODUCTS) IMPLANT
CNTNR URN SCR LID CUP LEK RST (MISCELLANEOUS) ×2 IMPLANT
CONN ST 3/8 X 1/2 (MISCELLANEOUS) IMPLANT
CONTAINER PROTECT SURGISLUSH (MISCELLANEOUS) ×4 IMPLANT
DERMABOND ADVANCED .7 DNX12 (GAUZE/BANDAGES/DRESSINGS) IMPLANT
DRAPE WARM FLUID 44X44 (DRAPES) IMPLANT
DRSG COVADERM 4X14 (GAUZE/BANDAGES/DRESSINGS) ×2 IMPLANT
ELECT BLADE 4.0 EZ CLEAN MEGAD (MISCELLANEOUS) ×2 IMPLANT
ELECT CAUTERY BLADE 6.4 (BLADE) ×2 IMPLANT
ELECT REM PT RETURN 9FT ADLT (ELECTROSURGICAL) ×4 IMPLANT
ELECTRODE BLDE 4.0 EZ CLN MEGD (MISCELLANEOUS) IMPLANT
ELECTRODE REM PT RTRN 9FT ADLT (ELECTROSURGICAL) ×4 IMPLANT
FELT TEFLON 1X6 (MISCELLANEOUS) ×2 IMPLANT
GAUZE SPONGE 4X4 12PLY STRL (GAUZE/BANDAGES/DRESSINGS) ×2 IMPLANT
GAUZE SPONGE 4X4 12PLY STRL LF (GAUZE/BANDAGES/DRESSINGS) IMPLANT
GLOVE BIO SURGEON STRL SZ 6 (GLOVE) IMPLANT
GLOVE BIO SURGEON STRL SZ 6.5 (GLOVE) IMPLANT
GLOVE BIO SURGEON STRL SZ7 (GLOVE) IMPLANT
GOWN STRL REUS W/ TWL LRG LVL3 (GOWN DISPOSABLE) ×8 IMPLANT
GOWN STRL REUS W/ TWL XL LVL3 (GOWN DISPOSABLE) ×2 IMPLANT
GRAFT CV 30X8WVN NDL (Graft) IMPLANT
GRAFT WOVEN D/V 32DX30L (Vascular Products) IMPLANT
HEMOSTAT POWDER SURGIFOAM 1G (HEMOSTASIS) ×6 IMPLANT
HEMOSTAT SURGICEL 2X14 (HEMOSTASIS) ×2 IMPLANT
INSERT FOGARTY XLG (MISCELLANEOUS) IMPLANT
KIT BASIN OR (CUSTOM PROCEDURE TRAY) ×2 IMPLANT
KIT CATH CPB BARTLE (MISCELLANEOUS) ×2 IMPLANT
KIT DILATOR VASC 18G NDL (KITS) IMPLANT
KIT SUCTION CATH 14FR (SUCTIONS) ×2 IMPLANT
KIT TURNOVER KIT B (KITS) ×2 IMPLANT
LINE VENT (MISCELLANEOUS) IMPLANT
LOOP VASCLR MAXI BLUE 18IN ST (MISCELLANEOUS) IMPLANT
LOOPS VASCLR MAXI BLUE 18IN ST (MISCELLANEOUS) ×2 IMPLANT
NS IRRIG 1000ML POUR BTL (IV SOLUTION) ×10 IMPLANT
PACK E OPEN HEART (SUTURE) ×2 IMPLANT
PACK OPEN HEART (CUSTOM PROCEDURE TRAY) ×2 IMPLANT
PAD ARMBOARD 7.5X6 YLW CONV (MISCELLANEOUS) ×4 IMPLANT
POSITIONER HEAD DONUT 9IN (MISCELLANEOUS) ×2 IMPLANT
SEALANT HEMOST PREVELEAK 4ML (HEMOSTASIS) IMPLANT
SEALANT SURG COSEAL 8ML (VASCULAR PRODUCTS) IMPLANT
SET MPS 3-ND DEL (MISCELLANEOUS) IMPLANT
SHEATH PINNACLE 8F 10CM (SHEATH) IMPLANT
SHEATH PROBE COVER 6X72 (BAG) IMPLANT
SPONGE T-LAP 18X18 ~~LOC~~+RFID (SPONGE) ×8 IMPLANT
SPONGE T-LAP 4X18 ~~LOC~~+RFID (SPONGE) ×2 IMPLANT
STRIP CLOSURE SKIN 1/2X4 (GAUZE/BANDAGES/DRESSINGS) IMPLANT
SUT BONE WAX W31G (SUTURE) ×2 IMPLANT
SUT EB EXC GRN/WHT 2-0 V-5 (SUTURE) ×4 IMPLANT
SUT ETHIBON EXCEL 2-0 V-5 (SUTURE) IMPLANT
SUT ETHIBOND V-5 VALVE (SUTURE) IMPLANT
SUT PROLENE 3 0 SH 1 (SUTURE) ×2 IMPLANT
SUT PROLENE 3 0 SH 48 (SUTURE) ×4 IMPLANT
SUT PROLENE 3 0 SH DA (SUTURE) IMPLANT
SUT PROLENE 3 0 SH1 36 (SUTURE) IMPLANT
SUT PROLENE 4-0 RB1 .5 CRCL 36 (SUTURE) ×8 IMPLANT
SUT PROLENE 5 0 C 1 36 (SUTURE) IMPLANT
SUT PROLENE 6 0 C 1 30 (SUTURE) IMPLANT
SUT SILK 1 MH (SUTURE) IMPLANT
SUT SILK 2 0 SH CR/8 (SUTURE) IMPLANT
SUT STEEL 6MS V (SUTURE) IMPLANT
SUT STEEL STERNAL CCS#1 18IN (SUTURE) IMPLANT
SUT STEEL SZ 6 DBL 3X14 BALL (SUTURE) IMPLANT
SUT VIC AB 1 CTX36XBRD ANBCTR (SUTURE) ×4 IMPLANT
SUT VIC AB 2-0 CT1 TAPERPNT 27 (SUTURE) IMPLANT
SUT VIC AB 2-0 CTX 36 (SUTURE) IMPLANT
SUT VIC AB 3-0 SH 27X BRD (SUTURE) IMPLANT
SUT VIC AB 3-0 X1 27 (SUTURE) IMPLANT
SYSTEM SAHARA CHEST DRAIN ATS (WOUND CARE) ×2 IMPLANT
TAPE CLOTH SURG 4X10 WHT LF (GAUZE/BANDAGES/DRESSINGS) IMPLANT
TAPE PAPER 2X10 WHT MICROPORE (GAUZE/BANDAGES/DRESSINGS) IMPLANT
TIE VASCULAR MAXI BLUE 18IN ST (MISCELLANEOUS) ×2 IMPLANT
TOWEL GREEN STERILE (TOWEL DISPOSABLE) ×2 IMPLANT
TOWEL GREEN STERILE FF (TOWEL DISPOSABLE) ×2 IMPLANT
TRAY FOLEY SLVR 14FR TEMP STAT (SET/KITS/TRAYS/PACK) ×2 IMPLANT
TRAY FOLEY SLVR 16FR TEMP STAT (SET/KITS/TRAYS/PACK) IMPLANT
UNDERPAD 30X36 HEAVY ABSORB (UNDERPADS AND DIAPERS) ×2 IMPLANT
VALVE ASCENDING AORTIC 25 (Prosthesis & Implant Heart) IMPLANT
VASCULAR TIE MAXI BLUE 18IN ST (MISCELLANEOUS) ×2
VENT LEFT HEART 12002 (CATHETERS) ×2 IMPLANT
WATER STERILE IRR 1000ML POUR (IV SOLUTION) ×4 IMPLANT
WIRE AMPLATZ SS-J .035X180CM (WIRE) IMPLANT

## 2023-04-26 NOTE — Brief Op Note (Signed)
 04/20/2023 - 04/26/2023  4:05 PM  PATIENT:  Chase Lee  43 y.o. male  PRE-OPERATIVE DIAGNOSIS:  1. Thoracic Aortic Aneurysm 2. History of bicuspid aortic valve (s/p St. Jude Mechanical AVR)  POST-OPERATIVE DIAGNOSIS:  1. Thoracic Aortic Aneurysm 2. History of bicuspid aortic valve (s/p St. Jude Mechanical AVR)  PROCEDURE:  TRANSESOPHAGEAL ECHOCARDIOGRAM (TEE), REDO STERNOTOMY,  REPLACEMENT ASCENDING AORTIC ANEURYSM and ROOT UNDER DEEP HYPOTHERMIC CIRCULATORY ARREST, BENTALL PROCEDURE (USING ONXAAP-25, Serial # [id number], Size 25 mm)  SURGEON:  Surgeons and Role:    Lucas Dorise POUR, MD - Primary  PHYSICIAN ASSISTANT: Kyla Donald PA-C   ASSISTANTS: Luke Mayotte RNFA   ANESTHESIA:   general  EBL:  Per anesthesia, perfusion record  BLOOD ADMINISTERED: Two  FFP and One  PLTS and two Cryo as of 1612  DRAINS:  Chest tubes placed in the mediastinal and pleural spaces    SPECIMEN:  Source of Specimen:  Thoracic aortic aneurysm and St Jude Mechanical valve  DISPOSITION OF SPECIMEN:  PATHOLOGY  COUNTS CORRECT:  YES  DICTATION: .Dragon Dictation  PLAN OF CARE: Admit to inpatient   PATIENT DISPOSITION:  ICU - intubated and hemodynamically stable.   Delay start of Pharmacological VTE agent (>24hrs) due to surgical blood loss or risk of bleeding: yes   BASELINE WEIGHT: 102.4 kg

## 2023-04-26 NOTE — Anesthesia Procedure Notes (Signed)
 Central Venous Catheter Insertion Performed by: Lucious Debby BRAVO, MD, anesthesiologist Start/End2/08/2023 7:15 AM, 04/26/2023 7:17 AM Patient location: Pre-op. Preanesthetic checklist: patient identified, IV checked, risks and benefits discussed, surgical consent, monitors and equipment checked, pre-op evaluation, timeout performed and anesthesia consent Position: Trendelenburg Hand hygiene performed  and maximum sterile barriers used  Total catheter length 10. PA cath was placed.Swan type:thermodilution PA Cath depth:50 Procedure performed without using ultrasound guided technique. Attempts: 1 Patient tolerated the procedure well with no immediate complications.

## 2023-04-26 NOTE — Anesthesia Procedure Notes (Signed)
 Arterial Line Insertion Start/End2/08/2023 7:55 AM, 04/26/2023 8:06 AM Performed by: Lucious Debby BRAVO, MD, anesthesiologist  Patient location: OR. Preanesthetic checklist: patient identified, IV checked, risks and benefits discussed, surgical consent, monitors and equipment checked, pre-op evaluation, timeout performed and anesthesia consent Lidocaine  1% used for infiltration and patient sedated Right, brachial was placed Catheter size: 20 G Hand hygiene performed   Attempts: 4 (Multiple attempts by CRNA and MD on radial artery unsuccessful prior to successsul brachial cannulation) Procedure performed using ultrasound guided technique. Ultrasound Notes:anatomy identified, needle tip was noted to be adjacent to the nerve/plexus identified, no ultrasound evidence of intravascular and/or intraneural injection and image(s) printed for medical record Following insertion, dressing applied and Biopatch. Post procedure assessment: unchanged and normal  Post procedure complications: second provider assisted. Patient tolerated the procedure well with no immediate complications.

## 2023-04-26 NOTE — Anesthesia Procedure Notes (Signed)
 Procedure Name: Intubation Date/Time: 04/26/2023 7:53 AM  Performed by: Delores Dus, CRNAPre-anesthesia Checklist: Patient identified, Emergency Drugs available, Suction available and Patient being monitored Patient Re-evaluated:Patient Re-evaluated prior to induction Oxygen Delivery Method: Circle system utilized Preoxygenation: Pre-oxygenation with 100% oxygen Induction Type: IV induction Ventilation: Mask ventilation without difficulty Laryngoscope Size: Miller and 2 Grade View: Grade I Tube type: Oral Tube size: 8.0 mm Number of attempts: 1 Airway Equipment and Method: Stylet and Oral airway Placement Confirmation: ETT inserted through vocal cords under direct vision, positive ETCO2 and breath sounds checked- equal and bilateral Secured at: 23 cm Tube secured with: Tape Dental Injury: Teeth and Oropharynx as per pre-operative assessment

## 2023-04-26 NOTE — Progress Notes (Signed)
  Echocardiogram Echocardiogram Transesophageal has been performed.  Chase Lee 04/26/2023, 6:16 PM

## 2023-04-26 NOTE — Interval H&P Note (Signed)
 History and Physical Interval Note:  04/26/2023 6:27 AM  Chase Lee  has presented today for surgery, with the diagnosis of TAA.  The various methods of treatment have been discussed with the patient and family. After consideration of risks, benefits and other options for treatment, the patient has consented to  Procedure(s) with comments: REDO STERNOTOMY (N/A) BENTALL PROCEDURE (N/A) - CIRC ARREST REPLACEMENT ASCENDING AORTIC ANEURYSM (N/A) TRANSESOPHAGEAL ECHOCARDIOGRAM (TEE) (N/A) as a surgical intervention.  The patient's history has been reviewed, patient examined, no change in status, stable for surgery.  I have reviewed the patient's chart and labs.  Questions were answered to the patient's satisfaction.     Debborah Alonge K Dawson Albers

## 2023-04-26 NOTE — Transfer of Care (Signed)
 Immediate Anesthesia Transfer of Care Note  Patient: Chase Lee  Procedure(s) Performed: REDO STERNOTOMY BENTALL PROCEDURE USING ON-X ASCENDING AORTIC PROSTHESIS SIZE (Chest) REPLACEMENT ASCENDING AORTIC ANEURYSM USING HEMASHIELD PLATINUM GRAFT SIZE TRANSESOPHAGEAL ECHOCARDIOGRAM (TEE)  Patient Location: SICU  Anesthesia Type:General  Level of Consciousness: Patient remains intubated per anesthesia plan  Airway & Oxygen Therapy: Patient remains intubated per anesthesia plan and Patient placed on Ventilator (see vital sign flow sheet for setting)  Post-op Assessment: Report given to RN and Post -op Vital signs reviewed and stable  Post vital signs: Reviewed and stable  Last Vitals:  Vitals Value Taken Time  BP    Temp    Pulse 80 04/26/23 1824  Resp 19 04/26/23 1824  SpO2 94 % 04/26/23 1824  Vitals shown include unfiled device data.  Last Pain:  Vitals:   04/26/23 0648  TempSrc: Oral  PainSc:       Patients Stated Pain Goal: 0 (04/25/23 2255)  Complications: No notable events documented.

## 2023-04-26 NOTE — Hospital Course (Addendum)
 Referring Provider is Vicky Berg, MD  in ER. Primary Cardiologist is Dr. Stacia  HPI: The patient is a 43 year old gentleman with history of anxiety, hypertension and bicuspid aortic valve disease who underwent an aortic valvuloplasty at age 33 through a sternotomy incision.  He subsequently underwent aortic valve replacement at age 26 with a mechanical valve.  He has been maintained on Coumadin  since.  These were both done in [place]  and the details are unavailable.  He was initially seen by Dr. Malipeddi for cardiology consultation in May 2024.  At that time he was having some shortness of breath as well as bilateral lower extremity edema and some palpitations.  He was started on Lopressor  25 mg twice daily.  An echocardiogram was ordered at that time but not completed.  He was seen back by the nurse practitioner on 03/05/2023 and was felt to be doing well overall.  An echo was ordered again and was done on 04/17/2023 showing severe dilation of the ascending aorta at 7.7 cm with the root measuring 6.2 cm.  The mechanical aortic valve was functioning appropriately with a mean gradient of 13.3 mmHg.  Left ventricular systolic function was normal.  He had a CT scan of the neck on 04/04/2023 due to some headache and blurry vision.  This showed the distal part of the ascending aorta which was measured at 5.7 cm.  No other abnormality was seen.  He was seen back again by Dr. Mallipeddi on 04/05/2023 and was feeling fairly well overall with a blood pressure at that time to 116/68.  An appointment was scheduled for outpatient cardiothoracic surgery consultation.  He was seen in the emergency room yesterday complaining of chest pain and headache and had a CTA of the chest showing a 7.4 cm ascending aortic aneurysm.  The ER physician was concerned because the previous CT scan of the neck was read as a 5.7 cm aneurysm but this was only showing the distal ascending aorta accounting for the discrepancy.  His  BP was 148/86 and this was treated with clonidine  and hydralazine .  [facility] office was called to get him an appointment immediately which we did on 04/20/2023. He was a direct admit to [facility] ICU. Pre operative work up was undertaken and BP was controlled with IV medications. He was on a Heparin  drip (has St. Jude mechanical aortic valve). Dr. Lucas reviewed cardiac CT.discussed the need for redo sternotomy, repair of ascending thoracic aortic aneurysm, hypothermic circulatory arrest via right axillary cannulation and long venous cannulation. Potential risks, benefits, and complications of the surgery were discussed with the patient and he agreed to proceed with surgery. Pre operative carotid duplex US  showed no significant internal carotid artery stenosis bilaterally.  Hospital Course: Patient underwent transesophageal echocardiogram(TEE), redo sternotomy, replacement of ascending aortic aneurysm and root under deep hypothermic circulatory arrest,, Bentall (USING ONXAAP-25, Serial # [id number], Size 25 mm). He was transported from the OR to [facility] ICU in stable condition.  He remained hemodynamically stable.  He did not require any inotropic support.  He was weaned from the ventilator and extubated at around 10:30 PM on day of surgery.  On the first postoperative day, he was maintaining sinus rhythm but with a heart rate in the 60s.  Beta blocking agents were withheld due to relative bradycardia.  The monitoring lines were removed routinely on postop day 1. Lopressor  was titrated as able and Lisinopril  was restarted for better BP control on post op day 2. He was transitioned off the  Insulin  drip. His pre op HGA1C was 6.5. He was started on Coumadin  and daily PT/INT were obtained. Pain control was an issue so Toradol  was added. He was stable for transfer from the ICU to 4E for further convalescence on 02/09.

## 2023-04-26 NOTE — Anesthesia Procedure Notes (Signed)
 Arterial Line Insertion Start/End2/08/2023 7:28 AM Performed by: Lamar Lucie DASEN, CRNA, CRNA  Patient location: Pre-op. Preanesthetic checklist: patient identified, IV checked, site marked, risks and benefits discussed, surgical consent, monitors and equipment checked, pre-op evaluation, timeout performed and anesthesia consent Lidocaine  1% used for infiltration Left, radial was placed Catheter size: 20 G Hand hygiene performed  and maximum sterile barriers used   Attempts: 1 Procedure performed without using ultrasound guided technique. Following insertion, dressing applied and Biopatch. Post procedure assessment: normal and unchanged  Patient tolerated the procedure well with no immediate complications.

## 2023-04-26 NOTE — Procedures (Signed)
 Extubation Procedure Note  Patient Details:   Name: Dakhari Zuver DOB: April 29, 1980 MRN: [id number]   Airway Documentation:    Vent end date: 04/26/23 Vent end time: 2217   Evaluation  O2 sats: stable throughout Complications: No apparent complications Patient did tolerate procedure well. Bilateral Breath Sounds: Diminished   Yes  Positive cuff leak noted prior to extubation  NIF -22 VC 1.1L  Perl Folmar R Annamae Shivley 04/26/2023, 10:25 PM

## 2023-04-26 NOTE — Anesthesia Procedure Notes (Signed)
 Central Venous Catheter Insertion Performed by: Lucious Debby BRAVO, MD, anesthesiologist Start/End2/08/2023 7:07 AM, 04/26/2023 7:17 AM Patient location: Pre-op. Preanesthetic checklist: patient identified, IV checked, risks and benefits discussed, surgical consent, monitors and equipment checked, pre-op evaluation, timeout performed and anesthesia consent Position: Trendelenburg Lidocaine  1% used for infiltration and patient sedated Hand hygiene performed , maximum sterile barriers used  and Seldinger technique used Catheter size: 8.5 Fr Central line was placed.MAC introducer Procedure performed using ultrasound guided technique. Ultrasound Notes:anatomy identified, needle tip was noted to be adjacent to the nerve/plexus identified, no ultrasound evidence of intravascular and/or intraneural injection and image(s) printed for medical record Attempts: 1 Following insertion, line sutured, dressing applied and Biopatch. Post procedure assessment: blood return through all ports, no air and free fluid flow  Patient tolerated the procedure well with no immediate complications.

## 2023-04-26 NOTE — Op Note (Signed)
 CARDIOVASCULAR SURGERY OPERATIVE NOTE  04/26/2023  Surgeon:  Dorise LOIS Fellers, MD  First Assistant: Kyla Donald,  PA-C: An experienced assistant was required given the complexity of this surgery and the standard of surgical care. The assistant was needed for exposure, dissection, suctioning, retraction of delicate tissues and sutures, instrument exchange and for overall help during this procedure.    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 Jude mechanical AVR at age 53 for bicuspid aortic valve disease.   Postoperative Diagnosis:  Same   Procedure:  Redo Median Sternotomy Extracorporeal circulation via right axillary artery and right femoral vein Right axillary cannulation with 8 mm Hemashield graft 4.   Replacement of the ascending aorta (hemi-arch) using a 32 mm Hemashield graft under deep hypothermic circulatory arrest 5.   Removal of old mechanical valve  6.   Bentall Procedure using a 25 mm On-X mechanical valved conduit, reimplantation of both right and left coronary arteries.  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  He has uncontrolled hypertension and has been having episodes of chest discomfort, shortness of breath, headache, and dizziness which prompted evaluation in the emergency room yesterday.  I have personally reviewed his CT scan and the distal ascending aorta just proximal to the innominate artery measures 3.7 cm.  The aortic arch continues to taper down to about 2.6 cm in the proximal descending aorta.  There was an area around the left main and a second area in the mid ascending on the greater curve that looked like chronic focal dissections. He presented to my office with  hypertensive crisis having headache and dizziness.  He requires replacement of the aortic valve/root/ascending aorta under deep hypothermic circulatory arrest with Bentall procedure using mechanical valve conduit and reimplantation of the coronary arteries.  He was admitted directly to the ICU by CCM for tight BP control and had a gated coronary CTA to evaluate the coronaries. This confirmed the focal dissections as noted on the previous CTA and showed no significant coronary disease.  I reviewed his echo and CT images with him and answered all of his questions. I discussed the operative procedure with the patient including alternatives, benefits and risks; including but not limited to bleeding, blood transfusion, infection, stroke, myocardial infarction, graft failure, heart block requiring a permanent pacemaker, organ dysfunction, and death.  Venetia Seip understands and agrees to proceed.        Preparation:  The patient was seen in the preoperative holding area and the correct patient, correct operation were confirmed with the patient after reviewing the medical record and catheterization. The consent was signed by me. Preoperative antibiotics were given. A pulmonary arterial line, left radial arterial line and right brachial arterial line were placed by the anesthesia team. The patient was taken back to the operating room and positioned supine on the operating room table. After being placed under general endotracheal anesthesia by the anesthesia team a foley catheter was placed. The neck, chest, abdomen, and both legs were prepped with betadine soap and solution and draped in the usual sterile manner. A surgical time-out was taken and the correct patient and operative procedure were confirmed with the nursing and anesthesia staff.  TEE:  Performed by Dr. Lucious. This showed the  mechanical aortic valve was functioning well. LV systolic function was normal. RV function was normal, trivial MR, Very large  aortic root and ascending aortic aneurysm.  Venous access for cardiopulmonary bypass:  The right common femoral vein was identified with ultrasound and cannulated with a needle and J-wire. It was dilated with an 56F dilator and a single Perclose placed. Then an 56F sheath was placed over the wire into the vein.  Right axillary artery exposure and cannulation:  A transverse incision was made below the right clavicle. The pectoralis major muscle was split along its fibers and the pectoralis minor muscle was retracted laterally. The brachial plexus was identified and gently retracted laterally to expose the axillary artery. The artery was controlled proximally and distally with vessel loops.   Then a redo median sternotomy was performed. The wires were removed and the sternum opened using the oscillating saw without incident. The brachiocephalic vein was displaced anteriorly by the aneurysm and was lying directly beneath the sternum so I thought it would be safest to go on bypass before doing any further dissection.   The patient was fully heparinized and ACT maintained greater than 400. The axillary artery was clamped proximally and distally with peripheral Debakey clamps. It was opened longitudinally. There was no sign of dissection here. An 8 mm Hemashield dacron graft was anastomosed in an end to side manner using continuous 5-0 prolene suture. CoSeal was applied for hemostasis and the clamp removed. The graft was connected to the arterial end of the bypass circuit.   Resection and grafting of ascending aortic aneurysm:  The patient was placed on cardiopulmonary bypass. The sternum was retracted with bone hooks and the heart and aneurysm were carefully dissected off the back of the sternum. I was able to mobilize the brachiocephalic vein without incident. Dissection was performed to expose the right atrium and ascending aorta.  The innominate artery was exposed and encircled with a tape. Systemic  cooling was begun with a goal temperature of 22 degrees centigrade by bladder temperature probes. A retrograde cardioplegia cannula was placed through the right atrium into the coronary sinus without difficulty. An antegrade cardioplegia/vent cannula was placed in the ascending aorta. I could not expose the right pulmonary veins due to dense adhesions and multiple pledgets in this area and decided to vent the LV directed through the aortic valve. As soon as the heart fibrillated the PA pressures immediately began increasing. Therefore the aorta was cross-clamped distally and cold antegrade KBC cardioplegia was given.  Additional doses of cold retrograde KBC cardioplegia were given at 60 minute intervals. After 20 minutes of cooling the target temperature of 22 degrees centigrade was reached. Cerebral oximetry was 70% bilaterally. BIS was zero. The patient was given propofol  and 125 mg of Solumedrol. The head was packed in ice. The bed was placed in steep trendelenburg. Circulatory arrest was begun and the blood volume emptied into the venous reservoir. The innominate artery was clamped and continuous antegrade cerebral perfusion was begun. The cross clamp was removed. The aorta was transected just proximal to the innominate artery beveling the resection out along the undersurface of the aortic arch (Hemiarch replacement). The aortic diameter was measured at 32 mm here. A 32 x 10 mm Hemasheild Platinum vascular graft was prepared. ( REF # [id number] P0, Lot [id number], SN [id number]). It was anastomosed to the aortic arch in an end to end manner using 3-0 prolene continuous suture with a felt strip to reinforce the anastomisis. A light coating of [place]  was applied to seal needle holes. The aortic graft was cross-clamped and full CPB support was resumed. Circulatory arrest time was 31 minutes. Antegrade cerebral perfusion time was 24 minutes.   Bentall Procedure:   The ascending aorta was mobilized from the  right pulmonary artery and main PA. This was tedious due to the size of the aneurysm and adhesions to surrounding structures. It was opened longitudinally and the valve inspected. It was unremarkable. The right and left coronary arteries were removed from the aortic root with a button of aortic wall around the ostia.  They were retracted carefully out of the way with stay sutures to prevent rotation. The old mechanical valve was removed taking care to remove all particulate debri.The annulus was sized and a 25 mm On-X mechanical valved conduit was chosen. ( Model # [place], Serial # [id number]). A series of pledgetted 2-0 Ethibond horizontal mattress sutures were placed around the annulus with the pledgets in a sub-annular position. The sutures were placed through the valve sewing ring  and the valve was lowered into place and the sutures tied . The valve seated nicely.  Small openings were made in the graft for the coronary anastomoses using a thermal cautery. Then the left and right coronary buttons were anastomosed to the graft in an end to side manner using continuous 5-0 prolene suture. A light coating of CoSeal was applied to each anastomosis for hemostasis. The two grafts were then cut to the appropriate length and anastomosed end to end using continuous 3-0 prolene suture. CoSeal was applied to seal the needle holes in the grafts. A vent cannula was placed into the graft to remove any air. Deairing maneuvers were performed and the bed placed in trendelenburg position.   Completion:   The patient was rewarmed to 37 degrees Centigrade. The crossclamp was removed with a time of 230 minutes. There was no spontaneous rhythm. The position of the grafts was satisfactory. The vascular anastomoses all appeared hemostatic. Two temporary epicardial pacing wires were placed on the right atrium and two on the right ventricle. The heart was paced DDD at 80. The patient was weaned from CPB without difficulty on no  inotropes. CPB time was 336 minutes. Cardiac output was 5 LPM. TEE showed a normal functioning aortic valve prosthesis with no AI. There was unchanged trivial MR. LV function appeared normal. Heparin  was fully reversed with protamine  and the venous cannula removed and Perclose tied. The patient was coagulopathic with low platelets and fibrinogen . I gave 2 units FFP, 2 units cryo, and 1 unit of platelets. Hemostasis was achieved. The axillary artery graft was ligated with a #1 silk tie and suture ligated with a 3-0 Prolene pledgetted horizontal mattress suture. Mediastinal and bilateral pleural drainage tubes were placed. The sternum was closed with double #6 stainless steel wires. The fascia was closed with continuous # 1 vicryl suture. The subcutaneous tissue was closed with 2-0 vicryl continuous suture. The skin was closed with 3-0 vicryl subcuticular suture. The axillary incision was closed in a similar manner. All sponge, needle, and instrument counts were reported correct at the end of the case. Dry sterile dressings were placed over the incisions and around the chest tubes which were connected to pleurevac suction. The patient was then transported to the surgical intensive care unit in stable condition.

## 2023-04-27 ENCOUNTER — Inpatient Hospital Stay (HOSPITAL_COMMUNITY): Payer: 59

## 2023-04-27 ENCOUNTER — Telehealth (INDEPENDENT_AMBULATORY_CARE_PROVIDER_SITE_OTHER): Payer: No Typology Code available for payment source | Admitting: Adult Health

## 2023-04-27 ENCOUNTER — Encounter (HOSPITAL_COMMUNITY): Payer: Self-pay | Admitting: Surgery

## 2023-04-27 LAB — HEMOGLOBIN A1C
Hgb A1c MFr Bld: 6.5 % — ABNORMAL HIGH (ref 4.8–5.6)
Mean Plasma Glucose: 139.85 mg/dL

## 2023-04-27 LAB — CBC
HCT: 30.8 % — ABNORMAL LOW (ref 39.0–52.0)
HCT: 31.7 % — ABNORMAL LOW (ref 39.0–52.0)
HCT: 33 % — ABNORMAL LOW (ref 39.0–52.0)
Hemoglobin: 10.2 g/dL — ABNORMAL LOW (ref 13.0–17.0)
Hemoglobin: 10.8 g/dL — ABNORMAL LOW (ref 13.0–17.0)
Hemoglobin: 10.9 g/dL — ABNORMAL LOW (ref 13.0–17.0)
MCH: 29.4 pg (ref 26.0–34.0)
MCH: 29.5 pg (ref 26.0–34.0)
MCH: 30 pg (ref 26.0–34.0)
MCHC: 33 g/dL (ref 30.0–36.0)
MCHC: 33.1 g/dL (ref 30.0–36.0)
MCHC: 34.1 g/dL (ref 30.0–36.0)
MCV: 88.1 fL (ref 80.0–100.0)
MCV: 88.8 fL (ref 80.0–100.0)
MCV: 89.4 fL (ref 80.0–100.0)
Platelets: 123 10*3/uL — ABNORMAL LOW (ref 150–400)
Platelets: 94 10*3/uL — ABNORMAL LOW (ref 150–400)
Platelets: 99 10*3/uL — ABNORMAL LOW (ref 150–400)
RBC: 3.47 MIL/uL — ABNORMAL LOW (ref 4.22–5.81)
RBC: 3.6 MIL/uL — ABNORMAL LOW (ref 4.22–5.81)
RBC: 3.69 MIL/uL — ABNORMAL LOW (ref 4.22–5.81)
RDW: 14.1 % (ref 11.5–15.5)
RDW: 14.5 % (ref 11.5–15.5)
RDW: 14.7 % (ref 11.5–15.5)
WBC: 10.9 10*3/uL — ABNORMAL HIGH (ref 4.0–10.5)
WBC: 13.6 10*3/uL — ABNORMAL HIGH (ref 4.0–10.5)
WBC: 16 10*3/uL — ABNORMAL HIGH (ref 4.0–10.5)
nRBC: 0 % (ref 0.0–0.2)
nRBC: 0 % (ref 0.0–0.2)
nRBC: 0 % (ref 0.0–0.2)

## 2023-04-27 LAB — BASIC METABOLIC PANEL
Anion gap: 8 (ref 5–15)
Anion gap: 6 (ref 5–15)
Anion gap: 10 (ref 5–15)
BUN: 19 mg/dL (ref 6–20)
BUN: 18 mg/dL (ref 6–20)
BUN: 18 mg/dL (ref 6–20)
CO2: 22 mmol/L (ref 22–32)
CO2: 23 mmol/L (ref 22–32)
CO2: 26 mmol/L (ref 22–32)
Calcium: 7.5 mg/dL — ABNORMAL LOW (ref 8.9–10.3)
Calcium: 7.5 mg/dL — ABNORMAL LOW (ref 8.9–10.3)
Calcium: 8.2 mg/dL — ABNORMAL LOW (ref 8.9–10.3)
Chloride: 103 mmol/L (ref 98–111)
Chloride: 104 mmol/L (ref 98–111)
Chloride: 97 mmol/L — ABNORMAL LOW (ref 98–111)
Creatinine, Ser: 1.24 mg/dL (ref 0.61–1.24)
Creatinine, Ser: 1.04 mg/dL (ref 0.61–1.24)
Creatinine, Ser: 1.03 mg/dL (ref 0.61–1.24)
GFR, Estimated: >60 mL/min (ref >60)
GFR, Estimated: >60 mL/min (ref >60)
GFR, Estimated: >60 mL/min (ref >60)
Glucose, Bld: 176 mg/dL — ABNORMAL HIGH (ref 70–99)
Glucose, Bld: 113 mg/dL — ABNORMAL HIGH (ref 70–99)
Glucose, Bld: 146 mg/dL — ABNORMAL HIGH (ref 70–99)
Potassium: 4.9 mmol/L (ref 3.5–5.1)
Potassium: 5.1 mmol/L (ref 3.5–5.1)
Potassium: 4.4 mmol/L (ref 3.5–5.1)
Sodium: 133 mmol/L — ABNORMAL LOW (ref 135–145)
Sodium: 133 mmol/L — ABNORMAL LOW (ref 135–145)
Sodium: 133 mmol/L — ABNORMAL LOW (ref 135–145)

## 2023-04-27 LAB — BPAM FFP
Blood Product Expiration Date: 202502082359
Blood Product Expiration Date: 202502082359
ISSUE DATE / TIME: 202502061551
ISSUE DATE / TIME: 202502061551
Unit Type and Rh: 7300
Unit Type and Rh: 7300

## 2023-04-27 LAB — BPAM PLATELET PHERESIS
Blood Product Expiration Date: 202502072359
ISSUE DATE / TIME: 202502061556
Unit Type and Rh: 5100

## 2023-04-27 LAB — GLUCOSE, CAPILLARY
Glucose-Capillary: 117 mg/dL — ABNORMAL HIGH (ref 70–99)
Glucose-Capillary: 118 mg/dL — ABNORMAL HIGH (ref 70–99)
Glucose-Capillary: 118 mg/dL — ABNORMAL HIGH (ref 70–99)
Glucose-Capillary: 124 mg/dL — ABNORMAL HIGH (ref 70–99)
Glucose-Capillary: 125 mg/dL — ABNORMAL HIGH (ref 70–99)
Glucose-Capillary: 137 mg/dL — ABNORMAL HIGH (ref 70–99)
Glucose-Capillary: 140 mg/dL — ABNORMAL HIGH (ref 70–99)
Glucose-Capillary: 144 mg/dL — ABNORMAL HIGH (ref 70–99)
Glucose-Capillary: 153 mg/dL — ABNORMAL HIGH (ref 70–99)
Glucose-Capillary: 171 mg/dL — ABNORMAL HIGH (ref 70–99)

## 2023-04-27 LAB — POCT I-STAT 7, (LYTES, BLD GAS, ICA,H+H)
Acid-base deficit: 4 mmol/L — ABNORMAL HIGH (ref 0.0–2.0)
Bicarbonate: 21.3 mmol/L (ref 20.0–28.0)
Calcium, Ion: 1.06 mmol/L — ABNORMAL LOW (ref 1.15–1.40)
HCT: 29 % — ABNORMAL LOW (ref 39.0–52.0)
Hemoglobin: 9.9 g/dL — ABNORMAL LOW (ref 13.0–17.0)
O2 Saturation: 95 %
Patient temperature: 36.8
Potassium: 4.5 mmol/L (ref 3.5–5.1)
Sodium: 135 mmol/L (ref 135–145)
TCO2: 22 mmol/L (ref 22–32)
pCO2 arterial: 39.1 mmHg (ref 32–48)
pH, Arterial: 7.343 — ABNORMAL LOW (ref 7.35–7.45)
pO2, Arterial: 80 mmHg — ABNORMAL LOW (ref 83–108)

## 2023-04-27 LAB — MAGNESIUM
Magnesium: 3.3 mg/dL — ABNORMAL HIGH (ref 1.7–2.4)
Magnesium: 2.9 mg/dL — ABNORMAL HIGH (ref 1.7–2.4)
Magnesium: 2.7 mg/dL — ABNORMAL HIGH (ref 1.7–2.4)

## 2023-04-27 LAB — BPAM CRYOPRECIPITATE
Blood Product Expiration Date: 202502082359
Blood Product Expiration Date: 202502082359
ISSUE DATE / TIME: 202502061554
ISSUE DATE / TIME: 202502061554
Unit Type and Rh: 5100
Unit Type and Rh: 5100

## 2023-04-27 LAB — PREPARE PLATELET PHERESIS: Unit division: 0

## 2023-04-27 LAB — PREPARE CRYOPRECIPITATE
Unit division: 0
Unit division: 0

## 2023-04-27 LAB — PREPARE FRESH FROZEN PLASMA

## 2023-04-27 MED ORDER — ALPRAZOLAM 0.5 MG PO TABS
1.0000 mg | ORAL_TABLET | Freq: Three times a day (TID) | ORAL | Status: DC | PRN
  Administered 2023-04-27 – 2023-04-30 (×11): 1 mg via ORAL
  Filled 2023-04-27 (×12): qty 2

## 2023-04-27 MED ORDER — INSULIN GLARGINE-YFGN 100 UNIT/ML ~~LOC~~ SOLN
15.0000 [IU] | Freq: Every day | SUBCUTANEOUS | Status: DC
  Administered 2023-04-27 – 2023-04-28 (×2): 15 [IU] via SUBCUTANEOUS
  Filled 2023-04-27 (×3): qty 0.15

## 2023-04-27 MED ORDER — FUROSEMIDE 10 MG/ML IJ SOLN
40.0000 mg | Freq: Two times a day (BID) | INTRAMUSCULAR | Status: AC
  Administered 2023-04-27 (×2): 40 mg via INTRAVENOUS
  Filled 2023-04-27: qty 4

## 2023-04-27 MED ORDER — METOPROLOL TARTRATE 12.5 MG HALF TABLET
12.5000 mg | ORAL_TABLET | Freq: Two times a day (BID) | ORAL | Status: DC
  Administered 2023-04-27: 12.5 mg via ORAL
  Filled 2023-04-27 (×2): qty 1

## 2023-04-27 MED ORDER — POTASSIUM CHLORIDE CRYS ER 20 MEQ PO TBCR
20.0000 meq | EXTENDED_RELEASE_TABLET | Freq: Once | ORAL | Status: AC
  Administered 2023-04-27: 20 meq via ORAL
  Filled 2023-04-27: qty 1

## 2023-04-27 MED ORDER — INSULIN ASPART 100 UNIT/ML IJ SOLN
0.0000 [IU] | INTRAMUSCULAR | Status: DC
  Administered 2023-04-27 (×3): 2 [IU] via SUBCUTANEOUS
  Administered 2023-04-27: 0 [IU] via SUBCUTANEOUS
  Administered 2023-04-28: 4 [IU] via SUBCUTANEOUS

## 2023-04-27 MED FILL — Lidocaine HCl Local Preservative Free (PF) Inj 2%: INTRAMUSCULAR | Qty: 14 | Status: AC

## 2023-04-27 MED FILL — Potassium Chloride Inj 2 mEq/ML: INTRAVENOUS | Qty: 40 | Status: AC

## 2023-04-27 MED FILL — Thrombin (Recombinant) For Soln 20000 Unit: CUTANEOUS | Qty: 1 | Status: AC

## 2023-04-27 MED FILL — Heparin Sodium (Porcine) Inj 1000 Unit/ML: Qty: 1000 | Status: AC

## 2023-04-27 NOTE — Progress Notes (Signed)
 PICC line was removed per order with no complications.  Pt unable to lie supine but suspended inspiration during removal.  Pressure held to achieve hemostasis.  Vaseline/gauze/tegaderm applied.  Patient education provided regarding lifting restrictions, site care, and signs of infection.  Pt verbalized understanding and had no questions.

## 2023-04-27 NOTE — Plan of Care (Signed)

## 2023-04-27 NOTE — Progress Notes (Addendum)
 Patient ID: Chase Lee, male   DOB: [birth year], 43 y.o.   MRN: [id number]  TCTS evening rounds:  Hemodynamically stable in sinus rhythm.  Excellent diuresis today. -3800 cc so far.  BMET    Component Value Date/Time   NA 133 (L) 04/27/2023 1750   K 4.4 04/27/2023 1750   CL 97 (L) 04/27/2023 1750   CO2 26 04/27/2023 1750   GLUCOSE 146 (H) 04/27/2023 1750   BUN 18 04/27/2023 1750   CREATININE 1.03 04/27/2023 1750   CALCIUM  8.2 (L) 04/27/2023 1750   GFRNONAA >60 04/27/2023 1750     CT output low. Will remove the tubes this pm since it will help his chest wall pain.

## 2023-04-27 NOTE — Progress Notes (Signed)
 1 Day Post-Op Procedure(s) (LRB): REDO STERNOTOMY (N/A) BENTALL PROCEDURE USING ON-X ASCENDING AORTIC PROSTHESIS SIZE (N/A) REPLACEMENT ASCENDING AORTIC ANEURYSM USING HEMASHIELD PLATINUM GRAFT SIZE (N/A) TRANSESOPHAGEAL ECHOCARDIOGRAM (TEE) (N/A) Subjective:  Only complaint is of chest incisional pain. Hard to take deep breaths.  Objective: Vital signs in last 24 hours: Temp:  [96.6 F (35.9 C)-99 F (37.2 C)] 98.8 F (37.1 C) (02/07 0630) Pulse Rate:  [54-81] 80 (02/07 0630) Cardiac Rhythm: Atrial paced (02/06 2220) Resp:  [0-27] 22 (02/07 0630) BP: (98-137)/(68-73) 98/68 (02/06 1830) SpO2:  [88 %-100 %] 94 % (02/07 0630) FiO2 (%):  [40 %-100 %] 40 % (02/06 2140) Weight:  [107.9 kg] 107.9 kg (02/07 0500)  Hemodynamic parameters for last 24 hours: PAP: (11-47)/(-3-21) 11/2 CVP:  [18 mmHg] 18 mmHg CO:  [4.2 L/min-6.1 L/min] 6.1 L/min CI:  [2 L/min/m2-2.9 L/min/m2] 2.9 L/min/m2  Intake/Output from previous day: 02/06 0701 - 02/07 0700 In: 4793.2 [I.V.:2150.2; Blood:1556; NG/GT:90; IV Piggyback:997.1] Out: 5557 [Urine:4165; Blood:1032; Chest Tube:360] Intake/Output this shift: Total I/O In: 1817.2 [I.V.:1030.2; NG/GT:90; IV Piggyback:697.1] Out: 1500 [Urine:1230; Chest Tube:270]  General appearance: alert and cooperative Neurologic: intact Heart: regular rate and rhythm, crisp mechanical valve click, no murmur Lungs: clear to auscultation bilaterally Abdomen: soft, non-tender; bowel sounds normal Extremities: edema mild Wound: dressings dry  Lab Results: Recent Labs    04/27/23 0028 04/27/23 0206 04/27/23 0434  WBC 10.9*  --  13.6*  HGB 10.8* 9.9* 10.2*  HCT 31.7* 29.0* 30.8*  PLT 94*  --  99*   BMET:  Recent Labs    04/27/23 0028 04/27/23 0206 04/27/23 0434  NA 133* 135 133*  K 4.9 4.5 5.1  CL 103  --  104  CO2 22  --  23  GLUCOSE 176*  --  113*  BUN 19  --  18  CREATININE 1.24  --  1.04  CALCIUM  7.5*  --  7.5*    PT/INR:  Recent Labs     04/26/23 1827  LABPROT 16.6*  INR 1.3*   ABG    Component Value Date/Time   PHART 7.343 (L) 04/27/2023 0206   HCO3 21.3 04/27/2023 0206   TCO2 22 04/27/2023 0206   ACIDBASEDEF 4.0 (H) 04/27/2023 0206   O2SAT 95 04/27/2023 0206   CBG (last 3)  Recent Labs    04/27/23 0302 04/27/23 0404 04/27/23 0601  GLUCAP 124* 118* 118*   CXR: pending  ECG: sinus 64, 1st degree AV block, LBBB present preop.  Assessment/Plan: S/P Procedure(s) (LRB): REDO STERNOTOMY (N/A) BENTALL PROCEDURE USING ON-X ASCENDING AORTIC PROSTHESIS SIZE (N/A) REPLACEMENT ASCENDING AORTIC ANEURYSM USING HEMASHIELD PLATINUM GRAFT SIZE (N/A) TRANSESOPHAGEAL ECHOCARDIOGRAM (TEE) (N/A)  POD 1 Hemodynamically stable in sinus rhythm. Will  hold off on beta blocker today with HR 60's and reevaluate tomorrow. Use Cleviprex  for hypertension as needed.  Wt is 14 lbs over preop. Start diuresis.  DC arterial lines, swan.  Keep chest tubes in this am and probably remove later today. Not much drainage.  Glucose under adequate control. Preop Hgb A1c was 6.5 on no meds so he probably has some early diabetes. Received steroids for circ arrest. Will transition to Levemir  and SSI today.  Will start Coumadin  tomorrow.  IS, OOB.   LOS: 7 days    Chase Lee 04/27/2023

## 2023-04-28 ENCOUNTER — Inpatient Hospital Stay (HOSPITAL_COMMUNITY): Payer: 59

## 2023-04-28 LAB — CBC
HCT: 33.5 % — ABNORMAL LOW (ref 39.0–52.0)
Hemoglobin: 11.2 g/dL — ABNORMAL LOW (ref 13.0–17.0)
MCH: 30 pg (ref 26.0–34.0)
MCHC: 33.4 g/dL (ref 30.0–36.0)
MCV: 89.8 fL (ref 80.0–100.0)
Platelets: 132 10*3/uL — ABNORMAL LOW (ref 150–400)
RBC: 3.73 MIL/uL — ABNORMAL LOW (ref 4.22–5.81)
RDW: 14.6 % (ref 11.5–15.5)
WBC: 19.7 10*3/uL — ABNORMAL HIGH (ref 4.0–10.5)
nRBC: 0 % (ref 0.0–0.2)

## 2023-04-28 LAB — GLUCOSE, CAPILLARY
Glucose-Capillary: 134 mg/dL — ABNORMAL HIGH (ref 70–99)
Glucose-Capillary: 136 mg/dL — ABNORMAL HIGH (ref 70–99)
Glucose-Capillary: 168 mg/dL — ABNORMAL HIGH (ref 70–99)
Glucose-Capillary: 171 mg/dL — ABNORMAL HIGH (ref 70–99)
Glucose-Capillary: 122 mg/dL — ABNORMAL HIGH (ref 70–99)
Glucose-Capillary: 129 mg/dL — ABNORMAL HIGH (ref 70–99)

## 2023-04-28 LAB — BASIC METABOLIC PANEL
Anion gap: 14 (ref 5–15)
BUN: 18 mg/dL (ref 6–20)
CO2: 25 mmol/L (ref 22–32)
Calcium: 8.7 mg/dL — ABNORMAL LOW (ref 8.9–10.3)
Chloride: 94 mmol/L — ABNORMAL LOW (ref 98–111)
Creatinine, Ser: 0.86 mg/dL (ref 0.61–1.24)
GFR, Estimated: >60 mL/min (ref >60)
Glucose, Bld: 166 mg/dL — ABNORMAL HIGH (ref 70–99)
Potassium: 4.5 mmol/L (ref 3.5–5.1)
Sodium: 133 mmol/L — ABNORMAL LOW (ref 135–145)

## 2023-04-28 MED ORDER — WARFARIN - PHYSICIAN DOSING INPATIENT: Freq: Every day | Status: DC

## 2023-04-28 MED ORDER — LISINOPRIL 20 MG PO TABS
20.0000 mg | ORAL_TABLET | Freq: Every day | ORAL | Status: DC
  Administered 2023-04-28 – 2023-05-04 (×7): 20 mg via ORAL
  Filled 2023-04-28 (×7): qty 1

## 2023-04-28 MED ORDER — ASPIRIN 81 MG PO TBEC
81.0000 mg | DELAYED_RELEASE_TABLET | Freq: Every day | ORAL | Status: DC
  Administered 2023-04-29: 81 mg via ORAL
  Filled 2023-04-28: qty 1

## 2023-04-28 MED ORDER — METOPROLOL TARTRATE 25 MG PO TABS
25.0000 mg | ORAL_TABLET | Freq: Two times a day (BID) | ORAL | Status: DC
  Administered 2023-04-28 – 2023-05-04 (×12): 25 mg via ORAL
  Filled 2023-04-28 (×13): qty 1

## 2023-04-28 MED ORDER — ASPIRIN 81 MG PO CHEW: 81.0000 mg | CHEWABLE_TABLET | Freq: Every day | ORAL | Status: DC

## 2023-04-28 MED ORDER — INSULIN ASPART 100 UNIT/ML IJ SOLN
0.0000 [IU] | Freq: Three times a day (TID) | INTRAMUSCULAR | Status: DC
  Administered 2023-04-28: 2 [IU] via SUBCUTANEOUS
  Administered 2023-04-28: 4 [IU] via SUBCUTANEOUS

## 2023-04-28 MED ORDER — POTASSIUM CHLORIDE CRYS ER 20 MEQ PO TBCR
20.0000 meq | EXTENDED_RELEASE_TABLET | Freq: Every day | ORAL | Status: DC
  Administered 2023-04-28 – 2023-04-29 (×2): 20 meq via ORAL
  Filled 2023-04-28 (×2): qty 1

## 2023-04-28 MED ORDER — KETOROLAC TROMETHAMINE 15 MG/ML IJ SOLN
15.0000 mg | Freq: Four times a day (QID) | INTRAMUSCULAR | Status: DC | PRN
  Administered 2023-04-28 – 2023-04-29 (×5): 15 mg via INTRAVENOUS
  Filled 2023-04-28 (×5): qty 1

## 2023-04-28 MED ORDER — WARFARIN SODIUM 2.5 MG PO TABS
2.5000 mg | ORAL_TABLET | Freq: Once | ORAL | Status: AC
  Administered 2023-04-28: 2.5 mg via ORAL
  Filled 2023-04-28: qty 1

## 2023-04-28 MED ORDER — FUROSEMIDE 40 MG PO TABS
40.0000 mg | ORAL_TABLET | Freq: Every day | ORAL | Status: DC
  Administered 2023-04-28 – 2023-04-29 (×2): 40 mg via ORAL
  Filled 2023-04-28 (×2): qty 1

## 2023-04-28 MED ORDER — ENSURE ENLIVE PO LIQD: 237.0000 mL | Freq: Two times a day (BID) | ORAL | Status: DC

## 2023-04-28 MED ORDER — LIDOCAINE HCL (PF) 1 % IJ SOLN
INTRAMUSCULAR | Status: AC
  Filled 2023-04-28: qty 5

## 2023-04-28 MED ORDER — FE FUM-VIT C-VIT B12-FA 460-60-0.01-1 MG PO CAPS
1.0000 | ORAL_CAPSULE | Freq: Every day | ORAL | Status: DC
  Administered 2023-04-29 – 2023-05-04 (×6): 1 via ORAL
  Filled 2023-04-28 (×6): qty 1

## 2023-04-28 MED ORDER — LIDOCAINE-EPINEPHRINE (PF) 2 %-1:200000 IJ SOLN
INTRAMUSCULAR | Status: AC
Start: 2023-04-28 — End: 2023-04-28
  Administered 2023-04-28: 20 mL
  Filled 2023-04-28: qty 20

## 2023-04-28 NOTE — Plan of Care (Signed)

## 2023-04-28 NOTE — Progress Notes (Signed)
 2 Days Post-Op Procedure(s) (LRB): REDO STERNOTOMY (N/A) BENTALL PROCEDURE USING ON-X ASCENDING AORTIC PROSTHESIS SIZE (N/A) REPLACEMENT ASCENDING AORTIC ANEURYSM USING HEMASHIELD PLATINUM GRAFT SIZE (N/A) TRANSESOPHAGEAL ECHOCARDIOGRAM (TEE) (N/A) Subjective: Complains of pain.  Objective: Vital signs in last 24 hours: Temp:  [97.7 F (36.5 C)-98.9 F (37.2 C)] 98.5 F (36.9 C) (02/08 0758) Pulse Rate:  [69-96] 92 (02/08 0900) Cardiac Rhythm: Atrial paced (02/08 0600) Resp:  [10-29] 26 (02/08 0900) BP: (123-156)/(68-90) 126/81 (02/08 0900) SpO2:  [92 %-100 %] 96 % (02/08 0900) FiO2 (%):  [3 %] 3 % (02/08 0300) Weight:  [106.8 kg] 106.8 kg (02/08 0431)  Hemodynamic parameters for last 24 hours:    Intake/Output from previous day: 02/07 0701 - 02/08 0700 In: 1180.5 [P.O.:660; I.V.:220.5; IV Piggyback:300] Out: 7012 [Urine:6920; Chest Tube:92] Intake/Output this shift: No intake/output data recorded.  General appearance: alert and cooperative Neurologic: intact Heart: regular rate and rhythm Lungs: clear to auscultation bilaterally Extremities: edema mild Wound: chest incisions healing well.  Lab Results: Recent Labs    04/27/23 1750 04/28/23 0345  WBC 16.0* 19.7*  HGB 10.9* 11.2*  HCT 33.0* 33.5*  PLT 123* 132*   BMET:  Recent Labs    04/27/23 1750 04/28/23 0345  NA 133* 133*  K 4.4 4.5  CL 97* 94*  CO2 26 25  GLUCOSE 146* 166*  BUN 18 18  CREATININE 1.03 0.86  CALCIUM  8.2* 8.7*    PT/INR:  Recent Labs    04/26/23 1827  LABPROT 16.6*  INR 1.3*   ABG    Component Value Date/Time   PHART 7.343 (L) 04/27/2023 0206   HCO3 21.3 04/27/2023 0206   TCO2 22 04/27/2023 0206   ACIDBASEDEF 4.0 (H) 04/27/2023 0206   O2SAT 95 04/27/2023 0206   CBG (last 3)  Recent Labs    04/27/23 2337 04/28/23 0324 04/28/23 0756  GLUCAP 137* 136* 168*   CXR: mild left base atelectasis  Assessment/Plan: S/P Procedure(s) (LRB): REDO STERNOTOMY  (N/A) BENTALL PROCEDURE USING ON-X ASCENDING AORTIC PROSTHESIS SIZE (N/A) REPLACEMENT ASCENDING AORTIC ANEURYSM USING HEMASHIELD PLATINUM GRAFT SIZE (N/A) TRANSESOPHAGEAL ECHOCARDIOGRAM (TEE) (N/A)  POD 2  Hemodynamically stable in sinus rhythm 90's. Will increase Lopressor  to 25 bid and resume lisinopril  20.   -5800 cc yesterday but wt only down about 2 lbs so I doubt that is accurate. Will continue po diuretic.  DC sleeve and foley.  Start Coumadin  2.5 today. He was on 4 mg daily preop. Decrease ASA to 81 mg.  Will give Toradol  for a couple days for pain control.  Glucose under adequate control. Continue SEMGLEE  AND SSI for a couple more days.  IS, OOB, mobilize.   LOS: 8 days    Chase Lee 04/28/2023

## 2023-04-28 NOTE — Progress Notes (Signed)
 Patient ID: Chase Lee, male   DOB: [birth year], 42 y.o.   MRN: [id number]  Hemodynamically stable, sinus 90's  Diuresing well.  Pain seems better with Toradol  and more alert.

## 2023-04-28 NOTE — Plan of Care (Signed)
  Problem: Education: Goal: Knowledge of General Education information will improve Description: Including pain rating scale, medication(s)/side effects and non-pharmacologic comfort measures 04/28/2023 1632 by Evalyn Delon ORN, RN Outcome: Progressing 04/28/2023 1631 by Evalyn Delon ORN, RN Outcome: Progressing   Problem: Clinical Measurements: Goal: Ability to maintain clinical measurements within normal limits will improve 04/28/2023 1632 by Evalyn Delon ORN, RN Outcome: Progressing 04/28/2023 1631 by Evalyn Delon ORN, RN Outcome: Progressing Goal: Will remain free from infection 04/28/2023 1632 by Evalyn Delon ORN, RN Outcome: Progressing 04/28/2023 1631 by Evalyn Delon ORN, RN Outcome: Progressing Goal: Diagnostic test results will improve 04/28/2023 1632 by Evalyn Delon ORN, RN Outcome: Progressing 04/28/2023 1631 by Evalyn Delon ORN, RN Outcome: Progressing Goal: Respiratory complications will improve 04/28/2023 1632 by Evalyn Delon ORN, RN Outcome: Progressing 04/28/2023 1631 by Evalyn Delon ORN, RN Outcome: Progressing Goal: Cardiovascular complication will be avoided 04/28/2023 1632 by Evalyn Delon ORN, RN Outcome: Progressing 04/28/2023 1631 by Evalyn Delon ORN, RN Outcome: Progressing   Problem: Activity: Goal: Risk for activity intolerance will decrease 04/28/2023 1632 by Evalyn Delon ORN, RN Outcome: Progressing 04/28/2023 1631 by Evalyn Delon ORN, RN Outcome: Progressing   Problem: Nutrition: Goal: Adequate nutrition will be maintained 04/28/2023 1632 by Evalyn Delon ORN, RN Outcome: Progressing 04/28/2023 1631 by Evalyn Delon ORN, RN Outcome: Progressing   Problem: Coping: Goal: Level of anxiety will decrease 04/28/2023 1632 by Evalyn Delon ORN, RN Outcome: Progressing 04/28/2023 1631 by Evalyn Delon ORN, RN Outcome: Progressing   Problem: Pain Managment: Goal: General experience of comfort will improve and/or be  controlled 04/28/2023 1632 by Evalyn Delon ORN, RN Outcome: Progressing 04/28/2023 1631 by Evalyn Delon ORN, RN Outcome: Progressing   Problem: Safety: Goal: Ability to remain free from injury will improve 04/28/2023 1632 by Evalyn Delon ORN, RN Outcome: Progressing 04/28/2023 1631 by Evalyn Delon ORN, RN Outcome: Progressing

## 2023-04-29 ENCOUNTER — Other Ambulatory Visit: Payer: Self-pay | Admitting: Surgery

## 2023-04-29 LAB — TYPE AND SCREEN
ABO/RH(D): O POS
Antibody Screen: NEGATIVE
Unit division: 0
Unit division: 0
Unit division: 0
Unit division: 0
Unit division: 0
Unit division: 0

## 2023-04-29 LAB — CBC
HCT: 30.8 % — ABNORMAL LOW (ref 39.0–52.0)
Hemoglobin: 10.4 g/dL — ABNORMAL LOW (ref 13.0–17.0)
MCH: 30.2 pg (ref 26.0–34.0)
MCHC: 33.8 g/dL (ref 30.0–36.0)
MCV: 89.5 fL (ref 80.0–100.0)
Platelets: 127 10*3/uL — ABNORMAL LOW (ref 150–400)
RBC: 3.44 MIL/uL — ABNORMAL LOW (ref 4.22–5.81)
RDW: 14.4 % (ref 11.5–15.5)
WBC: 17.7 10*3/uL — ABNORMAL HIGH (ref 4.0–10.5)
nRBC: 0 % (ref 0.0–0.2)

## 2023-04-29 LAB — BPAM RBC
Blood Product Expiration Date: 202503052359
Blood Product Expiration Date: 202503082359
Blood Product Expiration Date: 202503082359
Blood Product Expiration Date: 202503092359
Blood Product Expiration Date: 202503092359
Blood Product Expiration Date: 202503092359
ISSUE DATE / TIME: 202502070317
ISSUE DATE / TIME: 202502070559
ISSUE DATE / TIME: 202502070835
ISSUE DATE / TIME: 202502071104
Unit Type and Rh: 5100
Unit Type and Rh: 5100
Unit Type and Rh: 5100
Unit Type and Rh: 5100
Unit Type and Rh: 5100
Unit Type and Rh: 5100

## 2023-04-29 LAB — BASIC METABOLIC PANEL
Anion gap: 13 (ref 5–15)
BUN: 28 mg/dL — ABNORMAL HIGH (ref 6–20)
CO2: 24 mmol/L (ref 22–32)
Calcium: 8.7 mg/dL — ABNORMAL LOW (ref 8.9–10.3)
Chloride: 94 mmol/L — ABNORMAL LOW (ref 98–111)
Creatinine, Ser: 1.16 mg/dL (ref 0.61–1.24)
GFR, Estimated: >60 mL/min (ref >60)
Glucose, Bld: 126 mg/dL — ABNORMAL HIGH (ref 70–99)
Potassium: 4 mmol/L (ref 3.5–5.1)
Sodium: 131 mmol/L — ABNORMAL LOW (ref 135–145)

## 2023-04-29 LAB — PROTIME-INR
INR: 1.1 (ref 0.8–1.2)
Prothrombin Time: 14.6 s (ref 11.4–15.2)

## 2023-04-29 LAB — GLUCOSE, CAPILLARY: Glucose-Capillary: 106 mg/dL — ABNORMAL HIGH (ref 70–99)

## 2023-04-29 MED ORDER — SODIUM CHLORIDE 0.9% FLUSH
3.0000 mL | INTRAVENOUS | Status: DC | PRN
  Administered 2023-04-30: 3 mL via INTRAVENOUS

## 2023-04-29 MED ORDER — SODIUM CHLORIDE 0.9% FLUSH
3.0000 mL | Freq: Two times a day (BID) | INTRAVENOUS | Status: DC
  Administered 2023-04-29 – 2023-05-04 (×7): 3 mL via INTRAVENOUS

## 2023-04-29 MED ORDER — ASPIRIN 81 MG PO TBEC
81.0000 mg | DELAYED_RELEASE_TABLET | Freq: Every day | ORAL | Status: DC
  Administered 2023-04-30 – 2023-05-04 (×5): 81 mg via ORAL
  Filled 2023-04-29 (×5): qty 1

## 2023-04-29 MED ORDER — ~~LOC~~ CARDIAC SURGERY, PATIENT & FAMILY EDUCATION: Freq: Once | Status: AC

## 2023-04-29 MED ORDER — SODIUM CHLORIDE 0.9 % IV SOLN: 250.0000 mL | INTRAVENOUS | Status: AC | PRN

## 2023-04-29 MED ORDER — WARFARIN SODIUM 2 MG PO TABS
4.0000 mg | ORAL_TABLET | Freq: Once | ORAL | Status: AC
  Administered 2023-04-29: 4 mg via ORAL
  Filled 2023-04-29: qty 2

## 2023-04-29 NOTE — Plan of Care (Signed)
  Problem: Clinical Measurements: Goal: Ability to maintain clinical measurements within normal limits will improve Outcome: Progressing Goal: Will remain free from infection Outcome: Progressing Goal: Diagnostic test results will improve Outcome: Progressing Goal: Respiratory complications will improve Outcome: Progressing Goal: Cardiovascular complication will be avoided Outcome: Progressing   Problem: Activity: Goal: Risk for activity intolerance will decrease Outcome: Progressing   Problem: Nutrition: Goal: Adequate nutrition will be maintained Outcome: Progressing   Problem: Coping: Goal: Level of anxiety will decrease Outcome: Progressing   Problem: Elimination: Goal: Will not experience complications related to bowel motility Outcome: Progressing Goal: Will not experience complications related to urinary retention Outcome: Progressing   Problem: Pain Managment: Goal: General experience of comfort will improve and/or be controlled Outcome: Progressing   Problem: Safety: Goal: Ability to remain free from injury will improve Outcome: Progressing   Problem: Education: Goal: Will demonstrate proper wound care and an understanding of methods to prevent future damage Outcome: Progressing Goal: Knowledge of disease or condition will improve Outcome: Progressing Goal: Knowledge of the prescribed therapeutic regimen will improve Outcome: Progressing Goal: Individualized Educational Video(s) Outcome: Progressing

## 2023-04-29 NOTE — Progress Notes (Addendum)
 Patient ID: Chase Lee, male   DOB: 12/09/1980, 43 y.o.   MRN: [id number]  TCTS Evening Rounds:  Hemodynamically stable.  Had BM today and feels better.  Ambulated.  Waiting on 4E bed.

## 2023-04-29 NOTE — Progress Notes (Signed)
 3 Days Post-Op Procedure(s) (LRB): REDO STERNOTOMY (N/A) BENTALL PROCEDURE USING ON-X ASCENDING AORTIC PROSTHESIS SIZE (N/A) REPLACEMENT ASCENDING AORTIC ANEURYSM USING HEMASHIELD PLATINUM GRAFT SIZE (N/A) TRANSESOPHAGEAL ECHOCARDIOGRAM (TEE) (N/A) Subjective:  Feeling better. Ambulated well. Pain under control.  Objective: Vital signs in last 24 hours: Temp:  [97.6 F (36.4 C)-99 F (37.2 C)] 97.6 F (36.4 C) (02/09 0300) Pulse Rate:  [64-94] 72 (02/09 0900) Cardiac Rhythm: Normal sinus rhythm (02/09 0745) Resp:  [11-32] 18 (02/09 0900) BP: (93-138)/(51-82) 110/62 (02/09 0900) SpO2:  [83 %-100 %] 94 % (02/09 0900) Weight:  [100.7 kg] 100.7 kg (02/09 0615)  Hemodynamic parameters for last 24 hours:    Intake/Output from previous day: 02/08 0701 - 02/09 0700 In: 900 [P.O.:900] Out: 1425 [Urine:1425] Intake/Output this shift: No intake/output data recorded.  General appearance: alert and cooperative Neurologic: intact Heart: regular rate and rhythm, crisp mechanical valve click Lungs: clear to auscultation bilaterally Extremities: no edema Wound: incisions healing well  Lab Results: Recent Labs    04/28/23 0345 04/29/23 0820  WBC 19.7* 17.7*  HGB 11.2* 10.4*  HCT 33.5* 30.8*  PLT 132* 127*   BMET:  Recent Labs    04/28/23 0345 04/29/23 0820  NA 133* 131*  K 4.5 4.0  CL 94* 94*  CO2 25 24  GLUCOSE 166* 126*  BUN 18 28*  CREATININE 0.86 1.16  CALCIUM  8.7* 8.7*    PT/INR:  Recent Labs    04/29/23 0820  LABPROT 14.6  INR 1.1   ABG    Component Value Date/Time   PHART 7.343 (L) 04/27/2023 0206   HCO3 21.3 04/27/2023 0206   TCO2 22 04/27/2023 0206   ACIDBASEDEF 4.0 (H) 04/27/2023 0206   O2SAT 95 04/27/2023 0206   CBG (last 3)  Recent Labs    04/28/23 1637 04/28/23 2221 04/29/23 0611  GLUCAP 122* 129* 106*    Assessment/Plan: S/P Procedure(s) (LRB): REDO STERNOTOMY (N/A) BENTALL PROCEDURE USING ON-X ASCENDING AORTIC  PROSTHESIS SIZE (N/A) REPLACEMENT ASCENDING AORTIC ANEURYSM USING HEMASHIELD PLATINUM GRAFT SIZE (N/A) TRANSESOPHAGEAL ECHOCARDIOGRAM (TEE) (N/A)  POD 3 Hemodynamically stable in sinus rhythm. Continue Lopressor  and Lisinopril .  DC pacing wires.  Wt is recorded back to baseline but I doubt that he lost 13.5 lbs yesterday. Will continue lasix  for now.  Coumadin  4 mg today.  Transfer to 4E and continue mobilization.   LOS: 9 days    Chase Lee 04/29/2023

## 2023-04-29 NOTE — Plan of Care (Signed)

## 2023-04-30 LAB — BASIC METABOLIC PANEL
Anion gap: 15 (ref 5–15)
BUN: 25 mg/dL — ABNORMAL HIGH (ref 6–20)
CO2: 25 mmol/L (ref 22–32)
Calcium: 8.8 mg/dL — ABNORMAL LOW (ref 8.9–10.3)
Chloride: 95 mmol/L — ABNORMAL LOW (ref 98–111)
Creatinine, Ser: 1.14 mg/dL (ref 0.61–1.24)
GFR, Estimated: >60 mL/min (ref >60)
Glucose, Bld: 121 mg/dL — ABNORMAL HIGH (ref 70–99)
Potassium: 3.7 mmol/L (ref 3.5–5.1)
Sodium: 135 mmol/L (ref 135–145)

## 2023-04-30 LAB — PROTIME-INR
INR: 1.3 — ABNORMAL HIGH (ref 0.8–1.2)
Prothrombin Time: 16.4 s — ABNORMAL HIGH (ref 11.4–15.2)

## 2023-04-30 LAB — CBC
HCT: 27.1 % — ABNORMAL LOW (ref 39.0–52.0)
Hemoglobin: 9.2 g/dL — ABNORMAL LOW (ref 13.0–17.0)
MCH: 30.2 pg (ref 26.0–34.0)
MCHC: 33.9 g/dL (ref 30.0–36.0)
MCV: 88.9 fL (ref 80.0–100.0)
Platelets: 182 10*3/uL (ref 150–400)
RBC: 3.05 MIL/uL — ABNORMAL LOW (ref 4.22–5.81)
RDW: 14.3 % (ref 11.5–15.5)
WBC: 13.3 10*3/uL — ABNORMAL HIGH (ref 4.0–10.5)
nRBC: 0 % (ref 0.0–0.2)

## 2023-04-30 LAB — SURGICAL PATHOLOGY

## 2023-04-30 MED ORDER — POTASSIUM CHLORIDE CRYS ER 20 MEQ PO TBCR
20.0000 meq | EXTENDED_RELEASE_TABLET | ORAL | Status: AC
  Administered 2023-04-30 (×3): 20 meq via ORAL
  Filled 2023-04-30 (×3): qty 1

## 2023-04-30 MED ORDER — CHLORHEXIDINE GLUCONATE CLOTH 2 % EX PADS
6.0000 | MEDICATED_PAD | Freq: Every day | CUTANEOUS | Status: DC
  Administered 2023-04-30 – 2023-05-04 (×4): 6 via TOPICAL

## 2023-04-30 MED ORDER — ALPRAZOLAM 0.5 MG PO TABS: 1.0000 mg | ORAL_TABLET | Freq: Three times a day (TID) | ORAL | Status: DC

## 2023-04-30 MED ORDER — ALUM & MAG HYDROXIDE-SIMETH 200-200-20 MG/5ML PO SUSP
15.0000 mL | Freq: Four times a day (QID) | ORAL | Status: DC | PRN
  Administered 2023-05-03 – 2023-05-04 (×2): 15 mL via ORAL
  Filled 2023-04-30 (×2): qty 30

## 2023-04-30 MED ORDER — ORAL CARE MOUTH RINSE: 15.0000 mL | OROMUCOSAL | Status: DC | PRN

## 2023-04-30 MED ORDER — CALCIUM CARBONATE ANTACID 500 MG PO CHEW: 2.0000 | CHEWABLE_TABLET | Freq: Two times a day (BID) | ORAL | Status: DC | PRN

## 2023-04-30 MED ORDER — ALPRAZOLAM 0.5 MG PO TABS
1.0000 mg | ORAL_TABLET | Freq: Three times a day (TID) | ORAL | Status: DC
  Administered 2023-04-30 – 2023-05-04 (×12): 1 mg via ORAL
  Filled 2023-04-30 (×13): qty 2

## 2023-04-30 MED ORDER — WARFARIN SODIUM 2 MG PO TABS
4.0000 mg | ORAL_TABLET | Freq: Every day | ORAL | Status: AC
  Administered 2023-04-30: 4 mg via ORAL
  Filled 2023-04-30: qty 2

## 2023-04-30 MED ORDER — CALCIUM CARBONATE ANTACID 500 MG PO CHEW
1.0000 | CHEWABLE_TABLET | Freq: Three times a day (TID) | ORAL | Status: DC
  Administered 2023-04-30 – 2023-05-04 (×13): 200 mg via ORAL
  Filled 2023-04-30 (×13): qty 1

## 2023-04-30 MED ORDER — OXYCODONE HCL 5 MG PO TABS
10.0000 mg | ORAL_TABLET | ORAL | Status: DC | PRN
  Administered 2023-04-30 – 2023-05-04 (×22): 10 mg via ORAL
  Filled 2023-04-30 (×22): qty 2

## 2023-04-30 MED FILL — Electrolyte-R (PH 7.4) Solution: INTRAVENOUS | Qty: 7000 | Status: CN

## 2023-04-30 MED FILL — Lidocaine HCl Local Preservative Free (PF) Inj 2%: INTRAMUSCULAR | Qty: 14 | Status: CN

## 2023-04-30 MED FILL — Calcium Chloride Inj 10%: INTRAVENOUS | Qty: 10 | Status: CN

## 2023-04-30 MED FILL — Mannitol IV Soln 20%: INTRAVENOUS | Qty: 500 | Status: CN

## 2023-04-30 MED FILL — Heparin Sodium (Porcine) Inj 1000 Unit/ML: INTRAMUSCULAR | Qty: 20 | Status: CN

## 2023-04-30 MED FILL — Sodium Chloride IV Soln 0.9%: INTRAVENOUS | Qty: 2000 | Status: CN

## 2023-04-30 MED FILL — Sodium Bicarbonate IV Soln 8.4%: INTRAVENOUS | Qty: 50 | Status: CN

## 2023-04-30 NOTE — Progress Notes (Signed)
 Pt resting, would like to walk later with his wife. Reminders given for IS, sternal precautions, ambulation, sitting in recliner. Will f/u tomorrow.  German Koller BS, ACSM-CEP 04/30/2023 3:18 PM [phone]

## 2023-04-30 NOTE — Progress Notes (Signed)
 4 Days Post-Op Procedure(s) (LRB): REDO STERNOTOMY (N/A) BENTALL PROCEDURE USING ON-X ASCENDING AORTIC PROSTHESIS SIZE (N/A) REPLACEMENT ASCENDING AORTIC ANEURYSM USING HEMASHIELD PLATINUM GRAFT SIZE (N/A) TRANSESOPHAGEAL ECHOCARDIOGRAM (TEE) (N/A) Subjective: Had heartburn overnight but ok this am. Take omeprazole  at home. He is on protonix  here.  Objective: Vital signs in last 24 hours: Temp:  [98.7 F (37.1 C)-98.9 F (37.2 C)] 98.7 F (37.1 C) (02/10 0300) Pulse Rate:  [65-196] 82 (02/10 0600) Cardiac Rhythm: Normal sinus rhythm;Bundle branch block (02/10 0300) Resp:  [13-28] 22 (02/10 0600) BP: (81-133)/(50-90) 103/62 (02/10 0600) SpO2:  [66 %-100 %] 95 % (02/10 0600) Weight:  [98.2 kg] 98.2 kg (02/10 0327)  Hemodynamic parameters for last 24 hours:    Intake/Output from previous day: 02/09 0701 - 02/10 0700 In: 480 [P.O.:480] Out: 2850 [Urine:2850] Intake/Output this shift: Total I/O In: -  Out: 1300 [Urine:1300]  General appearance: alert and cooperative Neurologic: intact Heart: regular rate and rhythm, crisp mechanical valve click Lungs: clear to auscultation bilaterally Extremities: no edema Wound: incisions healing well  Lab Results: Recent Labs    04/29/23 0820 04/30/23 0352  WBC 17.7* 13.3*  HGB 10.4* 9.2*  HCT 30.8* 27.1*  PLT 127* 182   BMET:  Recent Labs    04/29/23 0820 04/30/23 0352  NA 131* 135  K 4.0 3.7  CL 94* 95*  CO2 24 25  GLUCOSE 126* 121*  BUN 28* 25*  CREATININE 1.16 1.14  CALCIUM  8.7* 8.8*    PT/INR:  Recent Labs    04/30/23 0352  LABPROT 16.4*  INR 1.3*   ABG    Component Value Date/Time   PHART 7.343 (L) 04/27/2023 0206   HCO3 21.3 04/27/2023 0206   TCO2 22 04/27/2023 0206   ACIDBASEDEF 4.0 (H) 04/27/2023 0206   O2SAT 95 04/27/2023 0206   CBG (last 3)  Recent Labs    04/28/23 1637 04/28/23 2221 04/29/23 0611  GLUCAP 122* 129* 106*    Assessment/Plan: S/P Procedure(s) (LRB): REDO  STERNOTOMY (N/A) BENTALL PROCEDURE USING ON-X ASCENDING AORTIC PROSTHESIS SIZE (N/A) REPLACEMENT ASCENDING AORTIC ANEURYSM USING HEMASHIELD PLATINUM GRAFT SIZE (N/A) TRANSESOPHAGEAL ECHOCARDIOGRAM (TEE) (N/A)  POD 4  Hemodynamically stable in sinus rhythm. Continue Lopressor  and lisinopril .  Wt is below preop. No further diuresis needed.  INR 1.3. continue Coumadin  4 mg daily which was previous dose.  DC Toradol  since INR starting to rise. Use Oxy IR for pain. He has had problems with Tramadol  before.  Add Tumms for heartburn as needed.  Continue IS, ambulation.  Waiting on 4E bed.   LOS: 10 days    Bartley Lightning 04/30/2023

## 2023-05-01 ENCOUNTER — Other Ambulatory Visit: Payer: Self-pay | Admitting: Home Health

## 2023-05-01 DIAGNOSIS — Z952 Presence of prosthetic heart valve: Secondary | ICD-10-CM

## 2023-05-01 DIAGNOSIS — I77810 Thoracic aortic ectasia: Secondary | ICD-10-CM | POA: Diagnosis not present

## 2023-05-01 LAB — PROTIME-INR
INR: 1.3 — ABNORMAL HIGH (ref 0.8–1.2)
Prothrombin Time: 16.9 s — ABNORMAL HIGH (ref 11.4–15.2)

## 2023-05-01 MED ORDER — AMIODARONE LOAD VIA INFUSION
150.0000 mg | Freq: Once | INTRAVENOUS | Status: AC
  Administered 2023-05-01: 150 mg via INTRAVENOUS
  Filled 2023-05-01: qty 83.34

## 2023-05-01 MED ORDER — AMIODARONE HCL IN DEXTROSE 360-4.14 MG/200ML-% IV SOLN
30.0000 mg/h | INTRAVENOUS | Status: DC
  Administered 2023-05-02: 30 mg/h via INTRAVENOUS
  Filled 2023-05-01: qty 200

## 2023-05-01 MED ORDER — AMIODARONE HCL IN DEXTROSE 360-4.14 MG/200ML-% IV SOLN
60.0000 mg/h | INTRAVENOUS | Status: DC
  Administered 2023-05-01 (×2): 60 mg/h via INTRAVENOUS
  Filled 2023-05-01 (×2): qty 200

## 2023-05-01 MED ORDER — WARFARIN SODIUM 5 MG PO TABS
5.0000 mg | ORAL_TABLET | Freq: Every day | ORAL | Status: AC
  Administered 2023-05-01: 5 mg via ORAL
  Filled 2023-05-01: qty 1

## 2023-05-01 MED FILL — Mannitol IV Soln 20%: INTRAVENOUS | Qty: 500 | Status: AC

## 2023-05-01 MED FILL — Sodium Chloride IV Soln 0.9%: INTRAVENOUS | Qty: 2000 | Status: AC

## 2023-05-01 MED FILL — Calcium Chloride Inj 10%: INTRAVENOUS | Qty: 10 | Status: AC

## 2023-05-01 MED FILL — Electrolyte-R (PH 7.4) Solution: INTRAVENOUS | Qty: 7000 | Status: AC

## 2023-05-01 MED FILL — Lidocaine HCl Local Preservative Free (PF) Inj 2%: INTRAMUSCULAR | Qty: 14 | Status: AC

## 2023-05-01 MED FILL — Heparin Sodium (Porcine) Inj 1000 Unit/ML: INTRAMUSCULAR | Qty: 20 | Status: AC

## 2023-05-01 MED FILL — Sodium Bicarbonate IV Soln 8.4%: INTRAVENOUS | Qty: 50 | Status: AC

## 2023-05-01 NOTE — Progress Notes (Signed)
   05/01/23 1955  Assess: MEWS Score  Temp 98.3 F (36.8 C)  BP 124/75  MAP (mmHg) 85  Pulse Rate 94  ECG Heart Rate (!) 123  Resp 20  Level of Consciousness Alert  SpO2 96 %  O2 Device Room Air  Assess: MEWS Score  MEWS Temp 0  MEWS Systolic 0  MEWS Pulse 2  MEWS RR 0  MEWS LOC 0  MEWS Score 2  MEWS Score Color Yellow  Assess: SIRS CRITERIA  SIRS Temperature  0  SIRS Respirations  0  SIRS Pulse 1  SIRS WBC 0  SIRS Score Sum  1   Amiodarone bolus now complete. Vital signs stable. Heart rate in 120's. Pt anxious but resting in bed with wife at bedside. Amiodarone IV infusing.

## 2023-05-01 NOTE — Progress Notes (Signed)
   05/01/23 1916  Assess: MEWS Score  BP (!) 147/82  MAP (mmHg) 93  Pulse Rate (!) 133  ECG Heart Rate (!) 145  Resp 20  Level of Consciousness Alert  SpO2 100 %  Assess: MEWS Score  MEWS Temp 0  MEWS Systolic 0  MEWS Pulse 3  MEWS RR 0  MEWS LOC 0  MEWS Score 3  MEWS Score Color Yellow  Assess: if the MEWS score is Yellow or Red  Were vital signs accurate and taken at a resting state? Yes  MEWS guidelines implemented  Yes, yellow  Treat  MEWS Interventions Considered administering scheduled or prn medications/treatments as ordered  Take Vital Signs  Increase Vital Sign Frequency  Yellow: Q2hr x1, continue Q4hrs until patient remains Rossetti for 12hrs  Escalate  MEWS: Escalate Yellow: Discuss with charge nurse and consider notifying provider and/or RRT  Notify: Charge Nurse/RN  Name of Charge Nurse/RN Notified Brian/Daliana Leverett  Provider Notification  Provider Name/Title Weldner  Date Provider Notified 05/01/23  Time Provider Notified 1915  Method of Notification Call  Notification Reason Change in status  Provider response See new orders  Date of Provider Response 05/01/23  Time of Provider Response 1915  Assess: SIRS CRITERIA  SIRS Temperature  0  SIRS Respirations  0  SIRS Pulse 1  SIRS WBC 0  SIRS Score Sum  1   Patient heart rate noted to be 150's on monitor. Stat EKG shows Afib with RVR. Dr. Leafy Ro paged and received orders for Amiodarone protocol with 150 mg bolus. Educated patient and wife on plan of care and reason for new medication. Yellow MEWS protocol started.

## 2023-05-01 NOTE — Discharge Instructions (Signed)
Discharge Instructions:  1. You may shower, please wash incisions daily with soap and water and keep dry.  If you wish to cover wounds with dressing you may do so but please keep clean and change daily.  No tub baths or swimming until incisions have completely healed.  If your incisions become red or develop any drainage please call our office at [phone]  2. No Driving until cleared by Dr. Sharee Pimple office and you are no longer using narcotic pain medications  3. Monitor your weight daily.. Please use the same scale and weigh at same time... If you gain 5-10 lbs in 48 hours with associated lower extremity swelling, please contact our office at [phone]  4. Fever of 101.5 for at least 24 hours with no source, please contact our office at [phone]  5. Activity- up as tolerated, please walk at least 3 times per day.  Avoid strenuous activity, no lifting, pushing, or pulling with your arms over 8-10 lbs for a minimum of 6 weeks  6. If any questions or concerns arise, please do not hesitate to contact our office at [phone]

## 2023-05-01 NOTE — Plan of Care (Signed)
Problem: Education: Goal: Knowledge of General Education information will improve Description: Including pain rating scale, medication(s)/side effects and non-pharmacologic comfort measures Outcome: Progressing   Problem: Health Behavior/Discharge Planning: Goal: Ability to manage health-related needs will improve Outcome: Progressing   Problem: Clinical Measurements: Goal: Ability to maintain clinical measurements within normal limits will improve Outcome: Progressing Goal: Will remain free from infection Outcome: Progressing Goal: Diagnostic test results will improve Outcome: Progressing Goal: Respiratory complications will improve Outcome: Progressing Goal: Cardiovascular complication will be avoided Outcome: Progressing   Problem: Activity: Goal: Risk for activity intolerance will decrease Outcome: Progressing   Problem: Nutrition: Goal: Adequate nutrition will be maintained Outcome: Progressing   Problem: Coping: Goal: Level of anxiety will decrease Outcome: Progressing   Problem: Elimination: Goal: Will not experience complications related to bowel motility Outcome: Progressing Goal: Will not experience complications related to urinary retention Outcome: Progressing   Problem: Pain Managment: Goal: General experience of comfort will improve and/or be controlled Outcome: Progressing   Problem: Safety: Goal: Ability to remain free from injury will improve Outcome: Progressing   Problem: Skin Integrity: Goal: Risk for impaired skin integrity will decrease Outcome: Progressing   Problem: Education: Goal: Will demonstrate proper wound care and an understanding of methods to prevent future damage Outcome: Progressing Goal: Knowledge of disease or condition will improve Outcome: Progressing Goal: Knowledge of the prescribed therapeutic regimen will improve Outcome: Progressing Goal: Individualized Educational Video(s) Outcome: Progressing   Problem:  Activity: Goal: Risk for activity intolerance will decrease Outcome: Progressing   Problem: Cardiac: Goal: Will achieve and/or maintain hemodynamic stability Outcome: Progressing   Problem: Clinical Measurements: Goal: Postoperative complications will be avoided or minimized Outcome: Progressing   Problem: Respiratory: Goal: Respiratory status will improve Outcome: Progressing   Problem: Skin Integrity: Goal: Wound healing without signs and symptoms of infection Outcome: Progressing Goal: Risk for impaired skin integrity will decrease Outcome: Progressing   Problem: Urinary Elimination: Goal: Ability to achieve and maintain adequate renal perfusion and functioning will improve Outcome: Progressing   Problem: Education: Goal: Will demonstrate proper wound care and an understanding of methods to prevent future damage Outcome: Progressing Goal: Knowledge of disease or condition will improve Outcome: Progressing Goal: Knowledge of the prescribed therapeutic regimen will improve Outcome: Progressing Goal: Individualized Educational Video(s) Outcome: Progressing   Problem: Activity: Goal: Risk for activity intolerance will decrease Outcome: Progressing   Problem: Cardiac: Goal: Will achieve and/or maintain hemodynamic stability Outcome: Progressing   Problem: Clinical Measurements: Goal: Postoperative complications will be avoided or minimized Outcome: Progressing   Problem: Respiratory: Goal: Respiratory status will improve Outcome: Progressing   Problem: Skin Integrity: Goal: Wound healing without signs and symptoms of infection Outcome: Progressing Goal: Risk for impaired skin integrity will decrease Outcome: Progressing   Problem: Urinary Elimination: Goal: Ability to achieve and maintain adequate renal perfusion and functioning will improve Outcome: Progressing

## 2023-05-01 NOTE — Discharge Summary (Signed)
Physician Discharge Summary  Patient ID: Chase Lee MRN: [id number] DOB/AGE: 45/21/1982 45 y.o.  Admit date: 04/20/2023 Discharge date: 05/04/2023  Admission Diagnoses:  Ascending aortic aneurysm [facility]) Bicuspid aortic valve Hypertension Anxiety  Discharge Diagnoses:   Ascending aortic aneurysm (HCC) Bicuspid aortic valve Hypertension Anxiety Expected acute blood loss anemia Post-operative atrial fibrillation    Discharged Condition: good  Referring Provider is Waldon Reining, MD  in ER. Primary Cardiologist is Dr. Jenene Slicker  HPI: The patient is a 43 year old gentleman with history of anxiety, hypertension and bicuspid aortic valve disease who underwent an aortic valvuloplasty at age 57 through a sternotomy incision.  He subsequently underwent aortic valve replacement at age 32 with a mechanical valve.  He has been maintained on Coumadin since.  These were both done in [place] and the details are unavailable.  He was initially seen by Dr. Brett Canales for cardiology consultation in May 2024.  At that time he was having some shortness of breath as well as bilateral lower extremity edema and some palpitations.  He was started on Lopressor 25 mg twice daily.  An echocardiogram was ordered at that time but not completed.  He was seen back by the nurse practitioner on 03/05/2023 and was felt to be doing well overall.  An echo was ordered again and was done on 04/17/2023 showing severe dilation of the ascending aorta at 7.7 cm with the root measuring 6.2 cm.  The mechanical aortic valve was functioning appropriately with a mean gradient of 13.3 mmHg.  Left ventricular systolic function was normal.  He had a CT scan of the neck on 04/04/2023 due to some headache and blurry vision.  This showed the distal part of the ascending aorta which was measured at 5.7 cm.  No other abnormality was seen.  He was seen back again by Dr. Jenene Slicker on 04/05/2023 and was feeling fairly well overall with a  blood pressure at that time to 116/68.  An appointment was scheduled for outpatient cardiothoracic surgery consultation.  He was seen in the emergency room yesterday complaining of chest pain and headache and had a CTA of the chest showing a 7.4 cm ascending aortic aneurysm.  The ER physician was concerned because the previous CT scan of the neck was read as a 5.7 cm aneurysm but this was only showing the distal ascending aorta accounting for the discrepancy.  His BP was 148/86 and this was treated with clonidine and hydralazine.  [facility] office was called to get him an appointment immediately which we did on 04/20/2023. He was a direct admit to [facility]. Pre operative work up was undertaken and BP was controlled with IV medications. He was on a Heparin drip (has St. Jude mechanical aortic valve). Dr. Laneta Simmers reviewed cardiac CT.discussed the need for redo sternotomy, repair of ascending thoracic aortic aneurysm, hypothermic circulatory arrest via right axillary cannulation and long venous cannulation. Potential risks, benefits, and complications of the surgery were discussed with the patient and he agreed to proceed with surgery. Pre operative carotid duplex US showed no significant internal carotid artery stenosis bilaterally.  Hospital Course: Patient underwent transesophageal echocardiogram(TEE), redo sternotomy, replacement of ascending aortic aneurysm and root under deep hypothermic circulatory arrest,, Bentall (USING ONXAAP-25, Serial # [id number], Size 25 mm). He was transported from the OR to [facility] ICU in stable condition.  He remained hemodynamically stable.  He did not require any inotropic support.  He was weaned from the ventilator and extubated at around 10:30 PM on day of surgery.  On the first postoperative day, he was maintaining sinus rhythm but with a heart rate in the 60s.  Beta blocking agents were withheld due to relative bradycardia.  The monitoring lines were removed routinely on postop day 1.  Lopressor was titrated as able and Lisinopril was restarted for better BP control on post op day 2. He was transitioned off the Insulin drip. His pre op HGA1C was 6.5. He was started on Coumadin and daily PT/INT were obtained. Pain control was an issue so Toradol was added. He was stable for transfer from the ICU to 4E for further convalescence on 02/09.  He had a few more episodes of atrial fibrillation that converted back to sinus rhythm without any intervention. He regained independence with mobility. His Coumadin dosing was gradually increased to 5mg  daily. His INR was 1.8 at the time of discharge and will be re-checked at Dr. Antoine Poche office in [place] on Monday 05/07/23.   Consults: None  Significant Diagnostic Studies:   CLINICAL DATA:  Status post aortic valve replacement.   EXAM: PORTABLE CHEST 1 VIEW   COMPARISON:  04/27/2023   FINDINGS: Low volume film. Pulmonary artery catheter is been removed in the interval with right IJ sheath still in place. Bilateral chest tubes have been removed with interval retrieval of the mediastinal/pericardial drains. No pneumothorax or substantial pleural effusion. Streaky opacity at the right base is compatible with atelectasis with persistent similar retrocardiac atelectasis. Tiny left pleural effusion not excluded. Telemetry leads overlie the chest.   IMPRESSION: 1. Interval removal of pulmonary artery catheter, bilateral chest tubes, and mediastinal/pericardial drains. No pneumothorax. 2. Persistent bibasilar atelectasis.     Electronically Signed   By: Kennith Center M.D.   On: 04/28/2023 09:29  Treatments:   CARDIOVASCULAR SURGERY OPERATIVE NOTE   04/26/2023   Surgeon:  Alleen Borne, MD   First Assistant: Doree Fudge,  PA-C: An experienced assistant was required given the complexity of this surgery and the standard of surgical care. The assistant was needed for exposure, dissection, suctioning, retraction of delicate  tissues and sutures, instrument exchange and for overall help during this procedure.      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 Jude mechanical AVR at age 13 for bicuspid aortic valve disease.     Postoperative Diagnosis:  Same     Procedure:   Redo Median Sternotomy Extracorporeal circulation via right axillary artery and right femoral vein Right axillary cannulation with 8 mm Hemashield graft 4.   Replacement of the ascending aorta (hemi-arch) using a 32 mm Hemashield graft under deep hypothermic circulatory arrest 5.   Removal of old mechanical valve  6.   Bentall Procedure using a 25 mm On-X mechanical valved conduit, reimplantation of both right and left coronary arteries.   Anesthesia:  General Endotracheal      Discharge Exam: Blood pressure 132/87, pulse 91, temperature 98.4 F (36.9 C), temperature source Oral, resp. rate 17, height 5\' 6"  (1.676 m), weight 96.2 kg, SpO2 94%.  General appearance: alert, cooperative, and no distress Neurologic: intact Heart: remains in SR with  occasional sinus tach  Lungs: normal work of breathing and clear breath sounds. Stable sats on RA Abdomen: soft, no tenderness Extremities: well perfused with no peripheral edema Wound: the sternotomy and right subclavian incisions are intact and dry.  Disposition:   Discharge Instructions     Amb Referral to Cardiac Rehabilitation   Complete by: As directed    Diagnosis: Valve  Replacement   Valve: Aortic   After initial evaluation and assessments completed: Virtual Based Care may be provided alone or in conjunction with Phase 2 Cardiac Rehab based on patient barriers.: Yes   Intensive Cardiac Rehabilitation (ICR) [facility] location only OR Traditional Cardiac Rehabilitation (TCR) *If criteria for ICR are not met will enroll in TCR [facility] only): Yes      Allergies as of 05/04/2023   No Known Allergies      Medication List     STOP taking these  medications    Buprenorphine HCl-Naloxone HCl 4-1 MG Film   furosemide 40 MG tablet Commonly known as: LASIX   promethazine-dextromethorphan 6.25-15 MG/5ML syrup Commonly known as: PROMETHAZINE-DM       TAKE these medications    ALPRAZolam 1 MG tablet Commonly known as: XANAX TAKE 1 TABLET(1 MG) BY MOUTH THREE TIMES DAILY AS NEEDED FOR ANXIETY   amiodarone 200 MG tablet Commonly known as: PACERONE Take 2 tablets (400 mg total) by mouth 2 (two) times daily. For 5 days then decrease the dose to 1 tablet (200mg ) TWICE daily for 14 days then decrease the dose to 1 tablet (200mg ) ONCE daily   aspirin EC 81 MG tablet Take 1 tablet (81 mg total) by mouth daily. Swallow whole.   Compression Bandages Kit 1 each by Does not apply route as needed (swelling). Adult compression stocking 15-20 adult size medium-large   escitalopram 10 MG tablet Commonly known as: LEXAPRO TAKE 1 TABLET(10 MG) BY MOUTH DAILY   lisinopril 20 MG tablet Commonly known as: ZESTRIL Take 1 tablet (20 mg total) by mouth daily.   metoprolol tartrate 25 MG tablet Commonly known as: LOPRESSOR Take 1 tablet (25 mg total) by mouth 2 (two) times daily.   omeprazole 40 MG capsule Commonly known as: PRILOSEC Take 1 capsule (40 mg total) by mouth daily.   oxyCODONE 5 MG immediate release tablet Commonly known as: Oxy IR/ROXICODONE Take 1 tablet (5 mg total) by mouth every 4 (four) hours as needed for up to 7 days for severe pain (pain score 7-10).   warfarin 5 MG tablet Commonly known as: COUMADIN Take as directed. If you are unsure how to take this medication, talk to your nurse or doctor. Original instructions: Take 1 tablet (5 mg total) by mouth daily. What changed:  medication strength how much to take        Follow-up Information     [facility]. Go on 05/09/2023.   Specialty: Cardiothoracic Surgery Why: Your appointment for suture removal is 11am. Contact  information: [address] [place], [address] [place] [address] [phone]        Alleen Borne, MD. Go on 05/31/2023.   Specialty: Cardiothoracic Surgery Why: Your appointment is at 3:30pm. Pleae obtain a chest x-ray about 1 hour before the appointment at [facility] located at [address] [facility]. Contact information: [address] [employer] [address] [place] [address] [phone]         [facility] Call.   Why: for an appointment regarding pre op HGA1C 6.5 Contact information: [address] [place] [address] [phone]         [facility] [place]. Go on 05/07/2023.   Specialty: Cardiology Why: Your appointment for a PT / INR is at 11:30am. Contact information: [address] [place] [address] [phone]  The patient has been discharged on:   1.Beta Blocker:  Yes [  x  ]                              If No, reason:  2.Ace Inhibitor/ARB: Yes [ x]                                     No  [    ]                                     If No, reason:  3.Statin:   Yes [   ]                  No  [ x ]                  If No, reason: No indication  4.Ecasa:  Yes  [ x ]                  No   [   ]                  If No, reason:  5. ACS on Admission?  No  P2Y12 Inhibitor:  Yes  [   ]                                No  [x  ]    Signed: Leary Roca, PA-C 05/04/2023, 10:45 AM

## 2023-05-01 NOTE — Progress Notes (Signed)
PROGRESS NOTE    Chase Lee  UYQ:[id number] DOB: 1980-11-01 DOA: 04/20/2023 PCP: [facility]   Brief Narrative:  43 year old male with history of anxiety, chronic pain, hypertension and aortic valve replacement x 2 on Coumadin presented with chest pain, headache and dyspnea on exertion.  Workup revealed extremely large thoracic aneurysm as well as uncontrolled hypertension.  Cardiothoracic surgery was consulted.  He underwent surgical intervention by cardiothoracic surgery on 04/26/2023.  He was transferred back to [facility] service from 05/01/2023 onwards.  Assessment & Plan:   Symptomatic ascending and aortic root aneurysm -underwent surgical intervention by cardiothoracic surgery on 04/26/2023. -He was transferred back to [facility] service from 05/01/2023 onwards. -Cardiothoracic surgery following: Follow recommendations  Paroxysmal A-fib -Continue Coumadin dosed as per pharmacy.  Monitor INR.  Still subtherapeutic.  Hypertension Hypertensive urgency: Resolved -Blood pressure currently stable.  Continue lisinopril and metoprolol  Hyponatremia -Resolved.  No labs today.  Leukocytosis -Possibly reactive.  Improving.  Monitor  Anemia of chronic disease -From chronic illnesses.  Hemoglobin stable.  Monitor intermittently.  Thrombocytopenia -Resolved  Anxiety/depression -continue Xanax and escitalopram  GERD -Continue Protonix  Chronic pain with opiate dependence -Continue current pain management regimen.  Outpatient follow-up with PCP and/for pain management  Obesity class I -Outpatient follow-up  DVT prophylaxis: Coumadin Code Status: Full Family Communication: None at bedside Disposition Plan: Status is: Inpatient Remains inpatient appropriate because: Of severity of illness  Consultants: Cardiothoracic surgery/PCCM  Procedures: As above  Antimicrobials:  Anti-infectives (From admission, onward)    Start     Dose/Rate Route Frequency Ordered Stop    04/27/23 0030  vancomycin (VANCOCIN) IVPB 1000 mg/200 mL premix        1,000 mg 200 mL/hr over 60 Minutes Intravenous  Once 04/26/23 1803 04/27/23 0056   04/26/23 1830  ceFAZolin (ANCEF) IVPB 2g/100 mL premix        2 g 200 mL/hr over 30 Minutes Intravenous Every 8 hours 04/26/23 1803 04/28/23 1404   04/26/23 0400  Vancomycin (VANCOCIN) 1,500 mg in sodium chloride 0.9 % 500 mL IVPB        1,500 mg 250 mL/hr over 120 Minutes Intravenous To Surgery 04/25/23 1310 04/26/23 1835   04/26/23 0400  ceFAZolin (ANCEF) IVPB 2g/100 mL premix        2 g 200 mL/hr over 30 Minutes Intravenous To Surgery 04/25/23 1310 04/26/23 1616   04/26/23 0400  ceFAZolin (ANCEF) IVPB 2g/100 mL premix  Status:  Discontinued        2 g 200 mL/hr over 30 Minutes Intravenous To Surgery 04/25/23 1310 04/26/23 1803        Subjective: Patient seen and examined at bedside.  Complains of intermittent pain.  Patient: Comfort care no fever, vomiting, abdominal pain reported  Objective: Vitals:   05/01/23 0044 05/01/23 0345 05/01/23 0500 05/01/23 0731  BP: 100/63 122/64  135/68  Pulse: 73 67    Resp: 19 17    Temp: 98.3 F (36.8 C) 98.1 F (36.7 C)  97.9 F (36.6 C)  TempSrc: Oral Oral  Oral  SpO2: 98% 100%    Weight:   96.8 kg   Height:        Intake/Output Summary (Last 24 hours) at 05/01/2023 0811 Last data filed at 05/01/2023 0359 Gross per 24 hour  Intake 6 ml  Output 1375 ml  Net -1369 ml   Filed Weights   04/29/23 0615 04/30/23 0327 05/01/23 0500  Weight: 100.7 kg 98.2 kg 96.8 kg  Examination:  General exam: Appears calm and comfortable.  On room air. Respiratory system: Bilateral decreased breath sounds at bases Cardiovascular system: S1 & S2 heard, Rate controlled Gastrointestinal system: Abdomen is obese, nondistended, soft and nontender. Normal bowel sounds heard. Extremities: No cyanosis, clubbing; trace lower extremity edema Central nervous system: Alert and oriented. No focal  neurological deficits. Moving extremities Skin: No rashes, lesions or ulcers Psychiatry: Flat affect.  Not agitated.  Data Reviewed: I have personally reviewed following labs and imaging studies  CBC: Recent Labs  Lab 04/27/23 0434 04/27/23 1750 04/28/23 0345 04/29/23 0820 04/30/23 0352  WBC 13.6* 16.0* 19.7* 17.7* 13.3*  HGB 10.2* 10.9* 11.2* 10.4* 9.2*  HCT 30.8* 33.0* 33.5* 30.8* 27.1*  MCV 88.8 89.4 89.8 89.5 88.9  PLT 99* 123* 132* 127* 182   Basic Metabolic Panel: Recent Labs  Lab 04/25/23 0434 04/26/23 0431 04/26/23 0821 04/27/23 0028 04/27/23 0206 04/27/23 0434 04/27/23 1750 04/28/23 0345 04/29/23 0820 04/30/23 0352  NA 135 135   < > 133*   < > 133* 133* 133* 131* 135  K 4.0 4.4   < > 4.9   < > 5.1 4.4 4.5 4.0 3.7  CL 100 101   < > 103  --  104 97* 94* 94* 95*  CO2 26 25  --  22  --  23 26 25 24 25   GLUCOSE 115* 140*   < > 176*  --  113* 146* 166* 126* 121*  BUN 17 19   < > 19  --  18 18 18  28* 25*  CREATININE 1.06 1.16   < > 1.24  --  1.04 1.03 0.86 1.16 1.14  CALCIUM 9.5 8.9  --  7.5*  --  7.5* 8.2* 8.7* 8.7* 8.8*  MG 2.0 2.0  --  3.3*  --  2.9* 2.7*  --   --   --    < > = values in this interval not displayed.   GFR: Estimated Creatinine Clearance: 89.1 mL/min (by C-G formula based on SCr of 1.14 mg/dL). Liver Function Tests: No results for input(s): "AST", "ALT", "ALKPHOS", "BILITOT", "PROT", "ALBUMIN" in the last 168 hours. No results for input(s): "LIPASE", "AMYLASE" in the last 168 hours. No results for input(s): "AMMONIA" in the last 168 hours. Coagulation Profile: Recent Labs  Lab 04/25/23 0434 04/26/23 1827 04/29/23 0820 04/30/23 0352 05/01/23 0330  INR 1.0 1.3* 1.1 1.3* 1.3*   Cardiac Enzymes: No results for input(s): "CKTOTAL", "CKMB", "CKMBINDEX", "TROPONINI" in the last 168 hours. BNP (last 3 results) No results for input(s): "PROBNP" in the last 8760 hours. HbA1C: No results for input(s): "HGBA1C" in the last 72  hours. CBG: Recent Labs  Lab 04/28/23 0756 04/28/23 1145 04/28/23 1637 04/28/23 2221 04/29/23 0611  GLUCAP 168* 171* 122* 129* 106*   Lipid Profile: No results for input(s): "CHOL", "HDL", "LDLCALC", "TRIG", "CHOLHDL", "LDLDIRECT" in the last 72 hours. Thyroid Function Tests: No results for input(s): "TSH", "T4TOTAL", "FREET4", "T3FREE", "THYROIDAB" in the last 72 hours. Anemia Panel: No results for input(s): "VITAMINB12", "FOLATE", "FERRITIN", "TIBC", "IRON", "RETICCTPCT" in the last 72 hours. Sepsis Labs: No results for input(s): "PROCALCITON", "LATICACIDVEN" in the last 168 hours.  No results found for this or any previous visit (from the past 240 hours).       Radiology Studies: No results found.      Scheduled Meds:  acetaminophen  1,000 mg Oral Q6H   Or   acetaminophen (TYLENOL) oral liquid 160 mg/5 mL  1,000 mg  Per Tube Q6H   ALPRAZolam  1 mg Oral TID AC   aspirin EC  81 mg Oral Daily   calcium carbonate  1 tablet Oral TID WC   Chlorhexidine Gluconate Cloth  6 each Topical Daily   docusate sodium  200 mg Oral Daily   escitalopram  10 mg Oral QHS   Fe Fum-Vit C-Vit B12-FA  1 capsule Oral QPC breakfast   lisinopril  20 mg Oral Daily   metoprolol tartrate  25 mg Oral BID   pantoprazole  40 mg Oral Daily   sodium chloride flush  3 mL Intravenous Q12H   warfarin  5 mg Oral q1600   Warfarin - Physician Dosing Inpatient   Does not apply q1600   Continuous Infusions:        Glade Lloyd, MD [facility] 05/01/2023, 8:11 AM

## 2023-05-01 NOTE — Progress Notes (Signed)
Mobility Specialist Progress Note:    05/01/23 1153  Mobility  Activity Ambulated with assistance in hallway  Level of Assistance Standby assist, set-up cues, supervision of patient - no hands on  Assistive Device Front wheel walker  Distance Ambulated (ft) 250 ft  Activity Response Tolerated well  Mobility Referral Yes  Mobility visit 1 Mobility  Mobility Specialist Start Time (ACUTE ONLY) 1120  Mobility Specialist Stop Time (ACUTE ONLY) 1135  Mobility Specialist Time Calculation (min) (ACUTE ONLY) 15 min   Received pt in bed having no complaints and agreeable to mobility. C/o mild abdominal pain during ambulation, otherwise no c/o. Was able to practice sternal precautions w/o fault, MS specialist educated pt on further sternal precautions. Returned to room w/o fault. Left seated EOB w/ call bell in reach and all needs met.   Thompson Grayer Mobility Specialist  Please contact vis Secure Chat or  Rehab Office [phone]

## 2023-05-01 NOTE — Progress Notes (Addendum)
[address]       [place] [address]             [phone]      5 Days Post-Op Procedure(s) (LRB): REDO STERNOTOMY (N/A) BENTALL PROCEDURE USING ON-X ASCENDING AORTIC PROSTHESIS SIZE (N/A) REPLACEMENT ASCENDING AORTIC ANEURYSM USING HEMASHIELD PLATINUM GRAFT SIZE (N/A) TRANSESOPHAGEAL ECHOCARDIOGRAM (TEE) (N/A) Subjective: Transferred from ICU yesterday. Pain control is fair, he is also concerned about his ongoing anxiety and being able to get his Xanax refilled after discharge.  Declined to walk with the rehab team yesterday but was walking in the ICU prior to transfer.   BM yesterday   Objective: Vital signs in last 24 hours: Temp:  [98 F (36.7 C)-98.9 F (37.2 C)] 98.1 F (36.7 C) (02/11 0345) Pulse Rate:  [67-80] 67 (02/11 0345) Cardiac Rhythm: Normal sinus rhythm (02/10 2100) Resp:  [17-21] 17 (02/11 0345) BP: (100-149)/(59-83) 122/64 (02/11 0345) SpO2:  [98 %-100 %] 100 % (02/11 0345) Weight:  [96.8 kg] 96.8 kg (02/11 0500)     Intake/Output from previous day: 02/10 0701 - 02/11 0700 In: 126 [P.O.:120; I.V.:6] Out: 1775 [Urine:1775] Intake/Output this shift: No intake/output data recorded.  General appearance: alert, cooperative, and mild distress Neurologic: intact Heart: stable SR since transfer to 4E Lungs: normal work of breathing and clear breath sounds. Stable sats on RA Abdomen: soft, no tenderness Extremities: well perfused with minimal edema Wound: the sternotomy and right subclavian incisions are intact and dry.  Lab Results: Recent Labs    04/29/23 0820 04/30/23 0352  WBC 17.7* 13.3*  HGB 10.4* 9.2*  HCT 30.8* 27.1*  PLT 127* 182   BMET:  Recent Labs    04/29/23 0820 04/30/23 0352  NA 131* 135  K 4.0 3.7  CL 94* 95*  CO2 24 25  GLUCOSE 126* 121*  BUN 28* 25*  CREATININE 1.16 1.14  CALCIUM 8.7* 8.8*    PT/INR:  Recent Labs    05/01/23 0330  LABPROT 16.9*  INR 1.3*   ABG     Component Value Date/Time   PHART 7.343 (L) 04/27/2023 0206   HCO3 21.3 04/27/2023 0206   TCO2 22 04/27/2023 0206   ACIDBASEDEF 4.0 (H) 04/27/2023 0206   O2SAT 95 04/27/2023 0206   CBG (last 3)  Recent Labs    04/28/23 1637 04/28/23 2221 04/29/23 0611  GLUCAP 122* 129* 106*    Assessment/Plan: S/P Procedure(s) (LRB): REDO STERNOTOMY (N/A) BENTALL PROCEDURE USING ON-X ASCENDING AORTIC PROSTHESIS SIZE (N/A) REPLACEMENT ASCENDING AORTIC ANEURYSM USING HEMASHIELD PLATINUM GRAFT SIZE (N/A) TRANSESOPHAGEAL ECHOCARDIOGRAM (TEE) (N/A)  -POD 5 re-do sternotomy,  Bentall procedure with a mechanical On-X aortic valve for ascending aortic aneurysm.  Progressing overall. Stable VS and cardiac rhythm past 24 hours. On ASA, metoprolol, lisinopril, and coumadin 4mg  daily. INR 1.3.   He thinks he was taking Coumadin 5mg  daily prior to admission rather than the 4mg  listed on the admission record. He is below pre-op Wt, not on diuretic.   -HEME-Expected acute blood loss anemia- Hct 27%, tolerating well and on iron replacement.   -PULM- On RA, normal work of breathing. Encouraging ambulation and pulm hygiene.   -History of anxiety- Xanax and Lexapro resumed.   -Disposition- anticipate eventual discharge to home with his wife. Needs more mobility and will need to see the INR approaching therapeutic range.    LOS: 11 days    Leary Roca, PA-C 05/01/2023   Chart reviewed,  patient examined, agree with above.  He is doing well overall. Continue ambulation and IS. Home when INR therapeutic.

## 2023-05-02 ENCOUNTER — Telehealth: Payer: Self-pay | Admitting: Adult Health

## 2023-05-02 ENCOUNTER — Other Ambulatory Visit: Payer: Self-pay

## 2023-05-02 DIAGNOSIS — K219 Gastro-esophageal reflux disease without esophagitis: Secondary | ICD-10-CM | POA: Insufficient documentation

## 2023-05-02 DIAGNOSIS — I480 Paroxysmal atrial fibrillation: Secondary | ICD-10-CM | POA: Insufficient documentation

## 2023-05-02 DIAGNOSIS — D72829 Elevated white blood cell count, unspecified: Secondary | ICD-10-CM | POA: Insufficient documentation

## 2023-05-02 DIAGNOSIS — F331 Major depressive disorder, recurrent, moderate: Secondary | ICD-10-CM

## 2023-05-02 DIAGNOSIS — F410 Panic disorder [episodic paroxysmal anxiety] without agoraphobia: Secondary | ICD-10-CM

## 2023-05-02 DIAGNOSIS — D649 Anemia, unspecified: Secondary | ICD-10-CM | POA: Insufficient documentation

## 2023-05-02 DIAGNOSIS — F411 Generalized anxiety disorder: Secondary | ICD-10-CM

## 2023-05-02 DIAGNOSIS — I10 Essential (primary) hypertension: Secondary | ICD-10-CM

## 2023-05-02 DIAGNOSIS — I160 Hypertensive urgency: Secondary | ICD-10-CM | POA: Insufficient documentation

## 2023-05-02 DIAGNOSIS — Z952 Presence of prosthetic heart valve: Secondary | ICD-10-CM | POA: Diagnosis not present

## 2023-05-02 DIAGNOSIS — I77810 Thoracic aortic ectasia: Secondary | ICD-10-CM | POA: Diagnosis not present

## 2023-05-02 DIAGNOSIS — E871 Hypo-osmolality and hyponatremia: Secondary | ICD-10-CM | POA: Insufficient documentation

## 2023-05-02 LAB — CBC WITH DIFFERENTIAL/PLATELET
Abs Immature Granulocytes: 0.48 10*3/uL — ABNORMAL HIGH (ref 0.00–0.07)
Basophils Absolute: 0.1 10*3/uL (ref 0.0–0.1)
Basophils Relative: 1 %
Eosinophils Absolute: 0.2 10*3/uL (ref 0.0–0.5)
Eosinophils Relative: 2 %
HCT: 33.2 % — ABNORMAL LOW (ref 39.0–52.0)
Hemoglobin: 11.4 g/dL — ABNORMAL LOW (ref 13.0–17.0)
Immature Granulocytes: 4 %
Lymphocytes Relative: 24 %
Lymphs Abs: 2.8 10*3/uL (ref 0.7–4.0)
MCH: 29.8 pg (ref 26.0–34.0)
MCHC: 34.3 g/dL (ref 30.0–36.0)
MCV: 86.7 fL (ref 80.0–100.0)
Monocytes Absolute: 1.5 10*3/uL — ABNORMAL HIGH (ref 0.1–1.0)
Monocytes Relative: 13 %
Neutro Abs: 6.5 10*3/uL (ref 1.7–7.7)
Neutrophils Relative %: 56 %
Platelets: 337 10*3/uL (ref 150–400)
RBC: 3.83 MIL/uL — ABNORMAL LOW (ref 4.22–5.81)
RDW: 14.1 % (ref 11.5–15.5)
WBC: 11.5 10*3/uL — ABNORMAL HIGH (ref 4.0–10.5)
nRBC: 0 % (ref 0.0–0.2)

## 2023-05-02 LAB — BASIC METABOLIC PANEL
Anion gap: 16 — ABNORMAL HIGH (ref 5–15)
BUN: 14 mg/dL (ref 6–20)
CO2: 22 mmol/L (ref 22–32)
Calcium: 9.5 mg/dL (ref 8.9–10.3)
Chloride: 99 mmol/L (ref 98–111)
Creatinine, Ser: 0.88 mg/dL (ref 0.61–1.24)
GFR, Estimated: >60 mL/min (ref >60)
Glucose, Bld: 143 mg/dL — ABNORMAL HIGH (ref 70–99)
Potassium: 4.4 mmol/L (ref 3.5–5.1)
Sodium: 137 mmol/L (ref 135–145)

## 2023-05-02 LAB — PROTIME-INR
INR: 1.5 — ABNORMAL HIGH (ref 0.8–1.2)
Prothrombin Time: 18.1 s — ABNORMAL HIGH (ref 11.4–15.2)

## 2023-05-02 LAB — MAGNESIUM: Magnesium: 2 mg/dL (ref 1.7–2.4)

## 2023-05-02 MED ORDER — WARFARIN SODIUM 5 MG PO TABS
5.0000 mg | ORAL_TABLET | Freq: Once | ORAL | Status: AC
  Administered 2023-05-02: 5 mg via ORAL
  Filled 2023-05-02: qty 1

## 2023-05-02 MED ORDER — ESCITALOPRAM OXALATE 10 MG PO TABS: ORAL_TABLET | ORAL | 0 refills | Status: DC

## 2023-05-02 MED ORDER — AMIODARONE HCL 200 MG PO TABS
400.0000 mg | ORAL_TABLET | Freq: Two times a day (BID) | ORAL | Status: DC
  Administered 2023-05-02 – 2023-05-04 (×5): 400 mg via ORAL
  Filled 2023-05-02 (×5): qty 2

## 2023-05-02 NOTE — Telephone Encounter (Signed)
Called pt and notified him that while he's in the hospital we can't send in meds. He said he should be getting released tomorrow and he will call us an let us know.

## 2023-05-02 NOTE — Progress Notes (Addendum)
      [address]       [place] [address]             [phone]        6 Days Post-Op Procedure(s) (LRB): REDO STERNOTOMY (N/A) BENTALL PROCEDURE USING ON-X ASCENDING AORTIC PROSTHESIS SIZE (N/A) REPLACEMENT ASCENDING AORTIC ANEURYSM USING HEMASHIELD PLATINUM GRAFT SIZE (N/A) TRANSESOPHAGEAL ECHOCARDIOGRAM (TEE) (N/A)  Subjective: Patient trying to re position himself in bed. He had a bowel movement.  Objective: Vital signs in last 24 hours: Temp:  [97.9 F (36.6 C)-98.6 F (37 C)] 98.1 F (36.7 C) (02/12 0412) Pulse Rate:  [72-133] 79 (02/12 0412) Cardiac Rhythm: Atrial fibrillation (02/11 1955) Resp:  [16-21] 16 (02/12 0412) BP: (115-147)/(68-82) 118/70 (02/12 0412) SpO2:  [96 %-100 %] 97 % (02/12 0412) Weight:  [96.3 kg] 96.3 kg (02/12 0424)  Pre op weight 102.4 kg Current Weight  05/02/23 96.3 kg      Intake/Output from previous day: 02/11 0701 - 02/12 0700 In: -  Out: 1525 [Urine:1525]   Physical Exam:  Cardiovascular: RRR,, sharp valve click Pulmonary: Clear to auscultation bilaterally Abdomen: Soft, non tender, bowel sounds present. Extremities: Trace bilateral lower extremity edema. Wounds: Clean and dry.  No erythema or signs of infection.  Lab Results: CBC: Recent Labs    04/30/23 0352 05/02/23 0345  WBC 13.3* 11.5*  HGB 9.2* 11.4*  HCT 27.1* 33.2*  PLT 182 337   BMET:  Recent Labs    04/30/23 0352 05/02/23 0345  NA 135 137  K 3.7 4.4  CL 95* 99  CO2 25 22  GLUCOSE 121* 143*  BUN 25* 14  CREATININE 1.14 0.88  CALCIUM 8.8* 9.5    PT/INR:  Lab Results  Component Value Date   INR 1.5 (H) 05/02/2023   INR 1.3 (H) 05/01/2023   INR 1.3 (H) 04/30/2023   ABG:  INR: Will add last result for INR, ABG once components are confirmed Will add last 4 CBG results once components are confirmed  Assessment/Plan:  1. CV - SR. On  Lopressor 25 mg bid and Lisinopril 20 mg daily.  Also, on Coumadin for On-X  mechanical valve. INR this am slightly increased 1.5. Will continue with 5 mg of Coumadin tonight 2.  Pulmonary - On room air. Encourage incentive spirometer. 3.  Expected post op acute blood loss anemia - H and H this am stable at 11.4 and 33.2 4. History of anxiety-on Xanax and Lexapro 5. Disposition-Home when INR therapeutic  Donielle M ZimmermanPA-C 7:16 AM   Chart reviewed, patient examined, agree with above.  He went into atrial fib with RVR overnight and converted with amio. Will switch to po. Will need less Coumadin while on amio.

## 2023-05-02 NOTE — Telephone Encounter (Signed)
Alprazolam lf 1/22 due 2/19

## 2023-05-02 NOTE — Progress Notes (Signed)
Mobility Specialist Progress Note:    05/02/23 1455  Mobility  Activity Ambulated with assistance in hallway  Level of Assistance Standby assist, set-up cues, supervision of patient - no hands on  Assistive Device None  Distance Ambulated (ft) 600 ft  Activity Response Tolerated well  Mobility Referral Yes  Mobility visit 1 Mobility  Mobility Specialist Start Time (ACUTE ONLY) 1400  Mobility Specialist Stop Time (ACUTE ONLY) 1415  Mobility Specialist Time Calculation (min) (ACUTE ONLY) 15 min   Pt received in bed, agreeable to mobility session. Ambulated in hallway, no AD required. SBA for safety. Tolerated well, asx throughout. Returned pt to room, eager for d/c, call bell in reach, all needs met.   Feliciana Rossetti Mobility Specialist Please contact via Special educational needs teacher or  Rehab office at [phone]

## 2023-05-02 NOTE — Progress Notes (Signed)
CARDIAC REHAB PHASE I   PRE:  Rate/Rhythm: 80 SR BBB    BP: lying 106/64    SpO2: 97 RA  MODE:  Ambulation: 470 ft   POST:  Rate/Rhythm: 104 ST BBB    BP: sitting 150/94     SpO2: 100 RA  Pt got out of bed following sternal precautions, required min assist to power up from his side. Ambulated without device, slow and steady, standby assist. VSS. C/o SOB with distance. To recliner.    Discussed with pt IS, sternal precautions, smoking cessation, exercise, and CRPII. Pt receptive. Will refer to [facility] CRPII. Pt avoids vit K by not eating [place] leafy vegetables. [id number]  [name] BS, ACSM-CEP 05/02/2023 11:23 AM

## 2023-05-02 NOTE — Telephone Encounter (Signed)
Sent lexapro 10 mg to [facility].

## 2023-05-02 NOTE — Progress Notes (Signed)
PROGRESS NOTE    Chase Lee  RUE:[id number] DOB: [birth year] DOA: 04/20/2023 PCP: [facility]    No chief complaint on file.   Brief Narrative:  43 year old male with history of anxiety, chronic pain, hypertension and aortic valve replacement x 2 on Coumadin presented with chest pain, headache and dyspnea on exertion. Workup revealed extremely large thoracic aneurysm as well as uncontrolled hypertension. Cardiothoracic surgery was consulted. He underwent surgical intervention by cardiothoracic surgery on 04/26/2023. He was transferred back to [facility] service from 05/01/2023 onwards.    Assessment & Plan:   Principal Problem:   Ascending aorta dilatation (HCC) Active Problems:   Ascending aortic aneurysm (HCC)   HTN (hypertension)   GERD (gastroesophageal reflux disease)   Hypertensive urgency   Paroxysmal atrial fibrillation (HCC)   Hyponatremia   Leukocytosis   Anemia  #1 symptomatic ascending and aortic root aneurysm -Patient presented with chest pain, headache, dyspnea on exertion. -Imaging and workup done concerning for large thoracic aneurysm as well as uncontrolled hypertension. -Patient seen in consultation by CT surgery and patient underwent surgical intervention on 04/26/2023. -Patient subsequently transferred back to [facility] service on 05/01/2023. -Patient being followed by cardiothoracic surgery. -Per CT surgery.  2.  Paroxysmal atrial fibrillation -Noted to go into A-fib overnight and was placed on amiodarone drip. -Patient currently back in normal sinus rhythm and has been transition from IV amiodarone to oral amiodarone per CT surgery recommendations. -Continue Lopressor. -INR subtherapeutic at 1.5. -Coumadin for anticoagulation, Coumadin per pharmacy.  3.  Hypertension/hypertensive urgency -Resolved. -Continue Lopressor and lisinopril.  4.  Hyponatremia -Resolved.  5.  Leukocytosis -Likely reactive leukocytosis. -WBC trending down. -Follow.  6.   Anemia of chronic disease -H&H stable.  7.  Thrombocytopenia -Resolved.  8.  Depression and anxiety -Continue Lexapro and Xanax.  9.  GERD -PPI.  10.  Chronic pain with opioid dependence -Continue current pain management. -Will need outpatient follow-up with PCP for further pain management.     DVT prophylaxis: Coumadin Code Status: Full Family Communication: Updated patient.  No family at bedside. Disposition: TBD  Status is: Inpatient Remains inpatient appropriate because: Severity of illness   Consultants:  CT surgery PCCM  Procedures:  Cardiac CT 04/23/2023 CT angiogram chest 04/19/2023 Carotid ultrasound 04/23/2023 Intraoperative TEE 04/25/2023 Redo median sternotomy/extracorporeal circulation via right axillary artery and right femoral vein/right axillary cannulation with 8 mm Hemashield graft/replacement of ascending aorta using 32 mm Hemashield graft under deep hypothermic secretory arrest/removal of old mechanical valve/Bentall procedure using a 25 mm On-X mechanical valve conduit, reimplantation of both right and left coronary arteries per CT surgery: Dr. Laneta Simmers 04/26/2023  Antimicrobials:  Anti-infectives (From admission, onward)    Start     Dose/Rate Route Frequency Ordered Stop   04/27/23 0030  vancomycin (VANCOCIN) IVPB 1000 mg/200 mL premix        1,000 mg 200 mL/hr over 60 Minutes Intravenous  Once 04/26/23 1803 04/27/23 0056   04/26/23 1830  ceFAZolin (ANCEF) IVPB 2g/100 mL premix        2 g 200 mL/hr over 30 Minutes Intravenous Every 8 hours 04/26/23 1803 04/28/23 1404   04/26/23 0400  Vancomycin (VANCOCIN) 1,500 mg in sodium chloride 0.9 % 500 mL IVPB        1,500 mg 250 mL/hr over 120 Minutes Intravenous To Surgery 04/25/23 1310 04/26/23 1835   04/26/23 0400  ceFAZolin (ANCEF) IVPB 2g/100 mL premix        2 g 200 mL/hr over 30 Minutes Intravenous  To Surgery 04/25/23 1310 04/26/23 1616   04/26/23 0400  ceFAZolin (ANCEF) IVPB 2g/100 mL premix  Status:   Discontinued        2 g 200 mL/hr over 30 Minutes Intravenous To Surgery 04/25/23 1310 04/26/23 1803         Subjective: Patient states he ambulated in the hallway twice with no significant shortness of breath or chest pain.  No abdominal pain.  Complains of some intermittent twinges of pain on his left side.  Patient states heart rate went up into the 180s overnight and had to be placed on IV medication. Patient noted overnight to go into A-fib, placed on IV amiodarone and converted back to normal sinus rhythm.  Objective: Vitals:   05/02/23 0412 05/02/23 0424 05/02/23 0739 05/02/23 1100  BP: 118/70  118/70 (!) 150/94  Pulse: 79  84 98  Resp: 16  19 (!) 21  Temp: 98.1 F (36.7 C)  98.3 F (36.8 C)   TempSrc: Oral  Oral   SpO2: 97%  100% 98%  Weight:  96.3 kg    Height:        Intake/Output Summary (Last 24 hours) at 05/02/2023 1838 Last data filed at 05/02/2023 0400 Gross per 24 hour  Intake --  Output 550 ml  Net -550 ml   Filed Weights   04/30/23 0327 05/01/23 0500 05/02/23 0424  Weight: 98.2 kg 96.8 kg 96.3 kg    Examination:  General exam: Appears calm and comfortable  Respiratory system: Clear to auscultation anterior lung fields.  No wheezes, no crackles, no rhonchi.  Fair air movement.Marland Kitchen Respiratory effort normal. Cardiovascular system: S1 & S2 heard, RRR. No JVD, murmurs, rubs, gallops or clicks. No pedal edema.  Midsternal wound/incision site clean and dry, no erythema or signs of infection. Gastrointestinal system: Abdomen is nondistended, soft and nontender. No organomegaly or masses felt. Normal bowel sounds heard. Central nervous system: Alert and oriented. No focal neurological deficits. Extremities: Symmetric 5 x 5 power. Skin: No rashes, lesions or ulcers Psychiatry: Judgement and insight appear normal. Mood & affect appropriate.     Data Reviewed: I have personally reviewed following labs and imaging studies  CBC: Recent Labs  Lab 04/27/23 1750  04/28/23 0345 04/29/23 0820 04/30/23 0352 05/02/23 0345  WBC 16.0* 19.7* 17.7* 13.3* 11.5*  NEUTROABS  --   --   --   --  6.5  HGB 10.9* 11.2* 10.4* 9.2* 11.4*  HCT 33.0* 33.5* 30.8* 27.1* 33.2*  MCV 89.4 89.8 89.5 88.9 86.7  PLT 123* 132* 127* 182 337    Basic Metabolic Panel: Recent Labs  Lab 04/26/23 0431 04/26/23 0821 04/27/23 0028 04/27/23 0206 04/27/23 0434 04/27/23 1750 04/28/23 0345 04/29/23 0820 04/30/23 0352 05/02/23 0345  NA 135   < > 133*   < > 133* 133* 133* 131* 135 137  K 4.4   < > 4.9   < > 5.1 4.4 4.5 4.0 3.7 4.4  CL 101   < > 103  --  104 97* 94* 94* 95* 99  CO2 25  --  22  --  23 26 25 24 25 22   GLUCOSE 140*   < > 176*  --  113* 146* 166* 126* 121* 143*  BUN 19   < > 19  --  18 18 18  28* 25* 14  CREATININE 1.16   < > 1.24  --  1.04 1.03 0.86 1.16 1.14 0.88  CALCIUM 8.9  --  7.5*  --  7.5*  8.2* 8.7* 8.7* 8.8* 9.5  MG 2.0  --  3.3*  --  2.9* 2.7*  --   --   --  2.0   < > = values in this interval not displayed.    GFR: Estimated Creatinine Clearance: 115.2 mL/min (by C-G formula based on SCr of 0.88 mg/dL).  Liver Function Tests: No results for input(s): "AST", "ALT", "ALKPHOS", "BILITOT", "PROT", "ALBUMIN" in the last 168 hours.  CBG: Recent Labs  Lab 04/28/23 0756 04/28/23 1145 04/28/23 1637 04/28/23 2221 04/29/23 0611  GLUCAP 168* 171* 122* 129* 106*     No results found for this or any previous visit (from the past 240 hours).       Radiology Studies: No results found.      Scheduled Meds:  ALPRAZolam  1 mg Oral TID AC   amiodarone  400 mg Oral BID   aspirin EC  81 mg Oral Daily   calcium carbonate  1 tablet Oral TID WC   Chlorhexidine Gluconate Cloth  6 each Topical Daily   docusate sodium  200 mg Oral Daily   escitalopram  10 mg Oral QHS   Fe Fum-Vit C-Vit B12-FA  1 capsule Oral QPC breakfast   lisinopril  20 mg Oral Daily   metoprolol tartrate  25 mg Oral BID   pantoprazole  40 mg Oral Daily   sodium chloride  flush  3 mL Intravenous Q12H   Warfarin - Physician Dosing Inpatient   Does not apply q1600   Continuous Infusions:   LOS: 12 days    Time spent: 40 minutes    Ramiro Harvest, MD [facility]   To contact the attending provider between 7A-7P or the covering provider during after hours 7P-7A, please log into the web site [email or web address] and access using universal [place] password for that web site. If you do not have the password, please call the hospital operator.  05/02/2023, 6:38 PM

## 2023-05-02 NOTE — Telephone Encounter (Signed)
Patient called back in regarding previous messages and states that he being discharged from hospital and needs medications Lexapro 10mg  and Alprazplam 1mg  sent in. [facility]: [phone] Pharmacy [facility] [address] [place], [place]

## 2023-05-02 NOTE — Telephone Encounter (Signed)
Dianah Field called. His last appt with [name] was on 04/02/23 but a f/u appt was never done. 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 He continues in the hospital now. He asks if his Xanax and Lexapro be filled by [name]? His phone number is [phone]. Pharmacy is:  [facility] [id number] - , [place] - [address] AT [facility] [name]   Phone: [phone]  Fax: [phone]

## 2023-05-02 NOTE — Telephone Encounter (Signed)
Chase Lee called. His last appt with [name] was on 04/02/23 but a f/u appt was never done. 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 He continues in the hospital now. He asks if his Xanax and Lexapro be filled by [name]? His phone number is [phone]. Pharmacy is:  [facility] [id number] - , [place] - [address] AT [facility] [name]   Phone: [phone]  Fax: [phone]

## 2023-05-03 DIAGNOSIS — Z952 Presence of prosthetic heart valve: Secondary | ICD-10-CM | POA: Diagnosis not present

## 2023-05-03 DIAGNOSIS — I160 Hypertensive urgency: Secondary | ICD-10-CM | POA: Diagnosis not present

## 2023-05-03 DIAGNOSIS — D649 Anemia, unspecified: Secondary | ICD-10-CM | POA: Diagnosis not present

## 2023-05-03 DIAGNOSIS — I77810 Thoracic aortic ectasia: Secondary | ICD-10-CM | POA: Diagnosis not present

## 2023-05-03 LAB — CBC
HCT: 31.7 % — ABNORMAL LOW (ref 39.0–52.0)
Hemoglobin: 10.8 g/dL — ABNORMAL LOW (ref 13.0–17.0)
MCH: 29.8 pg (ref 26.0–34.0)
MCHC: 34.1 g/dL (ref 30.0–36.0)
MCV: 87.6 fL (ref 80.0–100.0)
Platelets: 406 10*3/uL — ABNORMAL HIGH (ref 150–400)
RBC: 3.62 MIL/uL — ABNORMAL LOW (ref 4.22–5.81)
RDW: 14.3 % (ref 11.5–15.5)
WBC: 15.8 10*3/uL — ABNORMAL HIGH (ref 4.0–10.5)
nRBC: 0 % (ref 0.0–0.2)

## 2023-05-03 LAB — PROTIME-INR
INR: 1.7 — ABNORMAL HIGH (ref 0.8–1.2)
Prothrombin Time: 20.6 s — ABNORMAL HIGH (ref 11.4–15.2)

## 2023-05-03 LAB — BASIC METABOLIC PANEL
Anion gap: 9 (ref 5–15)
BUN: 12 mg/dL (ref 6–20)
CO2: 22 mmol/L (ref 22–32)
Calcium: 8.9 mg/dL (ref 8.9–10.3)
Chloride: 100 mmol/L (ref 98–111)
Creatinine, Ser: 0.96 mg/dL (ref 0.61–1.24)
GFR, Estimated: >60 mL/min (ref >60)
Glucose, Bld: 216 mg/dL — ABNORMAL HIGH (ref 70–99)
Potassium: 4.4 mmol/L (ref 3.5–5.1)
Sodium: 131 mmol/L — ABNORMAL LOW (ref 135–145)

## 2023-05-03 LAB — MAGNESIUM: Magnesium: 1.9 mg/dL (ref 1.7–2.4)

## 2023-05-03 MED ORDER — WARFARIN SODIUM 5 MG PO TABS
5.0000 mg | ORAL_TABLET | Freq: Once | ORAL | Status: AC
  Administered 2023-05-03: 5 mg via ORAL
  Filled 2023-05-03: qty 1

## 2023-05-03 NOTE — Progress Notes (Signed)
Mobility Specialist Progress Note:    05/03/23 0957  Mobility  Activity Ambulated with assistance in hallway  Level of Assistance Standby assist, set-up cues, supervision of patient - no hands on  Assistive Device None  Distance Ambulated (ft) 200 ft  Activity Response Tolerated well  Mobility Referral Yes  Mobility visit 1 Mobility  Mobility Specialist Start Time (ACUTE ONLY) 0930  Mobility Specialist Stop Time (ACUTE ONLY) 0940  Mobility Specialist Time Calculation (min) (ACUTE ONLY) 10 min   Pt received in bed, agreeable to mobility session. Wife at bedside. Ambulated in hallway, no AD, SBA for safety. Tolerated well, asx throughout. Returned to room, requested to use bathroom. Void successful. Left with all needs met.    Feliciana Rossetti Mobility Specialist Please contact via Special educational needs teacher or  Rehab office at [phone]

## 2023-05-03 NOTE — Plan of Care (Signed)

## 2023-05-03 NOTE — Progress Notes (Signed)
PROGRESS NOTE    Chase Lee  BJY:[id number] DOB: 1980/10/06 DOA: 04/20/2023 PCP: [facility]    No chief complaint on file.   Brief Narrative:  43 year old male with history of anxiety, chronic pain, hypertension and aortic valve replacement x 2 on Coumadin presented with chest pain, headache and dyspnea on exertion. Workup revealed extremely large thoracic aneurysm as well as uncontrolled hypertension. Cardiothoracic surgery was consulted. He underwent surgical intervention by cardiothoracic surgery on 04/26/2023. He was transferred back to [facility] service from 05/01/2023 onwards.    Assessment & Plan:   Principal Problem:   Ascending aorta dilatation (HCC) Active Problems:   Ascending aortic aneurysm (HCC)   HTN (hypertension)   GERD (gastroesophageal reflux disease)   Hypertensive urgency   Paroxysmal atrial fibrillation (HCC)   Hyponatremia   Leukocytosis   Anemia  #1 symptomatic ascending and aortic root aneurysm -Patient presented with chest pain, headache, dyspnea on exertion. -Imaging and workup done concerning for large thoracic aneurysm as well as uncontrolled hypertension. -Patient seen in consultation by CT surgery and patient underwent surgical intervention on 04/26/2023. -Patient subsequently transferred back to [facility] service on 05/01/2023. -Patient being followed by cardiothoracic surgery. -Per CT surgery.  2.  Paroxysmal atrial fibrillation -Noted to go into A-fib the evening of 05/01/2023 as well as 05/02/2023.  Patient initially was placed on amiodarone drip the evening of 05/01/2023 and transition to oral amiodarone. -Patient currently back in normal sinus rhythm and on oral amiodarone per CT surgery recommendations.  -Continue Lopressor. -INR subtherapeutic at 1.7. -Coumadin for anticoagulation, Coumadin per pharmacy.  3.  Hypertension/hypertensive urgency -Resolved. -Continue Lopressor and lisinopril.  4.  Hyponatremia -Fluctuating.   5.   Leukocytosis -Likely reactive leukocytosis. -WBC fluctuating.   -Patient afebrile.   -Follow.  6.  Anemia of chronic disease -H&H stable.  7.  Thrombocytopenia -Resolved.  8.  Depression and anxiety -Continue Xanax, Lexapro.   9.  GERD -Continue PPI.   10.  Chronic pain with opioid dependence -Continue current pain management. -Will need outpatient follow-up with PCP for further pain management.     DVT prophylaxis: Coumadin Code Status: Full Family Communication: Updated patient and wife at bedside. Disposition: Likely home when cleared by cardiothoracic surgery and INR therapeutic.   Status is: Inpatient Remains inpatient appropriate because: Severity of illness   Consultants:  CT surgery PCCM  Procedures:  Cardiac CT 04/23/2023 CT angiogram chest 04/19/2023 Carotid ultrasound 04/23/2023 Intraoperative TEE 04/25/2023 Redo median sternotomy/extracorporeal circulation via right axillary artery and right femoral vein/right axillary cannulation with 8 mm Hemashield graft/replacement of ascending aorta using 32 mm Hemashield graft under deep hypothermic secretory arrest/removal of old mechanical valve/Bentall procedure using a 25 mm On-X mechanical valve conduit, reimplantation of both right and left coronary arteries per CT surgery: Dr. Laneta Simmers 04/26/2023  Antimicrobials:  Anti-infectives (From admission, onward)    Start     Dose/Rate Route Frequency Ordered Stop   04/27/23 0030  vancomycin (VANCOCIN) IVPB 1000 mg/200 mL premix        1,000 mg 200 mL/hr over 60 Minutes Intravenous  Once 04/26/23 1803 04/27/23 0056   04/26/23 1830  ceFAZolin (ANCEF) IVPB 2g/100 mL premix        2 g 200 mL/hr over 30 Minutes Intravenous Every 8 hours 04/26/23 1803 04/28/23 1404   04/26/23 0400  Vancomycin (VANCOCIN) 1,500 mg in sodium chloride 0.9 % 500 mL IVPB        1,500 mg 250 mL/hr over 120 Minutes Intravenous To Surgery  04/25/23 1310 04/26/23 1835   04/26/23 0400  ceFAZolin (ANCEF)  IVPB 2g/100 mL premix        2 g 200 mL/hr over 30 Minutes Intravenous To Surgery 04/25/23 1310 04/26/23 1616   04/26/23 0400  ceFAZolin (ANCEF) IVPB 2g/100 mL premix  Status:  Discontinued        2 g 200 mL/hr over 30 Minutes Intravenous To Surgery 04/25/23 1310 04/26/23 1803         Subjective: Sitting up at the side of the bed eating lunch with his wife.  Denies any significant chest pain.  Denies any significant shortness of breath.  Overall states he is feeling much better.  Feels amiodarone is helping with his heart rate.  Noted to go back into A-fib with RVR overnight which has since improved and patient back in normal sinus rhythm.  Objective: Vitals:   05/02/23 2253 05/03/23 0257 05/03/23 0805 05/03/23 1200  BP: 116/62 106/63 119/67 109/66  Pulse: 80 81 88 87  Resp: 20 20 (!) 21 18  Temp: 99.5 F (37.5 C) 98.8 F (37.1 C) 97.9 F (36.6 C) 98.3 F (36.8 C)  TempSrc: Oral Oral Oral Oral  SpO2: 93% 94% 95% 95%  Weight:  96.1 kg    Height:       No intake or output data in the 24 hours ending 05/03/23 1216  Filed Weights   05/01/23 0500 05/02/23 0424 05/03/23 0257  Weight: 96.8 kg 96.3 kg 96.1 kg    Examination:  General exam: NAD Respiratory system: CTAB.  No wheezes, no crackles, no rhonchi.  Fair air movement.  Speaking in full sentences.  Cardiovascular system: Regular rate rhythm no murmurs rubs or gallops.  No JVD.  No pitting lower extremity edema.  Midsternal wound/incision site clean and dry, no erythema, no exudate, no signs of infection. Gastrointestinal system: Abdomen is soft, nontender, nondistended, positive bowel sounds.  No rebound.  No guarding. Central nervous system: Alert and oriented. No focal neurological deficits. Extremities: Symmetric 5 x 5 power. Skin: No rashes, lesions or ulcers Psychiatry: Judgement and insight appear normal. Mood & affect appropriate.     Data Reviewed: I have personally reviewed following labs and imaging  studies  CBC: Recent Labs  Lab 04/28/23 0345 04/29/23 0820 04/30/23 0352 05/02/23 0345 05/03/23 0809  WBC 19.7* 17.7* 13.3* 11.5* 15.8*  NEUTROABS  --   --   --  6.5  --   HGB 11.2* 10.4* 9.2* 11.4* 10.8*  HCT 33.5* 30.8* 27.1* 33.2* 31.7*  MCV 89.8 89.5 88.9 86.7 87.6  PLT 132* 127* 182 337 406*    Basic Metabolic Panel: Recent Labs  Lab 04/27/23 0028 04/27/23 0206 04/27/23 0434 04/27/23 1750 04/28/23 0345 04/29/23 0820 04/30/23 0352 05/02/23 0345 05/03/23 0809  NA 133*   < > 133* 133* 133* 131* 135 137 131*  K 4.9   < > 5.1 4.4 4.5 4.0 3.7 4.4 4.4  CL 103  --  104 97* 94* 94* 95* 99 100  CO2 22  --  23 26 25 24 25 22 22   GLUCOSE 176*  --  113* 146* 166* 126* 121* 143* 216*  BUN 19  --  18 18 18  28* 25* 14 12  CREATININE 1.24  --  1.04 1.03 0.86 1.16 1.14 0.88 0.96  CALCIUM 7.5*  --  7.5* 8.2* 8.7* 8.7* 8.8* 9.5 8.9  MG 3.3*  --  2.9* 2.7*  --   --   --  2.0 1.9   < > =  values in this interval not displayed.    GFR: Estimated Creatinine Clearance: 105.4 mL/min (by C-G formula based on SCr of 0.96 mg/dL).  Liver Function Tests: No results for input(s): "AST", "ALT", "ALKPHOS", "BILITOT", "PROT", "ALBUMIN" in the last 168 hours.  CBG: Recent Labs  Lab 04/28/23 0756 04/28/23 1145 04/28/23 1637 04/28/23 2221 04/29/23 0611  GLUCAP 168* 171* 122* 129* 106*     No results found for this or any previous visit (from the past 240 hours).       Radiology Studies: No results found.      Scheduled Meds:  ALPRAZolam  1 mg Oral TID AC   amiodarone  400 mg Oral BID   aspirin EC  81 mg Oral Daily   calcium carbonate  1 tablet Oral TID WC   Chlorhexidine Gluconate Cloth  6 each Topical Daily   docusate sodium  200 mg Oral Daily   escitalopram  10 mg Oral QHS   Fe Fum-Vit C-Vit B12-FA  1 capsule Oral QPC breakfast   lisinopril  20 mg Oral Daily   metoprolol tartrate  25 mg Oral BID   pantoprazole  40 mg Oral Daily   sodium chloride flush  3 mL  Intravenous Q12H   Warfarin - Physician Dosing Inpatient   Does not apply q1600   Continuous Infusions:   LOS: 13 days    Time spent: 40 minutes    Ramiro Harvest, MD [facility]   To contact the attending provider between 7A-7P or the covering provider during after hours 7P-7A, please log into the web site [email or web address] and access using universal [place] password for that web site. If you do not have the password, please call the hospital operator.  05/03/2023, 12:16 PM

## 2023-05-03 NOTE — Progress Notes (Signed)
      [address]       [place] [address]             [phone]      7 Days Post-Op Procedure(s) (LRB): REDO STERNOTOMY (N/A) BENTALL PROCEDURE USING ON-X ASCENDING AORTIC PROSTHESIS SIZE (N/A) REPLACEMENT ASCENDING AORTIC ANEURYSM USING HEMASHIELD PLATINUM GRAFT SIZE (N/A) TRANSESOPHAGEAL ECHOCARDIOGRAM (TEE) (N/A) Subjective:  Sitting up eating breakfast, said he feels good and has no concerns.   Lab tech was unable to draw blood for the PT / INR this morning.   Objective: Vital signs in last 24 hours: Temp:  [97.9 F (36.6 C)-99.7 F (37.6 C)] 98.8 F (37.1 C) (02/13 0257) Pulse Rate:  [80-103] 81 (02/13 0257) Cardiac Rhythm: Sinus tachycardia (02/12 1930) Resp:  [19-21] 20 (02/13 0257) BP: (102-150)/(59-94) 106/63 (02/13 0257) SpO2:  [93 %-100 %] 94 % (02/13 0257) Weight:  [96.1 kg] 96.1 kg (02/13 0257)     Intake/Output from previous day: No intake/output data recorded. Intake/Output this shift: No intake/output data recorded.  General appearance: alert, cooperative, and no distress Neurologic: intact Heart: Currently in SR, was in a-fib in the 140's last evening.  Lungs: normal work of breathing and clear breath sounds. Stable sats on RA Abdomen: soft, no tenderness Extremities: well perfused with minimal edema Wound: the sternotomy and right subclavian incisions are intact and dry.  Lab Results: Recent Labs    05/02/23 0345  WBC 11.5*  HGB 11.4*  HCT 33.2*  PLT 337   BMET:  Recent Labs    05/02/23 0345  NA 137  K 4.4  CL 99  CO2 22  GLUCOSE 143*  BUN 14  CREATININE 0.88  CALCIUM 9.5    PT/INR:  Recent Labs    05/02/23 0345  LABPROT 18.1*  INR 1.5*   ABG    Component Value Date/Time   PHART 7.343 (L) 04/27/2023 0206   HCO3 21.3 04/27/2023 0206   TCO2 22 04/27/2023 0206   ACIDBASEDEF 4.0 (H) 04/27/2023 0206   O2SAT 95 04/27/2023 0206   CBG (last 3)  No results for input(s): "GLUCAP" in the last  72 hours.   Assessment/Plan: S/P Procedure(s) (LRB): REDO STERNOTOMY (N/A) BENTALL PROCEDURE USING ON-X ASCENDING AORTIC PROSTHESIS SIZE (N/A) REPLACEMENT ASCENDING AORTIC ANEURYSM USING HEMASHIELD PLATINUM GRAFT SIZE (N/A) TRANSESOPHAGEAL ECHOCARDIOGRAM (TEE) (N/A)  -POD 7 re-do sternotomy,  Bentall procedure with a mechanical On-X aortic valve for ascending aortic aneurysm.  Progressing overall. Stable VS.  On ASA, metoprolol, lisinopril, and coumadin 5mg  daily. INR was 1.5 yesterday, today's lab pending.    -Post-op atrial fibrillation- on oral amio, currently in SR but had another episode of AF with RVR last evening.   -HEME-Expected acute blood loss anemia- Hct 33% and trending up, on iron replacement.   -PULM- On RA, normal work of breathing. Encouraging ambulation and pulm hygiene.   -History of anxiety- Xanax and Lexapro resumed.   -Disposition- anticipate eventual discharge to home with his wife if INR acceptable.    LOS: 13 days    Leary Roca, PA-C 05/03/2023

## 2023-05-04 ENCOUNTER — Other Ambulatory Visit: Payer: Self-pay | Admitting: Physician Assistant

## 2023-05-04 LAB — CBC WITH DIFFERENTIAL/PLATELET
Abs Immature Granulocytes: 0.67 10*3/uL — ABNORMAL HIGH (ref 0.00–0.07)
Basophils Absolute: 0.1 10*3/uL (ref 0.0–0.1)
Basophils Relative: 1 %
Eosinophils Absolute: 0.3 10*3/uL (ref 0.0–0.5)
Eosinophils Relative: 2 %
HCT: 33 % — ABNORMAL LOW (ref 39.0–52.0)
Hemoglobin: 11 g/dL — ABNORMAL LOW (ref 13.0–17.0)
Immature Granulocytes: 5 %
Lymphocytes Relative: 22 %
Lymphs Abs: 3.3 10*3/uL (ref 0.7–4.0)
MCH: 29.7 pg (ref 26.0–34.0)
MCHC: 33.3 g/dL (ref 30.0–36.0)
MCV: 89.2 fL (ref 80.0–100.0)
Monocytes Absolute: 1.7 10*3/uL — ABNORMAL HIGH (ref 0.1–1.0)
Monocytes Relative: 12 %
Neutro Abs: 8.8 10*3/uL — ABNORMAL HIGH (ref 1.7–7.7)
Neutrophils Relative %: 58 %
Platelets: 490 10*3/uL — ABNORMAL HIGH (ref 150–400)
RBC: 3.7 MIL/uL — ABNORMAL LOW (ref 4.22–5.81)
RDW: 14.1 % (ref 11.5–15.5)
WBC: 14.9 10*3/uL — ABNORMAL HIGH (ref 4.0–10.5)
nRBC: 0 % (ref 0.0–0.2)

## 2023-05-04 LAB — BASIC METABOLIC PANEL
Anion gap: 11 (ref 5–15)
BUN: 12 mg/dL (ref 6–20)
CO2: 25 mmol/L (ref 22–32)
Calcium: 9 mg/dL (ref 8.9–10.3)
Chloride: 95 mmol/L — ABNORMAL LOW (ref 98–111)
Creatinine, Ser: 1.01 mg/dL (ref 0.61–1.24)
GFR, Estimated: >60 mL/min (ref >60)
Glucose, Bld: 153 mg/dL — ABNORMAL HIGH (ref 70–99)
Potassium: 4.1 mmol/L (ref 3.5–5.1)
Sodium: 131 mmol/L — ABNORMAL LOW (ref 135–145)

## 2023-05-04 LAB — PROTIME-INR
INR: 1.8 — ABNORMAL HIGH (ref 0.8–1.2)
Prothrombin Time: 21.3 s — ABNORMAL HIGH (ref 11.4–15.2)

## 2023-05-04 MED ORDER — AMIODARONE HCL 200 MG PO TABS: 400.0000 mg | ORAL_TABLET | Freq: Two times a day (BID) | ORAL | 1 refills | Status: DC

## 2023-05-04 MED ORDER — ASPIRIN 81 MG PO TBEC: 81.0000 mg | DELAYED_RELEASE_TABLET | Freq: Every day | ORAL | 12 refills | Status: DC

## 2023-05-04 MED ORDER — WARFARIN SODIUM 5 MG PO TABS: 5.0000 mg | ORAL_TABLET | Freq: Every day | ORAL | 5 refills | Status: DC

## 2023-05-04 MED ORDER — OXYCODONE HCL 10 MG PO TABS: 5.0000 mg | ORAL_TABLET | ORAL | 0 refills | Status: DC | PRN

## 2023-05-04 MED ORDER — OXYCODONE HCL 5 MG PO TABS: 5.0000 mg | ORAL_TABLET | ORAL | 0 refills | Status: DC | PRN

## 2023-05-04 NOTE — Progress Notes (Signed)
      [address]       [place] [id number]             [phone]      8 Days Post-Op Procedure(s) (LRB): REDO STERNOTOMY (N/A) BENTALL PROCEDURE USING ON-X ASCENDING AORTIC PROSTHESIS SIZE (N/A) REPLACEMENT ASCENDING AORTIC ANEURYSM USING HEMASHIELD PLATINUM GRAFT SIZE (N/A) TRANSESOPHAGEAL ECHOCARDIOGRAM (TEE) (N/A) Subjective:  Had a good day yesterday, eager to return home. No problems or concerns.   Objective: Vital signs in last 24 hours: Temp:  [97.9 F (36.6 C)-99 F (37.2 C)] 99 F (37.2 C) (02/14 0423) Pulse Rate:  [80-88] 80 (02/14 0552) Cardiac Rhythm: Normal sinus rhythm (02/13 2029) Resp:  [16-25] 25 (02/14 0552) BP: (101-126)/(54-71) 101/54 (02/14 0423) SpO2:  [93 %-96 %] 93 % (02/14 0552) Weight:  [96.2 kg] 96.2 kg (02/14 0552)     Intake/Output from previous day: 02/13 0701 - 02/14 0700 In: -  Out: 500 [Urine:500] Intake/Output this shift: No intake/output data recorded.  General appearance: alert, cooperative, and no distress Neurologic: intact Heart: remains in SR with  occasional sinus tach  Lungs: normal work of breathing and clear breath sounds. Stable sats on RA Abdomen: soft, no tenderness Extremities: well perfused with no peripheral edema Wound: the sternotomy and right subclavian incisions are intact and dry.  Lab Results: Recent Labs    05/03/23 0809 05/04/23 0342  WBC 15.8* 14.9*  HGB 10.8* 11.0*  HCT 31.7* 33.0*  PLT 406* 490*   BMET:  Recent Labs    05/03/23 0809 05/04/23 0342  NA 131* 131*  K 4.4 4.1  CL 100 95*  CO2 22 25  GLUCOSE 216* 153*  BUN 12 12  CREATININE 0.96 1.01  CALCIUM 8.9 9.0    PT/INR:  Recent Labs    05/04/23 0342  LABPROT 21.3*  INR 1.8*   ABG    Component Value Date/Time   PHART 7.343 (L) 04/27/2023 0206   HCO3 21.3 04/27/2023 0206   TCO2 22 04/27/2023 0206   ACIDBASEDEF 4.0 (H) 04/27/2023 0206   O2SAT 95 04/27/2023 0206   CBG (last 3)  No results  for input(s): "GLUCAP" in the last 72 hours.   Assessment/Plan: S/P Procedure(s) (LRB): REDO STERNOTOMY (N/A) BENTALL PROCEDURE USING ON-X ASCENDING AORTIC PROSTHESIS SIZE (N/A) REPLACEMENT ASCENDING AORTIC ANEURYSM USING HEMASHIELD PLATINUM GRAFT SIZE (N/A) TRANSESOPHAGEAL ECHOCARDIOGRAM (TEE) (N/A)  -POD 8 re-do sternotomy,  Bentall procedure with a mechanical On-X aortic valve for ascending aortic aneurysm.  Progressing overall. Stable BP.  On ASA, metoprolol, lisinopril, and Coumadin 5mg  daily. INR was 1.8   -Post-op atrial fibrillation- on oral amio loading, no AF past 24 hours.   -HEME-Expected acute blood loss anemia- Hct 33% and trending up, on iron replacement.   -PULM- On RA, normal work of breathing.   -History of anxiety- Xanax and Lexapro resumed.   -Disposition- will discharge to home with his wife today on Coumadin 5mg  daily. Plan to taper the amiodarone to 200mg  BID in 5 days then decrease to 200mg  daily in 14 days. Recheck the INR on Monday.   LOS: 14 days    Leary Roca, PA-C 05/04/2023

## 2023-05-04 NOTE — Plan of Care (Signed)
  Problem: Health Behavior/Discharge Planning: Goal: Ability to manage health-related needs will improve Outcome: Progressing   Problem: Clinical Measurements: Goal: Ability to maintain clinical measurements within normal limits will improve Outcome: Progressing Goal: Will remain free from infection Outcome: Progressing Goal: Diagnostic test results will improve Outcome: Progressing Goal: Respiratory complications will improve Outcome: Progressing Goal: Cardiovascular complication will be avoided Outcome: Progressing   Problem: Activity: Goal: Risk for activity intolerance will decrease Outcome: Progressing   Problem: Nutrition: Goal: Adequate nutrition will be maintained Outcome: Progressing   Problem: Pain Managment: Goal: General experience of comfort will improve and/or be controlled Outcome: Progressing   Problem: Safety: Goal: Ability to remain free from injury will improve Outcome: Progressing   Problem: Skin Integrity: Goal: Risk for impaired skin integrity will decrease Outcome: Progressing   Problem: Education: Goal: Will demonstrate proper wound care and an understanding of methods to prevent future damage Outcome: Progressing Goal: Knowledge of disease or condition will improve Outcome: Progressing Goal: Knowledge of the prescribed therapeutic regimen will improve Outcome: Progressing Goal: Individualized Educational Video(s) Outcome: Progressing   Problem: Cardiac: Goal: Will achieve and/or maintain hemodynamic stability Outcome: Progressing   Problem: Activity: Goal: Risk for activity intolerance will decrease Outcome: Progressing   Problem: Skin Integrity: Goal: Wound healing without signs and symptoms of infection Outcome: Progressing Goal: Risk for impaired skin integrity will decrease Outcome: Progressing   Problem: Activity: Goal: Risk for activity intolerance will decrease Outcome: Progressing   Problem: Respiratory: Goal:  Respiratory status will improve Outcome: Progressing   Problem: Skin Integrity: Goal: Wound healing without signs and symptoms of infection Outcome: Progressing Goal: Risk for impaired skin integrity will decrease Outcome: Progressing

## 2023-05-04 NOTE — Progress Notes (Signed)
Discharge instructions reviewed with pt and his wife. Copy of instructions given to pt. Pt informed his scripts were sent to his pharmacy for pick up. Pt getting dressed, wife getting car.  Pt will be d/c'd via wheelchair with belongings, with wife and will be            escorted by hospital volunteer.   Navraj Dreibelbis,RN SWOT

## 2023-05-06 NOTE — Telephone Encounter (Signed)
 Pt does need to get rescheduled with [name] since he had to cancel.

## 2023-05-07 ENCOUNTER — Telehealth: Payer: Self-pay | Admitting: *Deleted

## 2023-05-07 ENCOUNTER — Ambulatory Visit: Payer: 59 | Attending: Internal Medicine | Admitting: *Deleted

## 2023-05-07 DIAGNOSIS — Z5181 Encounter for therapeutic drug level monitoring: Secondary | ICD-10-CM | POA: Diagnosis not present

## 2023-05-07 DIAGNOSIS — I480 Paroxysmal atrial fibrillation: Secondary | ICD-10-CM | POA: Diagnosis not present

## 2023-05-07 LAB — ECHO INTRAOPERATIVE TEE
Height: 66 in
Weight: 3568 [oz_av]

## 2023-05-07 LAB — POCT INR: INR: 2.7 (ref 2.0–3.0)

## 2023-05-07 NOTE — Patient Instructions (Signed)
 D/C from [facility] on warfarin 5mg  daily on 2/14 On Amiodarone 400mg  twice daily x 5 days, 200mg  twice daily x 14 days, then 200mg  daily Decrease warfarin to 5mg  daily except 2.5mg  on Mondays, Wednesdays and Fridays Recheck in 1 wk

## 2023-05-07 NOTE — Anesthesia Postprocedure Evaluation (Signed)
 Anesthesia Post Note  Patient: Chase Lee  Procedure(s) Performed: REDO STERNOTOMY BENTALL PROCEDURE USING ON-X ASCENDING AORTIC PROSTHESIS SIZE (Chest) REPLACEMENT ASCENDING AORTIC ANEURYSM USING HEMASHIELD PLATINUM GRAFT SIZE TRANSESOPHAGEAL ECHOCARDIOGRAM (TEE)     Patient location during evaluation: ICU Anesthesia Type: General Level of consciousness: sedated and patient remains intubated per anesthesia plan Pain management: pain level controlled Vital Signs Assessment: post-procedure vital signs reviewed and stable Respiratory status: patient remains intubated per anesthesia plan Cardiovascular status: stable Postop Assessment: no apparent nausea or vomiting Anesthetic complications: no   No notable events documented.  Last Vitals:  Vitals:   05/04/23 0834 05/04/23 0840  BP: 132/87   Pulse: 91   Resp:  17  Temp:    SpO2:      Last Pain:  Vitals:   05/04/23 0927  TempSrc:   PainSc: Asleep                 Debby FORBES Like

## 2023-05-07 NOTE — Telephone Encounter (Signed)
 Patient called stating he was shopping today and his right shoulder began to hurt. States pain is at his shoulder incision. Denies any issues with incision itself. Requesting pain med refill. RX for oxy sent in for 10mg  and to split in half for 5mg  tablets Q4hrs. Patient admits he has been taking the full 10mg  tablet. States splitting the pill slipped his mind despite stating he was told to split. Discussed with Gaynelle Arabian, PA. Advised patient d/t pharmacy regulations a prescription can not be sent in at this time. Advised patient PA would be happy to refill on Thursday. Advised patient to begin splitting pills as directed and alternating oxycodone with Tylenol. Patient verbalized understanding.

## 2023-05-08 ENCOUNTER — Other Ambulatory Visit: Payer: Self-pay

## 2023-05-08 DIAGNOSIS — F411 Generalized anxiety disorder: Secondary | ICD-10-CM

## 2023-05-08 DIAGNOSIS — F410 Panic disorder [episodic paroxysmal anxiety] without agoraphobia: Secondary | ICD-10-CM

## 2023-05-08 MED ORDER — ALPRAZOLAM 1 MG PO TABS: ORAL_TABLET | ORAL | 0 refills | Status: DC

## 2023-05-08 NOTE — Telephone Encounter (Signed)
 Pended xanax to [facility].

## 2023-05-08 NOTE — Telephone Encounter (Signed)
 RF on Xanax. Made appt 05/16/22 for telehealth. Is requesting Xanax to be sent if it's due soon.

## 2023-05-09 ENCOUNTER — Ambulatory Visit: Payer: Self-pay | Admitting: *Deleted

## 2023-05-09 DIAGNOSIS — Z4802 Encounter for removal of sutures: Secondary | ICD-10-CM

## 2023-05-09 NOTE — Progress Notes (Signed)
 Patient arrived for nurse visit to remove sutures post-redo sternotomy for bentall 2/6.  Four sutures removed with no signs or symptoms of infection noted.  Incisions well approximated. Patient tolerated suture removal well.  Patient and family instructed to keep the incision site clean and dry. Patient and family acknowledged instructions given.  All questions answered.

## 2023-05-10 ENCOUNTER — Other Ambulatory Visit: Payer: Self-pay | Admitting: Physician Assistant

## 2023-05-10 MED ORDER — OXYCODONE HCL 10 MG PO TABS: 5.0000 mg | ORAL_TABLET | ORAL | 0 refills | Status: AC | PRN

## 2023-05-10 NOTE — Progress Notes (Signed)
 Re-ordered Oxycodone 10mg   Sig: 1/2 tablet q4h prn pain. #20 tabs.   The 10mg  tablets are ordered because the patient said his pharmacy did not have the 5mg  tablets on hand.   Gaynelle Arabian, PA-C

## 2023-05-14 ENCOUNTER — Ambulatory Visit: Payer: 59 | Attending: Internal Medicine | Admitting: *Deleted

## 2023-05-14 DIAGNOSIS — Z5181 Encounter for therapeutic drug level monitoring: Secondary | ICD-10-CM

## 2023-05-14 DIAGNOSIS — I480 Paroxysmal atrial fibrillation: Secondary | ICD-10-CM

## 2023-05-14 LAB — POCT INR: INR: 3.3 — AB (ref 2.0–3.0)

## 2023-05-14 NOTE — Patient Instructions (Signed)
 D/C from [facility] on warfarin 5mg  daily on 2/14 On Amiodarone 200mg  twice daily x 14 days, then 200mg  daily Hold warfarin tonight then resume 5mg  daily except 2.5mg  on Mondays, Wednesdays and Fridays Recheck in 1 wk

## 2023-05-15 ENCOUNTER — Encounter: Payer: Self-pay | Admitting: Internal Medicine

## 2023-05-15 ENCOUNTER — Telehealth: Payer: Self-pay | Admitting: Internal Medicine

## 2023-05-15 ENCOUNTER — Other Ambulatory Visit: Payer: Self-pay | Admitting: Internal Medicine

## 2023-05-15 NOTE — Telephone Encounter (Signed)
 Reports he checks his BP that shows his HR with home BP monitor. Reports HR yesterday was in the 90's. Reports he checked his BP after speaking with [place] and it was 102/77 & HR 78. Reports he was SOB & had palpitations that started yesterday. Reports he was dizzy today while sitting & looking down at phone. Reports he rested and dizziness stopped after 5-10 minutes. Reports having chest pain last night and today rated 3/10. Reports after taking a gas pill today, his chest pain stopped. Reports active chest soreness that he describes as tightness around surgical site rated 4-5/10. Denies SOB. Reports he has not been sleeping well since hospital discharge. Medications reviewed. Currently takes amiodarone 200 mg BID, Lopressor 25 mg BID and lisinopril 20 mg every day. Offered nurse visit today in [place] but says he doesn't have transportation. Blood pressure and HR checked while on phone and now is BP 94/76 HR 104 & 89/69 HR 99. Arranged nurse visit in the morning at 8:20 am to have EKG and vitals taken. Advised to break his lisinopril in half daily. Advised if symptoms get worse, to go to the ED for an evaluation. Verbalized understanding of plan.

## 2023-05-15 NOTE — Telephone Encounter (Signed)
 STAT if HR is under 50 or over 120 (normal HR is 60-100 beats per minute)  What is your heart rate?          BP 107/77  HR 102  Do you have a log of your heart rate readings (document readings)?   No  Do you have any other symptoms?  A little bit of dizziness while sitting for about 20-30 seconds  Patient wants to know if he should take an additional dosage of his Amiodarone medication.  Patient noted he had not been sleeping well.

## 2023-05-15 NOTE — Telephone Encounter (Signed)
 Patient is concerned about his HR being elevated today it has been ranging from 102-104 consistently for the past 2 days  Patient reduced his amiodarone down to 200 Mg BID on 05/10/23 Patient states he has only been able to sleep about 4.5 hours due to the HR/palpitations . Patient is curious if he can take an extra amiodarone or half to bring his HR down some. Patient feels he is out of rhythm he has had 1 dizzy spell that last 30 seconds while sitting. Patient states he Is concerned with this due to he has a aortic valve has been replaced  Are you experiencing any other symptoms? No  Current BP-100/81 Current HR-112 Advised patient if symptoms continue or worsen before hearing back to report to ED.

## 2023-05-16 ENCOUNTER — Encounter: Payer: Self-pay | Admitting: *Deleted

## 2023-05-16 ENCOUNTER — Ambulatory Visit: Payer: 59 | Attending: Cardiology | Admitting: *Deleted

## 2023-05-16 ENCOUNTER — Other Ambulatory Visit: Payer: Self-pay | Admitting: Internal Medicine

## 2023-05-16 ENCOUNTER — Encounter: Payer: No Typology Code available for payment source | Admitting: Surgery

## 2023-05-16 ENCOUNTER — Other Ambulatory Visit (INDEPENDENT_AMBULATORY_CARE_PROVIDER_SITE_OTHER): Payer: No Typology Code available for payment source

## 2023-05-16 VITALS — BP 104/60 | HR 84 | Ht 67.0 in | Wt 221.6 lb

## 2023-05-16 DIAGNOSIS — I447 Left bundle-branch block, unspecified: Secondary | ICD-10-CM

## 2023-05-16 DIAGNOSIS — I480 Paroxysmal atrial fibrillation: Secondary | ICD-10-CM

## 2023-05-16 MED ORDER — LISINOPRIL 10 MG PO TABS: 10.0000 mg | ORAL_TABLET | Freq: Every day | ORAL | 3 refills | Status: DC

## 2023-05-16 NOTE — Progress Notes (Signed)
 Mallipeddi, Orion Modest, MD  Eustace Moore, RN; Antoine Poche, MD EKG shows Afib and LBBB. Continue current metoprolol, amio and warfarin doses. Decrease lisinopril from 20 to 10 mg once daily.  Obtain 2 week event monitor to assess rate control.  Patient informed and verbalized understanding of plan.

## 2023-05-16 NOTE — Telephone Encounter (Signed)
 Nurse visit arranged for EKG and vitals.

## 2023-05-16 NOTE — Progress Notes (Signed)
 Presents to office for nurse visit to check EKG and vitals per recent phone note. Medications reviewed. Reports taking all medications as prescribed without side effects. Currently taking amiodarone 200 mg twice daily for 14 days that he started on 05/10/2023.  Confirmed that he takes metoprolol tartrate 25 mg BID. Per phone call yesterday, he was advised to break his lisinopril in half due to low blood pressures and complaint of dizziness. Reports he didn't remember and took lisinopril 20 mg this morning instead of 10 mg. Denies dizziness, chest pain or SOB today. Reports feeling palpitations. EKG and vitals done and routed to DOD for review.

## 2023-05-16 NOTE — Patient Instructions (Addendum)
 Medication Instructions:  Your physician has recommended you make the following change in your medication:  Decrease lisinopril to 10 mg daily Continue all other medications as prescribed.  Labwork: none  Testing/Procedures: 14 DAY ZIO AT This monitor will be mailed to your home address by [employer].  Follow-Up: Your physician recommends that you schedule a follow-up appointment in: as planned  Any Other Special Instructions Will Be Listed Below (If Applicable).  If you need a refill on your cardiac medications before your next appointment, please call your pharmacy.

## 2023-05-16 NOTE — Telephone Encounter (Signed)
 Patient informed and verbalized understanding of plan.

## 2023-05-17 ENCOUNTER — Telehealth (INDEPENDENT_AMBULATORY_CARE_PROVIDER_SITE_OTHER): Payer: No Typology Code available for payment source | Admitting: Adult Health

## 2023-05-17 ENCOUNTER — Encounter: Payer: Self-pay | Admitting: Adult Health

## 2023-05-17 DIAGNOSIS — F411 Generalized anxiety disorder: Secondary | ICD-10-CM | POA: Diagnosis not present

## 2023-05-17 DIAGNOSIS — F410 Panic disorder [episodic paroxysmal anxiety] without agoraphobia: Secondary | ICD-10-CM | POA: Diagnosis not present

## 2023-05-17 DIAGNOSIS — F331 Major depressive disorder, recurrent, moderate: Secondary | ICD-10-CM

## 2023-05-17 NOTE — Progress Notes (Signed)
 Chase Lee [id number] 1981/02/22 43 y.o.  Virtual Visit via Video Note  I connected with pt @ on 05/17/23 at  1:30 PM EST by a video enabled telemedicine application and verified that I am speaking with the correct person using two identifiers.   I discussed the limitations of evaluation and management by telemedicine and the availability of in person appointments. The patient expressed understanding and agreed to proceed.  I discussed the assessment and treatment plan with the patient. The patient was provided an opportunity to ask questions and all were answered. The patient agreed with the plan and demonstrated an understanding of the instructions.   The patient was advised to call back or seek an in-person evaluation if the symptoms worsen or if the condition fails to improve as anticipated.  I provided 20 minutes of non-face-to-face time during this encounter.  The patient was located at home.  The provider was located at [facility].   Dorothyann Gibbs, NP   Subjective:   Patient ID:  Chase Lee is a 43 y.o. (DOB 03/31/1980) male.  Chief Complaint: No chief complaint on file.   HPI Waymon Laser presents for follow-up of MDD, panic attacks and GAD.   Describes mood today as "ok". Pleasant. Reports  tearfulness. Mood symptoms - reports some situational anxiety. Denies depression. Reports decreased irritability - related to hospital stay. Reports varying interest and motivation. Reports panic attacks - "situational". Reports some worry, rumination and over thinking. Denies obsessional thoughts or acts. Reports recent hospitalization - recovering. Mood has improved. Stating "I feel like I'm doing better". Feels like medications are helpful.  Taking medications as prescribed.  Energy levels lower. Active, does not have a regular exercise routine with recent surgery. Enjoys some usual interests and activities. Married. Lives with wife. He and wife  doing well.  Family in [place]. Spending time with family. Appetite adequate. Reports weight loss of 15 to 19 pounds while hospitalized - down to 219 pounds. Reports sleeping difficulties. Averages 4 to 5 hours. Reports difficulties with focus and concentration at times. Completing tasks. Managing aspects of household. Reports being out of work currently but plans to work a different job for now. Previously worked Advice worker.  Denies SI or HI.  Denies AH or VH. Denies self harm. Denies substance use.   Previous medications: Prozac, Zoloft  Review of Systems:  Review of Systems  Musculoskeletal:  Negative for gait problem.  Neurological:  Negative for tremors.  Psychiatric/Behavioral:         Please refer to HPI    Medications: I have reviewed the patient's current medications.  Current Outpatient Medications  Medication Sig Dispense Refill   ALPRAZolam (XANAX) 1 MG tablet TAKE 1 TABLET(1 MG) BY MOUTH THREE TIMES DAILY AS NEEDED FOR ANXIETY 90 tablet 0   amiodarone (PACERONE) 200 MG tablet Take 2 tablets (400 mg total) by mouth 2 (two) times daily. For 5 days then decrease the dose to 1 tablet (200mg ) TWICE daily for 14 days then decrease the dose to 1 tablet (200mg ) ONCE daily 60 tablet 1   aspirin EC 81 MG tablet Take 1 tablet (81 mg total) by mouth daily. Swallow whole. 30 tablet 12   Compression Bandages KIT 1 each by Does not apply route as needed (swelling). Adult compression stocking 15-20 adult size medium-large 1 kit 0   escitalopram (LEXAPRO) 10 MG tablet TAKE 1 TABLET(10 MG) BY MOUTH DAILY 30 tablet 0   lisinopril (ZESTRIL) 10 MG tablet Take  1 tablet (10 mg total) by mouth daily. 90 tablet 3   metoprolol tartrate (LOPRESSOR) 25 MG tablet Take 1 tablet (25 mg total) by mouth 2 (two) times daily. 30 tablet 5   omeprazole (PRILOSEC) 40 MG capsule Take 1 capsule (40 mg total) by mouth daily. 60 capsule 0   Oxycodone HCl 10 MG TABS Take 0.5 tablets (5 mg total) by mouth every 4  (four) hours as needed for up to 7 days. 20 tablet 0   warfarin (COUMADIN) 5 MG tablet Take 1 tablet (5 mg total) by mouth daily. 30 tablet 5   No current facility-administered medications for this visit.    Medication Side Effects: None  Allergies: No Known Allergies  Past Medical History:  Diagnosis Date   Anxiety    History of artificial heart valve 2008   surgery at 25   HTN (hypertension) 04/05/2023    Family History  Problem Relation Age of Onset   Depression Mother    Cancer Mother    Cancer Father     Social History   Socioeconomic History   Marital status: Married    Spouse name: Not on file   Number of children: 0   Years of education: Not on file   Highest education level: Not on file  Occupational History   Occupation: Investment banker, corporate: [employer]  Tobacco Use   Smoking status: Former    Current packs/day: 0.50    Average packs/day: 0.5 packs/day for 0.1 years (0.1 ttl pk-yrs)    Types: Cigarettes    Start date: 04/06/2023    Quit date: 08/10/2019    Passive exposure: Never   Smokeless tobacco: Never   Tobacco comments:    Tries to chew nicotine gum at work - smoking 3 ciggs per day   Vaping Use   Vaping status: Never Used  Substance and Sexual Activity   Alcohol use: Never   Drug use: Never   Sexual activity: Yes    Birth control/protection: None  Other Topics Concern   Not on file  Social History Narrative   Not on file   Social Drivers of Health   Financial Resource Strain: Not on file  Food Insecurity: No Food Insecurity (04/20/2023)   Hunger Vital Sign    Worried About Running Out of Food in the Last Year: Never true    Ran Out of Food in the Last Year: Never true  Transportation Needs: No Transportation Needs (04/20/2023)   PRAPARE - Administrator, Civil Service (Medical): No    Lack of Transportation (Non-Medical): No  Physical Activity: Not on file  Stress: Not on file  Social Connections: Socially Integrated  (04/20/2023)   Social Connection and Isolation Panel [NHANES]    Frequency of Communication with Friends and Family: More than three times a week    Frequency of Social Gatherings with Friends and Family: More than three times a week    Attends Religious Services: 1 to 4 times per year    Active Member of [employer] or Organizations: Yes    Attends Banker Meetings: 1 to 4 times per year    Marital Status: Married  Catering manager Violence: Not At Risk (04/20/2023)   Humiliation, Afraid, Rape, and Kick questionnaire    Fear of Current or Ex-Partner: No    Emotionally Abused: No    Physically Abused: No    Sexually Abused: No    Past Medical History, Surgical history, Social history, and  Family history were reviewed and updated as appropriate.   Please see review of systems for further details on the patient's review from today.   Objective:   Physical Exam:  There were no vitals taken for this visit.  Physical Exam Constitutional:      General: He is not in acute distress. Musculoskeletal:        General: No deformity.  Neurological:     Mental Status: He is alert and oriented to person, place, and time.     Coordination: Coordination normal.  Psychiatric:        Attention and Perception: Attention and perception normal. He does not perceive auditory or visual hallucinations.        Mood and Affect: Affect is not labile, blunt, angry or inappropriate.        Speech: Speech normal.        Behavior: Behavior normal.        Thought Content: Thought content normal. Thought content is not paranoid or delusional. Thought content does not include homicidal or suicidal ideation. Thought content does not include homicidal or suicidal plan.        Cognition and Memory: Cognition and memory normal.        Judgment: Judgment normal.     Comments: Insight intact     Lab Review:     Component Value Date/Time   NA 131 (L) 05/04/2023 0342   K 4.1 05/04/2023 0342   CL 95 (L)  05/04/2023 0342   CO2 25 05/04/2023 0342   GLUCOSE 153 (H) 05/04/2023 0342   BUN 12 05/04/2023 0342   CREATININE 1.01 05/04/2023 0342   CALCIUM 9.0 05/04/2023 0342   PROT 6.4 (L) 04/20/2023 1635   ALBUMIN 3.9 04/20/2023 1635   AST 30 04/20/2023 1635   ALT 49 (H) 04/20/2023 1635   ALKPHOS 75 04/20/2023 1635   BILITOT 0.9 04/20/2023 1635   GFRNONAA >60 05/04/2023 0342   GFRAA >60 08/12/2019 0734       Component Value Date/Time   WBC 14.9 (H) 05/04/2023 0342   RBC 3.70 (L) 05/04/2023 0342   HGB 11.0 (L) 05/04/2023 0342   HCT 33.0 (L) 05/04/2023 0342   PLT 490 (H) 05/04/2023 0342   MCV 89.2 05/04/2023 0342   MCH 29.7 05/04/2023 0342   MCHC 33.3 05/04/2023 0342   RDW 14.1 05/04/2023 0342   LYMPHSABS 3.3 05/04/2023 0342   MONOABS 1.7 (H) 05/04/2023 0342   EOSABS 0.3 05/04/2023 0342   BASOSABS 0.1 05/04/2023 0342    No results found for: "POCLITH", "LITHIUM"   No results found for: "PHENYTOIN", "PHENOBARB", "VALPROATE", "CBMZ"   .res Assessment: Plan:    Plan:  Lexapro 10mg  at hs Xanax 1mg  TID  RTC 3 months  25 minutes spent dedicated to the care of this patient on the date of this encounter to include pre-visit review of records, ordering of medication, post visit documentation, and face-to-face time with the patient discussing MDD, panic attacks and GAD. Discussed continuing current medication regimen.  Patient advised to contact office with any questions, adverse effects, or acute worsening in signs and symptoms.  Discussed potential benefits, risk, and side effects of benzodiazepines to include potential risk of tolerance and dependence, as well as possible drowsiness.  Advised patient not to drive if experiencing drowsiness and to take lowest possible effective dose to minimize risk of dependence and tolerance.  There are no diagnoses linked to this encounter.   Please see After Visit Summary for patient specific instructions.  Future Appointments  Date Time  Provider Department Center  05/17/2023  1:30 PM Aziah Kaiser, Thereasa Solo, NP [facility] None  05/21/2023 11:45 AM [facility]  05/21/2023  3:30 PM Bartle, Payton Doughty, MD [facility]  05/31/2023  3:30 PM [place] Ouch, Lennart Pall, PA-C [facility]  06/07/2023 10:30 AM [facility] - [facility] 1 OUTPATIENT [facility]  07/09/2023  9:20 AM Mallipeddi, Vishnu P, MD [facility]    No orders of the defined types were placed in this encounter.     -------------------------------

## 2023-05-21 ENCOUNTER — Ambulatory Visit (INDEPENDENT_AMBULATORY_CARE_PROVIDER_SITE_OTHER): Payer: Self-pay | Admitting: Surgery

## 2023-05-21 ENCOUNTER — Ambulatory Visit: Payer: 59 | Attending: Internal Medicine | Admitting: *Deleted

## 2023-05-21 ENCOUNTER — Other Ambulatory Visit: Payer: Self-pay | Admitting: Surgery

## 2023-05-21 ENCOUNTER — Telehealth: Payer: Self-pay | Admitting: Internal Medicine

## 2023-05-21 ENCOUNTER — Encounter: Payer: Self-pay | Admitting: Surgery

## 2023-05-21 ENCOUNTER — Ambulatory Visit
Admission: RE | Admit: 2023-05-21 | Discharge: 2023-05-21 | Disposition: A | Discharge status: Home / Self Care | Source: Ambulatory Visit | Attending: Surgery | Admitting: Surgery

## 2023-05-21 VITALS — BP 123/74 | HR 65 | Resp 18 | Ht 67.0 in | Wt 228.0 lb

## 2023-05-21 DIAGNOSIS — Z5181 Encounter for therapeutic drug level monitoring: Secondary | ICD-10-CM

## 2023-05-21 DIAGNOSIS — I480 Paroxysmal atrial fibrillation: Secondary | ICD-10-CM | POA: Diagnosis not present

## 2023-05-21 DIAGNOSIS — I7121 Aneurysm of the ascending aorta, without rupture: Secondary | ICD-10-CM

## 2023-05-21 DIAGNOSIS — Z952 Presence of prosthetic heart valve: Secondary | ICD-10-CM

## 2023-05-21 DIAGNOSIS — Z9889 Other specified postprocedural states: Secondary | ICD-10-CM

## 2023-05-21 DIAGNOSIS — Z8679 Personal history of other diseases of the circulatory system: Secondary | ICD-10-CM

## 2023-05-21 LAB — POCT INR: INR: 4.2 — AB (ref 2.0–3.0)

## 2023-05-21 MED ORDER — OXYCODONE HCL 5 MG PO CAPS: 5.0000 mg | ORAL_CAPSULE | Freq: Four times a day (QID) | ORAL | 0 refills | Status: DC | PRN

## 2023-05-21 NOTE — Progress Notes (Signed)
 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 The patient's early postoperative recovery while in the hospital was notable for an uncomplicated postoperative course.  It took a few extra days to get him therapeutic on Coumadin and he was discharged with an INR of 1.8 on 5 mg daily.  His INR today was up to 4.2.  His last 1 on 05/14/2023 was 3.3.  His Coumadin dose was adjusted downwards per pharmacy today.  Since hospital discharge the patient reports that he has been feeling well.  He is walking at least 20 minutes a few times per day without chest pain or shortness of breath..   Current Outpatient Medications  Medication Sig Dispense Refill   ALPRAZolam (XANAX) 1 MG tablet TAKE 1 TABLET(1 MG) BY MOUTH THREE TIMES DAILY AS NEEDED FOR ANXIETY 90 tablet 0   amiodarone (PACERONE) 200 MG tablet Take 2 tablets (400 mg total) by mouth 2 (two) times daily. For 5 days then decrease the dose to 1 tablet (200mg ) TWICE daily for 14 days then decrease the dose to 1 tablet (200mg ) ONCE daily 60 tablet 1   aspirin EC 81 MG tablet Take 1 tablet (81 mg total) by mouth daily. Swallow whole. 30 tablet 12   Compression Bandages KIT 1 each by Does not apply route as needed (swelling). Adult compression stocking 15-20 adult size medium-large 1 kit 0   escitalopram (LEXAPRO) 10 MG tablet TAKE 1 TABLET(10 MG) BY MOUTH DAILY 30 tablet 0   lisinopril (ZESTRIL) 10 MG tablet Take 1 tablet (10 mg total) by mouth daily. 90 tablet 3   metoprolol tartrate (LOPRESSOR) 25 MG tablet Take 1 tablet (25 mg total) by mouth 2 (two)  times daily. 30 tablet 5   omeprazole (PRILOSEC) 40 MG capsule Take 1 capsule (40 mg total) by mouth daily. 60 capsule 0   oxycodone (OXY-IR) 5 MG capsule Take 1 capsule (5 mg total) by mouth every 6 (six) hours as needed for pain. 30 capsule 0   warfarin (COUMADIN) 5 MG tablet Take 1 tablet (5 mg total) by mouth daily. 30 tablet 5   No current facility-administered medications for this visit.    Physical Exam: BP 123/74 (BP Location: Left Arm)   Pulse 65   Resp 18   Ht 5\' 7"  (1.702 m)   Wt 228 lb (103.4 kg)   SpO2 96%   BMI 35.71 kg/m  He looks well. Cardiac exam shows a regular rate and rhythm with a crisp mechanical valve click.  There is no murmur. Lungs are clear. The chest incision is healing well and the sternum is stable.  There are small eschars on the chest tube sites. There is no peripheral edema.  Diagnostic Tests:  Chest x-ray today shows clear lung fields and no pleural effusions.  Impression:  Overall I think he is doing very well almost 1 month out from his surgery.  I encouraged him to continue increasing the distance that he is walking.  I told him he could return to driving a car but should refrain of lifting anything heavier than 10 pounds for 3 months postoperatively.  He is still having some chest wall discomfort and  I renewed his oxycodone 5 mg every 6 hours as needed for pain #30.  I told him that he should continue to wean the dose of oxycodone that he is using and take Tylenol in between as needed.  Plan:  I will plan to see him back in 2 months for follow-up.   Alleen Borne, MD [facility] [phone]

## 2023-05-21 NOTE — Telephone Encounter (Signed)
 Checking percert on the following   14 Day ZIO AT dx: atrial fibrillation and LBBB

## 2023-05-21 NOTE — Patient Instructions (Signed)
 D/C from [facility] on warfarin 5mg  daily on 2/14 On Amiodarone 200mg  twice daily x 14 days, then 200mg  daily Hold warfarin tonight then decrease dose to 1/2 tablet daily except 1 tablet on Sundays and Thursdays Recheck in 1 wk

## 2023-05-24 ENCOUNTER — Ambulatory Visit: Payer: 59 | Admitting: Surgery

## 2023-05-28 ENCOUNTER — Telehealth: Payer: Self-pay | Admitting: Internal Medicine

## 2023-05-28 NOTE — Telephone Encounter (Signed)
 Chase Lee with [employer] calling to report abnormal results

## 2023-05-28 NOTE — Telephone Encounter (Signed)
 Calling to report Abnormal EKG. Spoke with Constance Holster. Reported at 12:23 pm. Today AFIB 71-97 bpm with an average heart rate of 84. Information also available on [employer]. No further symptoms to report

## 2023-05-29 ENCOUNTER — Telehealth: Payer: Self-pay | Admitting: Internal Medicine

## 2023-05-29 ENCOUNTER — Ambulatory Visit: Attending: Internal Medicine | Admitting: *Deleted

## 2023-05-29 DIAGNOSIS — Z5181 Encounter for therapeutic drug level monitoring: Secondary | ICD-10-CM

## 2023-05-29 DIAGNOSIS — Z952 Presence of prosthetic heart valve: Secondary | ICD-10-CM

## 2023-05-29 LAB — POCT INR: INR: 3.4 — AB (ref 2.0–3.0)

## 2023-05-29 NOTE — Telephone Encounter (Signed)
 Pt came into [place] office and stated "My valve feels like it is going 1000 miles a minute." He stated it started off feeling like palpitations but has rapidly increased. He feels it does it more times than not.

## 2023-05-29 NOTE — Telephone Encounter (Signed)
 Called patient to check in on symptoms. He is still currently wearing monitor. Has been going into Afib the last 4 days. He can feel the double beating, then very rapidly. He has checked his BP 118/81 and HR 96. Reports no chest pain, SOB and dizziness at this time. Advised him will send to Dr. Jenene Slicker so she is aware. Also has an appointment with [place] on Thursday as well.

## 2023-05-29 NOTE — Patient Instructions (Signed)
 D/C from [facility] on warfarin 5mg  daily on 2/14 On Amiodarone 200mg  daily Hold warfarin tonight then decrease dose to 1/2 tablet daily except 1 tablet on Sundays Recheck in 1 wk

## 2023-05-30 MED ORDER — AMIODARONE HCL 200 MG PO TABS: 200.0000 mg | ORAL_TABLET | Freq: Two times a day (BID) | ORAL | Status: DC

## 2023-05-30 NOTE — Telephone Encounter (Signed)
 Per Dr. Jenene Slicker:  Is he taking Amiodarone 200 mg once daily? If yes, instruct to take amiodarone 200 mg BID.   Patient stated that he is now taking once daily. Advised him to increase to twice daily. Patient verbalized understanding.

## 2023-05-31 ENCOUNTER — Other Ambulatory Visit: Payer: Self-pay | Admitting: Adult Health

## 2023-05-31 ENCOUNTER — Ambulatory Visit: Payer: 59 | Attending: Student | Admitting: Student

## 2023-05-31 ENCOUNTER — Encounter: Payer: Self-pay | Admitting: Student

## 2023-05-31 VITALS — BP 120/72 | HR 58 | Ht 66.5 in | Wt 228.2 lb

## 2023-05-31 DIAGNOSIS — I447 Left bundle-branch block, unspecified: Secondary | ICD-10-CM

## 2023-05-31 DIAGNOSIS — I480 Paroxysmal atrial fibrillation: Secondary | ICD-10-CM

## 2023-05-31 DIAGNOSIS — I10 Essential (primary) hypertension: Secondary | ICD-10-CM | POA: Diagnosis not present

## 2023-05-31 DIAGNOSIS — Z952 Presence of prosthetic heart valve: Secondary | ICD-10-CM | POA: Diagnosis not present

## 2023-05-31 DIAGNOSIS — F411 Generalized anxiety disorder: Secondary | ICD-10-CM

## 2023-05-31 DIAGNOSIS — F410 Panic disorder [episodic paroxysmal anxiety] without agoraphobia: Secondary | ICD-10-CM

## 2023-05-31 DIAGNOSIS — F331 Major depressive disorder, recurrent, moderate: Secondary | ICD-10-CM

## 2023-05-31 MED ORDER — AMIODARONE HCL 200 MG PO TABS: ORAL_TABLET | ORAL | 0 refills | Status: DC

## 2023-05-31 NOTE — Progress Notes (Signed)
 Cardiology Office Note    Date:  05/31/2023  ID:  Chase Lee, DOB 22-Sep-1980, MRN [id number] Cardiologist: Marjo Bicker, MD    History of Present Illness:    Chase Lee is a 43 y.o. male with past medical history of bicuspid aortic valve (s/p aortic valvuloplasty at age 59 and subsequent mechanical AVR at 43 yo), HTN and palpitations who presents to the office today for hospital follow-up.  He was examined by Dr. Jenene Slicker in 03/2023 and recent CT imaging while in the ED had shown a 5.7 cm ascending aortic aneurysm. BP had previously been elevated but improved and he was continued on Lopressor 25 mg twice daily and Lisinopril 20 mg daily and referred to CT surgery. He had a follow-up echocardiogram on 04/18/2023 and this showed a preserved EF but he had severe dilatation of the aortic root measuring 62 mm and the ascending aorta measuring 77 mm. He was sent directly to the ED but they discharged him home and he did meet with CT Surgery the following day and reported having headaches and BP was significantly elevated at 199/107. He was admitted directly to [facility] with plans to stop Coumadin and bridge with Heparin. 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 These were reviewed by CT surgery and felt to be more chronic and he ultimately underwent Bentall procedure using a 25 mm On-X mechanical valve and replacement of the ascending aorta with a 32 mm Hemashield graft on 04/26/2023 by Dr. Laneta Simmers. He did have postoperative atrial fibrillation and was started on Amiodarone 200 mg twice daily with plans to reduce to 200 mg daily in 14 days. Was ultimately discharged on Amiodarone with taper dosing, ASA 81 mg daily, Lisinopril 20 mg daily, Lopressor 25 mg twice daily and Coumadin for anticoagulation.  He did contact the office later that month reporting his heart rate had been elevated at home and he did come in for  a nurse visit on 05/16/2023 which confirmed atrial fibrillation with LBBB. A 2-week monitor was recommended and is still pending. He did walk into the office on 05/29/2023 reporting worsening palpitations and heart rate in the 90's. Dr. Jenene Slicker recommended increasing his Amiodarone from 200 mg daily to 200 mg twice daily and awaiting his monitor results.  In talking with the patient today, he reports his breathing has significantly improved since surgery. He did experience palpitations as discussed above but these have improved since Amiodarone was adjusted to twice daily dosing. Still having some incisional chest wall pain and is trying to wean down how frequently he is using Oxycodone. Denies any specific orthopnea, PND or pitting edema. No reports of active bleeding. He is planning to participate in cardiac rehab and is scheduled for orientation next week. Plans to return to work in early April.  Studies Reviewed:   EKG: EKG is not ordered today.   Coronary CTA: 04/2023 IMPRESSION: 1. 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 There is also an ascending aorta dissection, appears to be a separate entry point at mid ascending aorta along greater curvature, proximal and distal extent both appear in the mid ascending aorta. Critical results reported to Dr. Rodolph Bong and acknowledged at 14:51 04/23/23.   2. Severely dilated ascending aortic aneurysm,  74 mm in maximum visualized dimension at the mid ascending aorta. Dilated aortic root with effacement of ST junction.   3. 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 Distal vessels are small caliber and not well assessed.   4. Total plaque volume 16 mm3 which is 51st percentile for age- and sex-matched controls (calcified plaque 1 mm3; non-calcified  plaque 15 mm3). TPV is mild.   5. Coronary calcium score of 0.   6. Normal coronary origins with right dominance.   7. Mechanical aortic valve noted, grossly well seated with no apparent dehiscence.    Risk Assessment/Calculations:    CHA2DS2-VASc Score = 2   This indicates a 2.2% annual risk of stroke. The patient's score is based upon: CHF History: 0 HTN History: 1 Diabetes History: 0 Stroke History: 0 Vascular Disease History: 1 Age Score: 0 Gender Score: 0              Physical Exam:   VS:  BP 120/72   Pulse (!) 58   Ht 5' 6.5" (1.689 m)   Wt 228 lb 3.2 oz (103.5 kg)   SpO2 96%   BMI 36.28 kg/m    Wt Readings from Last 3 Encounters:  05/31/23 228 lb 3.2 oz (103.5 kg)  05/21/23 228 lb (103.4 kg)  05/16/23 221 lb 9.6 oz (100.5 kg)     GEN: Well nourished, well developed male appearing in no acute distress NECK: No JVD; No carotid bruits CARDIAC: RRR, mechanical valve sounds noted. Sternal incision appears to be healing well with no erythema or drainage.  RESPIRATORY:  Clear to auscultation without rales, wheezing or rhonchi  ABDOMEN: Appears non-distended. No obvious abdominal masses. EXTREMITIES: No clubbing or cyanosis. No pitting edema.  Distal pedal pulses are 2+ bilaterally.   Assessment and Plan:   1. History of Bicuspid Aortic Valve - He underwent aortic valvuloplasty at age 67 and subsequent mechanical AVR at 43 yo. Recently found to have an ascending aortic aneurysm at 77 mm and underwent Bentall procedure using a 25 mm On-X mechanical valve and replacement of the ascending aorta with a 32 mm Hemashield graft on 04/26/2023 by Dr. Laneta Simmers. He is overall recovering well and plans to return to work in early April. Will review this timeframe with Dr. Laneta Simmers (works as an Insurance claims handler). He is cleared to start Cardiac Rehab from our perspective.  - INR followed by our Coumadin Clinic and at 3.4 when checked on 05/29/2023. He is scheduled for a  follow-up echocardiogram on 06/07/2023.  2. Post-operative Atrial Fibrillation - He did have recent atrial fibrillation at the time of his nurse visit and currently has a live ZIO monitor in place. He has been taking Amiodarone 200 mg twice daily and was encouraged to continue this for 2 weeks and then reduce back to 200 mg once daily. Would anticipate a short course of less than 3 months and if he continues to have breakthrough episodes afterwards, would refer to EP for consideration of alternative antiarrhythmic options as Amiodarone is not ideal long-term given his age. - Continue Lopressor 25 mg twice daily. Did not further titrate today given his baseline heart rate in the 50's. - No reports of active bleeding. Remains on Coumadin for anticoagulation.  3. LBBB - Noted on tracings during recent admission. Monitor in place. Coronary CTA in 04/2023 showed mild CAD.   4. HTN - BP is well-controlled at 120/72 during today's visit. Continue current medical therapy with Lisinopril 10 mg  daily and Lopressor 25 mg twice daily.  Disposition: Plan for follow-up echo next week. He has previously scheduled follow-up with Dr. Jenene Slicker in 5-6 weeks and will keep that for now. If his monitor is reassuring and palpitations have improved, we reviewed he can call and reschedule this for 3-4 months out.   Signed, Ellsworth Lennox, PA-C

## 2023-05-31 NOTE — Patient Instructions (Signed)
 Medication Instructions:  Take Amiodarone 200 mg Two Times Daily for 2 weeks then reduce to 200 mg daily.   *If you need a refill on your cardiac medications before your next appointment, please call your pharmacy*   Lab Work: NONE   If you have labs (blood work) drawn today and your tests are completely normal, you will receive your results only by: MyChart Message (if you have MyChart) OR A paper copy in the mail If you have any lab test that is abnormal or we need to change your treatment, we will call you to review the results.   Testing/Procedures: NONE    Follow-Up: At [facility], you and your health needs are our priority.  As part of our continuing mission to provide you with exceptional heart care, we have created designated Provider Care Teams.  These Care Teams include your primary Cardiologist (physician) and Advanced Practice Providers (APPs -  Physician Assistants and Nurse Practitioners) who all work together to provide you with the care you need, when you need it.  We recommend signing up for the patient portal called "MyChart".  Sign up information is provided on this After Visit Summary.  MyChart is used to connect with patients for Virtual Visits (Telemedicine).  Patients are able to view lab/test results, encounter notes, upcoming appointments, etc.  Non-urgent messages can be sent to your provider as well.   To learn more about what you can do with MyChart, go to [email or web address].    Your next appointment:    April   Provider:   Luane School, MD    Other Instructions Thank you for choosing [facility]!

## 2023-06-01 ENCOUNTER — Other Ambulatory Visit: Payer: Self-pay | Admitting: Adult Health

## 2023-06-01 DIAGNOSIS — F410 Panic disorder [episodic paroxysmal anxiety] without agoraphobia: Secondary | ICD-10-CM

## 2023-06-01 DIAGNOSIS — F411 Generalized anxiety disorder: Secondary | ICD-10-CM

## 2023-06-04 ENCOUNTER — Encounter (HOSPITAL_COMMUNITY): Payer: Self-pay

## 2023-06-04 ENCOUNTER — Encounter (HOSPITAL_COMMUNITY)
Admission: RE | Admit: 2023-06-04 | Discharge: 2023-06-04 | Disposition: A | Discharge status: Home / Self Care | Source: Ambulatory Visit | Attending: Internal Medicine | Admitting: Internal Medicine

## 2023-06-04 DIAGNOSIS — I480 Paroxysmal atrial fibrillation: Secondary | ICD-10-CM | POA: Insufficient documentation

## 2023-06-04 DIAGNOSIS — Z5181 Encounter for therapeutic drug level monitoring: Secondary | ICD-10-CM | POA: Insufficient documentation

## 2023-06-04 DIAGNOSIS — I10 Essential (primary) hypertension: Secondary | ICD-10-CM | POA: Insufficient documentation

## 2023-06-04 DIAGNOSIS — Z952 Presence of prosthetic heart valve: Secondary | ICD-10-CM | POA: Insufficient documentation

## 2023-06-04 NOTE — Progress Notes (Signed)
 Completed virtual orientation today.  EP evaluation is scheduled for Wed 06/06/23 at 800 .  Documentation for diagnosis can be found in [facility] encounter 04/20/23 OV and admission.  Surgery was on 04/26/23.

## 2023-06-05 ENCOUNTER — Ambulatory Visit

## 2023-06-06 ENCOUNTER — Encounter (HOSPITAL_COMMUNITY)
Admission: RE | Admit: 2023-06-06 | Discharge: 2023-06-06 | Disposition: A | Discharge status: Home / Self Care | Source: Ambulatory Visit | Attending: Internal Medicine | Admitting: Internal Medicine

## 2023-06-06 ENCOUNTER — Encounter (INDEPENDENT_AMBULATORY_CARE_PROVIDER_SITE_OTHER)

## 2023-06-06 ENCOUNTER — Ambulatory Visit (INDEPENDENT_AMBULATORY_CARE_PROVIDER_SITE_OTHER)

## 2023-06-06 VITALS — Ht 67.5 in | Wt 230.8 lb

## 2023-06-06 VITALS — BP 162/84 | HR 58 | Ht 67.0 in | Wt 228.0 lb

## 2023-06-06 DIAGNOSIS — I10 Essential (primary) hypertension: Secondary | ICD-10-CM

## 2023-06-06 DIAGNOSIS — I480 Paroxysmal atrial fibrillation: Secondary | ICD-10-CM

## 2023-06-06 DIAGNOSIS — Z952 Presence of prosthetic heart valve: Secondary | ICD-10-CM

## 2023-06-06 DIAGNOSIS — Z5181 Encounter for therapeutic drug level monitoring: Secondary | ICD-10-CM

## 2023-06-06 LAB — POCT INR: INR: 2.6 (ref 2.0–3.0)

## 2023-06-06 NOTE — Patient Instructions (Signed)
 Patient Instructions  Patient Details  Name: Chase Lee MRN: [id number] Date of Birth: [birth year] Referring Provider:  Rodolph Bong, MD  Below are your personal goals for exercise, nutrition, and risk factors. Our goal is to help you stay on track towards obtaining and maintaining these goals. We will be discussing your progress on these goals with you throughout the program.  Initial Exercise Prescription:  Initial Exercise Prescription - 06/06/23 0900       Date of Initial Exercise RX and Referring Provider   Date 06/06/23    Referring Provider Luane School MD      Treadmill   MPH 2    Grade 0.5    Minutes 15    METs 2.67      Bike   Level 5    Minutes 15    METs 1.8      Prescription Details   Frequency (times per week) 3    Duration Progress to 30 minutes of continuous aerobic without signs/symptoms of physical distress      Intensity   THRR 40-80% of Max Heartrate 107-152    Ratings of Perceived Exertion 11-13    Perceived Dyspnea 0-4      Resistance Training   Training Prescription Yes    Weight 5    Reps 10-15             Exercise Goals: Frequency: Be able to perform aerobic exercise two to three times per week in program working toward 2-5 days per week of home exercise.  Intensity: Work with a perceived exertion of 11 (fairly light) - 15 (hard) while following your exercise prescription.  We will make changes to your prescription with you as you progress through the program.   Duration: Be able to do 30 to 45 minutes of continuous aerobic exercise in addition to a 5 minute warm-up and a 5 minute cool-down routine.   Nutrition Goals: Your personal nutrition goals will be established when you do your nutrition analysis with the dietician.  The following are general nutrition guidelines to follow: Cholesterol < 200mg /day Sodium < 1500mg /day Fiber: Men under 50 yrs - 38 grams per day  Personal Goals:  Personal Goals and Risk  Factors at Admission - 06/04/23 1422       Core Components/Risk Factors/Patient Goals on Admission    Weight Management Yes;Weight Loss;Obesity    Intervention Weight Management: Develop a combined nutrition and exercise program designed to reach desired caloric intake, while maintaining appropriate intake of nutrient and fiber, sodium and fats, and appropriate energy expenditure required for the weight goal.;Weight Management: Provide education and appropriate resources to help participant work on and attain dietary goals.;Weight Management/Obesity: Establish reasonable short term and long term weight goals.;Obesity: Provide education and appropriate resources to help participant work on and attain dietary goals.    Expected Outcomes Short Term: Continue to assess and modify interventions until short term weight is achieved;Long Term: Adherence to nutrition and physical activity/exercise program aimed toward attainment of established weight goal;Weight Loss: Understanding of general recommendations for a balanced deficit meal plan, which promotes 1-2 lb weight loss per week and includes a negative energy balance of [phone] kcal/d;Understanding recommendations for meals to include 15-35% energy as protein, 25-35% energy from fat, 35-60% energy from carbohydrates, less than 200mg  of dietary cholesterol, 20-35 gm of total fiber daily;Understanding of distribution of calorie intake throughout the day with the consumption of 4-5 meals/snacks    Tobacco Cessation Yes    Number of  packs per day 0    Intervention Offer self-teaching materials, assist with locating and accessing local/national Quit Smoking programs, and support quit date choice.;Assist the participant in steps to quit. Provide individualized education and counseling about committing to Tobacco Cessation, relapse prevention, and pharmacological support that can be provided by physician.    Expected Outcomes Long Term: Complete abstinence from all  tobacco products for at least 12 months from quit date.    Improve shortness of breath with ADL's Yes    Intervention Provide education, individualized exercise plan and daily activity instruction to help decrease symptoms of SOB with activities of daily living.    Expected Outcomes Short Term: Improve cardiorespiratory fitness to achieve a reduction of symptoms when performing ADLs;Long Term: Be able to perform more ADLs without symptoms or delay the onset of symptoms    Hypertension Yes    Intervention Provide education on lifestyle modifcations including regular physical activity/exercise, weight management, moderate sodium restriction and increased consumption of fresh fruit, vegetables, and low fat dairy, alcohol moderation, and smoking cessation.;Monitor prescription use compliance.    Expected Outcomes Short Term: Continued assessment and intervention until BP is < 140/43mm HG in hypertensive participants. < 130/2mm HG in hypertensive participants with diabetes, heart failure or chronic kidney disease.;Long Term: Maintenance of blood pressure at goal levels.             Tobacco Use Initial Evaluation: Social History   Tobacco Use  Smoking Status Former   Current packs/day: 0.50   Average packs/day: 0.5 packs/day for 0.2 years (0.1 ttl pk-yrs)   Types: Cigarettes   Start date: 04/06/2023   Quit date: 08/10/2019   Passive exposure: Never  Smokeless Tobacco Never  Tobacco Comments   Tries to chew nicotine gum at work - smoking 3 ciggs per day     Exercise Goals and Review:  Exercise Goals     Row Name 06/04/23 1410             Exercise Goals   Increase Physical Activity Yes       Intervention Provide advice, education, support and counseling about physical activity/exercise needs.;Develop an individualized exercise prescription for aerobic and resistive training based on initial evaluation findings, risk stratification, comorbidities and participant's personal goals.        Expected Outcomes Long Term: Exercising regularly at least 3-5 days a week.;Short Term: Attend rehab on a regular basis to increase amount of physical activity.;Long Term: Add in home exercise to make exercise part of routine and to increase amount of physical activity.       Increase Strength and Stamina Yes       Intervention Provide advice, education, support and counseling about physical activity/exercise needs.;Develop an individualized exercise prescription for aerobic and resistive training based on initial evaluation findings, risk stratification, comorbidities and participant's personal goals.       Expected Outcomes Short Term: Increase workloads from initial exercise prescription for resistance, speed, and METs.;Long Term: Improve cardiorespiratory fitness, muscular endurance and strength as measured by increased METs and functional capacity ( );Short Term: Perform resistance training exercises routinely during rehab and add in resistance training at home       Able to understand and use rate of perceived exertion (RPE) scale Yes       Intervention Provide education and explanation on how to use RPE scale       Expected Outcomes Short Term: Able to use RPE daily in rehab to express subjective intensity level;Long Term:  Able to  use RPE to guide intensity level when exercising independently       Able to understand and use Dyspnea scale Yes       Intervention Provide education and explanation on how to use Dyspnea scale       Expected Outcomes Short Term: Able to use Dyspnea scale daily in rehab to express subjective sense of shortness of breath during exertion;Long Term: Able to use Dyspnea scale to guide intensity level when exercising independently       Knowledge and understanding of Target Heart Rate Range (THRR) Yes       Intervention Provide education and explanation of THRR including how the numbers were predicted and where they are located for reference       Expected Outcomes  Short Term: Able to state/look up THRR;Long Term: Able to use THRR to govern intensity when exercising independently;Short Term: Able to use daily as guideline for intensity in rehab       Able to check pulse independently Yes       Intervention Provide education and demonstration on how to check pulse in carotid and radial arteries.;Review the importance of being able to check your own pulse for safety during independent exercise       Expected Outcomes Short Term: Able to explain why pulse checking is important during independent exercise;Long Term: Able to check pulse independently and accurately       Understanding of Exercise Prescription Yes       Intervention Provide education, explanation, and written materials on patient's individual exercise prescription       Expected Outcomes Short Term: Able to explain program exercise prescription;Long Term: Able to explain home exercise prescription to exercise independently                Copy of goals given to participant.

## 2023-06-06 NOTE — Patient Instructions (Signed)
 D/C from [facility] on warfarin 5mg  daily on 2/14 On Amiodarone 200mg  bid Continue warfarin 1/2 tablet daily except 1 tablet on Sundays  Recheck in 10 days

## 2023-06-06 NOTE — Progress Notes (Signed)
 Cardiac Individual Treatment Plan  Patient Details  Name: Chase Lee MRN: [id number] Date of Birth: 10/10/1980 Referring Provider:   Flowsheet Row CARDIAC REHAB PHASE II ORIENTATION from 06/06/2023 in [facility] CARDIAC REHABILITATION  Referring Provider Luane School MD       Initial Encounter Date:  Flowsheet Row CARDIAC REHAB PHASE II ORIENTATION from 06/06/2023 in [facility] CARDIAC REHABILITATION  Date 06/06/23       Visit Diagnosis: S/P AVR (aortic valve replacement)  Patient's Home Medications on Admission:  Current Outpatient Medications:    ALPRAZolam (XANAX) 1 MG tablet, TAKE 1 TABLET(1 MG) BY MOUTH THREE TIMES DAILY AS NEEDED FOR ANXIETY, Disp: 90 tablet, Rfl: 0   amiodarone (PACERONE) 200 MG tablet, Take 1 tablet (200 mg total) by mouth 2 (two) times daily for 14 days, THEN 1 tablet (200 mg total) daily., Disp: 88 tablet, Rfl: 0   aspirin EC 81 MG tablet, Take 1 tablet (81 mg total) by mouth daily. Swallow whole., Disp: 30 tablet, Rfl: 12   Compression Bandages KIT, 1 each by Does not apply route as needed (swelling). Adult compression stocking 15-20 adult size medium-large, Disp: 1 kit, Rfl: 0   escitalopram (LEXAPRO) 10 MG tablet, TAKE 1 TABLET(10 MG) BY MOUTH DAILY, Disp: 90 tablet, Rfl: 0   lisinopril (ZESTRIL) 10 MG tablet, Take 1 tablet (10 mg total) by mouth daily., Disp: 90 tablet, Rfl: 3   metoprolol tartrate (LOPRESSOR) 25 MG tablet, Take 1 tablet (25 mg total) by mouth 2 (two) times daily., Disp: 30 tablet, Rfl: 5   omeprazole (PRILOSEC) 40 MG capsule, Take 1 capsule (40 mg total) by mouth daily., Disp: 60 capsule, Rfl: 0   oxycodone (OXY-IR) 5 MG capsule, Take 1 capsule (5 mg total) by mouth every 6 (six) hours as needed for pain., Disp: 30 capsule, Rfl: 0   warfarin (COUMADIN) 5 MG tablet, Take 1 tablet (5 mg total) by mouth daily., Disp: 30 tablet, Rfl: 5  Past Medical History: Past Medical History:  Diagnosis Date   Anxiety    History of  artificial heart valve    a. s/p aortic valvuloplasty at age 53 b. subsequent mechanical AVR at 43 yo c. 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   HTN (hypertension) 04/05/2023    Tobacco Use: Social History   Tobacco Use  Smoking Status Former   Current packs/day: 0.50   Average packs/day: 0.5 packs/day for 0.2 years (0.1 ttl pk-yrs)   Types: Cigarettes   Start date: 04/06/2023   Quit date: 08/10/2019   Passive exposure: Never  Smokeless Tobacco Never  Tobacco Comments   Tries to chew nicotine gum at work - smoking 3 ciggs per day     Labs: Review Flowsheet       Latest Ref Rng & Units 04/26/2023 04/27/2023  Labs for ITP Cardiac and Pulmonary Rehab  Hemoglobin A1c 4.8 - 5.6 % - 6.5   PH, Arterial 7.35 - 7.45 7.334  7.330  7.350  7.319  7.349  7.391  7.418  7.275  7.391  7.343   PCO2 arterial 32 - 48 mmHg 33.2  43.7  40.4  48.7  41.8  39.9  34.7  54.9  36.9  39.1   Bicarbonate 20.0 - 28.0 mmol/L 17.7  23.1  22.4  25.4  23.0  24.2  22.4  25.5  22.9  22.4  21.3   TCO2 22 - 32 mmol/L 19  24  24  27  22  24  25  25  25  23  25  27  26  23  24  24  23  24  22    Acid-base deficit 0.0 - 2.0 mmol/L 7.0  3.0  3.0  1.0  2.0  1.0  2.0  2.0  3.0  2.0  4.0   O2 Saturation % 95  92  91  93  95  100  100  100  71  100  95     Details       Multiple values from one day are sorted in reverse-chronological order         Capillary Blood Glucose: Lab Results  Component Value Date   GLUCAP 106 (H) 04/29/2023   GLUCAP 129 (H) 04/28/2023   GLUCAP 122 (H) 04/28/2023   GLUCAP 171 (H) 04/28/2023   GLUCAP 168 (H) 04/28/2023     Exercise Target Goals: Exercise Program Goal: Individual exercise prescription set using results from initial 6 min walk test and THRR while considering  patient's activity barriers and safety.   Exercise Prescription Goal: Starting with aerobic activity 30 plus minutes a day, 3 days  per week for initial exercise prescription. Provide home exercise prescription and guidelines that participant acknowledges understanding prior to discharge.  Activity Barriers & Risk Stratification:  Activity Barriers & Cardiac Risk Stratification - 06/04/23 1409       Activity Barriers & Cardiac Risk Stratification   Activity Barriers Incisional Pain;Joint Problems   occassional stiff joints   Cardiac Risk Stratification High             6 Minute Walk:  6 Minute Walk     Row Name 06/06/23 0940         6 Minute Walk   Phase Initial     Distance 910 feet     Walk Time 6 minutes     # of Rest Breaks 0     MPH 1.72     METS 3.01     RPE 12     Perceived Dyspnea  1     VO2 Peak 10.54     Symptoms No     Resting HR 62 bpm     Resting BP 150/80     Resting Oxygen Saturation  97 %     Exercise Oxygen Saturation  during 6 min walk 97 %     Max Ex. HR 80 bpm     Max Ex. BP 146/78     2 Minute Post BP 132/78              Oxygen Initial Assessment:   Oxygen Re-Evaluation:   Oxygen Discharge (Final Oxygen Re-Evaluation):   Initial Exercise Prescription:  Initial Exercise Prescription - 06/06/23 0900       Date of Initial Exercise RX and Referring Provider   Date 06/06/23    Referring Provider Luane School MD      Treadmill   MPH 2    Grade 0.5    Minutes 15    METs 2.67      Bike   Level 5    Minutes 15    METs 1.8      Prescription Details   Frequency (times per week) 3    Duration Progress to 30 minutes of continuous aerobic without signs/symptoms of physical distress      Intensity   THRR 40-80% of Max Heartrate 107-152    Ratings  of Perceived Exertion 11-13    Perceived Dyspnea 0-4      Resistance Training   Training Prescription Yes    Weight 5    Reps 10-15             Perform Capillary Blood Glucose checks as needed.  Exercise Prescription Changes:   Exercise Comments:   Exercise Goals and Review:   Exercise  Goals     Row Name 06/04/23 1410             Exercise Goals   Increase Physical Activity Yes       Intervention Provide advice, education, support and counseling about physical activity/exercise needs.;Develop an individualized exercise prescription for aerobic and resistive training based on initial evaluation findings, risk stratification, comorbidities and participant's personal goals.       Expected Outcomes Long Term: Exercising regularly at least 3-5 days a week.;Short Term: Attend rehab on a regular basis to increase amount of physical activity.;Long Term: Add in home exercise to make exercise part of routine and to increase amount of physical activity.       Increase Strength and Stamina Yes       Intervention Provide advice, education, support and counseling about physical activity/exercise needs.;Develop an individualized exercise prescription for aerobic and resistive training based on initial evaluation findings, risk stratification, comorbidities and participant's personal goals.       Expected Outcomes Short Term: Increase workloads from initial exercise prescription for resistance, speed, and METs.;Long Term: Improve cardiorespiratory fitness, muscular endurance and strength as measured by increased METs and functional capacity ( );Short Term: Perform resistance training exercises routinely during rehab and add in resistance training at home       Able to understand and use rate of perceived exertion (RPE) scale Yes       Intervention Provide education and explanation on how to use RPE scale       Expected Outcomes Short Term: Able to use RPE daily in rehab to express subjective intensity level;Long Term:  Able to use RPE to guide intensity level when exercising independently       Able to understand and use Dyspnea scale Yes       Intervention Provide education and explanation on how to use Dyspnea scale       Expected Outcomes Short Term: Able to use Dyspnea scale daily in  rehab to express subjective sense of shortness of breath during exertion;Long Term: Able to use Dyspnea scale to guide intensity level when exercising independently       Knowledge and understanding of Target Heart Rate Range (THRR) Yes       Intervention Provide education and explanation of THRR including how the numbers were predicted and where they are located for reference       Expected Outcomes Short Term: Able to state/look up THRR;Long Term: Able to use THRR to govern intensity when exercising independently;Short Term: Able to use daily as guideline for intensity in rehab       Able to check pulse independently Yes       Intervention Provide education and demonstration on how to check pulse in carotid and radial arteries.;Review the importance of being able to check your own pulse for safety during independent exercise       Expected Outcomes Short Term: Able to explain why pulse checking is important during independent exercise;Long Term: Able to check pulse independently and accurately       Understanding of Exercise Prescription Yes  Intervention Provide education, explanation, and written materials on patient's individual exercise prescription       Expected Outcomes Short Term: Able to explain program exercise prescription;Long Term: Able to explain home exercise prescription to exercise independently                Exercise Goals Re-Evaluation :    Discharge Exercise Prescription (Final Exercise Prescription Changes):   Nutrition:  Target Goals: Understanding of nutrition guidelines, daily intake of sodium 1500mg , cholesterol 200mg , calories 30% from fat and 7% or less from saturated fats, daily to have 5 or more servings of fruits and vegetables.  Biometrics:  Pre Biometrics - 06/06/23 0944       Pre Biometrics   Height 5' 7.5" (1.715 m)    Weight 104.7 kg    Waist Circumference 42 inches    Hip Circumference 44 inches    Waist to Hip Ratio 0.95 %    BMI  (Calculated) 35.6    Grip Strength 44.5 kg    Single Leg Stand 10.14 seconds              Nutrition Therapy Plan and Nutrition Goals:  Nutrition Therapy & Goals - 06/04/23 1421       Intervention Plan   Intervention Prescribe, educate and counsel regarding individualized specific dietary modifications aiming towards targeted core components such as weight, hypertension, lipid management, diabetes, heart failure and other comorbidities.;Nutrition handout(s) given to patient.    Expected Outcomes Short Term Goal: Understand basic principles of dietary content, such as calories, fat, sodium, cholesterol and nutrients.;Long Term Goal: Adherence to prescribed nutrition plan.             Nutrition Assessments:  MEDIFICTS Score Key: >=70 Need to make dietary changes  40-70 Heart Healthy Diet <= 40 Therapeutic Level Cholesterol Diet  Flowsheet Row CARDIAC REHAB PHASE II ORIENTATION from 06/06/2023 in [facility] CARDIAC REHABILITATION  Picture Your Plate Total Score on Admission 58      Picture Your Plate Scores: <30 Unhealthy dietary pattern with much room for improvement. 41-50 Dietary pattern unlikely to meet recommendations for good health and room for improvement. 51-60 More healthful dietary pattern, with some room for improvement.  >60 Healthy dietary pattern, although there may be some specific behaviors that could be improved.    Nutrition Goals Re-Evaluation:   Nutrition Goals Discharge (Final Nutrition Goals Re-Evaluation):   Psychosocial: Target Goals: Acknowledge presence or absence of significant depression and/or stress, maximize coping skills, provide positive support system. Participant is able to verbalize types and ability to use techniques and skills needed for reducing stress and depression.  Initial Review & Psychosocial Screening:  Initial Psych Review & Screening - 06/04/23 1410       Initial Review   Current issues with Current  Depression;History of Depression;Current Anxiety/Panic;Current Stress Concerns;Current Psychotropic Meds;Current Sleep Concerns    Source of Stress Concerns Chronic Illness;Family;Occupation    Comments mother passed recently, heart healthy issues started at age 52, thrid valve replacement, anxiety has xanax and can take up to 3x a day, lexapro is helpign with sleep, back to work in April      Family Dynamics   Good Support System? Yes   wife and father, friends at church   Comments recent passing of his mother      Barriers   Psychosocial barriers to participate in program The patient should benefit from training in stress management and relaxation.;Psychosocial barriers identified (see note)      Screening  Interventions   Interventions Encouraged to exercise;To provide support and resources with identified psychosocial needs;Provide feedback about the scores to participant    Expected Outcomes Short Term goal: Utilizing psychosocial counselor, staff and physician to assist with identification of specific Stressors or current issues interfering with healing process. Setting desired goal for each stressor or current issue identified.;Long Term Goal: Stressors or current issues are controlled or eliminated.;Short Term goal: Identification and review with participant of any Quality of Life or Depression concerns found by scoring the questionnaire.;Long Term goal: The participant improves quality of Life and PHQ9 Scores as seen by post scores and/or verbalization of changes             Quality of Life Scores:  Quality of Life - 06/06/23 0946       Quality of Life   Select Quality of Life      Quality of Life Scores   Health/Function Pre 16.9 %    Socioeconomic Pre 17.64 %    Psych/Spiritual Pre 18.64 %    Family Pre 24 %    GLOBAL Pre 18.29 %            Scores of 19 and below usually indicate a poorer quality of life in these areas.  A difference of  2-3 points is a clinically  meaningful difference.  A difference of 2-3 points in the total score of the Quality of Life Index has been associated with significant improvement in overall quality of life, self-image, physical symptoms, and general health in studies assessing change in quality of life.  PHQ-9: Review Flowsheet       06/06/2023  Depression screen PHQ 2/9  Decreased Interest 2  Down, Depressed, Hopeless 1  PHQ - 2 Score 3  Altered sleeping 1  Tired, decreased energy 1  Change in appetite 0  Feeling bad or failure about yourself  1  Trouble concentrating 1  Moving slowly or fidgety/restless 0  Suicidal thoughts 0  PHQ-9 Score 7  Difficult doing work/chores Somewhat difficult   Interpretation of Total Score  Total Score Depression Severity:  1-4 = Minimal depression, 5-9 = Mild depression, 10-14 = Moderate depression, 15-19 = Moderately severe depression, 20-27 = Severe depression   Psychosocial Evaluation and Intervention:  Psychosocial Evaluation - 06/06/23 0950       Psychosocial Evaluation & Interventions   Interventions Stress management education;Relaxation education;Encouraged to exercise with the program and follow exercise prescription    Comments Patient has met his OOP maximum and does not have a co-payment. His PHQ-9 score was 7. He does feel depressed because he feels he is not able to contribute to the household expenses and this bothers him. He is looking forward to returning to work and believes these feelings will be resolved after he is able to work. He is looking forward to participating in the program.    Expected Outcomes Short: Attend rehab to improve strength and stamina Long: Conitnue to work to improve breathing and coping skills    Continue Psychosocial Services  Follow up required by staff             Psychosocial Re-Evaluation:   Psychosocial Discharge (Final Psychosocial Re-Evaluation):   Vocational Rehabilitation: Provide vocational rehab assistance to  qualifying candidates.   Vocational Rehab Evaluation & Intervention:  Vocational Rehab - 06/04/23 1422       Initial Vocational Rehab Evaluation & Intervention   Assessment shows need for Vocational Rehabilitation No   returning to work in April  Education: Education Goals: Education classes will be provided on a weekly basis, covering required topics. Participant will state understanding/return demonstration of topics presented.  Learning Barriers/Preferences:  Learning Barriers/Preferences - 06/04/23 1421       Learning Barriers/Preferences   Learning Barriers None    Learning Preferences Written Material;Skilled Demonstration             Education Topics: Hypertension, Hypertension Reduction -Define heart disease and high blood pressure. Discus how high blood pressure affects the body and ways to reduce high blood pressure.   Exercise and Your Heart -Discuss why it is important to exercise, the FITT principles of exercise, normal and abnormal responses to exercise, and how to exercise safely.   Angina -Discuss definition of angina, causes of angina, treatment of angina, and how to decrease risk of having angina.   Cardiac Medications -Review what the following cardiac medications are used for, how they affect the body, and side effects that may occur when taking the medications.  Medications include Aspirin, Beta blockers, calcium channel blockers, ACE Inhibitors, angiotensin receptor blockers, diuretics, digoxin, and antihyperlipidemics.   Congestive Heart Failure -Discuss the definition of CHF, how to live with CHF, the signs and symptoms of CHF, and how keep track of weight and sodium intake.   Heart Disease and Intimacy -Discus the effect sexual activity has on the heart, how changes occur during intimacy as we age, and safety during sexual activity.   Smoking Cessation / COPD -Discuss different methods to quit smoking, the health benefits of  quitting smoking, and the definition of COPD.   Nutrition I: Fats -Discuss the types of cholesterol, what cholesterol does to the heart, and how cholesterol levels can be controlled.   Nutrition II: Labels -Discuss the different components of food labels and how to read food label   Heart Parts/Heart Disease and PAD -Discuss the anatomy of the heart, the pathway of blood circulation through the heart, and these are affected by heart disease.   Stress I: Signs and Symptoms -Discuss the causes of stress, how stress may lead to anxiety and depression, and ways to limit stress.   Stress II: Relaxation -Discuss different types of relaxation techniques to limit stress.   Warning Signs of Stroke / TIA -Discuss definition of a stroke, what the signs and symptoms are of a stroke, and how to identify when someone is having stroke.   Knowledge Questionnaire Score:  Knowledge Questionnaire Score - 06/06/23 0946       Knowledge Questionnaire Score   Pre Score 18/24             Core Components/Risk Factors/Patient Goals at Admission:  Personal Goals and Risk Factors at Admission - 06/04/23 1422       Core Components/Risk Factors/Patient Goals on Admission    Weight Management Yes;Weight Loss;Obesity    Intervention Weight Management: Develop a combined nutrition and exercise program designed to reach desired caloric intake, while maintaining appropriate intake of nutrient and fiber, sodium and fats, and appropriate energy expenditure required for the weight goal.;Weight Management: Provide education and appropriate resources to help participant work on and attain dietary goals.;Weight Management/Obesity: Establish reasonable short term and long term weight goals.;Obesity: Provide education and appropriate resources to help participant work on and attain dietary goals.    Expected Outcomes Short Term: Continue to assess and modify interventions until short term weight is achieved;Long  Term: Adherence to nutrition and physical activity/exercise program aimed toward attainment of established weight goal;Weight Loss: Understanding of general  recommendations for a balanced deficit meal plan, which promotes 1-2 lb weight loss per week and includes a negative energy balance of [phone] kcal/d;Understanding recommendations for meals to include 15-35% energy as protein, 25-35% energy from fat, 35-60% energy from carbohydrates, less than 200mg  of dietary cholesterol, 20-35 gm of total fiber daily;Understanding of distribution of calorie intake throughout the day with the consumption of 4-5 meals/snacks    Tobacco Cessation Yes    Number of packs per day 0    Intervention Offer self-teaching materials, assist with locating and accessing local/national Quit Smoking programs, and support quit date choice.;Assist the participant in steps to quit. Provide individualized education and counseling about committing to Tobacco Cessation, relapse prevention, and pharmacological support that can be provided by physician.    Expected Outcomes Long Term: Complete abstinence from all tobacco products for at least 12 months from quit date.    Improve shortness of breath with ADL's Yes    Intervention Provide education, individualized exercise plan and daily activity instruction to help decrease symptoms of SOB with activities of daily living.    Expected Outcomes Short Term: Improve cardiorespiratory fitness to achieve a reduction of symptoms when performing ADLs;Long Term: Be able to perform more ADLs without symptoms or delay the onset of symptoms    Hypertension Yes    Intervention Provide education on lifestyle modifcations including regular physical activity/exercise, weight management, moderate sodium restriction and increased consumption of fresh fruit, vegetables, and low fat dairy, alcohol moderation, and smoking cessation.;Monitor prescription use compliance.    Expected Outcomes Short Term: Continued  assessment and intervention until BP is < 140/25mm HG in hypertensive participants. < 130/32mm HG in hypertensive participants with diabetes, heart failure or chronic kidney disease.;Long Term: Maintenance of blood pressure at goal levels.             Core Components/Risk Factors/Patient Goals Review:    Core Components/Risk Factors/Patient Goals at Discharge (Final Review):    ITP Comments:  ITP Comments     Row Name 06/04/23 1409           ITP Comments Completed virtual orientation today.  EP evaluation is scheduled for Wed 06/06/23 at 800 .  Documentation for diagnosis can be found in [facility] encounter 04/20/23 OV and admission.  Surgery was on 04/26/23.                Comments: Patient arrived for 1st visit/orientation/education at 0800. Patient was referred to CR by Dr. Ramiro Harvest with Dr. Jenene Slicker attending due to Status Post mechanical aortic valve replacement. During orientation advised patient on arrival and appointment times what to wear, what to do before, during and after exercise. Reviewed attendance and class policy.  Pt is scheduled to return Cardiac Rehab on 06/08/23 at 1100. Pt was advised to come to class 15 minutes before class starts.  Discussed RPE/Dpysnea scales. Patient participated in warm up stretches. Patient was able to complete 6 minute walk test.  Telemetry:Wide QRS/NSR. Patient was measured for the equipment. Discussed equipment safety with patient. Took patient pre-anthropometric measurements. Patient finished visit at 0945.

## 2023-06-06 NOTE — Progress Notes (Signed)
    BP was well-controlled at his office visit 6 days ago. If BP has been elevated at home as well, would increase Lisinopril to 20 mg daily. Repeat BMET in 7- 10 days (for medication management). Continue to track BP at home (will also be checked at cardiac rehab).   Signed, Ellsworth Lennox, PA-C 06/06/2023, 5:02 PM

## 2023-06-06 NOTE — Progress Notes (Signed)
 Patient presented today for NV- bp check.  Pt stated that he has had high reading the last few days. Pt did not bring readings with him.  Pt stated that SBP was reading 160-180's.  Pt c/o of dizziness- pt is questioning if Lisinopril needs to be increased back to 20 mg.  Pt stated that he is unable to walk far on his walks with spouse d/t dizziness issues.   BP in office: 162/84

## 2023-06-07 ENCOUNTER — Ambulatory Visit (HOSPITAL_COMMUNITY): Admit: 2023-06-07 | Payer: 59

## 2023-06-07 ENCOUNTER — Telehealth: Payer: Self-pay | Admitting: *Deleted

## 2023-06-07 DIAGNOSIS — Z79899 Other long term (current) drug therapy: Secondary | ICD-10-CM

## 2023-06-07 MED ORDER — LISINOPRIL 20 MG PO TABS: 20.0000 mg | ORAL_TABLET | Freq: Every day | ORAL | 3 refills | Status: DC

## 2023-06-07 NOTE — Progress Notes (Signed)
 Patient notified and verbalized understanding.

## 2023-06-07 NOTE — Telephone Encounter (Signed)
 Spoke with pt and informed of medication change. Increase Lisinopril to 20 mg daily and have lab work done at [facility] in 7-10 days. Pt voiced understanding and orders placed.

## 2023-06-08 ENCOUNTER — Encounter (HOSPITAL_COMMUNITY)
Admission: RE | Admit: 2023-06-08 | Discharge: 2023-06-08 | Disposition: A | Discharge status: Home / Self Care | Source: Ambulatory Visit | Attending: Internal Medicine | Admitting: Internal Medicine

## 2023-06-08 ENCOUNTER — Other Ambulatory Visit: Payer: Self-pay | Admitting: Adult Health

## 2023-06-08 ENCOUNTER — Telehealth: Payer: Self-pay | Admitting: Adult Health

## 2023-06-08 DIAGNOSIS — F410 Panic disorder [episodic paroxysmal anxiety] without agoraphobia: Secondary | ICD-10-CM

## 2023-06-08 DIAGNOSIS — Z952 Presence of prosthetic heart valve: Secondary | ICD-10-CM | POA: Diagnosis not present

## 2023-06-08 DIAGNOSIS — F411 Generalized anxiety disorder: Secondary | ICD-10-CM

## 2023-06-08 MED ORDER — ALPRAZOLAM 1 MG PO TABS: ORAL_TABLET | ORAL | 0 refills | Status: DC

## 2023-06-08 NOTE — Progress Notes (Signed)
 Daily Session Note  Patient Details  Name: Joeangel Jeanpaul MRN: [id number] Date of Birth: 12-19-1980 Referring Provider:   Flowsheet Row CARDIAC REHAB PHASE II ORIENTATION from 06/06/2023 in [facility] CARDIAC REHABILITATION  Referring Provider Luane School MD       Encounter Date: 06/08/2023  Check In:  Session Check In - 06/08/23 1100       Check-In   Supervising physician immediately available to respond to emergencies See telemetry face sheet for immediately available MD    Location [facility]    Staff Present Desiree Lucy, BSN, RN, Revonda Standard, RN, BSN;Jessica Juanetta Gosling, MA, RCEP, CCRP, CCET;Heather [place], [place], Exercise Physiologist    Virtual Visit No    Medication changes reported     No    Fall or balance concerns reported    No    Tobacco Cessation No Change    Warm-up and Cool-down Performed on first and last piece of equipment    Resistance Training Performed Yes    VAD Patient? No    PAD/SET Patient? No      Pain Assessment   Currently in Pain? No/denies             Capillary Blood Glucose: No results found for this or any previous visit (from the past 24 hours).    Social History   Tobacco Use  Smoking Status Former   Current packs/day: 0.50   Average packs/day: 0.5 packs/day for 0.2 years (0.1 ttl pk-yrs)   Types: Cigarettes   Start date: 04/06/2023   Quit date: 08/10/2019   Passive exposure: Never  Smokeless Tobacco Never  Tobacco Comments   Tries to chew nicotine gum at work - smoking 3 ciggs per day     Goals Met:  Proper associated with RPD/PD & O2 Sat Independence with exercise equipment Exercise tolerated well Personal goals reviewed No report of concerns or symptoms today Strength training completed today  Goals Unmet:  Not Applicable  Comments: First full day of exercise!  Patient was oriented to gym and equipment including functions, settings, policies, and procedures.  Patient's individual  exercise prescription and treatment plan were reviewed.  All starting workloads were established based on the results of the 6 minute walk test done at initial orientation visit.  The plan for exercise progression was also introduced and progression will be customized based on patient's performance and goals.

## 2023-06-08 NOTE — Telephone Encounter (Signed)
 Pt called asking for a refill on his xanax. Pharmacy is [facility]  on [address] in [place]. Next appt  5/21

## 2023-06-08 NOTE — Telephone Encounter (Signed)
 LF 2/24, due 3/24

## 2023-06-11 ENCOUNTER — Telehealth (HOSPITAL_COMMUNITY): Payer: Self-pay | Admitting: *Deleted

## 2023-06-11 ENCOUNTER — Encounter (HOSPITAL_COMMUNITY)

## 2023-06-11 NOTE — Telephone Encounter (Signed)
 Patient missed class this morning.  Called to check in on patient. He was not feeling well this morning and did not want to share.  Hopes to return on Wednesday.

## 2023-06-13 ENCOUNTER — Encounter: Payer: Self-pay | Admitting: *Deleted

## 2023-06-13 ENCOUNTER — Encounter (HOSPITAL_COMMUNITY): Payer: Self-pay | Admitting: *Deleted

## 2023-06-13 ENCOUNTER — Encounter (HOSPITAL_COMMUNITY)
Admission: RE | Admit: 2023-06-13 | Discharge: 2023-06-13 | Disposition: A | Discharge status: Home / Self Care | Source: Ambulatory Visit | Attending: Internal Medicine | Admitting: Internal Medicine

## 2023-06-13 DIAGNOSIS — Z952 Presence of prosthetic heart valve: Secondary | ICD-10-CM

## 2023-06-13 NOTE — Progress Notes (Signed)
 Daily Session Note  Patient Details  Name: Eulon Allnutt MRN: [id number] Date of Birth: January 20, 1981 Referring Provider:   Flowsheet Row CARDIAC REHAB PHASE II ORIENTATION from 06/06/2023 in [facility] CARDIAC REH[facility]ILITATION  Referring Provider Luane School MD       Encounter Date: 06/13/2023  Check In:  Session Check In - 06/13/23 1118       Check-In   Supervising physician immediately available to respond to emergencies See telemetry face sheet for immediately available MD    Location [facility]    Staff Present Desiree Lucy, BSN, RN, Revonda Standard, RN, BSN;Jessica Juanetta Gosling, MA, RCEP, CCRP, CCET;Heather Gaynell Face, Exercise Physiologist;Hillary Leonidas Romberg BSN, RN    Virtual Visit No    Medication changes reported     No    Fall or balance concerns reported    No    Tobacco Cessation No Change    Warm-up and Cool-down Performed on first and last piece of equipment    Resistance Training Performed Yes    VAD Patient? No    PAD/SET Patient? No      Pain Assessment   Currently in Pain? No/denies             Capillary Blood Glucose: No results found for this or any previous visit (from the past 24 hours).    Social History   Tobacco Use  Smoking Status Former   Current packs/day: 0.50   Average packs/day: 0.5 packs/day for 0.2 years (0.1 ttl pk-yrs)   Types: Cigarettes   Start date: 04/06/2023   Quit date: 08/10/2019   Passive exposure: Never  Smokeless Tobacco Never  Tobacco Comments   Tries to chew nicotine gum at work - smoking 3 ciggs per day     Goals Met:  Independence with exercise equipment Improved SOB with ADL's Queuing for purse lip breathing No report of concerns or symptoms today  Goals Unmet:  Not Applicable  Comments: Pt able to follow exercise prescription today without complaint.  Will continue to monitor for progression.

## 2023-06-13 NOTE — Progress Notes (Signed)
 Cardiac Individual Treatment Plan  Patient Details  Name: Chase Lee MRN: [id number] Date of Birth: 1980/05/01 Referring Provider:   Flowsheet Row CARDIAC REHAB PHASE II ORIENTATION from 06/06/2023 in [facility] CARDIAC REHABILITATION  Referring Provider Luane School MD       Initial Encounter Date:  Flowsheet Row CARDIAC REHAB PHASE II ORIENTATION from 06/06/2023 in [facility] CARDIAC REHABILITATION  Date 06/06/23       Visit Diagnosis: S/P AVR (aortic valve replacement)  Patient's Home Medications on Admission:  Current Outpatient Medications:    ALPRAZolam (XANAX) 1 MG tablet, TAKE 1 TABLET(1 MG) BY MOUTH THREE TIMES DAILY AS NEEDED FOR ANXIETY, Disp: 90 tablet, Rfl: 0   amiodarone (PACERONE) 200 MG tablet, Take 1 tablet (200 mg total) by mouth 2 (two) times daily for 14 days, THEN 1 tablet (200 mg total) daily., Disp: 88 tablet, Rfl: 0   aspirin EC 81 MG tablet, Take 1 tablet (81 mg total) by mouth daily. Swallow whole., Disp: 30 tablet, Rfl: 12   Compression Bandages KIT, 1 each by Does not apply route as needed (swelling). Adult compression stocking 15-20 adult size medium-large, Disp: 1 kit, Rfl: 0   escitalopram (LEXAPRO) 10 MG tablet, TAKE 1 TABLET(10 MG) BY MOUTH DAILY, Disp: 90 tablet, Rfl: 0   lisinopril (ZESTRIL) 20 MG tablet, Take 1 tablet (20 mg total) by mouth daily., Disp: 90 tablet, Rfl: 3   metoprolol tartrate (LOPRESSOR) 25 MG tablet, Take 1 tablet (25 mg total) by mouth 2 (two) times daily., Disp: 30 tablet, Rfl: 5   omeprazole (PRILOSEC) 40 MG capsule, Take 1 capsule (40 mg total) by mouth daily., Disp: 60 capsule, Rfl: 0   oxycodone (OXY-IR) 5 MG capsule, Take 1 capsule (5 mg total) by mouth every 6 (six) hours as needed for pain., Disp: 30 capsule, Rfl: 0   warfarin (COUMADIN) 5 MG tablet, Take 1 tablet (5 mg total) by mouth daily., Disp: 30 tablet, Rfl: 5  Past Medical History: Past Medical History:  Diagnosis Date   Anxiety    History of  artificial heart valve    a. s/p aortic valvuloplasty at age 80 b. subsequent mechanical AVR at 43 yo c. 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   HTN (hypertension) 04/05/2023    Tobacco Use: Social History   Tobacco Use  Smoking Status Former   Current packs/day: 0.50   Average packs/day: 0.5 packs/day for 0.2 years (0.1 ttl pk-yrs)   Types: Cigarettes   Start date: 04/06/2023   Quit date: 08/10/2019   Passive exposure: Never  Smokeless Tobacco Never  Tobacco Comments   Tries to chew nicotine gum at work - smoking 3 ciggs per day     Labs: Review Flowsheet       Latest Ref Rng & Units 04/26/2023 04/27/2023  Labs for ITP Cardiac and Pulmonary Rehab  Hemoglobin A1c 4.8 - 5.6 % - 6.5   PH, Arterial 7.35 - 7.45 7.334  7.330  7.350  7.319  7.349  7.391  7.418  7.275  7.391  7.343   PCO2 arterial 32 - 48 mmHg 33.2  43.7  40.4  48.7  41.8  39.9  34.7  54.9  36.9  39.1   Bicarbonate 20.0 - 28.0 mmol/L 17.7  23.1  22.4  25.4  23.0  24.2  22.4  25.5  22.9  22.4  21.3   TCO2 22 - 32 mmol/L 19  24  24  27  22  24  25  25  25  23  25  27  26  23  24  24  23  24  22    Acid-base deficit 0.0 - 2.0 mmol/L 7.0  3.0  3.0  1.0  2.0  1.0  2.0  2.0  3.0  2.0  4.0   O2 Saturation % 95  92  91  93  95  100  100  100  71  100  95     Details       Multiple values from one day are sorted in reverse-chronological order         Capillary Blood Glucose: Lab Results  Component Value Date   GLUCAP 106 (H) 04/29/2023   GLUCAP 129 (H) 04/28/2023   GLUCAP 122 (H) 04/28/2023   GLUCAP 171 (H) 04/28/2023   GLUCAP 168 (H) 04/28/2023     Exercise Target Goals: Exercise Program Goal: Individual exercise prescription set using results from initial 6 min walk test and THRR while considering  patient's activity barriers and safety.   Exercise Prescription Goal: Starting with aerobic activity 30 plus minutes a day, 3 days  per week for initial exercise prescription. Provide home exercise prescription and guidelines that participant acknowledges understanding prior to discharge.  Activity Barriers & Risk Stratification:  Activity Barriers & Cardiac Risk Stratification - 06/04/23 1409       Activity Barriers & Cardiac Risk Stratification   Activity Barriers Incisional Pain;Joint Problems   occassional stiff joints   Cardiac Risk Stratification High             6 Minute Walk:  6 Minute Walk     Row Name 06/06/23 0940         6 Minute Walk   Phase Initial     Distance 910 feet     Walk Time 6 minutes     # of Rest Breaks 0     MPH 1.72     METS 3.01     RPE 12     Perceived Dyspnea  1     VO2 Peak 10.54     Symptoms No     Resting HR 62 bpm     Resting BP 150/80     Resting Oxygen Saturation  97 %     Exercise Oxygen Saturation  during 6 min walk 97 %     Max Ex. HR 80 bpm     Max Ex. BP 146/78     2 Minute Post BP 132/78              Oxygen Initial Assessment:   Oxygen Re-Evaluation:   Oxygen Discharge (Final Oxygen Re-Evaluation):   Initial Exercise Prescription:  Initial Exercise Prescription - 06/06/23 0900       Date of Initial Exercise RX and Referring Provider   Date 06/06/23    Referring Provider Luane School MD      Treadmill   MPH 2    Grade 0.5    Minutes 15    METs 2.67      Bike   Level 5    Minutes 15    METs 1.8      Prescription Details   Frequency (times per week) 3    Duration Progress to 30 minutes of continuous aerobic without signs/symptoms of physical distress      Intensity   THRR 40-80% of Max Heartrate 107-152    Ratings  of Perceived Exertion 11-13    Perceived Dyspnea 0-4      Resistance Training   Training Prescription Yes    Weight 5    Reps 10-15             Perform Capillary Blood Glucose checks as needed.  Exercise Prescription Changes:   Exercise Comments:   Exercise Comments     Row Name 06/08/23  1138           Exercise Comments First full day of exercise!  Patient was oriented to gym and equipment including functions, settings, policies, and procedures.  Patient's individual exercise prescription and treatment plan were reviewed.  All starting workloads were established based on the results of the 6 minute walk test done at initial orientation visit.  The plan for exercise progression was also introduced and progression will be customized based on patient's performance and goals.                Exercise Goals and Review:   Exercise Goals     Row Name 06/04/23 1410             Exercise Goals   Increase Physical Activity Yes       Intervention Provide advice, education, support and counseling about physical activity/exercise needs.;Develop an individualized exercise prescription for aerobic and resistive training based on initial evaluation findings, risk stratification, comorbidities and participant's personal goals.       Expected Outcomes Long Term: Exercising regularly at least 3-5 days a week.;Short Term: Attend rehab on a regular basis to increase amount of physical activity.;Long Term: Add in home exercise to make exercise part of routine and to increase amount of physical activity.       Increase Strength and Stamina Yes       Intervention Provide advice, education, support and counseling about physical activity/exercise needs.;Develop an individualized exercise prescription for aerobic and resistive training based on initial evaluation findings, risk stratification, comorbidities and participant's personal goals.       Expected Outcomes Short Term: Increase workloads from initial exercise prescription for resistance, speed, and METs.;Long Term: Improve cardiorespiratory fitness, muscular endurance and strength as measured by increased METs and functional capacity ( );Short Term: Perform resistance training exercises routinely during rehab and add in resistance training  at home       Able to understand and use rate of perceived exertion (RPE) scale Yes       Intervention Provide education and explanation on how to use RPE scale       Expected Outcomes Short Term: Able to use RPE daily in rehab to express subjective intensity level;Long Term:  Able to use RPE to guide intensity level when exercising independently       Able to understand and use Dyspnea scale Yes       Intervention Provide education and explanation on how to use Dyspnea scale       Expected Outcomes Short Term: Able to use Dyspnea scale daily in rehab to express subjective sense of shortness of breath during exertion;Long Term: Able to use Dyspnea scale to guide intensity level when exercising independently       Knowledge and understanding of Target Heart Rate Range (THRR) Yes       Intervention Provide education and explanation of THRR including how the numbers were predicted and where they are located for reference       Expected Outcomes Short Term: Able to state/look up THRR;Long Term: Able to use THRR to  govern intensity when exercising independently;Short Term: Able to use daily as guideline for intensity in rehab       Able to check pulse independently Yes       Intervention Provide education and demonstration on how to check pulse in carotid and radial arteries.;Review the importance of being able to check your own pulse for safety during independent exercise       Expected Outcomes Short Term: Able to explain why pulse checking is important during independent exercise;Long Term: Able to check pulse independently and accurately       Understanding of Exercise Prescription Yes       Intervention Provide education, explanation, and written materials on patient's individual exercise prescription       Expected Outcomes Short Term: Able to explain program exercise prescription;Long Term: Able to explain home exercise prescription to exercise independently                Exercise Goals  Re-Evaluation :    Discharge Exercise Prescription (Final Exercise Prescription Changes):   Nutrition:  Target Goals: Understanding of nutrition guidelines, daily intake of sodium 1500mg , cholesterol 200mg , calories 30% from fat and 7% or less from saturated fats, daily to have 5 or more servings of fruits and vegetables.  Biometrics:  Pre Biometrics - 06/06/23 0944       Pre Biometrics   Height 5' 7.5" (1.715 m)    Weight 230 lb 13.2 oz (104.7 kg)    Waist Circumference 42 inches    Hip Circumference 44 inches    Waist to Hip Ratio 0.95 %    BMI (Calculated) 35.6    Grip Strength 44.5 kg    Single Leg Stand 10.14 seconds              Nutrition Therapy Plan and Nutrition Goals:  Nutrition Therapy & Goals - 06/04/23 1421       Intervention Plan   Intervention Prescribe, educate and counsel regarding individualized specific dietary modifications aiming towards targeted core components such as weight, hypertension, lipid management, diabetes, heart failure and other comorbidities.;Nutrition handout(s) given to patient.    Expected Outcomes Short Term Goal: Understand basic principles of dietary content, such as calories, fat, sodium, cholesterol and nutrients.;Long Term Goal: Adherence to prescribed nutrition plan.             Nutrition Assessments:  MEDIFICTS Score Key: >=70 Need to make dietary changes  40-70 Heart Healthy Diet <= 40 Therapeutic Level Cholesterol Diet  Flowsheet Row CARDIAC REHAB PHASE II ORIENTATION from 06/06/2023 in [facility] CARDIAC REHABILITATION  Picture Your Plate Total Score on Admission 58      Picture Your Plate Scores: <21 Unhealthy dietary pattern with much room for improvement. 41-50 Dietary pattern unlikely to meet recommendations for good health and room for improvement. 51-60 More healthful dietary pattern, with some room for improvement.  >60 Healthy dietary pattern, although there may be some specific behaviors that  could be improved.    Nutrition Goals Re-Evaluation:   Nutrition Goals Discharge (Final Nutrition Goals Re-Evaluation):   Psychosocial: Target Goals: Acknowledge presence or absence of significant depression and/or stress, maximize coping skills, provide positive support system. Participant is able to verbalize types and ability to use techniques and skills needed for reducing stress and depression.  Initial Review & Psychosocial Screening:  Initial Psych Review & Screening - 06/04/23 1410       Initial Review   Current issues with Current Depression;History of Depression;Current Anxiety/Panic;Current Stress Concerns;Current Psychotropic  Meds;Current Sleep Concerns    Source of Stress Concerns Chronic Illness;Family;Occupation    Comments mother passed recently, heart healthy issues started at age 45, thrid valve replacement, anxiety has xanax and can take up to 3x a day, lexapro is helpign with sleep, back to work in April      Family Dynamics   Good Support System? Yes   wife and father, friends at church   Comments recent passing of his mother      Barriers   Psychosocial barriers to participate in program The patient should benefit from training in stress management and relaxation.;Psychosocial barriers identified (see note)      Screening Interventions   Interventions Encouraged to exercise;To provide support and resources with identified psychosocial needs;Provide feedback about the scores to participant    Expected Outcomes Short Term goal: Utilizing psychosocial counselor, staff and physician to assist with identification of specific Stressors or current issues interfering with healing process. Setting desired goal for each stressor or current issue identified.;Long Term Goal: Stressors or current issues are controlled or eliminated.;Short Term goal: Identification and review with participant of any Quality of Life or Depression concerns found by scoring the questionnaire.;Long  Term goal: The participant improves quality of Life and PHQ9 Scores as seen by post scores and/or verbalization of changes             Quality of Life Scores:  Quality of Life - 06/06/23 0946       Quality of Life   Select Quality of Life      Quality of Life Scores   Health/Function Pre 16.9 %    Socioeconomic Pre 17.64 %    Psych/Spiritual Pre 18.64 %    Family Pre 24 %    GLOBAL Pre 18.29 %            Scores of 19 and below usually indicate a poorer quality of life in these areas.  A difference of  2-3 points is a clinically meaningful difference.  A difference of 2-3 points in the total score of the Quality of Life Index has been associated with significant improvement in overall quality of life, self-image, physical symptoms, and general health in studies assessing change in quality of life.  PHQ-9: Review Flowsheet       06/06/2023  Depression screen PHQ 2/9  Decreased Interest 2  Down, Depressed, Hopeless 1  PHQ - 2 Score 3  Altered sleeping 1  Tired, decreased energy 1  Change in appetite 0  Feeling bad or failure about yourself  1  Trouble concentrating 1  Moving slowly or fidgety/restless 0  Suicidal thoughts 0  PHQ-9 Score 7  Difficult doing work/chores Somewhat difficult   Interpretation of Total Score  Total Score Depression Severity:  1-4 = Minimal depression, 5-9 = Mild depression, 10-14 = Moderate depression, 15-19 = Moderately severe depression, 20-27 = Severe depression   Psychosocial Evaluation and Intervention:  Psychosocial Evaluation - 06/06/23 0950       Psychosocial Evaluation & Interventions   Interventions Stress management education;Relaxation education;Encouraged to exercise with the program and follow exercise prescription    Comments Patient has met his OOP maximum and does not have a co-payment. His PHQ-9 score was 7. He does feel depressed because he feels he is not able to contribute to the household expenses and this bothers  him. He is looking forward to returning to work and believes these feelings will be resolved after he is able to work. He is looking  forward to participating in the program.    Expected Outcomes Short: Attend rehab to improve strength and stamina Long: Conitnue to work to improve breathing and coping skills    Continue Psychosocial Services  Follow up required by staff             Psychosocial Re-Evaluation:   Psychosocial Discharge (Final Psychosocial Re-Evaluation):   Vocational Rehabilitation: Provide vocational rehab assistance to qualifying candidates.   Vocational Rehab Evaluation & Intervention:  Vocational Rehab - 06/04/23 1422       Initial Vocational Rehab Evaluation & Intervention   Assessment shows need for Vocational Rehabilitation No   returning to work in April            Education: Education Goals: Education classes will be provided on a weekly basis, covering required topics. Participant will state understanding/return demonstration of topics presented.  Learning Barriers/Preferences:  Learning Barriers/Preferences - 06/04/23 1421       Learning Barriers/Preferences   Learning Barriers None    Learning Preferences Written Material;Skilled Demonstration             Education Topics: Hypertension, Hypertension Reduction -Define heart disease and high blood pressure. Discus how high blood pressure affects the body and ways to reduce high blood pressure.   Exercise and Your Heart -Discuss why it is important to exercise, the FITT principles of exercise, normal and abnormal responses to exercise, and how to exercise safely.   Angina -Discuss definition of angina, causes of angina, treatment of angina, and how to decrease risk of having angina.   Cardiac Medications -Review what the following cardiac medications are used for, how they affect the body, and side effects that may occur when taking the medications.  Medications include Aspirin,  Beta blockers, calcium channel blockers, ACE Inhibitors, angiotensin receptor blockers, diuretics, digoxin, and antihyperlipidemics.   Congestive Heart Failure -Discuss the definition of CHF, how to live with CHF, the signs and symptoms of CHF, and how keep track of weight and sodium intake.   Heart Disease and Intimacy -Discus the effect sexual activity has on the heart, how changes occur during intimacy as we age, and safety during sexual activity.   Smoking Cessation / COPD -Discuss different methods to quit smoking, the health benefits of quitting smoking, and the definition of COPD.   Nutrition I: Fats -Discuss the types of cholesterol, what cholesterol does to the heart, and how cholesterol levels can be controlled.   Nutrition II: Labels -Discuss the different components of food labels and how to read food label   Heart Parts/Heart Disease and PAD -Discuss the anatomy of the heart, the pathway of blood circulation through the heart, and these are affected by heart disease.   Stress I: Signs and Symptoms -Discuss the causes of stress, how stress may lead to anxiety and depression, and ways to limit stress.   Stress II: Relaxation -Discuss different types of relaxation techniques to limit stress.   Warning Signs of Stroke / TIA -Discuss definition of a stroke, what the signs and symptoms are of a stroke, and how to identify when someone is having stroke.   Knowledge Questionnaire Score:  Knowledge Questionnaire Score - 06/06/23 0946       Knowledge Questionnaire Score   Pre Score 18/24             Core Components/Risk Factors/Patient Goals at Admission:  Personal Goals and Risk Factors at Admission - 06/04/23 1422       Core Components/Risk Factors/Patient Goals on  Admission    Weight Management Yes;Weight Loss;Obesity    Intervention Weight Management: Develop a combined nutrition and exercise program designed to reach desired caloric intake, while  maintaining appropriate intake of nutrient and fiber, sodium and fats, and appropriate energy expenditure required for the weight goal.;Weight Management: Provide education and appropriate resources to help participant work on and attain dietary goals.;Weight Management/Obesity: Establish reasonable short term and long term weight goals.;Obesity: Provide education and appropriate resources to help participant work on and attain dietary goals.    Expected Outcomes Short Term: Continue to assess and modify interventions until short term weight is achieved;Long Term: Adherence to nutrition and physical activity/exercise program aimed toward attainment of established weight goal;Weight Loss: Understanding of general recommendations for a balanced deficit meal plan, which promotes 1-2 lb weight loss per week and includes a negative energy balance of [id number] kcal/d;Understanding recommendations for meals to include 15-35% energy as protein, 25-35% energy from fat, 35-60% energy from carbohydrates, less than 200mg  of dietary cholesterol, 20-35 gm of total fiber daily;Understanding of distribution of calorie intake throughout the day with the consumption of 4-5 meals/snacks    Tobacco Cessation Yes    Number of packs per day 0    Intervention Offer self-teaching materials, assist with locating and accessing local/national Quit Smoking programs, and support quit date choice.;Assist the participant in steps to quit. Provide individualized education and counseling about committing to Tobacco Cessation, relapse prevention, and pharmacological support that can be provided by physician.    Expected Outcomes Long Term: Complete abstinence from all tobacco products for at least 12 months from quit date.    Improve shortness of breath with ADL's Yes    Intervention Provide education, individualized exercise plan and daily activity instruction to help decrease symptoms of SOB with activities of daily living.    Expected  Outcomes Short Term: Improve cardiorespiratory fitness to achieve a reduction of symptoms when performing ADLs;Long Term: Be able to perform more ADLs without symptoms or delay the onset of symptoms    Hypertension Yes    Intervention Provide education on lifestyle modifcations including regular physical activity/exercise, weight management, moderate sodium restriction and increased consumption of fresh fruit, vegetables, and low fat dairy, alcohol moderation, and smoking cessation.;Monitor prescription use compliance.    Expected Outcomes Short Term: Continued assessment and intervention until BP is < 140/34mm HG in hypertensive participants. < 130/32mm HG in hypertensive participants with diabetes, heart failure or chronic kidney disease.;Long Term: Maintenance of blood pressure at goal levels.             Core Components/Risk Factors/Patient Goals Review:    Core Components/Risk Factors/Patient Goals at Discharge (Final Review):    ITP Comments:  ITP Comments     Row Name 06/04/23 1409 06/06/23 1005 06/13/23 0941       ITP Comments Completed virtual orientation today.  EP evaluation is scheduled for Wed 06/06/23 at 800 .  Documentation for diagnosis can be found in [facility] encounter 04/20/23 OV and admission.  Surgery was on 04/26/23. Patient arrived for 1st visit/orientation/education at 0800. Patient was referred to CR by Dr. Ramiro Harvest with Dr. Jenene Slicker attending due to Status Post mechanical aortic valve replacement. During orientation advised patient on arrival and appointment times what to wear, what to do before, during and after exercise. Reviewed attendance and class policy.  Pt is scheduled to return Cardiac Rehab on 06/08/23 at 1100. Pt was advised to come to class 15 minutes before class starts.  Discussed RPE/Dpysnea scales.  Patient participated in warm up stretches. Patient was able to complete 6 minute walk test.  Telemetry:Wide QRS/NSR. Patient was measured for the equipment.  Discussed equipment safety with patient. Took patient pre-anthropometric measurements. Patient finished visit at 0945. 30 day review completed. ITP sent to Dr. Dina Rich, Medical Director of Cardiac Rehab. Continue with ITP unless changes are made by physician.  New to program, has only completed first day of exercise thus far.              Comments: 30 day review

## 2023-06-15 ENCOUNTER — Encounter (HOSPITAL_COMMUNITY)

## 2023-06-18 ENCOUNTER — Observation Stay (HOSPITAL_COMMUNITY)
Admission: EM | Admit: 2023-06-18 | Discharge: 2023-06-19 | Disposition: A | Discharge status: Home Health | Attending: Family Medicine | Admitting: Family Medicine

## 2023-06-18 ENCOUNTER — Other Ambulatory Visit: Payer: Self-pay

## 2023-06-18 ENCOUNTER — Observation Stay (HOSPITAL_COMMUNITY)

## 2023-06-18 ENCOUNTER — Emergency Department (HOSPITAL_BASED_OUTPATIENT_CLINIC_OR_DEPARTMENT_OTHER)

## 2023-06-18 ENCOUNTER — Other Ambulatory Visit (HOSPITAL_COMMUNITY): Payer: Self-pay | Admitting: *Deleted

## 2023-06-18 ENCOUNTER — Encounter (HOSPITAL_COMMUNITY): Admission: RE | Admit: 2023-06-18 | Source: Ambulatory Visit

## 2023-06-18 ENCOUNTER — Emergency Department (HOSPITAL_COMMUNITY)

## 2023-06-18 ENCOUNTER — Encounter

## 2023-06-18 ENCOUNTER — Encounter (HOSPITAL_COMMUNITY): Payer: Self-pay | Admitting: Emergency Medicine

## 2023-06-18 DIAGNOSIS — Z7982 Long term (current) use of aspirin: Secondary | ICD-10-CM | POA: Insufficient documentation

## 2023-06-18 DIAGNOSIS — D6869 Other thrombophilia: Secondary | ICD-10-CM | POA: Diagnosis not present

## 2023-06-18 DIAGNOSIS — R0789 Other chest pain: Principal | ICD-10-CM | POA: Diagnosis present

## 2023-06-18 DIAGNOSIS — D509 Iron deficiency anemia, unspecified: Secondary | ICD-10-CM | POA: Diagnosis not present

## 2023-06-18 DIAGNOSIS — I509 Heart failure, unspecified: Secondary | ICD-10-CM | POA: Diagnosis not present

## 2023-06-18 DIAGNOSIS — Z7901 Long term (current) use of anticoagulants: Secondary | ICD-10-CM | POA: Diagnosis not present

## 2023-06-18 DIAGNOSIS — I879 Disorder of vein, unspecified: Secondary | ICD-10-CM | POA: Diagnosis present

## 2023-06-18 DIAGNOSIS — I110 Hypertensive heart disease with heart failure: Secondary | ICD-10-CM | POA: Insufficient documentation

## 2023-06-18 DIAGNOSIS — I10 Essential (primary) hypertension: Secondary | ICD-10-CM

## 2023-06-18 DIAGNOSIS — Z952 Presence of prosthetic heart valve: Secondary | ICD-10-CM

## 2023-06-18 DIAGNOSIS — R079 Chest pain, unspecified: Secondary | ICD-10-CM

## 2023-06-18 DIAGNOSIS — R935 Abnormal findings on diagnostic imaging of other abdominal regions, including retroperitoneum: Secondary | ICD-10-CM | POA: Diagnosis not present

## 2023-06-18 DIAGNOSIS — K219 Gastro-esophageal reflux disease without esophagitis: Secondary | ICD-10-CM | POA: Diagnosis not present

## 2023-06-18 DIAGNOSIS — I480 Paroxysmal atrial fibrillation: Secondary | ICD-10-CM | POA: Diagnosis present

## 2023-06-18 DIAGNOSIS — Z87891 Personal history of nicotine dependence: Secondary | ICD-10-CM | POA: Diagnosis not present

## 2023-06-18 DIAGNOSIS — I5021 Acute systolic (congestive) heart failure: Secondary | ICD-10-CM | POA: Diagnosis not present

## 2023-06-18 DIAGNOSIS — D649 Anemia, unspecified: Secondary | ICD-10-CM | POA: Diagnosis present

## 2023-06-18 DIAGNOSIS — I4891 Unspecified atrial fibrillation: Secondary | ICD-10-CM

## 2023-06-18 DIAGNOSIS — Z79899 Other long term (current) drug therapy: Secondary | ICD-10-CM | POA: Insufficient documentation

## 2023-06-18 LAB — ECHOCARDIOGRAM COMPLETE
AR max vel: 2.42 cm2
AV Area VTI: 2.39 cm2
AV Area mean vel: 2.57 cm2
AV Mean grad: 9 mmHg
AV Peak grad: 19.2 mmHg
Ao pk vel: 2.19 m/s
Area-P 1/2: 4.06 cm2
Calc EF: 55 %
S' Lateral: 3.9 cm
Single Plane A2C EF: 56.4 %
Single Plane A4C EF: 56.9 %

## 2023-06-18 LAB — CBC WITH DIFFERENTIAL/PLATELET
Abs Immature Granulocytes: 0.03 10*3/uL (ref 0.00–0.07)
Basophils Absolute: 0 10*3/uL (ref 0.0–0.1)
Basophils Relative: 1 %
Eosinophils Absolute: 0.1 10*3/uL (ref 0.0–0.5)
Eosinophils Relative: 2 %
HCT: 27.8 % — ABNORMAL LOW (ref 39.0–52.0)
Hemoglobin: 8.5 g/dL — ABNORMAL LOW (ref 13.0–17.0)
Immature Granulocytes: 0 %
Lymphocytes Relative: 15 %
Lymphs Abs: 1 10*3/uL (ref 0.7–4.0)
MCH: 24.6 pg — ABNORMAL LOW (ref 26.0–34.0)
MCHC: 30.6 g/dL (ref 30.0–36.0)
MCV: 80.6 fL (ref 80.0–100.0)
Monocytes Absolute: 0.5 10*3/uL (ref 0.1–1.0)
Monocytes Relative: 7 %
Neutro Abs: 5.2 10*3/uL (ref 1.7–7.7)
Neutrophils Relative %: 75 %
Platelets: 261 10*3/uL (ref 150–400)
RBC: 3.45 MIL/uL — ABNORMAL LOW (ref 4.22–5.81)
RDW: 16.5 % — ABNORMAL HIGH (ref 11.5–15.5)
WBC: 6.8 10*3/uL (ref 4.0–10.5)
nRBC: 0 % (ref 0.0–0.2)

## 2023-06-18 LAB — POC OCCULT BLOOD, ED: Fecal Occult Bld: NEGATIVE

## 2023-06-18 LAB — COMPREHENSIVE METABOLIC PANEL WITH GFR
ALT: 29 U/L (ref 0–44)
AST: 25 U/L (ref 15–41)
Albumin: 3.7 g/dL (ref 3.5–5.0)
Alkaline Phosphatase: 94 U/L (ref 38–126)
Anion gap: 11 (ref 5–15)
BUN: 18 mg/dL (ref 6–20)
CO2: 24 mmol/L (ref 22–32)
Calcium: 8.6 mg/dL — ABNORMAL LOW (ref 8.9–10.3)
Chloride: 101 mmol/L (ref 98–111)
Creatinine, Ser: 1.14 mg/dL (ref 0.61–1.24)
GFR, Estimated: >60 mL/min (ref >60)
Glucose, Bld: 167 mg/dL — ABNORMAL HIGH (ref 70–99)
Potassium: 3.8 mmol/L (ref 3.5–5.1)
Sodium: 136 mmol/L (ref 135–145)
Total Bilirubin: 0.5 mg/dL (ref 0.0–1.2)
Total Protein: 6.8 g/dL (ref 6.5–8.1)

## 2023-06-18 LAB — PROTIME-INR
INR: 2.7 — ABNORMAL HIGH (ref 0.8–1.2)
Prothrombin Time: 28.7 s — ABNORMAL HIGH (ref 11.4–15.2)

## 2023-06-18 LAB — I-STAT CHEM 8, ED
BUN: 17 mg/dL (ref 6–20)
Calcium, Ion: 1.15 mmol/L (ref 1.15–1.40)
Chloride: 103 mmol/L (ref 98–111)
Creatinine, Ser: 1.2 mg/dL (ref 0.61–1.24)
Glucose, Bld: 152 mg/dL — ABNORMAL HIGH (ref 70–99)
HCT: 28 % — ABNORMAL LOW (ref 39.0–52.0)
Hemoglobin: 9.5 g/dL — ABNORMAL LOW (ref 13.0–17.0)
Potassium: 3.9 mmol/L (ref 3.5–5.1)
Sodium: 140 mmol/L (ref 135–145)
TCO2: 25 mmol/L (ref 22–32)

## 2023-06-18 LAB — TROPONIN I (HIGH SENSITIVITY)
Troponin I (High Sensitivity): 26 ng/L — ABNORMAL HIGH (ref <18)
Troponin I (High Sensitivity): 27 ng/L — ABNORMAL HIGH (ref <18)

## 2023-06-18 LAB — RETICULOCYTES
Immature Retic Fract: 32.1 % — ABNORMAL HIGH (ref 2.3–15.9)
RBC.: 3.46 MIL/uL — ABNORMAL LOW (ref 4.22–5.81)
Retic Count, Absolute: 65.9 10*3/uL (ref 19.0–186.0)
Retic Ct Pct: 1.9 % (ref 0.4–3.1)

## 2023-06-18 LAB — IRON AND TIBC
Iron: 17 ug/dL — ABNORMAL LOW (ref 45–182)
Saturation Ratios: 4 % — ABNORMAL LOW (ref 17.9–39.5)
TIBC: 439 ug/dL (ref 250–450)
UIBC: 422 ug/dL

## 2023-06-18 LAB — BRAIN NATRIURETIC PEPTIDE: B Natriuretic Peptide: 616 pg/mL — ABNORMAL HIGH (ref 0.0–100.0)

## 2023-06-18 LAB — LIPASE, BLOOD: Lipase: 25 U/L (ref 11–51)

## 2023-06-18 LAB — FOLATE: Folate: 9.3 ng/mL (ref >5.9)

## 2023-06-18 LAB — FERRITIN: Ferritin: 11 ng/mL — ABNORMAL LOW (ref 24–336)

## 2023-06-18 LAB — VITAMIN B12: Vitamin B-12: 493 pg/mL (ref 180–914)

## 2023-06-18 MED ORDER — POTASSIUM CHLORIDE CRYS ER 20 MEQ PO TBCR
40.0000 meq | EXTENDED_RELEASE_TABLET | Freq: Once | ORAL | Status: AC
  Administered 2023-06-18: 40 meq via ORAL
  Filled 2023-06-18: qty 2

## 2023-06-18 MED ORDER — MORPHINE SULFATE (PF) 4 MG/ML IV SOLN
4.0000 mg | Freq: Once | INTRAVENOUS | Status: AC
  Administered 2023-06-18: 4 mg via INTRAVENOUS
  Filled 2023-06-18: qty 1

## 2023-06-18 MED ORDER — ACETAMINOPHEN 325 MG PO TABS: 650.0000 mg | ORAL_TABLET | ORAL | Status: DC | PRN

## 2023-06-18 MED ORDER — ASPIRIN 81 MG PO TBEC
81.0000 mg | DELAYED_RELEASE_TABLET | Freq: Every day | ORAL | Status: DC
  Administered 2023-06-19: 81 mg via ORAL
  Filled 2023-06-18: qty 1

## 2023-06-18 MED ORDER — ONDANSETRON HCL 4 MG/2ML IJ SOLN: 4.0000 mg | Freq: Four times a day (QID) | INTRAMUSCULAR | Status: DC | PRN

## 2023-06-18 MED ORDER — SODIUM CHLORIDE 0.9 % IV SOLN: 250.0000 mL | INTRAVENOUS | Status: DC | PRN

## 2023-06-18 MED ORDER — ESCITALOPRAM OXALATE 10 MG PO TABS
10.0000 mg | ORAL_TABLET | Freq: Every day | ORAL | Status: DC
  Administered 2023-06-18: 10 mg via ORAL
  Filled 2023-06-18: qty 1

## 2023-06-18 MED ORDER — SODIUM CHLORIDE 0.9% FLUSH: 3.0000 mL | INTRAVENOUS | Status: DC | PRN

## 2023-06-18 MED ORDER — IOHEXOL 350 MG/ML SOLN
75.0000 mL | Freq: Once | INTRAVENOUS | Status: AC | PRN
  Administered 2023-06-18: 75 mL via INTRAVENOUS

## 2023-06-18 MED ORDER — WARFARIN SODIUM 2.5 MG PO TABS
2.5000 mg | ORAL_TABLET | Freq: Once | ORAL | Status: AC
  Administered 2023-06-18: 2.5 mg via ORAL
  Filled 2023-06-18: qty 1

## 2023-06-18 MED ORDER — SODIUM CHLORIDE 0.9% FLUSH
3.0000 mL | Freq: Two times a day (BID) | INTRAVENOUS | Status: DC
Start: 2023-06-18 — End: 2023-06-19
  Administered 2023-06-18 – 2023-06-19 (×2): 3 mL via INTRAVENOUS

## 2023-06-18 MED ORDER — PANTOPRAZOLE SODIUM 40 MG PO TBEC
40.0000 mg | DELAYED_RELEASE_TABLET | Freq: Every evening | ORAL | Status: DC
  Administered 2023-06-18: 40 mg via ORAL
  Filled 2023-06-18: qty 1

## 2023-06-18 MED ORDER — FENTANYL CITRATE PF 50 MCG/ML IJ SOSY: 12.5000 ug | PREFILLED_SYRINGE | INTRAMUSCULAR | Status: DC | PRN

## 2023-06-18 MED ORDER — ALPRAZOLAM 1 MG PO TABS
1.0000 mg | ORAL_TABLET | Freq: Three times a day (TID) | ORAL | Status: DC | PRN
  Administered 2023-06-18 – 2023-06-19 (×3): 1 mg via ORAL
  Filled 2023-06-18 (×3): qty 1

## 2023-06-18 MED ORDER — ZOLPIDEM TARTRATE 5 MG PO TABS
5.0000 mg | ORAL_TABLET | Freq: Every evening | ORAL | Status: DC | PRN
  Administered 2023-06-18: 5 mg via ORAL
  Filled 2023-06-18: qty 1

## 2023-06-18 MED ORDER — AMIODARONE HCL 200 MG PO TABS
200.0000 mg | ORAL_TABLET | Freq: Two times a day (BID) | ORAL | Status: DC
  Administered 2023-06-18 – 2023-06-19 (×2): 200 mg via ORAL
  Filled 2023-06-18 (×2): qty 1

## 2023-06-18 MED ORDER — PERFLUTREN LIPID MICROSPHERE
1.0000 mL | INTRAVENOUS | Status: AC | PRN
  Administered 2023-06-18: 5 mL via INTRAVENOUS

## 2023-06-18 MED ORDER — FUROSEMIDE 10 MG/ML IJ SOLN
40.0000 mg | Freq: Once | INTRAMUSCULAR | Status: AC
  Administered 2023-06-18: 40 mg via INTRAVENOUS
  Filled 2023-06-18: qty 4

## 2023-06-18 MED ORDER — LISINOPRIL 10 MG PO TABS
20.0000 mg | ORAL_TABLET | Freq: Every day | ORAL | Status: DC
  Administered 2023-06-19: 20 mg via ORAL
  Filled 2023-06-18: qty 2

## 2023-06-18 MED ORDER — KETOROLAC TROMETHAMINE 15 MG/ML IJ SOLN
15.0000 mg | Freq: Four times a day (QID) | INTRAMUSCULAR | Status: DC | PRN
  Administered 2023-06-18: 15 mg via INTRAVENOUS
  Filled 2023-06-18: qty 1

## 2023-06-18 MED ORDER — OXYCODONE HCL 5 MG PO TABS
5.0000 mg | ORAL_TABLET | Freq: Four times a day (QID) | ORAL | Status: DC | PRN
  Administered 2023-06-18 – 2023-06-19 (×3): 5 mg via ORAL
  Filled 2023-06-18 (×3): qty 1

## 2023-06-18 MED ORDER — METOPROLOL TARTRATE 25 MG PO TABS
25.0000 mg | ORAL_TABLET | Freq: Two times a day (BID) | ORAL | Status: DC
Start: 2023-06-18 — End: 2023-06-19
  Administered 2023-06-18 – 2023-06-19 (×2): 25 mg via ORAL
  Filled 2023-06-18 (×2): qty 1

## 2023-06-18 MED ORDER — FUROSEMIDE 10 MG/ML IJ SOLN
40.0000 mg | Freq: Once | INTRAMUSCULAR | Status: AC
Start: 2023-06-18 — End: 2023-06-18
  Administered 2023-06-18: 40 mg via INTRAVENOUS
  Filled 2023-06-18: qty 4

## 2023-06-18 MED ORDER — FUROSEMIDE 10 MG/ML IJ SOLN: 40.0000 mg | Freq: Once | INTRAMUSCULAR | Status: DC

## 2023-06-18 MED ORDER — PROCHLORPERAZINE EDISYLATE 10 MG/2ML IJ SOLN: 10.0000 mg | INTRAMUSCULAR | Status: DC | PRN

## 2023-06-18 MED ORDER — LISINOPRIL 10 MG PO TABS: 20.0000 mg | ORAL_TABLET | Freq: Every day | ORAL | Status: DC

## 2023-06-18 MED ORDER — WARFARIN - PHARMACIST DOSING INPATIENT: Freq: Every day | Status: DC

## 2023-06-18 MED ORDER — ESCITALOPRAM OXALATE 10 MG PO TABS: 10.0000 mg | ORAL_TABLET | Freq: Every day | ORAL | Status: DC

## 2023-06-18 NOTE — ED Notes (Signed)
 Patient transported to CT

## 2023-06-18 NOTE — Progress Notes (Signed)
*  PRELIMINARY RESULTS* Echocardiogram 2D Echocardiogram has been performed with [facility].  Stacey Drain 06/18/2023, 1:05 PM

## 2023-06-18 NOTE — H&P (Addendum)
 History and Physical  [facility]  Chase Lee ZOX:[id number] DOB: 04/23/1980 DOA: 06/18/2023  PCP: [facility]  Patient coming from: Home  Level of care: Telemetry  I have personally briefly reviewed patient's old medical records in [facility]  Chief Complaint: chest pain  HPI: Chase Lee is a 43 year old gentleman with history of bicuspid aortic valve status post aortic valvuloplasty at age 66 and subsequent mechanical AVR at age 24 and recently status post ascending aortic aneurysm repair and mechanical valve replacement (Bentall procedure) by Dr. Laneta Simmers on 04/26/23, hypertension, postop atrial fibrillation, LBBB, acquired thrombophilia, GERD, anemia presented to the ED with chest pain and shortness of breath symptoms.  He said that he had been lifting a mattress yesterday and felt some right-sided chest pain.  He is having pain in his right shoulder.  Patient has had shortness of breath but no cough or fever.  He is also having some gastritis symptoms after eating pizza last night.  His workup in the ED positive for findings of acute heart failure with some pleural effusions and pulmonary edema.  He was given a dose of IV Lasix and started diuresing and is immediately starting to feel better.  He does not have a history of heart failure.  He was seen by the cardiologist and no plans for ischemic testing but he will need an echocardiogram and diuresis.  He was also noted to be anemic with a hemoglobin of 8.5 down from 11 last month.  His fecal occult blood testing was negative but patient reported having some dark tarry stools.  Past Medical History:  Diagnosis Date   Anxiety    History of artificial heart valve    a. s/p aortic valvuloplasty at age 74 b. subsequent mechanical AVR at 43 yo c. 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   HTN (hypertension) 04/05/2023     Past Surgical History:  Procedure Laterality Date   BENTALL PROCEDURE N/A 04/26/2023   Procedure: BENTALL PROCEDURE USING ON-X ASCENDING AORTIC PROSTHESIS SIZE ;  Surgeon: Alleen Borne, MD;  Location: [facility] OR;  Service: Open Heart Surgery;  Laterality: N/A;  CIRC ARREST   CARDIAC SURGERY     REDO STERNOTOMY N/A 04/26/2023   Procedure: REDO STERNOTOMY;  Surgeon: Alleen Borne, MD;  Location: [facility] OR;  Service: Open Heart Surgery;  Laterality: N/A;   REPLACEMENT ASCENDING AORTA N/A 04/26/2023   Procedure: REPLACEMENT ASCENDING AORTIC ANEURYSM USING HEMASHIELD PLATINUM GRAFT SIZE ;  Surgeon: Alleen Borne, MD;  Location: [facility] OR;  Service: Open Heart Surgery;  Laterality: N/A;   TEE WITHOUT CARDIOVERSION N/A 04/26/2023   Procedure: TRANSESOPHAGEAL ECHOCARDIOGRAM (TEE);  Surgeon: Alleen Borne, MD;  Location: [facility] OR;  Service: Open Heart Surgery;  Laterality: N/A;     reports that he quit smoking about 3 years ago. His smoking use included cigarettes. He started smoking about 2 months ago. He has a 0.1 pack-year smoking history. He has never been exposed to tobacco smoke. He has never used smokeless tobacco. He reports that he does not drink alcohol and does not use drugs.  No Known Allergies  Family History  Problem Relation Age of Onset   Depression Mother    Cancer Mother    Cancer Father     Prior to Admission medications   Medication Sig Start Date End Date Taking? Authorizing Provider  ALPRAZolam Prudy Feeler) 1 MG tablet  TAKE 1 TABLET(1 MG) BY MOUTH THREE TIMES DAILY AS NEEDED FOR ANXIETY 06/08/23   Mozingo, Thereasa Solo, NP  amiodarone (PACERONE) 200 MG tablet Take 1 tablet (200 mg total) by mouth 2 (two) times daily for 14 days, THEN 1 tablet (200 mg total) daily. 05/31/23 08/13/23  Ellsworth Lennox, PA-C  aspirin EC 81 MG tablet Take 1 tablet (81 mg total) by mouth daily. Swallow whole. 05/04/23   Leary Roca, PA-C  Compression Bandages KIT 1 each by Does not apply  route as needed (swelling). Adult compression stocking 15-20 adult size medium-large 03/05/23   Sharlene Dory, NP  escitalopram (LEXAPRO) 10 MG tablet TAKE 1 TABLET(10 MG) BY MOUTH DAILY 06/01/23   Mozingo, Thereasa Solo, NP  lisinopril (ZESTRIL) 20 MG tablet Take 1 tablet (20 mg total) by mouth daily. 06/07/23 09/05/23  Strader, Lennart Pall, PA-C  metoprolol tartrate (LOPRESSOR) 25 MG tablet Take 1 tablet (25 mg total) by mouth 2 (two) times daily. 04/05/23   Mallipeddi, Vishnu P, MD  omeprazole (PRILOSEC) 40 MG capsule Take 1 capsule (40 mg total) by mouth daily. 04/28/19   Avegno, Zachery Dakins, FNP  oxycodone (OXY-IR) 5 MG capsule Take 1 capsule (5 mg total) by mouth every 6 (six) hours as needed for pain. 05/21/23   Alleen Borne, MD  warfarin (COUMADIN) 5 MG tablet Take 1 tablet (5 mg total) by mouth daily. 05/04/23   Leary Roca, PA-C   Physical Exam: Vitals:   06/18/23 1045 06/18/23 1059 06/18/23 1100 06/18/23 1115  BP: (!) 168/94 (!) 160/81 (!) 148/80 (!) 129/102  Pulse: (!) 54 (!) 55 (!) 53 (!) 57  Resp: (!) 23 17 (!) 21 (!) 22  Temp:      TempSrc:      SpO2: 93% 92% 94% 90%   Constitutional: NAD, calm, comfortable Eyes: PERRL, lids and conjunctivae normal ENMT: Mucous membranes are moist. Posterior pharynx clear of any exudate or lesions. Normal dentition.  Neck: normal, supple, no masses, no thyromegaly Respiratory: clear to auscultation bilaterally, no wheezing, no crackles. Normal respiratory effort. No accessory muscle use.  Cardiovascular: normal s1, s2 sounds, systolic click murmur heard, no rubs / gallops. trace extremity edema. 2+ pedal pulses. No carotid bruits.  Abdomen: no tenderness, no masses palpated. No hepatosplenomegaly. Bowel sounds positive.  Musculoskeletal: no clubbing / cyanosis. No joint deformity upper and lower extremities. Good ROM, no contractures. Normal muscle tone.  Skin: no rashes, lesions, ulcers. No induration Neurologic: CN 2-12 grossly  intact. Sensation intact, DTR normal. Strength 5/5 in all 4.  Psychiatric: Normal judgment and insight. Alert and oriented x 3. Normal mood.   Labs on Admission: I have personally reviewed following labs and imaging studies  CBC: Recent Labs  Lab 06/18/23 0805 06/18/23 0824  WBC 6.8  --   NEUTROABS 5.2  --   HGB 8.5* 9.5*  HCT 27.8* 28.0*  MCV 80.6  --   PLT 261  --    Basic Metabolic Panel: Recent Labs  Lab 06/18/23 0805 06/18/23 0824  NA 136 140  K 3.8 3.9  CL 101 103  CO2 24  --   GLUCOSE 167* 152*  BUN 18 17  CREATININE 1.14 1.20  CALCIUM 8.6*  --    GFR: Estimated Creatinine Clearance: 89.1 mL/min (by C-G formula based on SCr of 1.2 mg/dL). Liver Function Tests: Recent Labs  Lab 06/18/23 0805  AST 25  ALT 29  ALKPHOS 94  BILITOT 0.5  PROT 6.8  ALBUMIN  3.7   Recent Labs  Lab 06/18/23 0805  LIPASE 25   No results for input(s): "AMMONIA" in the last 168 hours. Coagulation Profile: Recent Labs  Lab 06/18/23 0805  INR 2.7*   Cardiac Enzymes: No results for input(s): "CKTOTAL", "CKMB", "CKMBINDEX", "TROPONINI" in the last 168 hours. BNP (last 3 results) No results for input(s): "PROBNP" in the last 8760 hours. HbA1C: No results for input(s): "HGBA1C" in the last 72 hours. CBG: No results for input(s): "GLUCAP" in the last 168 hours. Lipid Profile: No results for input(s): "CHOL", "HDL", "LDLCALC", "TRIG", "CHOLHDL", "LDLDIRECT" in the last 72 hours. Thyroid Function Tests: No results for input(s): "TSH", "T4TOTAL", "FREET4", "T3FREE", "THYROIDAB" in the last 72 hours. Anemia Panel: No results for input(s): "VITAMINB12", "FOLATE", "FERRITIN", "TIBC", "IRON", "RETICCTPCT" in the last 72 hours. Urine analysis:    Component Value Date/Time   COLORURINE AMBER (A) 06/11/2021 0640   APPEARANCEUR CLEAR 06/11/2021 0640   LABSPEC 1.034 (H) 06/11/2021 0640   PHURINE 6.0 06/11/2021 0640   GLUCOSEU NEGATIVE 06/11/2021 0640   HGBUR SMALL (A) 06/11/2021  0640   BILIRUBINUR negative 01/26/2022 1552   KETONESUR negative 01/26/2022 1552   KETONESUR 5 (A) 06/11/2021 0640   PROTEINUR negative 01/26/2022 1552   PROTEINUR 30 (A) 06/11/2021 0640   UROBILINOGEN 0.2 01/26/2022 1552   NITRITE Negative 01/26/2022 1552   NITRITE NEGATIVE 06/11/2021 0640   LEUKOCYTESUR Negative 01/26/2022 1552   LEUKOCYTESUR NEGATIVE 06/11/2021 0640    Radiological Exams on Admission: DG Chest Portable 1 View Result Date: 06/18/2023 CLINICAL DATA:  Chest pain EXAM: PORTABLE CHEST 1 VIEW COMPARISON:  05/21/2023 FINDINGS: The cardio pericardial silhouette is enlarged. There is pulmonary vascular congestion without overt airspace pulmonary edema. No focal airspace consolidation or substantial pleural effusion. No acute bony abnormality. Telemetry leads overlie the chest. IMPRESSION: Enlargement of the cardiopericardial silhouette with pulmonary vascular congestion. Component of interstitial edema not excluded. Electronically Signed   By: Kennith Center M.D.   On: 06/18/2023 09:29   CT Angio Chest/Abd/Pel for Dissection W and/or Wo Contrast Result Date: 06/18/2023 CLINICAL DATA:  Acute aortic syndrome suspected, chest and shoulder pain for 1 week, recent aortic aneurysm repair EXAM: CT ANGIOGRAPHY CHEST, ABDOMEN AND PELVIS TECHNIQUE: Non-contrast CT of the chest was initially obtained. Multidetector CT imaging through the chest, abdomen and pelvis was performed using the standard protocol during bolus administration of intravenous contrast. Multiplanar reconstructed images and MIPs were obtained and reviewed to evaluate the vascular anatomy. RADIATION DOSE REDUCTION: This exam was performed according to the departmental dose-optimization program which includes automated exposure control, adjustment of the mA and/or kV according to patient size and/or use of iterative reconstruction technique. CONTRAST:  75mL OMNIPAQUE IOHEXOL 350 MG/ML SOLN COMPARISON:  CT chest angiogram, 04/19/2023  FINDINGS: CTA CHEST FINDINGS VASCULAR Aorta: Satisfactory opacification of the aorta. 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 Intermediate attenuation collection about the graft, without evidence of contrast opacification to suggest dehiscence (series 5, image 40). Mild enlargement of the remaining anatomic aortic arch measuring up to 3.5 cm in caliber, with normal caliber of the descending thoracic aorta. Cardiovascular: No evidence of pulmonary embolism on limited non-tailored examination. Global cardiomegaly. No pericardial effusion. Review of the MIP images confirms the above findings. NON VASCULAR Mediastinum/Nodes: Prominent subcentimeter mediastinal lymph nodes. Thyroid gland, trachea, and esophagus demonstrate no significant findings. Lungs/Pleura: Small bilateral pleural effusions and associated atelectasis or consolidation, with interlobular septal thickening throughout the lungs. Musculoskeletal: Status  post median sternotomy. No acute osseous findings. Review of the MIP images confirms the above findings. CTA ABDOMEN AND PELVIS FINDINGS VASCULAR Normal contour and caliber of the abdominal aorta. No evidence of aneurysm, dissection, or other acute aortic pathology. Standard branching pattern of the abdominal aorta with solitary bilateral renal arteries. 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 The vessel and its proximal branches remain opacified. Scattered atherosclerosis of the bilateral common iliac arteries. Review of the MIP images confirms the above findings. NON-VASCULAR Hepatobiliary: No solid liver abnormality is seen. Hepatic steatosis. No gallstones, gallbladder wall thickening, or biliary dilatation. Pancreas: Unremarkable. No pancreatic ductal dilatation or surrounding inflammatory changes. Spleen: Normal in size without  significant abnormality. Adrenals/Urinary Tract: Adrenal glands are unremarkable. Kidneys are normal, without renal calculi, solid lesion, or hydronephrosis. Bladder is unremarkable. Stomach/Bowel: Stomach is within normal limits. Appendix appears normal. No evidence of bowel wall thickening, distention, or inflammatory changes. Lymphatic: No enlarged abdominal or pelvic lymph nodes. Reproductive: No mass or other significant abnormality. Other: No abdominal wall hernia. Mild anasarca. Small volume simple fluid attenuation ascites. Musculoskeletal: No acute osseous findings. IMPRESSION: 1. 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 Intermediate attenuation collection about the graft, most likely postoperative, without evidence of contrast opacification to suggest dehiscence. 2. Mild enlargement of the remaining anatomic aortic arch measuring up to 3.5 cm in caliber, with normal caliber of the descending thoracic aorta. 3. Normal contour and caliber of the abdominal aorta. No evidence of aneurysm, dissection, or other acute aortic pathology. 4. 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 The vessel and its proximal branches remain opacified. 5. Small bilateral pleural effusions and pulmonary edema. Small volume ascites and anasarca. 6. Global cardiomegaly. 7. Hepatic steatosis. Electronically Signed   By: Jearld Lesch M.D.   On: 06/18/2023 09:23   EKG: Independently reviewed. Sinus rhythm  Assessment/Plan Principal Problem:   Atypical chest pain Active Problems:   Acute heart failure (HCC)   Acquired thrombophilia (HCC)   Disorder of hepatic portal vein/Small amount of portal venous gas in the left lobe of the liver   Status post mechanical aortic valve replacement at Age 51   HTN (hypertension)   GERD (gastroesophageal reflux disease)   Paroxysmal atrial fibrillation (HCC)   Anemia    Acute Heart Failure unspecified - new diagnosis - follow up 2D echocardiogram pending - excellent response so far to IV furosemide ordered by cardiology - recheck portable CXR in AM  - follow intake/output, weights,  - obtain ReDs vest reading - follow electrolytes - potassium chloride 40 meq x 1 dose ordered   Essential hypertension - resumed home blood pressure medications  Atypical Chest Pain - secondary to acute heart failure - symptoms resolving with diuresis  Normocytic anemia - Hg down from 11 to 8 in 1 month - he reports intermittent bouts of melena - he is fully anticoagulated with warfarin with a mechanical valve  - stool guaiac was negative today per ED provider - given he will need to continue anticoagulation longterm will get GI involved in case he has a treatable bleeding lesion - consultation to GI team requested - continue pantoprazole   GERD - exacerbated by pizza meal yesterday - pantoprazole ordered for GI protection   Acquired thrombophilia - warfarin per pharm D managing - goal INR 2.5-3.5 - consulted  pharm D for warfarin dosing in hospital  Paroxysmal Atrial Fibrillation - resume home amiodarone 200 mg BID - resume home metoprolol 25 mg BID - remains on warfarin for full anticoagulation   GAD - resumed home anxiolytics   Time spent: 62 mins  DVT prophylaxis: warfarin   Code Status: Full   Family Communication:   Disposition Plan:   Consults called: cardiology   Admission status: OBV   Level of care: Telemetry Shritha Bresee MD [facility] How to contact the [facility] Attending or Consulting provider 7A - 7P or covering provider during after hours 7P -7A, for this patient?  Check the care team in [facility] and look for a) attending/consulting [facility] provider listed and b) the [facility] team listed Log into [email or web address] and use [place]'s universal password to access. If you do not have the password, please contact the hospital operator. Locate  the [facility] provider you are looking for under [facility] and page to a number that you can be directly reached. If you still have difficulty reaching the provider, please page the [facility] (Director on Call) for the Hospitalists listed on amion for assistance.   If 7PM-7AM, please contact night-coverage [email or web address] Password [facility]  06/18/2023, 11:48 AM

## 2023-06-18 NOTE — ED Notes (Signed)
 Pt endorses he is on a beta blocker, HR 50 at this time

## 2023-06-18 NOTE — ED Notes (Signed)
 Echo at bedside

## 2023-06-18 NOTE — ED Notes (Signed)
 Pt reports black, tarry stool x 2-3 days

## 2023-06-18 NOTE — ED Notes (Signed)
 Pts echo complete

## 2023-06-18 NOTE — ED Notes (Signed)
 Pt given urinal.

## 2023-06-18 NOTE — ED Notes (Signed)
Pt uses urinal.

## 2023-06-18 NOTE — Progress Notes (Addendum)
 PHARMACY - ANTICOAGULATION CONSULT NOTE  Pharmacy Consult for warfarin Indication: atrial fibrillation/aortic valve replacement   No Known Allergies  Patient Measurements:    Vital Signs: Temp: 98.8 F (37.1 C) (03/31 0745) Temp Source: Oral (03/31 0745) BP: 129/102 (03/31 1115) Pulse Rate: 57 (03/31 1115)  Labs: Recent Labs    06/18/23 0805 06/18/23 0824 06/18/23 0953  HGB 8.5* 9.5*  --   HCT 27.8* 28.0*  --   PLT 261  --   --   LABPROT 28.7*  --   --   INR 2.7*  --   --   CREATININE 1.14 1.20  --   TROPONINIHS 26*  --  27*    Estimated Creatinine Clearance: 89.1 mL/min (by C-G formula based on SCr of 1.2 mg/dL).   Medical History: Past Medical History:  Diagnosis Date   Anxiety    History of artificial heart valve    a. s/p aortic valvuloplasty at age 68 b. subsequent mechanical AVR at 43 yo c. 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   HTN (hypertension) 04/05/2023    Medications:  (Not in a hospital admission)   Assessment: Pharmacy consulted to dose warfarin in patient with atrial fibrillation and aortic valve replacement.  Patient's INR on admission is therapeutic at 2.7 with last dose unknown at this time.  Home regimen listed as 5 mg every Sun and 2.5 mg ROW.  Hgb 9.5  Patient is on amiodarone prior to admission since February 2025  Goal of Therapy:  INR 2-3 Monitor platelets by anticoagulation protocol: Yes   Plan:  Warfarin 2.5 mg x 1 dose. Monitor daily INR and s/s of bleeding.  Judeth Cornfield, PharmD Clinical Pharmacist 06/18/2023 11:56 AM

## 2023-06-18 NOTE — ED Triage Notes (Signed)
 Pt c/o upper chest pain, near bilateral should area x 1 week, states it is aching in nature. Also c/o mis sternal pain x last night that feels like "tightness". 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   Also endorses shob. Pt 97% RA

## 2023-06-18 NOTE — Plan of Care (Signed)
   Problem: Education: Goal: Knowledge of General Education information will improve Description: Including pain rating scale, medication(s)/side effects and non-pharmacologic comfort measures Outcome: Progressing   Problem: Clinical Measurements: Goal: Ability to maintain clinical measurements within normal limits will improve Outcome: Progressing Goal: Diagnostic test results will improve Outcome: Progressing   Problem: Activity: Goal: Risk for activity intolerance will decrease Outcome: Progressing   Problem: Coping: Goal: Level of anxiety will decrease Outcome: Progressing

## 2023-06-18 NOTE — Hospital Course (Signed)
 43 year old gentleman with history of bicuspid aortic valve status post aortic valvuloplasty at age 41 and subsequent mechanical AVR at age 10 and recently status post ascending aortic aneurysm repair and mechanical valve replacement (Bentall procedure) by Dr. Laneta Simmers on 04/26/23, hypertension, postop atrial fibrillation, LBBB, acquired thrombophilia, GERD, anemia presented to the ED with chest pain and shortness of breath symptoms.  He said that he had been lifting a mattress yesterday and felt some right-sided chest pain.  He is having pain in his right shoulder.  Patient has had shortness of breath but no cough or fever.  He is also having some gastritis symptoms after eating pizza last night.  His workup in the ED positive for findings of acute heart failure with some pleural effusions and pulmonary edema.  He was given a dose of IV Lasix and started diuresing and is immediately starting to feel better.  He does not have a history of heart failure.  He was seen by the cardiologist and no plans for ischemic testing but he will need an echocardiogram and diuresis.  He was also noted to be anemic with a hemoglobin of 8.5 down from 11 last month.  His fecal occult blood testing was negative but patient reported having some dark tarry stools.

## 2023-06-18 NOTE — Consult Note (Signed)
 Cardiology Consultation   Patient ID: Chase Lee MRN: [id number]; DOB: 1980-04-10  Admit date: 06/18/2023 Date of Consult: 06/18/2023  PCP:  [facility]   [facility] Providers Cardiologist:  Marjo Bicker, MD        Patient Profile:   Chase Lee is a 43 y.o. male with a hx of  bicuspid aortic valve with valvuloplasty at age 78 followed by mechanical AVR at age 53. History of aortic aneurysm with recent replacement of the ascending aorta (hemi-arch) using a 32 mm Hemashield graft 04/2023, at that time repeat AVR with a 25 mm on X mechanical AV who is being seen 06/18/2023 for the evaluation of chest pain at the request of Dr Criss Alvine.  History of Present Illness:   Chase Lee 43 yo male history of bicuspid aortic valve with valvuloplasty at age 87 followed by mechanical AVR at age 35. History of aortic aneurysm with recent replacement of the ascending aorta (hemi-arch) using a 32 mm Hemashield graft 04/2023, at that time repeat AVR with a 25 mm on X mechanical AV. Following surgery issues with postop afib.   Reports gradually progressing DOE over the last 1.5 weeks. Noticed in rehab with exercising, noticed at church while walking up an incline trouble keeping up with his family. Has noted some LE edema, abdomen has felt distended to him at home. Reports he has some prn lasix at home but has not been taking.   Right shoulder pain started while lifting a mattress. Pain worst with palpation, movement of shoudler.    WBC 6.8 Hgb 8.5 Plt 261 BNP 616 K 3.8 Cr 1.14 INR 2.7 Lipase 25 FOBT neg Trop 26--> EKG Sinus brady 54, chronic LBBB CXR pulm congestion CTA C/A/P: normal aortic graft, high grade SMA stenosis, small bilateral effusions, small volume ascites  Jan 2025 echo: LVEF 60-65%, no WMAs, indet diastolic, normal AVR, aortic anerusym root 62 mm and ascending 77 mm.   04/2023 cardiac CT: type A dissection of aortic root, ascedning aortic  dissection. Dilated ascending aorta 74 mm, mild CAD ostial LM 25-49%, coronary calcium score 0  Past Medical History:  Diagnosis Date   Anxiety    History of artificial heart valve    a. s/p aortic valvuloplasty at age 50 b. subsequent mechanical AVR at 43 yo c. 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   HTN (hypertension) 04/05/2023    Past Surgical History:  Procedure Laterality Date   BENTALL PROCEDURE N/A 04/26/2023   Procedure: BENTALL PROCEDURE USING ON-X ASCENDING AORTIC PROSTHESIS SIZE ;  Surgeon: Alleen Borne, MD;  Location: [facility] OR;  Service: Open Heart Surgery;  Laterality: N/A;  CIRC ARREST   CARDIAC SURGERY     REDO STERNOTOMY N/A 04/26/2023   Procedure: REDO STERNOTOMY;  Surgeon: Alleen Borne, MD;  Location: [facility] OR;  Service: Open Heart Surgery;  Laterality: N/A;   REPLACEMENT ASCENDING AORTA N/A 04/26/2023   Procedure: REPLACEMENT ASCENDING AORTIC ANEURYSM USING HEMASHIELD PLATINUM GRAFT SIZE ;  Surgeon: Alleen Borne, MD;  Location: [facility] OR;  Service: Open Heart Surgery;  Laterality: N/A;   TEE WITHOUT CARDIOVERSION N/A 04/26/2023   Procedure: TRANSESOPHAGEAL ECHOCARDIOGRAM (TEE);  Surgeon: Alleen Borne, MD;  Location: [facility] OR;  Service: Open Heart Surgery;  Laterality: N/A;      Inpatient Medications: Scheduled Meds:  furosemide  40 mg Intravenous Once   Continuous Infusions:  PRN Meds:   Allergies:   No Known Allergies  Social History:   Social History   Socioeconomic History   Marital status: Married    Spouse name: Not on file   Number of children: 0   Years of education: Not on file   Highest education level: Not on file  Occupational History   Occupation: Sales    Employer: [employer]  Tobacco Use   Smoking status: Former    Current packs/day: 0.50    Average packs/day: 0.5 packs/day for 0.2 years (0.1 ttl pk-yrs)    Types: Cigarettes    Start date: 04/06/2023     Quit date: 08/10/2019    Passive exposure: Never   Smokeless tobacco: Never   Tobacco comments:    Tries to chew nicotine gum at work - smoking 3 ciggs per day   Vaping Use   Vaping status: Never Used  Substance and Sexual Activity   Alcohol use: Never   Drug use: Never   Sexual activity: Yes    Birth control/protection: None  Other Topics Concern   Not on file  Social History Narrative   Not on file   Social Drivers of Health   Financial Resource Strain: Not on file  Food Insecurity: No Food Insecurity (04/20/2023)   Hunger Vital Sign    Worried About Running Out of Food in the Last Year: Never true    Ran Out of Food in the Last Year: Never true  Transportation Needs: No Transportation Needs (04/20/2023)   PRAPARE - Administrator, Civil Service (Medical): No    Lack of Transportation (Non-Medical): No  Physical Activity: Not on file  Stress: Not on file  Social Connections: Socially Integrated (04/20/2023)   Social Connection and Isolation Panel [NHANES]    Frequency of Communication with Friends and Family: More than three times a week    Frequency of Social Gatherings with Friends and Family: More than three times a week    Attends Religious Services: 1 to 4 times per year    Active Member of [employer] or Organizations: Yes    Attends Banker Meetings: 1 to 4 times per year    Marital Status: Married  Catering manager Violence: Not At Risk (04/20/2023)   Humiliation, Afraid, Rape, and Kick questionnaire    Fear of Current or Ex-Partner: No    Emotionally Abused: No    Physically Abused: No    Sexually Abused: No    Family History:    Family History  Problem Relation Age of Onset   Depression Mother    Cancer Mother    Cancer Father      ROS:  Please see the history of present illness.   All other ROS reviewed and negative.     Physical Exam/Data:   Vitals:   06/18/23 0745 06/18/23 0750 06/18/23 0754 06/18/23 0830  BP: (!) 159/77    (!) 161/68  Pulse: (!) 53 (!) 53  (!) 50  Resp: 16 (!) 24  (!) 21  Temp: 98.8 F (37.1 C)     TempSrc: Oral     SpO2: 97% 95% 97% 91%    Intake/Output Summary (Last 24 hours) at 06/18/2023 1003 Last data filed at 06/18/2023 0800 Gross per 24 hour  Intake --  Output 300 ml  Net -300 ml      06/06/2023    3:22 PM 06/06/2023    9:44 AM 05/31/2023    3:35 PM  Last 3 Weights  Weight (lbs) 228 lb 230 lb 13.2 oz 228 lb 3.2 oz  Weight (kg) 103.42 kg 104.7 kg 103.511 kg     There is no height or weight on file to calculate BMI.  General:  Well nourished, well developed, in no acute distress HEENT: normal Neck: + JVD Vascular: No carotid bruits; Distal pulses 2+ bilaterally Cardiac:  RRR, mechanical S2, 2/6 systolic murmur rusb Lungs:  decreased breath sounds bilateral bases Abd: soft, nontender, no hepatomegaly  WGN:FAOZH bilateral edema Musculoskeletal:  No deformities, BUE and BLE strength normal and equal Skin: warm and dry  Neuro:  CNs 2-12 intact, no focal abnormalities noted Psych:  Normal affect     Laboratory Data:  High Sensitivity Troponin:   Recent Labs  Lab 06/18/23 0805  TROPONINIHS 26*     Chemistry Recent Labs  Lab 06/18/23 0805 06/18/23 0824  NA 136 140  K 3.8 3.9  CL 101 103  CO2 24  --   GLUCOSE 167* 152*  BUN 18 17  CREATININE 1.14 1.20  CALCIUM 8.6*  --   GFRNONAA >60  --   ANIONGAP 11  --     Recent Labs  Lab 06/18/23 0805  PROT 6.8  ALBUMIN 3.7  AST 25  ALT 29  ALKPHOS 94  BILITOT 0.5   Lipids No results for input(s): "CHOL", "TRIG", "HDL", "LABVLDL", "LDLCALC", "CHOLHDL" in the last 168 hours.  Hematology Recent Labs  Lab 06/18/23 0805 06/18/23 0824  WBC 6.8  --   RBC 3.45*  --   HGB 8.5* 9.5*  HCT 27.8* 28.0*  MCV 80.6  --   MCH 24.6*  --   MCHC 30.6  --   RDW 16.5*  --   PLT 261  --    Thyroid No results for input(s): "TSH", "FREET4" in the last 168 hours.  BNP Recent Labs  Lab 06/18/23 0905  BNP 616.0*     DDimer No results for input(s): "DDIMER" in the last 168 hours.   Radiology/Studies:  DG Chest Portable 1 View Result Date: 06/18/2023 CLINICAL DATA:  Chest pain EXAM: PORTABLE CHEST 1 VIEW COMPARISON:  05/21/2023 FINDINGS: The cardio pericardial silhouette is enlarged. There is pulmonary vascular congestion without overt airspace pulmonary edema. No focal airspace consolidation or substantial pleural effusion. No acute bony abnormality. Telemetry leads overlie the chest. IMPRESSION: Enlargement of the cardiopericardial silhouette with pulmonary vascular congestion. Component of interstitial edema not excluded. Electronically Signed   By: Kennith Center M.D.   On: 06/18/2023 09:29   CT Angio Chest/Abd/Pel for Dissection W and/or Wo Contrast Result Date: 06/18/2023 CLINICAL DATA:  Acute aortic syndrome suspected, chest and shoulder pain for 1 week, recent aortic aneurysm repair EXAM: CT ANGIOGRAPHY CHEST, ABDOMEN AND PELVIS TECHNIQUE: Non-contrast CT of the chest was initially obtained. Multidetector CT imaging through the chest, abdomen and pelvis was performed using the standard protocol during bolus administration of intravenous contrast. Multiplanar reconstructed images and MIPs were obtained and reviewed to evaluate the vascular anatomy. RADIATION DOSE REDUCTION: This exam was performed according to the departmental dose-optimization program which includes automated exposure control, adjustment of the mA and/or kV according to patient size and/or use of iterative reconstruction technique. CONTRAST:  75mL OMNIPAQUE IOHEXOL 350 MG/ML SOLN COMPARISON:  CT chest angiogram, 04/19/2023 FINDINGS: CTA CHEST FINDINGS VASCULAR Aorta: Satisfactory opacification of the aorta. 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 Intermediate attenuation collection about the graft, without evidence of contrast opacification to suggest  dehiscence (series 5, image  40). Mild enlargement of the remaining anatomic aortic arch measuring up to 3.5 cm in caliber, with normal caliber of the descending thoracic aorta. Cardiovascular: No evidence of pulmonary embolism on limited non-tailored examination. Global cardiomegaly. No pericardial effusion. Review of the MIP images confirms the above findings. NON VASCULAR Mediastinum/Nodes: Prominent subcentimeter mediastinal lymph nodes. Thyroid gland, trachea, and esophagus demonstrate no significant findings. Lungs/Pleura: Small bilateral pleural effusions and associated atelectasis or consolidation, with interlobular septal thickening throughout the lungs. Musculoskeletal: Status post median sternotomy. No acute osseous findings. Review of the MIP images confirms the above findings. CTA ABDOMEN AND PELVIS FINDINGS VASCULAR Normal contour and caliber of the abdominal aorta. No evidence of aneurysm, dissection, or other acute aortic pathology. Standard branching pattern of the abdominal aorta with solitary bilateral renal arteries. 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 The vessel and its proximal branches remain opacified. Scattered atherosclerosis of the bilateral common iliac arteries. Review of the MIP images confirms the above findings. NON-VASCULAR Hepatobiliary: No solid liver abnormality is seen. Hepatic steatosis. No gallstones, gallbladder wall thickening, or biliary dilatation. Pancreas: Unremarkable. No pancreatic ductal dilatation or surrounding inflammatory changes. Spleen: Normal in size without significant abnormality. Adrenals/Urinary Tract: Adrenal glands are unremarkable. Kidneys are normal, without renal calculi, solid lesion, or hydronephrosis. Bladder is unremarkable. Stomach/Bowel: Stomach is within normal limits. Appendix appears normal. No evidence of bowel wall thickening, distention, or inflammatory changes.  Lymphatic: No enlarged abdominal or pelvic lymph nodes. Reproductive: No mass or other significant abnormality. Other: No abdominal wall hernia. Mild anasarca. Small volume simple fluid attenuation ascites. Musculoskeletal: No acute osseous findings. IMPRESSION: 1. 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 Intermediate attenuation collection about the graft, most likely postoperative, without evidence of contrast opacification to suggest dehiscence. 2. Mild enlargement of the remaining anatomic aortic arch measuring up to 3.5 cm in caliber, with normal caliber of the descending thoracic aorta. 3. Normal contour and caliber of the abdominal aorta. No evidence of aneurysm, dissection, or other acute aortic pathology. 4. 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 The vessel and its proximal branches remain opacified. 5. Small bilateral pleural effusions and pulmonary edema. Small volume ascites and anasarca. 6. Global cardiomegaly. 7. Hepatic steatosis. Electronically Signed   By: Jearld Lesch M.D.   On: 06/18/2023 09:23     Assessment and Plan:   1.History of bicuspid aortic valve/Aortic aneurysm - valvuloplasty age 29, initial mechanical AVR age 62 - 04/2022 replacement of the ascending aorta (hemi-arch) using a 32 mm Hemashield graft along with repeat AVR 25 mm on X mechanical AV -- presents with noncardiac chest pain, SOB/DOE.  -CTA shows stable aortic repair - echo is pending - continue coumadin/ASA in setting of mechanical valve  2. Chest pain - noncardiac chest pain of right shoulder, started while lifting a mattress, tender to palpation and position.  - Preop 04/2023 CTA with just mild coronary disease.  - CTA of aorta in ER shows stable aortic repair - EKG chronic LBBB, trop 26 mild so far. Mild trop notes from labs 3 years ago, may be somewhat chronic, also some signs of fluid  overload as well as anemia - no plans for ischemic testing.    3.Post op afib - occurred after recent AVR/aortic aneurysm repair. Some prolonged issues with afib at  outpatient cards f/u - has been on amiodarone as outpatient - he is in SR today - continue home amio, toprol, coumadin.    4.Anemia - Hgb 8.5 on admission, down from 11 last month - per primary team, FOBT negative however reports some dark tarry stools  5. HTN  6. Heart failure, unknown type - CXR with vascular congestion - CT with small bilateral effusions and pulm edema. BNP 600s - volume overloaded on exam with mild LE edema, decrease breath sounds bilateral lung bases, elevated JVD - f/u echo - dose IV lasix 40mg  x 2 today  and follow response - f/u echo   For questions or updates, please contact [facility] Please consult [email or web address] for contact info under    Signed, Dina Rich, MD  06/18/2023 10:03 AM

## 2023-06-18 NOTE — Consult Note (Signed)
 Gastroenterology Consult   Referring Provider: Dr. Laural Benes  Primary Care Physician:  [facility] Primary Gastroenterologist:  Dr. Levon Hedger   Patient ID: Chase Lee; [id number]; 1980-09-29   Admit date: 06/18/2023  LOS: 0 days   Date of Consultation: 06/18/2023  Reason for Consultation:  worsening anemia   History of Present Illness   Chase Lee is a 43 y.o. year old male with a history of mechanical AVR on warfarin, ascending aortic aneurysm repair Feb 2025, HTN, afib, LBB, GERD, anemia, hepatic steatosis, gastroenteritis in 2023 and concern for portal venous gas, prior concern for mass in ascending colon in March 2023 during hospitalization for gastroenteritis, presenting to the ED today with chest pain, SOB, and admitted with acute heart failure. He was found to have worsening anemia but heme negative stools. GI has been requested.   Pertinent labs in ED: Hgb 8.5, down from 11 in FEb 2025. Baseline appears 10/11 range. Microcytic. INR 2.7. troponin 26, 27. BNP 616, iron low at 17, ferritin 11.   Imaging on admission: CTA chest/abd/pelvis interval Bentall type graft repair of tubular ascending thoractic aorta with aortic valve prostensis, intermediate attenuation collection about the graft, most likely postoperative, no contrast opacification to suggest dehiscence, additional findings as below. ECHO pending.    Today:  Has seen some darker stool in mornings. In evenings, stomach has been tight and bloated. Last episode dark stool this morning. He denies this as black, stating it is the color of the frames in the room but a bit darker (dark brown).  No hematochezia. No changes in bowel habits. No diarrhea. +GERD chronic. Takes omeprazole. No dysphagia.   SOB when presenting today. No NSAIDs. No aspirin powders.   When last seen as outpatient April 2023, plans had been for colonoscopy due to concern for possible mass in ascending colon. He had been  hospitalized in March 2023 with gastroenteritis (norovirus) and had noted to have free air near the portal area. No concern for perforated viscus.     No prior EGD or colonoscopy. No FH colon cancer or polyps.   Past Medical History:  Diagnosis Date   Anxiety    History of artificial heart valve    a. s/p aortic valvuloplasty at age 15 b. subsequent mechanical AVR at 43 yo c. 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   HTN (hypertension) 04/05/2023    Past Surgical History:  Procedure Laterality Date   BENTALL PROCEDURE N/A 04/26/2023   Procedure: BENTALL PROCEDURE USING ON-X ASCENDING AORTIC PROSTHESIS SIZE ;  Surgeon: Alleen Borne, MD;  Location: [facility] OR;  Service: Open Heart Surgery;  Laterality: N/A;  [facility] ARREST   CARDIAC SURGERY     REDO STERNOTOMY N/A 04/26/2023   Procedure: REDO STERNOTOMY;  Surgeon: Alleen Borne, MD;  Location: [facility] OR;  Service: Open Heart Surgery;  Laterality: N/A;   REPLACEMENT ASCENDING AORTA N/A 04/26/2023   Procedure: REPLACEMENT ASCENDING AORTIC ANEURYSM USING HEMASHIELD PLATINUM GRAFT SIZE ;  Surgeon: Alleen Borne, MD;  Location: [facility] OR;  Service: Open Heart Surgery;  Laterality: N/A;   TEE WITHOUT CARDIOVERSION N/A 04/26/2023   Procedure: TRANSESOPHAGEAL ECHOCARDIOGRAM (TEE);  Surgeon: Alleen Borne, MD;  Location: [facility] OR;  Service: Open Heart Surgery;  Laterality: N/A;    Prior to Admission medications   Medication Sig Start Date End Date Taking? Authorizing Provider  ALPRAZolam Prudy Feeler) 1 MG tablet TAKE 1  TABLET(1 MG) BY MOUTH THREE TIMES DAILY AS NEEDED FOR ANXIETY 06/08/23   Mozingo, Thereasa Solo, NP  amiodarone (PACERONE) 200 MG tablet Take 1 tablet (200 mg total) by mouth 2 (two) times daily for 14 days, THEN 1 tablet (200 mg total) daily. 05/31/23 08/13/23  Ellsworth Lennox, PA-C  aspirin EC 81 MG tablet Take 1 tablet (81 mg total) by mouth daily. Swallow  whole. 05/04/23   Leary Roca, PA-C  Compression Bandages KIT 1 each by Does not apply route as needed (swelling). Adult compression stocking 15-20 adult size medium-large 03/05/23   Sharlene Dory, NP  escitalopram (LEXAPRO) 10 MG tablet TAKE 1 TABLET(10 MG) BY MOUTH DAILY 06/01/23   Mozingo, Thereasa Solo, NP  lisinopril (ZESTRIL) 20 MG tablet Take 1 tablet (20 mg total) by mouth daily. 06/07/23 09/05/23  Strader, Lennart Pall, PA-C  metoprolol tartrate (LOPRESSOR) 25 MG tablet Take 1 tablet (25 mg total) by mouth 2 (two) times daily. 04/05/23   Mallipeddi, Vishnu P, MD  omeprazole (PRILOSEC) 40 MG capsule Take 1 capsule (40 mg total) by mouth daily. 04/28/19   Avegno, Zachery Dakins, FNP  oxycodone (OXY-IR) 5 MG capsule Take 1 capsule (5 mg total) by mouth every 6 (six) hours as needed for pain. 05/21/23   Alleen Borne, MD  warfarin (COUMADIN) 5 MG tablet Take 1 tablet (5 mg total) by mouth daily. 05/04/23   Leary Roca, PA-C    Current Facility-Administered Medications  Medication Dose Route Frequency Provider Last Rate Last Admin   0.9 %  sodium chloride infusion  250 mL Intravenous PRN Johnson, Clanford L, MD       acetaminophen (TYLENOL) tablet 650 mg  650 mg Oral Q4H PRN Johnson, Clanford L, MD       ALPRAZolam Prudy Feeler) tablet 1 mg  1 mg Oral TID PRN Laural Benes, Clanford L, MD       amiodarone (PACERONE) tablet 200 mg  200 mg Oral BID Johnson, Clanford L, MD       [START ON 06/19/2023] aspirin EC tablet 81 mg  81 mg Oral Daily Johnson, Clanford L, MD       escitalopram (LEXAPRO) tablet 10 mg  10 mg Oral Daily Johnson, Clanford L, MD       fentaNYL (SUBLIMAZE) injection 12.5 mcg  12.5 mcg Intravenous Q2H PRN Johnson, Clanford L, MD       furosemide (LASIX) injection 40 mg  40 mg Intravenous Once Johnson, Clanford L, MD       ketorolac (TORADOL) 15 MG/ML injection 15 mg  15 mg Intravenous Q6H PRN Johnson, Clanford L, MD       lisinopril (ZESTRIL) tablet 20 mg  20 mg Oral Daily Johnson,  Clanford L, MD       metoprolol tartrate (LOPRESSOR) tablet 25 mg  25 mg Oral BID Johnson, Clanford L, MD       ondansetron (ZOFRAN) injection 4 mg  4 mg Intravenous Q6H PRN Johnson, Clanford L, MD       oxyCODONE (Oxy IR/ROXICODONE) immediate release tablet 5 mg  5 mg Oral Q6H PRN Johnson, Clanford L, MD       pantoprazole (PROTONIX) EC tablet 40 mg  40 mg Oral QPM Johnson, Clanford L, MD       perflutren lipid microspheres (DEFINITY) IV suspension  1-10 mL Intravenous PRN Antoine Poche, MD   5 mL at 06/18/23 1149   potassium chloride SA (KLOR-CON M) CR tablet 40 mEq  40 mEq Oral Once  Johnson, Clanford L, MD       prochlorperazine (COMPAZINE) injection 10 mg  10 mg Intravenous Q4H PRN Johnson, Clanford L, MD       sodium chloride flush (NS) 0.9 % injection 3 mL  3 mL Intravenous Q12H Johnson, Clanford L, MD       sodium chloride flush (NS) 0.9 % injection 3 mL  3 mL Intravenous PRN Johnson, Clanford L, MD       warfarin (COUMADIN) tablet 2.5 mg  2.5 mg Oral ONCE-1600 Johnson, Clanford L, MD       Warfarin - Pharmacist Dosing Inpatient   Does not apply q1600 Johnson, Clanford L, MD       zolpidem (AMBIEN) tablet 5 mg  5 mg Oral QHS PRN,MR X 1 Johnson, Clanford L, MD        Allergies as of 06/18/2023   (No Known Allergies)    Family History  Problem Relation Age of Onset   Depression Mother    Cancer Mother    Cancer Father     Social History   Socioeconomic History   Marital status: Married    Spouse name: Not on file   Number of children: 0   Years of education: Not on file   Highest education level: Not on file  Occupational History   Occupation: Investment banker, corporate: [employer]  Tobacco Use   Smoking status: Former    Current packs/day: 0.50    Average packs/day: 0.5 packs/day for 0.2 years (0.1 ttl pk-yrs)    Types: Cigarettes    Start date: 04/06/2023    Quit date: 08/10/2019    Passive exposure: Never   Smokeless tobacco: Never   Tobacco comments:    Tries to chew  nicotine gum at work - smoking 3 ciggs per day   Vaping Use   Vaping status: Never Used  Substance and Sexual Activity   Alcohol use: Never   Drug use: Never   Sexual activity: Yes    Birth control/protection: None  Other Topics Concern   Not on file  Social History Narrative   Not on file   Social Drivers of Health   Financial Resource Strain: Not on file  Food Insecurity: No Food Insecurity (04/20/2023)   Hunger Vital Sign    Worried About Running Out of Food in the Last Year: Never true    Ran Out of Food in the Last Year: Never true  Transportation Needs: No Transportation Needs (04/20/2023)   PRAPARE - Administrator, Civil Service (Medical): No    Lack of Transportation (Non-Medical): No  Physical Activity: Not on file  Stress: Not on file  Social Connections: Socially Integrated (04/20/2023)   Social Connection and Isolation Panel [NHANES]    Frequency of Communication with Friends and Family: More than three times a week    Frequency of Social Gatherings with Friends and Family: More than three times a week    Attends Religious Services: 1 to 4 times per year    Active Member of [employer] or Organizations: Yes    Attends Banker Meetings: 1 to 4 times per year    Marital Status: Married  Catering manager Violence: Not At Risk (04/20/2023)   Humiliation, Afraid, Rape, and Kick questionnaire    Fear of Current or Ex-Partner: No    Emotionally Abused: No    Physically Abused: No    Sexually Abused: No     Review of Systems   Gen:  see HPI CV: see HPI Resp: see HPI GI: Denies vomiting blood, jaundice, and fecal incontinence.   Denies dysphagia or odynophagia. GU : Denies urinary burning, blood in urine, urinary frequency, and urinary incontinence. MS: Denies joint pain, limitation of movement, swelling, cramps, and atrophy.  Derm: Denies rash, itching, dry skin, hives. Psych: Denies depression, anxiety, memory loss, hallucinations, and  confusion. Heme: Denies bruising or bleeding Neuro:  Denies any headaches, dizziness, paresthesias, shaking  Physical Exam   Vital Signs in last 24 hours: Temp:  [98.7 F (37.1 C)-98.8 F (37.1 C)] 98.7 F (37.1 C) (03/31 1335) Pulse Rate:  [50-57] 54 (03/31 1335) Resp:  [16-24] 17 (03/31 1335) BP: (129-181)/(68-102) 163/84 (03/31 1335) SpO2:  [90 %-97 %] 94 % (03/31 1335)    General:   Alert,  Well-developed, well-nourished, pleasant and cooperative in NAD Head:  Normocephalic and atraumatic. Eyes:  Sclera clear, no icterus.    Ears:  Normal auditory acuity. Mouth:  No deformity or lesions, dentition normal. Lungs:  Clear throughout to auscultation.    Heart:  S1 S2 present with audible click Abdomen:  Soft, nontender and nondistended. No masses, hepatosplenomegaly or hernias noted. Normal bowel sounds, without guarding, and without rebound.   Rectal: deferred   Msk:  Symmetrical without gross deformities. Normal posture. Extremities:  With 1+ edema Neurologic:  Alert and  oriented x4. Skin:  Intact without significant lesions or rashes. Psych:  Alert and cooperative. Normal mood and affect.  Intake/Output from previous day: No intake/output data recorded. Intake/Output this shift: Total I/O In: 120 [P.O.:120] Out: 3525 [Urine:3525]    Labs/Studies   Recent Labs Recent Labs    06/18/23 0805 06/18/23 0824  WBC 6.8  --   HGB 8.5* 9.5*  HCT 27.8* 28.0*  PLT 261  --    BMET Recent Labs    06/18/23 0805 06/18/23 0824  NA 136 140  K 3.8 3.9  CL 101 103  CO2 24  --   GLUCOSE 167* 152*  BUN 18 17  CREATININE 1.14 1.20  CALCIUM 8.6*  --    LFT Recent Labs    06/18/23 0805  PROT 6.8  ALBUMIN 3.7  AST 25  ALT 29  ALKPHOS 94  BILITOT 0.5   PT/INR Recent Labs    06/18/23 0805  LABPROT 28.7*  INR 2.7*   Lab Results  Component Value Date   IRON 17 (L) 06/18/2023   TIBC 439 06/18/2023   FERRITIN 11 (L) 06/18/2023     Radiology/Studies DG  Chest Portable 1 View Result Date: 06/18/2023 CLINICAL DATA:  Chest pain EXAM: PORTABLE CHEST 1 VIEW COMPARISON:  05/21/2023 FINDINGS: The cardio pericardial silhouette is enlarged. There is pulmonary vascular congestion without overt airspace pulmonary edema. No focal airspace consolidation or substantial pleural effusion. No acute bony abnormality. Telemetry leads overlie the chest. IMPRESSION: Enlargement of the cardiopericardial silhouette with pulmonary vascular congestion. Component of interstitial edema not excluded. Electronically Signed   By: Kennith Center M.D.   On: 06/18/2023 09:29   CT Angio Chest/Abd/Pel for Dissection W and/or Wo Contrast Result Date: 06/18/2023 CLINICAL DATA:  Acute aortic syndrome suspected, chest and shoulder pain for 1 week, recent aortic aneurysm repair EXAM: CT ANGIOGRAPHY CHEST, ABDOMEN AND PELVIS TECHNIQUE: Non-contrast CT of the chest was initially obtained. Multidetector CT imaging through the chest, abdomen and pelvis was performed using the standard protocol during bolus administration of intravenous contrast. Multiplanar reconstructed images and MIPs were obtained and reviewed to evaluate the vascular anatomy.  RADIATION DOSE REDUCTION: This exam was performed according to the departmental dose-optimization program which includes automated exposure control, adjustment of the mA and/or kV according to patient size and/or use of iterative reconstruction technique. CONTRAST:  75mL OMNIPAQUE IOHEXOL 350 MG/ML SOLN COMPARISON:  CT chest angiogram, 04/19/2023 FINDINGS: CTA CHEST FINDINGS VASCULAR Aorta: Satisfactory opacification of the aorta. 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 Intermediate attenuation collection about the graft, without evidence of contrast opacification to suggest dehiscence (series 5, image 40). Mild enlargement of the remaining anatomic aortic arch measuring up to 3.5 cm in caliber,  with normal caliber of the descending thoracic aorta. Cardiovascular: No evidence of pulmonary embolism on limited non-tailored examination. Global cardiomegaly. No pericardial effusion. Review of the MIP images confirms the above findings. NON VASCULAR Mediastinum/Nodes: Prominent subcentimeter mediastinal lymph nodes. Thyroid gland, trachea, and esophagus demonstrate no significant findings. Lungs/Pleura: Small bilateral pleural effusions and associated atelectasis or consolidation, with interlobular septal thickening throughout the lungs. Musculoskeletal: Status post median sternotomy. No acute osseous findings. Review of the MIP images confirms the above findings. CTA ABDOMEN AND PELVIS FINDINGS VASCULAR Normal contour and caliber of the abdominal aorta. No evidence of aneurysm, dissection, or other acute aortic pathology. Standard branching pattern of the abdominal aorta with solitary bilateral renal arteries. 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 The vessel and its proximal branches remain opacified. Scattered atherosclerosis of the bilateral common iliac arteries. Review of the MIP images confirms the above findings. NON-VASCULAR Hepatobiliary: No solid liver abnormality is seen. Hepatic steatosis. No gallstones, gallbladder wall thickening, or biliary dilatation. Pancreas: Unremarkable. No pancreatic ductal dilatation or surrounding inflammatory changes. Spleen: Normal in size without significant abnormality. Adrenals/Urinary Tract: Adrenal glands are unremarkable. Kidneys are normal, without renal calculi, solid lesion, or hydronephrosis. Bladder is unremarkable. Stomach/Bowel: Stomach is within normal limits. Appendix appears normal. No evidence of bowel wall thickening, distention, or inflammatory changes. Lymphatic: No enlarged abdominal or pelvic lymph nodes. Reproductive: No mass or other significant  abnormality. Other: No abdominal wall hernia. Mild anasarca. Small volume simple fluid attenuation ascites. Musculoskeletal: No acute osseous findings. IMPRESSION: 1. 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 Intermediate attenuation collection about the graft, most likely postoperative, without evidence of contrast opacification to suggest dehiscence. 2. Mild enlargement of the remaining anatomic aortic arch measuring up to 3.5 cm in caliber, with normal caliber of the descending thoracic aorta. 3. Normal contour and caliber of the abdominal aorta. No evidence of aneurysm, dissection, or other acute aortic pathology. 4. 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 The vessel and its proximal branches remain opacified. 5. Small bilateral pleural effusions and pulmonary edema. Small volume ascites and anasarca. 6. Global cardiomegaly. 7. Hepatic steatosis. Electronically Signed   By: Jearld Lesch M.D.   On: 06/18/2023 09:23     Assessment   Chase Lee is a 43 y.o. year old male  with a history of mechanical AVR on warfarin, ascending aortic aneurysm repair Feb 2025, HTN, afib, LBBB, GERD, anemia, hepatic steatosis, gastroenteritis in 2023 and concern for portal venous gas, prior concern for mass in ascending colon in March 2023 during hospitalization for gastroenteritis, presenting to the ED today with chest pain, SOB, and admitted with acute heart failure. He was found to have worsening anemia but  heme negative stools. GI has been requested.   Worsening anemia: Hgb 8.5, down from 11 in FEb 2025. Baseline appears 10/11 range. Microcytic. Notable IDA component with ferritin 11, iron 17. Although he notes darker stool, this does not seem consistent with melena but unable to exclude. Heme negative. No prior colonoscopy/EGD. In light of notable IDA on warfarin and concern for possible  mass in the past (although this admission CTA without this mention), recommend colonoscopy/EGD when stable from cardiac standpoint.   Appreciate cardiology following.     Plan / Recommendations    Full liquids Clear liquids tomorrow Reassess daily for stability to arrange procedures     06/18/2023, 1:43 PM  Gelene Mink, PhD, [facility] Gastroenterology

## 2023-06-18 NOTE — ED Provider Notes (Signed)
 [facility] AT [facility] Provider Note   CSN: [id number] Arrival date & time: 06/18/23  4098     History  Chief Complaint  Patient presents with   Chest Pain    Chase Lee is a 43 y.o. male.  HPI 43 year old male with history of a previous aortic valve replacement as well as an aortic aneurysm surgery almost 2 months ago presents with chest pain.  He states yesterday he was lifting a mattress and felt some right sided chest pain right afterwards.  He is not sure if he pulled a muscle but also wants to make sure he is otherwise okay from a heart perspective.  He and his some mild dyspnea for the past few days that he feels like is due to a lack of exercise.  No cough or fever but states he has been having some dry heaving since last night after the chest pain onset, though he thinks that might be related to pizza that he ate.  Pain is mild to moderate currently.  Has had some upper abdominal pain ever since he had surgery.  Home Medications Prior to Admission medications   Medication Sig Start Date End Date Taking? Authorizing Provider  ALPRAZolam (XANAX) 1 MG tablet TAKE 1 TABLET(1 MG) BY MOUTH THREE TIMES DAILY AS NEEDED FOR ANXIETY 06/08/23   Mozingo, Thereasa Solo, NP  amiodarone (PACERONE) 200 MG tablet Take 1 tablet (200 mg total) by mouth 2 (two) times daily for 14 days, THEN 1 tablet (200 mg total) daily. 05/31/23 08/13/23  Ellsworth Lennox, PA-C  aspirin EC 81 MG tablet Take 1 tablet (81 mg total) by mouth daily. Swallow whole. 05/04/23   Leary Roca, PA-C  Compression Bandages KIT 1 each by Does not apply route as needed (swelling). Adult compression stocking 15-20 adult size medium-large 03/05/23   Sharlene Dory, NP  escitalopram (LEXAPRO) 10 MG tablet TAKE 1 TABLET(10 MG) BY MOUTH DAILY 06/01/23   Mozingo, Thereasa Solo, NP  lisinopril (ZESTRIL) 20 MG tablet Take 1 tablet (20 mg total) by mouth daily. 06/07/23 09/05/23   Strader, Lennart Pall, PA-C  metoprolol tartrate (LOPRESSOR) 25 MG tablet Take 1 tablet (25 mg total) by mouth 2 (two) times daily. 04/05/23   Mallipeddi, Vishnu P, MD  omeprazole (PRILOSEC) 40 MG capsule Take 1 capsule (40 mg total) by mouth daily. 04/28/19   Avegno, Zachery Dakins, FNP  oxycodone (OXY-IR) 5 MG capsule Take 1 capsule (5 mg total) by mouth every 6 (six) hours as needed for pain. 05/21/23   Alleen Borne, MD  warfarin (COUMADIN) 5 MG tablet Take 1 tablet (5 mg total) by mouth daily. 05/04/23   Leary Roca, PA-C      Allergies    Patient has no known allergies.    Review of Systems   Review of Systems  Constitutional:  Negative for fever.  Respiratory:  Positive for shortness of breath. Negative for cough.   Cardiovascular:  Positive for chest pain.  Gastrointestinal:  Positive for abdominal pain (ongoing since surgery) and vomiting (dry heaving).    Physical Exam Updated Vital Signs BP (!) 163/84 (BP Location: Right Arm)   Pulse (!) 54   Temp 98.7 F (37.1 C) (Oral)   Resp 17   SpO2 94%  Physical Exam Vitals and nursing note reviewed. Exam conducted with a chaperone present.  Constitutional:      General: He is not in acute distress.    Appearance: He is well-developed.  He is not ill-appearing or diaphoretic.  HENT:     Head: Normocephalic and atraumatic.  Cardiovascular:     Rate and Rhythm: Normal rate and regular rhythm.     Pulses:          Radial pulses are 2+ on the right side and 2+ on the left side.     Heart sounds: Murmur heard.  Pulmonary:     Effort: Pulmonary effort is normal.     Breath sounds: Normal breath sounds.  Chest:     Chest wall: Tenderness (right sided) present.  Abdominal:     Palpations: Abdomen is soft.     Tenderness: There is abdominal tenderness (mild, upper, near surgical scars).  Genitourinary:    Rectum: Guaiac result negative. No tenderness.     Comments: Brown stool on DRE Skin:    General: Skin is warm and dry.   Neurological:     Mental Status: He is alert.     ED Results / Procedures / Treatments   Labs (all labs ordered are listed, but only abnormal results are displayed) Labs Reviewed  CBC WITH DIFFERENTIAL/PLATELET - Abnormal; Notable for the following components:      Result Value   RBC 3.45 (*)    Hemoglobin 8.5 (*)    HCT 27.8 (*)    MCH 24.6 (*)    RDW 16.5 (*)    All other components within normal limits  PROTIME-INR - Abnormal; Notable for the following components:   Prothrombin Time 28.7 (*)    INR 2.7 (*)    All other components within normal limits  COMPREHENSIVE METABOLIC PANEL WITH GFR - Abnormal; Notable for the following components:   Glucose, Bld 167 (*)    Calcium 8.6 (*)    All other components within normal limits  BRAIN NATRIURETIC PEPTIDE - Abnormal; Notable for the following components:   B Natriuretic Peptide 616.0 (*)    All other components within normal limits  IRON AND TIBC - Abnormal; Notable for the following components:   Iron 17 (*)    Saturation Ratios 4 (*)    All other components within normal limits  FERRITIN - Abnormal; Notable for the following components:   Ferritin 11 (*)    All other components within normal limits  RETICULOCYTES - Abnormal; Notable for the following components:   RBC. 3.46 (*)    Immature Retic Fract 32.1 (*)    All other components within normal limits  I-STAT CHEM 8, ED - Abnormal; Notable for the following components:   Glucose, Bld 152 (*)    Hemoglobin 9.5 (*)    HCT 28.0 (*)    All other components within normal limits  TROPONIN I (HIGH SENSITIVITY) - Abnormal; Notable for the following components:   Troponin I (High Sensitivity) 26 (*)    All other components within normal limits  TROPONIN I (HIGH SENSITIVITY) - Abnormal; Notable for the following components:   Troponin I (High Sensitivity) 27 (*)    All other components within normal limits  LIPASE, BLOOD  VITAMIN B12  FOLATE  POC OCCULT BLOOD, ED     EKG EKG Interpretation Date/Time:  Monday June 18 2023 08:07:43 EDT Ventricular Rate:  52 PR Interval:  172 QRS Duration:  182 QT Interval:  641 QTC Calculation: 597 R Axis:   101  Text Interpretation: Sinus rhythm Nonspecific intraventricular conduction delay Borderline T abnormalities, lateral leads Baseline wander in lead(s) V3 Confirmed by Pricilla Loveless [phone]) on 06/18/2023 8:16:32 AM  Radiology DG Chest Portable 1 View Result Date: 06/18/2023 CLINICAL DATA:  Chest pain EXAM: PORTABLE CHEST 1 VIEW COMPARISON:  05/21/2023 FINDINGS: The cardio pericardial silhouette is enlarged. There is pulmonary vascular congestion without overt airspace pulmonary edema. No focal airspace consolidation or substantial pleural effusion. No acute bony abnormality. Telemetry leads overlie the chest. IMPRESSION: Enlargement of the cardiopericardial silhouette with pulmonary vascular congestion. Component of interstitial edema not excluded. Electronically Signed   By: Kennith Center M.D.   On: 06/18/2023 09:29   CT Angio Chest/Abd/Pel for Dissection W and/or Wo Contrast Result Date: 06/18/2023 CLINICAL DATA:  Acute aortic syndrome suspected, chest and shoulder pain for 1 week, recent aortic aneurysm repair EXAM: CT ANGIOGRAPHY CHEST, ABDOMEN AND PELVIS TECHNIQUE: Non-contrast CT of the chest was initially obtained. Multidetector CT imaging through the chest, abdomen and pelvis was performed using the standard protocol during bolus administration of intravenous contrast. Multiplanar reconstructed images and MIPs were obtained and reviewed to evaluate the vascular anatomy. RADIATION DOSE REDUCTION: This exam was performed according to the departmental dose-optimization program which includes automated exposure control, adjustment of the mA and/or kV according to patient size and/or use of iterative reconstruction technique. CONTRAST:  75mL OMNIPAQUE IOHEXOL 350 MG/ML SOLN COMPARISON:  CT chest angiogram,  04/19/2023 FINDINGS: CTA CHEST FINDINGS VASCULAR Aorta: Satisfactory opacification of the aorta. 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 Intermediate attenuation collection about the graft, without evidence of contrast opacification to suggest dehiscence (series 5, image 40). Mild enlargement of the remaining anatomic aortic arch measuring up to 3.5 cm in caliber, with normal caliber of the descending thoracic aorta. Cardiovascular: No evidence of pulmonary embolism on limited non-tailored examination. Global cardiomegaly. No pericardial effusion. Review of the MIP images confirms the above findings. NON VASCULAR Mediastinum/Nodes: Prominent subcentimeter mediastinal lymph nodes. Thyroid gland, trachea, and esophagus demonstrate no significant findings. Lungs/Pleura: Small bilateral pleural effusions and associated atelectasis or consolidation, with interlobular septal thickening throughout the lungs. Musculoskeletal: Status post median sternotomy. No acute osseous findings. Review of the MIP images confirms the above findings. CTA ABDOMEN AND PELVIS FINDINGS VASCULAR Normal contour and caliber of the abdominal aorta. No evidence of aneurysm, dissection, or other acute aortic pathology. Standard branching pattern of the abdominal aorta with solitary bilateral renal arteries. 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 The vessel and its proximal branches remain opacified. Scattered atherosclerosis of the bilateral common iliac arteries. Review of the MIP images confirms the above findings. NON-VASCULAR Hepatobiliary: No solid liver abnormality is seen. Hepatic steatosis. No gallstones, gallbladder wall thickening, or biliary dilatation. Pancreas: Unremarkable. No pancreatic ductal dilatation or surrounding inflammatory changes. Spleen: Normal in size  without significant abnormality. Adrenals/Urinary Tract: Adrenal glands are unremarkable. Kidneys are normal, without renal calculi, solid lesion, or hydronephrosis. Bladder is unremarkable. Stomach/Bowel: Stomach is within normal limits. Appendix appears normal. No evidence of bowel wall thickening, distention, or inflammatory changes. Lymphatic: No enlarged abdominal or pelvic lymph nodes. Reproductive: No mass or other significant abnormality. Other: No abdominal wall hernia. Mild anasarca. Small volume simple fluid attenuation ascites. Musculoskeletal: No acute osseous findings. IMPRESSION: 1. 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 Intermediate attenuation collection about the graft, most likely postoperative, without evidence of contrast opacification to suggest dehiscence. 2. Mild enlargement of the remaining anatomic aortic arch measuring up to 3.5 cm in  caliber, with normal caliber of the descending thoracic aorta. 3. Normal contour and caliber of the abdominal aorta. No evidence of aneurysm, dissection, or other acute aortic pathology. 4. 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 The vessel and its proximal branches remain opacified. 5. Small bilateral pleural effusions and pulmonary edema. Small volume ascites and anasarca. 6. Global cardiomegaly. 7. Hepatic steatosis. Electronically Signed   By: Jearld Lesch M.D.   On: 06/18/2023 09:23    Procedures Procedures    Medications Ordered in ED Medications  furosemide (LASIX) injection 40 mg (has no administration in time range)  sodium chloride flush (NS) 0.9 % injection 3 mL (has no administration in time range)  sodium chloride flush (NS) 0.9 % injection 3 mL (has no administration in time range)  0.9 %  sodium chloride infusion (has no administration in time range)  acetaminophen (TYLENOL) tablet 650 mg (has no  administration in time range)  ondansetron (ZOFRAN) injection 4 mg (has no administration in time range)  zolpidem (AMBIEN) tablet 5 mg (has no administration in time range)  pantoprazole (PROTONIX) EC tablet 40 mg (has no administration in time range)  ketorolac (TORADOL) 15 MG/ML injection 15 mg (15 mg Intravenous Given 06/18/23 1411)  oxyCODONE (Oxy IR/ROXICODONE) immediate release tablet 5 mg (has no administration in time range)  fentaNYL (SUBLIMAZE) injection 12.5 mcg (has no administration in time range)  prochlorperazine (COMPAZINE) injection 10 mg (has no administration in time range)  metoprolol tartrate (LOPRESSOR) tablet 25 mg (25 mg Oral Not Given 06/18/23 1353)  aspirin EC tablet 81 mg (has no administration in time range)  amiodarone (PACERONE) tablet 200 mg (200 mg Oral Not Given 06/18/23 1352)  ALPRAZolam (XANAX) tablet 1 mg (1 mg Oral Given 06/18/23 1410)  warfarin (COUMADIN) tablet 2.5 mg (has no administration in time range)  Warfarin - Pharmacist Dosing Inpatient (has no administration in time range)  perflutren lipid microspheres (DEFINITY) IV suspension (5 mLs Intravenous Given 06/18/23 1149)  escitalopram (LEXAPRO) tablet 10 mg (has no administration in time range)  lisinopril (ZESTRIL) tablet 20 mg (has no administration in time range)  morphine (PF) 4 MG/ML injection 4 mg (4 mg Intravenous Given 06/18/23 0838)  iohexol (OMNIPAQUE) 350 MG/ML injection 75 mL (75 mLs Intravenous Contrast Given 06/18/23 0844)  furosemide (LASIX) injection 40 mg (40 mg Intravenous Given 06/18/23 1011)  potassium chloride SA (KLOR-CON M) CR tablet 40 mEq (40 mEq Oral Given 06/18/23 1410)    ED Course/ Medical Decision Making/ A&P                                 Medical Decision Making Amount and/or Complexity of Data Reviewed Labs: ordered.    Details: Hemoglobin of 8.5 is lower than his typical baseline around 10.  Troponin slightly elevated but flat. Radiology: ordered and independent  interpretation performed.    Details: No aortic dissection but does have pleural effusions ECG/medicine tests: ordered and independent interpretation performed.    Details: Bundle branch block  Risk Prescription drug management. Decision regarding hospitalization.   Patient with chest pain.  This seems to be atypical and he does have an aortic aneurysm history status post repair.  CTA does not show any obvious acute surgical complication or a dissection.  However he does have concerning findings for possible CHF with some pulmonary edema and pleural effusions.  BNP is over 600.  He  will be diuresed and I discussed with Dr. Wyline Mood.  He will help consult and order echo.  Also discussed with CT surgery on-call, [facility] [place] (Dr. Laneta Simmers is in OR). They will look at CT but feel he is ok to stay at [facility].  He has noted to have anemia worse than his baseline but no obvious source.  He reports some dark stool but his stool is brown here and no melena and is heme-negative.  Unclear if this is a GI source of his anemia or not.  He will need admission and I discussed with Dr. Laural Benes.        Final Clinical Impression(s) / ED Diagnoses Final diagnoses:  Acute congestive heart failure, unspecified heart failure type [facility])    Rx / DC Orders ED Discharge Orders     None         Pricilla Loveless, MD 06/18/23 1454

## 2023-06-19 ENCOUNTER — Observation Stay (HOSPITAL_COMMUNITY)

## 2023-06-19 ENCOUNTER — Telehealth: Payer: Self-pay | Admitting: Gastroenterology

## 2023-06-19 ENCOUNTER — Encounter (HOSPITAL_COMMUNITY): Payer: Self-pay | Admitting: Family Medicine

## 2023-06-19 DIAGNOSIS — I509 Heart failure, unspecified: Secondary | ICD-10-CM | POA: Diagnosis not present

## 2023-06-19 DIAGNOSIS — I480 Paroxysmal atrial fibrillation: Secondary | ICD-10-CM | POA: Diagnosis not present

## 2023-06-19 DIAGNOSIS — D509 Iron deficiency anemia, unspecified: Secondary | ICD-10-CM

## 2023-06-19 DIAGNOSIS — R935 Abnormal findings on diagnostic imaging of other abdominal regions, including retroperitoneum: Secondary | ICD-10-CM

## 2023-06-19 DIAGNOSIS — R079 Chest pain, unspecified: Secondary | ICD-10-CM | POA: Diagnosis not present

## 2023-06-19 DIAGNOSIS — R0789 Other chest pain: Secondary | ICD-10-CM | POA: Diagnosis not present

## 2023-06-19 DIAGNOSIS — D6869 Other thrombophilia: Secondary | ICD-10-CM | POA: Diagnosis not present

## 2023-06-19 DIAGNOSIS — I4891 Unspecified atrial fibrillation: Secondary | ICD-10-CM | POA: Diagnosis not present

## 2023-06-19 LAB — BASIC METABOLIC PANEL WITH GFR
Anion gap: 8 (ref 5–15)
BUN: 21 mg/dL — ABNORMAL HIGH (ref 6–20)
CO2: 28 mmol/L (ref 22–32)
Calcium: 8.9 mg/dL (ref 8.9–10.3)
Chloride: 101 mmol/L (ref 98–111)
Creatinine, Ser: 1.18 mg/dL (ref 0.61–1.24)
GFR, Estimated: >60 mL/min (ref >60)
Glucose, Bld: 120 mg/dL — ABNORMAL HIGH (ref 70–99)
Potassium: 3.8 mmol/L (ref 3.5–5.1)
Sodium: 137 mmol/L (ref 135–145)

## 2023-06-19 LAB — LIPID PANEL
Cholesterol: 171 mg/dL (ref 0–200)
HDL: 41 mg/dL (ref >40)
LDL Cholesterol: 111 mg/dL — ABNORMAL HIGH (ref 0–99)
Total CHOL/HDL Ratio: 4.2 ratio
Triglycerides: 93 mg/dL (ref <150)
VLDL: 19 mg/dL (ref 0–40)

## 2023-06-19 LAB — CBC
HCT: 29.7 % — ABNORMAL LOW (ref 39.0–52.0)
Hemoglobin: 8.9 g/dL — ABNORMAL LOW (ref 13.0–17.0)
MCH: 23.8 pg — ABNORMAL LOW (ref 26.0–34.0)
MCHC: 30 g/dL (ref 30.0–36.0)
MCV: 79.4 fL — ABNORMAL LOW (ref 80.0–100.0)
Platelets: 278 10*3/uL (ref 150–400)
RBC: 3.74 MIL/uL — ABNORMAL LOW (ref 4.22–5.81)
RDW: 16.4 % — ABNORMAL HIGH (ref 11.5–15.5)
WBC: 6.6 10*3/uL (ref 4.0–10.5)
nRBC: 0 % (ref 0.0–0.2)

## 2023-06-19 LAB — PROTIME-INR
INR: 2.3 — ABNORMAL HIGH (ref 0.8–1.2)
Prothrombin Time: 25.6 s — ABNORMAL HIGH (ref 11.4–15.2)

## 2023-06-19 LAB — MAGNESIUM: Magnesium: 2.1 mg/dL (ref 1.7–2.4)

## 2023-06-19 LAB — BRAIN NATRIURETIC PEPTIDE: B Natriuretic Peptide: 344 pg/mL — ABNORMAL HIGH (ref 0.0–100.0)

## 2023-06-19 MED ORDER — WARFARIN SODIUM 2.5 MG PO TABS: 2.5000 mg | ORAL_TABLET | Freq: Once | ORAL | Status: DC

## 2023-06-19 MED ORDER — OXYCODONE HCL 5 MG PO CAPS: 5.0000 mg | ORAL_CAPSULE | ORAL | Status: DC | PRN

## 2023-06-19 MED ORDER — AMIODARONE HCL 200 MG PO TABS: 200.0000 mg | ORAL_TABLET | Freq: Every day | ORAL | Status: DC

## 2023-06-19 MED ORDER — FUROSEMIDE 40 MG PO TABS: 40.0000 mg | ORAL_TABLET | Freq: Every day | ORAL | Status: DC

## 2023-06-19 MED ORDER — OMEPRAZOLE 40 MG PO CPDR
40.0000 mg | DELAYED_RELEASE_CAPSULE | Freq: Two times a day (BID) | ORAL | 1 refills | Status: AC
Start: 2023-06-19 — End: ?

## 2023-06-19 MED ORDER — LISINOPRIL 10 MG PO TABS: 40.0000 mg | ORAL_TABLET | Freq: Every day | ORAL | Status: DC

## 2023-06-19 MED ORDER — ROSUVASTATIN CALCIUM 10 MG PO TABS: 10.0000 mg | ORAL_TABLET | Freq: Every day | ORAL | 1 refills | Status: DC

## 2023-06-19 MED ORDER — ROSUVASTATIN CALCIUM 10 MG PO TABS
10.0000 mg | ORAL_TABLET | Freq: Every day | ORAL | Status: DC
Start: 2023-06-19 — End: 2023-06-19
  Administered 2023-06-19: 10 mg via ORAL
  Filled 2023-06-19: qty 1

## 2023-06-19 MED ORDER — VITAMIN C 500 MG PO TABS
500.0000 mg | ORAL_TABLET | Freq: Every day | ORAL | 1 refills | Status: AC
Start: 2023-06-19 — End: ?

## 2023-06-19 MED ORDER — LISINOPRIL 40 MG PO TABS: 40.0000 mg | ORAL_TABLET | Freq: Every day | ORAL | 0 refills | Status: DC

## 2023-06-19 MED ORDER — FERROUS SULFATE 325 (65 FE) MG PO TBEC: 325.0000 mg | DELAYED_RELEASE_TABLET | Freq: Every day | ORAL | 1 refills | Status: DC

## 2023-06-19 MED ORDER — FUROSEMIDE 40 MG PO TABS
40.0000 mg | ORAL_TABLET | Freq: Every day | ORAL | 1 refills | Status: DC
Start: 2023-06-20 — End: 2024-01-15

## 2023-06-19 MED ORDER — LISINOPRIL 10 MG PO TABS
20.0000 mg | ORAL_TABLET | Freq: Once | ORAL | Status: AC
  Administered 2023-06-19: 20 mg via ORAL

## 2023-06-19 MED ORDER — FUROSEMIDE 10 MG/ML IJ SOLN
40.0000 mg | Freq: Once | INTRAMUSCULAR | Status: AC
  Administered 2023-06-19: 40 mg via INTRAVENOUS
  Filled 2023-06-19: qty 4

## 2023-06-19 NOTE — Progress Notes (Addendum)
 Rounding Note    Patient Name: Chase Lee Date of Encounter: 06/19/2023  [facility] Cardiologist: Marjo Bicker, MD   Subjective   Breathing improved but not back to baseline. No chest pain or palpitations. Still with right shoulder pain (had lifted a mattress prior to admission).   Inpatient Medications    Scheduled Meds:  amiodarone  200 mg Oral BID   aspirin EC  81 mg Oral Daily   escitalopram  10 mg Oral Daily   lisinopril  20 mg Oral Daily   metoprolol tartrate  25 mg Oral BID   pantoprazole  40 mg Oral QPM   sodium chloride flush  3 mL Intravenous Q12H   Warfarin - Pharmacist Dosing Inpatient   Does not apply q1600   Continuous Infusions:  sodium chloride     PRN Meds: sodium chloride, acetaminophen, ALPRAZolam, fentaNYL (SUBLIMAZE) injection, ketorolac, ondansetron (ZOFRAN) IV, oxyCODONE, prochlorperazine, sodium chloride flush, zolpidem   Vital Signs    Vitals:   06/18/23 1335 06/18/23 1833 06/18/23 2024 06/19/23 0333  BP: (!) 163/84 (!) 168/83 (!) 152/92 (!) 143/79  Pulse: (!) 54 (!) 58 (!) 58 (!) 56  Resp: 17 18 19 17   Temp: 98.7 F (37.1 C) 97.7 F (36.5 C) 98.7 F (37.1 C) 98.3 F (36.8 C)  TempSrc: Oral Oral Oral Oral  SpO2: 94% 100% 96% 95%  Weight:    102.6 kg    Intake/Output Summary (Last 24 hours) at 06/19/2023 0755 Last data filed at 06/18/2023 1336 Gross per 24 hour  Intake 120 ml  Output 3525 ml  Net -3405 ml      06/19/2023    3:33 AM 06/06/2023    3:22 PM 06/06/2023    9:44 AM  Last 3 Weights  Weight (lbs) 226 lb 3.1 oz 228 lb 230 lb 13.2 oz  Weight (kg) 102.6 kg 103.42 kg 104.7 kg      Telemetry    Sinus bradycardia, HR in 50's to 60's.  - Personally Reviewed  ECG    Sinus bradycardia, HR 52 with LBBB.  - Personally Reviewed  Physical Exam   GEN: No acute distress.   Neck: JVD slightly elevated.  Cardiac: RRR, crisp mechanical valve sounds. 2/6 systolic murmur.  Respiratory: Rales along bases  bilaterally.  GI: Soft, nontender, non-distended  MS: Trace lower extremity edema; No deformity. Neuro:  Nonfocal  Psych: Normal affect   Labs    High Sensitivity Troponin:   Recent Labs  Lab 06/18/23 0805 06/18/23 0953  TROPONINIHS 26* 27*     Chemistry Recent Labs  Lab 06/18/23 0805 06/18/23 0824 06/19/23 0431  NA 136 140 137  K 3.8 3.9 3.8  CL 101 103 101  CO2 24  --  28  GLUCOSE 167* 152* 120*  BUN 18 17 21*  CREATININE 1.14 1.20 1.18  CALCIUM 8.6*  --  8.9  MG  --   --  2.1  PROT 6.8  --   --   ALBUMIN 3.7  --   --   AST 25  --   --   ALT 29  --   --   ALKPHOS 94  --   --   BILITOT 0.5  --   --   GFRNONAA >60  --  >60  ANIONGAP 11  --  8    Lipids  Recent Labs  Lab 06/19/23 0431  CHOL 171  TRIG 93  HDL 41  LDLCALC 111*  CHOLHDL 4.2  Hematology Recent Labs  Lab 06/18/23 0805 06/18/23 0824 06/18/23 1138 06/19/23 0431  WBC 6.8  --   --  6.6  RBC 3.45*  --  3.46* 3.74*  HGB 8.5* 9.5*  --  8.9*  HCT 27.8* 28.0*  --  29.7*  MCV 80.6  --   --  79.4*  MCH 24.6*  --   --  23.8*  MCHC 30.6  --   --  30.0  RDW 16.5*  --   --  16.4*  PLT 261  --   --  278   Thyroid No results for input(s): "TSH", "FREET4" in the last 168 hours.  BNP Recent Labs  Lab 06/18/23 0905 06/19/23 0431  BNP 616.0* 344.0*    DDimer No results for input(s): "DDIMER" in the last 168 hours.   Radiology    DG Chest Port 1 View Result Date: 06/19/2023 CLINICAL DATA:  43 year old male with heart failure. EXAM: PORTABLE CHEST 1 VIEW COMPARISON:  CT Chest, Abdomen, and Pelvis today are reported separately. Yesterday and earlier. FINDINGS: Portable AP upright view at 0635 hours. Stable cardiomegaly and mediastinal contours. Prior sternotomy. Visualized tracheal air column is within normal limits. No pneumothorax, pulmonary edema or consolidation. Regressed vascular congestion compared to portable chest yesterday. Small layering pleural effusions on CT yesterday are not apparent  now. Stable visualized osseous structures. IMPRESSION: Stable cardiomegaly. Regression of pulmonary edema since yesterday. Small pleural effusions not evident now. Electronically Signed   By: Odessa Fleming M.D.   On: 06/19/2023 06:49   DG Chest Portable 1 View Result Date: 06/18/2023 CLINICAL DATA:  Chest pain EXAM: PORTABLE CHEST 1 VIEW COMPARISON:  05/21/2023 FINDINGS: The cardio pericardial silhouette is enlarged. There is pulmonary vascular congestion without overt airspace pulmonary edema. No focal airspace consolidation or substantial pleural effusion. No acute bony abnormality. Telemetry leads overlie the chest. IMPRESSION: Enlargement of the cardiopericardial silhouette with pulmonary vascular congestion. Component of interstitial edema not excluded. Electronically Signed   By: Kennith Center M.D.   On: 06/18/2023 09:29   CT Angio Chest/Abd/Pel for Dissection W and/or Wo Contrast Result Date: 06/18/2023 CLINICAL DATA:  Acute aortic syndrome suspected, chest and shoulder pain for 1 week, recent aortic aneurysm repair EXAM: CT ANGIOGRAPHY CHEST, ABDOMEN AND PELVIS TECHNIQUE: Non-contrast CT of the chest was initially obtained. Multidetector CT imaging through the chest, abdomen and pelvis was performed using the standard protocol during bolus administration of intravenous contrast. Multiplanar reconstructed images and MIPs were obtained and reviewed to evaluate the vascular anatomy. RADIATION DOSE REDUCTION: This exam was performed according to the departmental dose-optimization program which includes automated exposure control, adjustment of the mA and/or kV according to patient size and/or use of iterative reconstruction technique. CONTRAST:  75mL OMNIPAQUE IOHEXOL 350 MG/ML SOLN COMPARISON:  CT chest angiogram, 04/19/2023 FINDINGS: CTA CHEST FINDINGS VASCULAR Aorta: Satisfactory opacification of the aorta. Interval Bentall type graft repair of the tubular ascending thoracic aorta with aortic valve  prosthesis. Intermediate attenuation collection about the graft, without evidence of contrast opacification to suggest dehiscence (series 5, image 40). Mild enlargement of the remaining anatomic aortic arch measuring up to 3.5 cm in caliber, with normal caliber of the descending thoracic aorta. Cardiovascular: No evidence of pulmonary embolism on limited non-tailored examination. Global cardiomegaly. No pericardial effusion. Review of the MIP images confirms the above findings. NON VASCULAR Mediastinum/Nodes: Prominent subcentimeter mediastinal lymph nodes. Thyroid gland, trachea, and esophagus demonstrate no significant findings. Lungs/Pleura: Small bilateral pleural effusions and associated atelectasis or consolidation, with  interlobular septal thickening throughout the lungs. Musculoskeletal: Status post median sternotomy. No acute osseous findings. Review of the MIP images confirms the above findings. CTA ABDOMEN AND PELVIS FINDINGS VASCULAR Normal contour and caliber of the abdominal aorta. No evidence of aneurysm, dissection, or other acute aortic pathology. Standard branching pattern of the abdominal aorta with solitary bilateral renal arteries. 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 The vessel and its proximal branches remain opacified. Scattered atherosclerosis of the bilateral common iliac arteries. Review of the MIP images confirms the above findings. NON-VASCULAR Hepatobiliary: No solid liver abnormality is seen. Hepatic steatosis. No gallstones, gallbladder wall thickening, or biliary dilatation. Pancreas: Unremarkable. No pancreatic ductal dilatation or surrounding inflammatory changes. Spleen: Normal in size without significant abnormality. Adrenals/Urinary Tract: Adrenal glands are unremarkable. Kidneys are normal, without renal calculi, solid lesion, or hydronephrosis. Bladder is unremarkable.  Stomach/Bowel: Stomach is within normal limits. Appendix appears normal. No evidence of bowel wall thickening, distention, or inflammatory changes. Lymphatic: No enlarged abdominal or pelvic lymph nodes. Reproductive: No mass or other significant abnormality. Other: No abdominal wall hernia. Mild anasarca. Small volume simple fluid attenuation ascites. Musculoskeletal: No acute osseous findings. IMPRESSION: 1. 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 Intermediate attenuation collection about the graft, most likely postoperative, without evidence of contrast opacification to suggest dehiscence. 2. Mild enlargement of the remaining anatomic aortic arch measuring up to 3.5 cm in caliber, with normal caliber of the descending thoracic aorta. 3. Normal contour and caliber of the abdominal aorta. No evidence of aneurysm, dissection, or other acute aortic pathology. 4. 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 The vessel and its proximal branches remain opacified. 5. Small bilateral pleural effusions and pulmonary edema. Small volume ascites and anasarca. 6. Global cardiomegaly. 7. Hepatic steatosis. Electronically Signed   By: Jearld Lesch M.D.   On: 06/18/2023 09:23    Cardiac Studies   Echocardiogram: 06/2023 IMPRESSIONS     1. Left ventricular ejection fraction, by estimation, is 60 to 65%. The  left ventricle has normal function. The left ventricle has no regional  wall motion abnormalities. The left ventricular internal cavity size was  mildly to moderately dilated. Left  ventricular diastolic parameters are consistent with Grade II diastolic  dysfunction (pseudonormalization). Elevated left atrial pressure.   2. Right ventricular systolic function is normal. The right ventricular  size is mildly enlarged. There is mildly elevated pulmonary artery  systolic pressure. The estimated  right ventricular systolic pressure is  36.7 mmHg.   3. Left atrial size was severely dilated.   4. Right atrial size was mild to moderately dilated.   5. The mitral valve is normal in structure. Trivial mitral valve  regurgitation. No evidence of mitral stenosis.   6. The aortic valve has been repaired/replaced. Aortic valve  regurgitation is trivial. No aortic stenosis is present. There is a 25 mm  On-X mechanical valve present in the aortic position.   7. Aortic root/ascending aorta has been repaired/replaced and s/p Bentall  procedure.   8. The inferior vena cava is dilated in size with <50% respiratory  variability, suggesting right atrial pressure of 15 mmHg.   9. Increased flow velocities may be secondary to anemia, thyrotoxicosis,  hyperdynamic or high flow state.    Patient Profile     43 y.o. male w/ PMH of  bicuspid aortic valve (s/p aortic valvuloplasty at age 56 and subsequent mechanical AVR at 43 yo and s/p Bentall procedure using 25 mm On-X mechanical valve and replacement of ascending aorta with 32 mm Hemashield graft on 04/26/2023), CAD (Coronary CTA in 04/2023 showing 25-49% stenosis along ostial LM), HTN and palpitations who is currently admitted for evaluation of worsening dyspnea and chest pain.   Assessment & Plan    1. History of Bicuspid Aortic Valve - He has a history of multiple procedures as discussed above and most recent was a Bentall procedure using 25 mm On-X mechanical valve and replacement of ascending aorta with 32 mm Hemashield graft on 04/26/2023. - Echo this admission shows his mechanical valve is functioning normally with only trivial AI.  2. Noncardiac Chest Pain/Chronic LBBB - Initial and repeat Hs troponin values flat at 26 and 27. Recent Coronary CTA in 04/2023 only showed mild CAD as discussed above. LDL is at 111 this admission and given his CAD by prior coronary CTA, would recommend initiation of statin prior to discharge.  3. Post-operative Atrial  Fibrillation - Was noted to have atrial fibrillation in the post-operative setting and also during follow-up office visits. Amiodarone was titrated to 200 mg twice daily and then reduced to 200 mg daily with plans for a short-course of approximately 3 months given his young age. Recent outpatient monitor has not officially resulted but did show an atrial fibrillation burden of 9% with heart rate ranging from 60 to 142 bpm. Question if this could be contributing to his acute CHF exacerbation as well. He has been continued on Amiodarone 200 mg twice daily, Lopressor 25 mg twice daily and Coumadin.  Will review with Dr. Wyline Mood in regards to reducing to 200 mg once daily as previously discussed.  4. Acute HFpEF - Presented with worsening dyspnea on exertion over the past week and found to be anemic with hemoglobin at 8.5. BNP 616 on admission and he did receive IV Lasix 40 mg x 2 yesterday with a recorded net output of -3.4 L thus far. - Repeat echo shows a preserved EF of 60 to 65% with no regional wall motion abnormalities. Noted to have grade 2 diastolic dysfunction and normal RV function. - Anticipate additional IV Lasix today. Likely switching to PO tomorrow (was only taking 20mg  as needed at home).   5. Anemia - Hgb at 8.5 on admission and previously 11.0 on 05/04/2023. FOBT negative. Hgb at 8.9 today. GI following and planning for EGD/colonoscopy later this admission or as an outpatient. Ferritin low at 11 and would recommend starting iron supplementation.   For questions or updates, please contact [facility] Please consult [email or web address] for contact info under        Signed, Ellsworth Lennox, PA-C  06/19/2023, 7:55 AM     Attending Note    Patient seen and discussed with PA [place] Ouch, I agree with her documentation.    1.History of bicuspid aortic valve/Aortic aneurysm - valvuloplasty age 64, initial mechanical AVR age 71 - 04/2022 replacement of the ascending aorta (hemi-arch)  using a 32 mm Hemashield graft along with repeat AVR 25 mm on X mechanical AV -- presents with noncardiac chest pain, SOB/DOE.  -CTA shows stable aortic repair - echo this admit LVE 60-65%, no WMAs, grade II dd, normal RV, normal AVR - CT surgery reviewed CT and echo, no concerns about recent aneurysm repair from studies.  - has been on coumadin/ASA in setting of mechanical valve  2. Chest pain - noncardiac chest pain of right shoulder, started while lifting a mattress, tender to palpation and position.  - Preop 04/2023 CTA with just mild coronary disease.  - CTA of aorta in ER shows stable aortic repair - EKG chronic LBBB, trop 26 mild so far. Mild trop notes from labs 3 years ago, may be somewhat chronic, also some signs of fluid overload as well as anemia - no plans for ischemic testing.      3.Post op afib - occurred after recent AVR/aortic aneurysm repair. Some prolonged issues with afib at outpatient cards f/u - has been on amiodarone as outpatient - he is in SR today - continue home amio, toprol, coumadin.    - from 3/13 clinic note plans were for 2 weeks of additional amio 200mg  bid then 200mg  daily. SR this admit, lower amio to 200mg  daily.      4.Anemia - Hgb 8.5 on admission, down from 11 last month - per primary team, FOBT negative however reports some dark tarry stools - if plans for endoscopy can hold coumadin, would need lovenox bridging if done as outpatient given mechanical AV and afib.    5. HTN - elevated bp's increase lisinopril to 40mg  daily.    6. Acute HFpEF - CXR with vascular congestion - CT with small bilateral effusions and pulm edema. BNP 600s - volume overloaded on exam with mild LE edema, decrease breath sounds bilateral lung bases, elevated JVD - presented with 1 week progressive DOE - echo this admit  LVEF 60-65%, no WMAs, grade II dd, normal RV, normal AVR   - received IV lasix 40mg  x 2 yesterday. I/Os incomplete, at least 3.5 L of uop. Renal  function is stable.  - plan to start SGLT2i in near future however hold for now with pending GI endoscopy.  - remains fluid overloaded, dose IV lasix 40mg  x 1 today. Start oral lasix 40mg  daily tomorrow     Ok for discharge from cardiac standpoint. If outpatient endoscopy planned would need to coordinate a lovenox bridge. We will sign off inpatient care, if still admitted tomorrow follow peripherally.      Dina Rich MD

## 2023-06-19 NOTE — Discharge Summary (Signed)
 Physician Discharge Summary  Chase Lee WGN:[id number] DOB: 28-May-1980 DOA: 06/18/2023  PCP: [facility] GI: [facility] Cardiology: Lee  Admit date: 06/18/2023 Discharge date: 06/19/2023  Admitted From:  Home  Disposition: Home   Recommendations for Outpatient Follow-up:  Follow up with PCP in 1 weeks Follow up with Dr. Jenene Slicker on 07/09/23 as scheduled Follow up with [facility] in 1 week for CBC check and to arrange for outpatient EGD/colonoscopy Please obtain BMP/CBC in one week  Home Health: N/A   Discharge Condition: STABLE   CODE STATUS: FULL DIET: 2 gram sodium restricted heart healthy   Brief Hospitalization Summary: Please see all hospital notes, images, labs for full details of the hospitalization. 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 Laneta Simmers on 04/26/23, hypertension, postop atrial fibrillation, LBBB, acquired thrombophilia, GERD, anemia presented to the ED with chest pain and shortness of breath symptoms.  He said that he had been lifting a mattress yesterday and felt some right-sided chest pain.  He is having pain in his right shoulder.  Patient has had shortness of breath but no cough or fever.  He is also having some gastritis symptoms after eating pizza last night.  His workup in the ED positive for findings of acute heart failure with some pleural effusions and pulmonary edema.  He was given a dose of IV Lasix and started diuresing and is immediately starting to feel better.  He does not have a history of heart failure.  He was seen by the cardiologist and no plans for ischemic testing but he will need an echocardiogram and diuresis.  He was also noted to be anemic with a hemoglobin of 8.5 down from 11 last month.  His fecal  occult blood testing was negative but patient reported having some dark tarry stools.  Hospital Course by listed problems  Acute Heart Failure preserved EF  - new diagnosis - follow up 2D echocardiogram below - excellent response so far to IV furosemide ordered by cardiology - recheck portable CXR in AM  - follow intake/output, weights - Pt has diuresed 3.5L since admission.  - followed electrolytes - cardiology team increase lasix dose to 40 mg daily and started oral lasix 40 mg daily  Filed Weights   06/19/23 0333  Weight: 102.6 kg    Intake/Output Summary (Last 24 hours) at 06/19/2023 1047 Last data filed at 06/18/2023 1336 Gross per 24 hour  Intake 120 ml  Output 2550 ml  Net -2430 ml   Echocardiogram 06/18/23:  LVEF 60-65% with grade 2 DD. The aortic valve has been repaired/replaced. Aortic valve regurgitation is trivial. No aortic stenosis is present. There is a 25 mm On-X mechanical valve present in the aortic position.  Aortic root/ascending aorta has been repaired/replaced and s/p Bentall procedure.    Essential hypertension - cardiology increased lisinopril to 40 mg daily    Atypical Chest Pain  - Resolved - secondary to acute heart failure - symptoms resolved after diuresis   Iron Deficiency anemia - Hg down from 11 to 8 in 1 month - he reports intermittent bouts of melena - he is fully anticoagulated with warfarin with a mechanical valve  - stool guaiac was negative per ED provider - GI consulted, recommended EGD/colonoscopy: pt declined to have done inpatient but will follow up with [facility]  to arrange it outpatient  - started on oral iron sulfate 325 mg daily and vitamin C daily    GERD - exacerbated by pizza meal yesterday - omeprazole 40 mg BID ordered for GI protection    Acquired thrombophilia - warfarin per pharm D managing - follow up with anticoagulation clinic - consulted pharm D for warfarin dosing in hospital   Paroxysmal Atrial Fibrillation -  cardiology team reduced home amiodarone to 200 mg once daily - resume home metoprolol 25 mg BID - remains on warfarin for full anticoagulation    GAD - resumed home anxiolytics   Discharge Diagnoses:  Principal Problem:   Atypical chest pain Active Problems:   Acute heart failure (HCC)   Acquired thrombophilia (HCC)   Iron deficiency anemia   Disorder of hepatic portal vein/Small amount of portal venous gas in the left lobe of the liver   Status post mechanical aortic valve replacement at Age 43   HTN (hypertension)   GERD (gastroesophageal reflux disease)   Paroxysmal atrial fibrillation (HCC)   Anemia   Discharge Instructions:  Allergies as of 06/19/2023   No Known Allergies      Medication List     TAKE these medications    acetaminophen 500 MG tablet Commonly known as: TYLENOL Take 1,000 mg by mouth every 6 (six) hours as needed for mild pain (pain score 1-3).   ALPRAZolam 1 MG tablet Commonly known as: XANAX TAKE 1 TABLET(1 MG) BY MOUTH THREE TIMES DAILY AS NEEDED FOR ANXIETY What changed:  how much to take how to take this when to take this reasons to take this   amiodarone 200 MG tablet Commonly known as: PACERONE Take 1 tablet (200 mg total) by mouth daily. Start taking on: June 20, 2023 What changed: See the new instructions.   ascorbic acid 500 MG tablet Commonly known as: VITAMIN C Take 1 tablet (500 mg total) by mouth daily with breakfast.   aspirin EC 81 MG tablet Take 1 tablet (81 mg total) by mouth daily. Swallow whole.   escitalopram 10 MG tablet Commonly known as: LEXAPRO TAKE 1 TABLET(10 MG) BY MOUTH DAILY What changed: See the new instructions.   ferrous sulfate 325 (65 FE) MG EC tablet Take 1 tablet (325 mg total) by mouth daily with breakfast.   furosemide 40 MG tablet Commonly known as: LASIX Take 1 tablet (40 mg total) by mouth daily. Start taking on: June 20, 2023   lisinopril 40 MG tablet Commonly known as: ZESTRIL Take  1 tablet (40 mg total) by mouth daily. What changed:  medication strength how much to take   metoprolol tartrate 25 MG tablet Commonly known as: LOPRESSOR Take 1 tablet (25 mg total) by mouth 2 (two) times daily.   omeprazole 40 MG capsule Commonly known as: PRILOSEC Take 1 capsule (40 mg total) by mouth 2 (two) times daily. What changed: when to take this   oxycodone 5 MG capsule Commonly known as: OXY-IR Take 1 capsule (5 mg total) by mouth every 4 (four) hours as needed for pain.   rosuvastatin 10 MG tablet Commonly known as: CRESTOR Take 1 tablet (10 mg total) by mouth daily. Start taking on: June 20, 2023   warfarin 5 MG tablet Commonly known as: COUMADIN Take as directed. If you are unsure how to take this medication, talk to your nurse or doctor. Original instructions: Take 1 tablet (5 mg total) by mouth daily.        Follow-up Information  Lee, Orion Modest, MD. Nyra Capes on 07/09/2023.   Specialties: Cardiology, Internal Medicine Why: Hospital Follow Up at 11 am Contact information: [address] [place] [address] [phone]         [facility] Follow up.   Why: Hospital Follow Up Contact information: [address] [place] [address] [phone]         [facility]. Schedule an appointment as soon as possible for a visit in 1 week(s).   Specialty: Gastroenterology Why: Hospital Follow Up to get repeat CBC labs and schedule EGD/colonoscopy Contact information: [address] [place] [address] [phone]               No Known Allergies Allergies as of 06/19/2023   No Known Allergies      Medication List     TAKE these medications    acetaminophen 500 MG tablet Commonly known as: TYLENOL Take 1,000 mg by mouth every 6 (six) hours as needed for mild pain (pain score 1-3).   ALPRAZolam 1 MG tablet Commonly known as: XANAX TAKE 1  TABLET(1 MG) BY MOUTH THREE TIMES DAILY AS NEEDED FOR ANXIETY What changed:  how much to take how to take this when to take this reasons to take this   amiodarone 200 MG tablet Commonly known as: PACERONE Take 1 tablet (200 mg total) by mouth daily. Start taking on: June 20, 2023 What changed: See the new instructions.   ascorbic acid 500 MG tablet Commonly known as: VITAMIN C Take 1 tablet (500 mg total) by mouth daily with breakfast.   aspirin EC 81 MG tablet Take 1 tablet (81 mg total) by mouth daily. Swallow whole.   escitalopram 10 MG tablet Commonly known as: LEXAPRO TAKE 1 TABLET(10 MG) BY MOUTH DAILY What changed: See the new instructions.   ferrous sulfate 325 (65 FE) MG EC tablet Take 1 tablet (325 mg total) by mouth daily with breakfast.   furosemide 40 MG tablet Commonly known as: LASIX Take 1 tablet (40 mg total) by mouth daily. Start taking on: June 20, 2023   lisinopril 40 MG tablet Commonly known as: ZESTRIL Take 1 tablet (40 mg total) by mouth daily. What changed:  medication strength how much to take   metoprolol tartrate 25 MG tablet Commonly known as: LOPRESSOR Take 1 tablet (25 mg total) by mouth 2 (two) times daily.   omeprazole 40 MG capsule Commonly known as: PRILOSEC Take 1 capsule (40 mg total) by mouth 2 (two) times daily. What changed: when to take this   oxycodone 5 MG capsule Commonly known as: OXY-IR Take 1 capsule (5 mg total) by mouth every 4 (four) hours as needed for pain.   rosuvastatin 10 MG tablet Commonly known as: CRESTOR Take 1 tablet (10 mg total) by mouth daily. Start taking on: June 20, 2023   warfarin 5 MG tablet Commonly known as: COUMADIN Take as directed. If you are unsure how to take this medication, talk to your nurse or doctor. Original instructions: Take 1 tablet (5 mg total) by mouth daily.        Procedures/Studies: DG Chest Port 1 View Result Date: 06/19/2023 CLINICAL DATA:  43 year old male  with heart failure. EXAM: PORTABLE CHEST 1 VIEW COMPARISON:  CT Chest, Abdomen, and Pelvis today are reported separately. Yesterday and earlier. FINDINGS: Portable AP upright view at 0635 hours. Stable cardiomegaly and mediastinal contours. Prior sternotomy. Visualized tracheal air column  is within normal limits. No pneumothorax, pulmonary edema or consolidation. Regressed vascular congestion compared to portable chest yesterday. Small layering pleural effusions on CT yesterday are not apparent now. Stable visualized osseous structures. IMPRESSION: Stable cardiomegaly. Regression of pulmonary edema since yesterday. Small pleural effusions not evident now. Electronically Signed   By: Odessa Fleming M.D.   On: 06/19/2023 06:49   ECHOCARDIOGRAM COMPLETE Result Date: 06/18/2023    ECHOCARDIOGRAM REPORT   Patient Name:   Chase Lee Daye Date of Exam: 06/18/2023 Medical Rec #:  [id number]            Height:       67.0 in Accession #:    [id number]           Weight:       228.0 lb Date of Birth:  [birth year]             BSA:          2.138 m Patient Age:    43 years             BP:           148/80 mmHg Patient Gender: M                    HR:           54 bpm. [facility] Procedure: 2D Echo, Cardiac Doppler, Color Doppler and Intracardiac            Opacification Agent (Both Spectral and Color Flow Doppler were            utilized during procedure). Indications:    Chest Pain R07.9  History:        Patient has prior history of Echocardiogram examinations, most                 recent 04/17/2023. Risk Factors:Hypertension and Former Smoker.                 Aortic Valve: unknown mechanical valve is present in the aortic                 position. Procedure Date: At age 38 years old.                  Aortic Valve: 25 mm On-X mechanical valve is present in the                 aortic position.  Sonographer:    Celesta Gentile RCS Referring Phys: [id number] Dorothe Pea BRANCH IMPRESSIONS  1. Left ventricular ejection  fraction, by estimation, is 60 to 65%. The left ventricle has normal function. The left ventricle has no regional wall motion abnormalities. The left ventricular internal cavity size was mildly to moderately dilated. Left ventricular diastolic parameters are consistent with Grade II diastolic dysfunction (pseudonormalization). Elevated left atrial pressure.  2. Right ventricular systolic function is normal. The right ventricular size is mildly enlarged. There is mildly elevated pulmonary artery systolic pressure. The estimated right ventricular systolic pressure is 36.7 mmHg.  3. Left atrial size was severely dilated.  4. Right atrial size was mild to moderately dilated.  5. The mitral valve is normal in structure. Trivial mitral valve regurgitation. No evidence of mitral stenosis.  6. The aortic valve has been repaired/replaced. Aortic valve regurgitation is trivial. No aortic stenosis is present. There is a 25 mm On-X mechanical valve present in the aortic position.  7. Aortic root/ascending aorta has been  repaired/replaced and s/p Bentall procedure.  8. The inferior vena cava is dilated in size with <50% respiratory variability, suggesting right atrial pressure of 15 mmHg.  9. Increased flow velocities may be secondary to anemia, thyrotoxicosis, hyperdynamic or high flow state. FINDINGS  Left Ventricle: Left ventricular ejection fraction, by estimation, is 60 to 65%. The left ventricle has normal function. The left ventricle has no regional wall motion abnormalities. Definity contrast agent was given IV to delineate the left ventricular  endocardial borders. Strain was performed and the global longitudinal strain is indeterminate. The left ventricular internal cavity size was mildly to moderately dilated. There is no left ventricular hypertrophy. Left ventricular diastolic parameters are consistent with Grade II diastolic dysfunction (pseudonormalization). Elevated left atrial pressure. Right Ventricle: The right  ventricular size is mildly enlarged. No increase in right ventricular wall thickness. Right ventricular systolic function is normal. There is mildly elevated pulmonary artery systolic pressure. The tricuspid regurgitant velocity is 2.33 m/s, and with an assumed right atrial pressure of 15 mmHg, the estimated right ventricular systolic pressure is 36.7 mmHg. Left Atrium: Left atrial size was severely dilated. Right Atrium: Right atrial size was mild to moderately dilated. Pericardium: There is no evidence of pericardial effusion. Mitral Valve: The mitral valve is normal in structure. Trivial mitral valve regurgitation. No evidence of mitral valve stenosis. Tricuspid Valve: The tricuspid valve is normal in structure. Tricuspid valve regurgitation is trivial. No evidence of tricuspid stenosis. Aortic Valve: The aortic valve has been repaired/replaced. Aortic valve regurgitation is trivial. No aortic stenosis is present. Aortic valve mean gradient measures 9.0 mmHg. Aortic valve peak gradient measures 19.2 mmHg. Aortic valve area, by VTI measures 2.39 cm. There is a 25 mm On-X mechanical valve present in the aortic position. Pulmonic Valve: The pulmonic valve was not well visualized. Pulmonic valve regurgitation is trivial. No evidence of pulmonic stenosis. Aorta: The aortic root/ascending aorta has been repaired/replaced and s/p Bentall procedure. Venous: The inferior vena cava is dilated in size with less than 50% respiratory variability, suggesting right atrial pressure of 15 mmHg. IAS/Shunts: No atrial level shunt detected by color flow Doppler. Additional Comments: 3D was performed not requiring image post processing on an independent workstation and was indeterminate.  LEFT VENTRICLE PLAX 2D LVIDd:         6.00 cm      Diastology LVIDs:         3.90 cm      LV e' medial:    5.00 cm/s LV PW:         1.00 cm      LV E/e' medial:  24.0 LV IVS:        1.00 cm      LV e' lateral:   12.80 cm/s LVOT diam:     2.00 cm       LV E/e' lateral: 9.4 LV SV:         118 LV SV Index:   55 LVOT Area:     3.14 cm  LV Volumes (MOD) LV vol d, MOD A2C: 132.0 ml LV vol d, MOD A4C: 160.0 ml LV vol s, MOD A2C: 57.6 ml LV vol s, MOD A4C: 68.9 ml LV SV MOD A2C:     74.4 ml LV SV MOD A4C:     160.0 ml LV SV MOD BP:      86.1 ml RIGHT VENTRICLE RV S prime:     9.46 cm/s TAPSE (M-mode): 1.7 cm LEFT ATRIUM  Index        RIGHT ATRIUM           Index LA diam:        4.50 cm  2.10 cm/m   RA Area:     26.10 cm LA Vol (A2C):   144.0 ml 67.35 ml/m  RA Volume:   85.00 ml  39.76 ml/m LA Vol (A4C):   103.0 ml 48.17 ml/m LA Biplane Vol: 129.0 ml 60.34 ml/m  AORTIC VALVE AV Area (Vmax):    2.42 cm AV Area (Vmean):   2.57 cm AV Area (VTI):     2.39 cm AV Vmax:           219.00 cm/s AV Vmean:          137.000 cm/s AV VTI:            0.496 m AV Peak Grad:      19.2 mmHg AV Mean Grad:      9.0 mmHg LVOT Vmax:         169.00 cm/s LVOT Vmean:        112.000 cm/s LVOT VTI:          0.377 m LVOT/AV VTI ratio: 0.76  AORTA Ao Root diam: 3.40 cm MITRAL VALVE                TRICUSPID VALVE MV Area (PHT): 4.06 cm     TR Peak grad:   21.7 mmHg MV Decel Time: 187 msec     TR Vmax:        233.00 cm/s MV E velocity: 120.00 cm/s MV A velocity: 47.10 cm/s   SHUNTS MV E/A ratio:  2.55         Systemic VTI:  0.38 m                             Systemic Diam: 2.00 cm Chase Lee Electronically signed by Winfield Rast Lee Signature Date/Time: 06/18/2023/4:13:37 PM    Final    DG Chest Portable 1 View Result Date: 06/18/2023 CLINICAL DATA:  Chest pain EXAM: PORTABLE CHEST 1 VIEW COMPARISON:  05/21/2023 FINDINGS: The cardio pericardial silhouette is enlarged. There is pulmonary vascular congestion without overt airspace pulmonary edema. No focal airspace consolidation or substantial pleural effusion. No acute bony abnormality. Telemetry leads overlie the chest. IMPRESSION: Enlargement of the cardiopericardial silhouette with pulmonary vascular  congestion. Component of interstitial edema not excluded. Electronically Signed   By: Kennith Center M.D.   On: 06/18/2023 09:29   CT Angio Chest/Abd/Pel for Dissection W and/or Wo Contrast Result Date: 06/18/2023 CLINICAL DATA:  Acute aortic syndrome suspected, chest and shoulder pain for 1 week, recent aortic aneurysm repair EXAM: CT ANGIOGRAPHY CHEST, ABDOMEN AND PELVIS TECHNIQUE: Non-contrast CT of the chest was initially obtained. Multidetector CT imaging through the chest, abdomen and pelvis was performed using the standard protocol during bolus administration of intravenous contrast. Multiplanar reconstructed images and MIPs were obtained and reviewed to evaluate the vascular anatomy. RADIATION DOSE REDUCTION: This exam was performed according to the departmental dose-optimization program which includes automated exposure control, adjustment of the mA and/or kV according to patient size and/or use of iterative reconstruction technique. CONTRAST:  75mL OMNIPAQUE IOHEXOL 350 MG/ML SOLN COMPARISON:  CT chest angiogram, 04/19/2023 FINDINGS: CTA CHEST FINDINGS VASCULAR Aorta: Satisfactory opacification of the aorta. 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 Intermediate attenuation collection  about the graft, without evidence of contrast opacification to suggest dehiscence (series 5, image 40). Mild enlargement of the remaining anatomic aortic arch measuring up to 3.5 cm in caliber, with normal caliber of the descending thoracic aorta. Cardiovascular: No evidence of pulmonary embolism on limited non-tailored examination. Global cardiomegaly. No pericardial effusion. Review of the MIP images confirms the above findings. NON VASCULAR Mediastinum/Nodes: Prominent subcentimeter mediastinal lymph nodes. Thyroid gland, trachea, and esophagus demonstrate no significant findings. Lungs/Pleura: Small bilateral pleural effusions and associated atelectasis or  consolidation, with interlobular septal thickening throughout the lungs. Musculoskeletal: Status post median sternotomy. No acute osseous findings. Review of the MIP images confirms the above findings. CTA ABDOMEN AND PELVIS FINDINGS VASCULAR Normal contour and caliber of the abdominal aorta. No evidence of aneurysm, dissection, or other acute aortic pathology. Standard branching pattern of the abdominal aorta with solitary bilateral renal arteries. 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 The vessel and its proximal branches remain opacified. Scattered atherosclerosis of the bilateral common iliac arteries. Review of the MIP images confirms the above findings. NON-VASCULAR Hepatobiliary: No solid liver abnormality is seen. Hepatic steatosis. No gallstones, gallbladder wall thickening, or biliary dilatation. Pancreas: Unremarkable. No pancreatic ductal dilatation or surrounding inflammatory changes. Spleen: Normal in size without significant abnormality. Adrenals/Urinary Tract: Adrenal glands are unremarkable. Kidneys are normal, without renal calculi, solid lesion, or hydronephrosis. Bladder is unremarkable. Stomach/Bowel: Stomach is within normal limits. Appendix appears normal. No evidence of bowel wall thickening, distention, or inflammatory changes. Lymphatic: No enlarged abdominal or pelvic lymph nodes. Reproductive: No mass or other significant abnormality. Other: No abdominal wall hernia. Mild anasarca. Small volume simple fluid attenuation ascites. Musculoskeletal: No acute osseous findings. IMPRESSION: 1. 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 Intermediate attenuation collection about the graft, most likely postoperative, without evidence of contrast opacification to suggest dehiscence. 2. Mild enlargement of the remaining anatomic aortic arch  measuring up to 3.5 cm in caliber, with normal caliber of the descending thoracic aorta. 3. Normal contour and caliber of the abdominal aorta. No evidence of aneurysm, dissection, or other acute aortic pathology. 4. 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 The vessel and its proximal branches remain opacified. 5. Small bilateral pleural effusions and pulmonary edema. Small volume ascites and anasarca. 6. Global cardiomegaly. 7. Hepatic steatosis. Electronically Signed   By: Jearld Lesch M.D.   On: 06/18/2023 09:23   DG Chest 2 View Result Date: 05/21/2023 CLINICAL DATA:  Status post AVR EXAM: CHEST - 2 VIEW COMPARISON:  X-ray 04/28/2023 FINDINGS: Linear opacity at the left lung base likely scar or atelectasis. No consolidation, pneumothorax or edema. No effusion. Slight elevation of the right hemidiaphragm. Sternal wires. Normal cardiopericardial silhouette. Left upper chest overlapping artifact identified. IMPRESSION: Postop chest.  Slight elevation of the right hemidiaphragm. Mild left basilar scar or atelectasis. Electronically Signed   By: Karen Kays M.D.   On: 05/21/2023 16:42     Subjective: Pt reports that he feels well after diuresing 3.5L of fluid and wants to eat and go home.  He says that he will follow up with GI outpatient but not willing to wait 5 more days to begin prep for EGD and colonoscopy.  He denies currently having any melenotic stools.  Discharge Exam: Vitals:   06/19/23 0333 06/19/23 0811  BP: (!) 143/79  Pulse: (!) 56 61  Resp: 17   Temp: 98.3 F (36.8 C)   SpO2: 95%    Vitals:   06/18/23 1833 06/18/23 2024 06/19/23 0333 06/19/23 0811  BP: (!) 168/83 (!) 152/92 (!) 143/79   Pulse: (!) 58 (!) 58 (!) 56 61  Resp: 18 19 17    Temp: 97.7 F (36.5 C) 98.7 F (37.1 C) 98.3 F (36.8 C)   TempSrc: Oral Oral Oral   SpO2: 100% 96% 95%   Weight:   102.6 kg    General: Pt is alert, awake, not in acute  distress Cardiovascular: normal S1/S2 + Respiratory: CTA bilaterally, no wheezing, no rhonchi Abdominal: Soft, NT, ND, bowel sounds + Extremities: trace pretibial edema, no cyanosis   The results of significant diagnostics from this hospitalization (including imaging, microbiology, ancillary and laboratory) are listed below for reference.    Microbiology: No results found for this or any previous visit (from the past 240 hours).   Labs: BNP (last 3 results) Recent Labs    06/18/23 0905 06/19/23 0431  BNP 616.0* 344.0*   Basic Metabolic Panel: Recent Labs  Lab 06/18/23 0805 06/18/23 0824 06/19/23 0431  NA 136 140 137  K 3.8 3.9 3.8  CL 101 103 101  CO2 24  --  28  GLUCOSE 167* 152* 120*  BUN 18 17 21*  CREATININE 1.14 1.20 1.18  CALCIUM 8.6*  --  8.9  MG  --   --  2.1   Liver Function Tests: Recent Labs  Lab 06/18/23 0805  AST 25  ALT 29  ALKPHOS 94  BILITOT 0.5  PROT 6.8  ALBUMIN 3.7   Recent Labs  Lab 06/18/23 0805  LIPASE 25   No results for input(s): "AMMONIA" in the last 168 hours. CBC: Recent Labs  Lab 06/18/23 0805 06/18/23 0824 06/19/23 0431  WBC 6.8  --  6.6  NEUTROABS 5.2  --   --   HGB 8.5* 9.5* 8.9*  HCT 27.8* 28.0* 29.7*  MCV 80.6  --  79.4*  PLT 261  --  278   Cardiac Enzymes: No results for input(s): "CKTOTAL", "CKMB", "CKMBINDEX", "TROPONINI" in the last 168 hours. BNP: Invalid input(s): "POCBNP" CBG: No results for input(s): "GLUCAP" in the last 168 hours. D-Dimer No results for input(s): "DDIMER" in the last 72 hours. Hgb A1c No results for input(s): "HGBA1C" in the last 72 hours. Lipid Profile Recent Labs    06/19/23 0431  CHOL 171  HDL 41  LDLCALC 111*  TRIG 93  CHOLHDL 4.2   Thyroid function studies No results for input(s): "TSH", "T4TOTAL", "T3FREE", "THYROIDAB" in the last 72 hours.  Invalid input(s): "FREET3" Anemia work up Recent Labs    06/18/23 1138  VITAMINB12 493  FOLATE 9.3  FERRITIN 11*  TIBC  439  IRON 17*  RETICCTPCT 1.9   Urinalysis    Component Value Date/Time   COLORURINE AMBER (A) 06/11/2021 0640   APPEARANCEUR CLEAR 06/11/2021 0640   LABSPEC 1.034 (H) 06/11/2021 0640   PHURINE 6.0 06/11/2021 0640   GLUCOSEU NEGATIVE 06/11/2021 0640   HGBUR SMALL (A) 06/11/2021 0640   BILIRUBINUR negative 01/26/2022 1552   KETONESUR negative 01/26/2022 1552   KETONESUR 5 (A) 06/11/2021 0640   PROTEINUR negative 01/26/2022 1552   PROTEINUR 30 (A) 06/11/2021 0640   UROBILINOGEN 0.2 01/26/2022 1552   NITRITE Negative 01/26/2022 1552   NITRITE NEGATIVE 06/11/2021 0640   LEUKOCYTESUR Negative 01/26/2022 1552   LEUKOCYTESUR NEGATIVE 06/11/2021 0640   Sepsis Labs  Recent Labs  Lab 06/18/23 0805 06/19/23 0431  WBC 6.8 6.6   Microbiology No results found for this or any previous visit (from the past 240 hours).  Time coordinating discharge: 40 mins  SIGNED:  Standley Dakins, MD  [facility] 06/19/2023, 10:39 AM How to contact the [facility] Attending or Consulting provider 7A - 7P or covering provider during after hours 7P -7A, for this patient?  Check the care team in [facility] and look for a) attending/consulting [facility] provider listed and b) the [facility] team listed Log into [email or web address] and use [facility] universal password to access. If you do not have the password, please contact the hospital operator. Locate the [facility] provider you are looking for under [facility] and page to a number that you can be directly reached. If you still have difficulty reaching the provider, please page the [facility] (Director on Call) for the Hospitalists listed on amion for assistance.

## 2023-06-19 NOTE — Plan of Care (Signed)

## 2023-06-19 NOTE — Telephone Encounter (Addendum)
 Patient likely discharging from the hospital later this afternoon.  We saw him during admission for iron deficiency anemia.  Courtney: Please arrange CBC in 1 week.  Patient prefers [facility].  Kenney Houseman:  We need to arrange EGD and colonoscopy with Dr. Levon Hedger. He will need to hold warfarin for 5 days prior to procedure and bridge with Lovenox.  Will need to reach the Coumadin clinic and have them arrange this with the patient.  Will also need to hold iron x 7 days prior

## 2023-06-19 NOTE — Progress Notes (Signed)
 Subjective: Wants to go home. Reports stool was normal in color this morning. Had dark stools for 4 days, but states they were never black. Denies NISADs. Chronically on omeprazole daily which he reports works better for him than anything else. Had been having some upper abdominal discomfort at his scar line that seemed a bit worse recently, but no worse today. Wants to eat and go home. Cardiology said he could likely go today.   Objective: Vital signs in last 24 hours: Temp:  [97.7 F (36.5 C)-98.7 F (37.1 C)] 98.3 F (36.8 C) (04/01 0333) Pulse Rate:  [52-61] 61 (04/01 0811) Resp:  [17-23] 17 (04/01 0333) BP: (129-181)/(77-102) 143/79 (04/01 0333) SpO2:  [90 %-100 %] 95 % (04/01 0333) Weight:  [102.6 kg] 102.6 kg (04/01 0333) Last BM Date : 06/17/23 General:   Alert and oriented, pleasant, NAD.  Head:  Normocephalic and atraumatic. Eyes:  No icterus, sclera clear. Conjuctiva pink.  Mouth:  Without lesions, mucosa pink and moist.  Abdomen:  Bowel sounds present, soft, non-tender, non-distended. Mild TTP across upper abdomen.  Msk:  Symmetrical without gross deformities. Normal posture. Neurologic:  Alert and  oriented x4;  grossly normal neurologically. Psych: Normal mood and affect.  Intake/Output from previous day: 03/31 0701 - 04/01 0700 In: 120 [P.O.:120] Out: 3525 [Urine:3525] Intake/Output this shift: No intake/output data recorded.  Lab Results: Recent Labs    06/18/23 0805 06/18/23 0824 06/19/23 0431  WBC 6.8  --  6.6  HGB 8.5* 9.5* 8.9*  HCT 27.8* 28.0* 29.7*  PLT 261  --  278   BMET Recent Labs    06/18/23 0805 06/18/23 0824 06/19/23 0431  NA 136 140 137  K 3.8 3.9 3.8  CL 101 103 101  CO2 24  --  28  GLUCOSE 167* 152* 120*  BUN 18 17 21*  CREATININE 1.14 1.20 1.18  CALCIUM 8.6*  --  8.9   LFT Recent Labs    06/18/23 0805  PROT 6.8  ALBUMIN 3.7  AST 25  ALT 29  ALKPHOS 94  BILITOT 0.5   PT/INR Recent Labs    06/18/23 0805  06/19/23 0431  LABPROT 28.7* 25.6*  INR 2.7* 2.3*     Studies/Results: DG Chest Port 1 View Result Date: 06/19/2023 CLINICAL DATA:  43 year old male with heart failure. EXAM: PORTABLE CHEST 1 VIEW COMPARISON:  CT Chest, Abdomen, and Pelvis today are reported separately. Yesterday and earlier. FINDINGS: Portable AP upright view at 0635 hours. Stable cardiomegaly and mediastinal contours. Prior sternotomy. Visualized tracheal air column is within normal limits. No pneumothorax, pulmonary edema or consolidation. Regressed vascular congestion compared to portable chest yesterday. Small layering pleural effusions on CT yesterday are not apparent now. Stable visualized osseous structures. IMPRESSION: Stable cardiomegaly. Regression of pulmonary edema since yesterday. Small pleural effusions not evident now. Electronically Signed   By: Odessa Fleming M.D.   On: 06/19/2023 06:49   ECHOCARDIOGRAM COMPLETE Result Date: 06/18/2023    ECHOCARDIOGRAM REPORT   Patient Name:   Chase Lee Date of Exam: 06/18/2023 Medical Rec #:  [id number]            Height:       67.0 in Accession #:    [id number]           Weight:       228.0 lb Date of Birth:  1980/05/13             BSA:  2.138 m Patient Age:    43 years             BP:           148/80 mmHg Patient Gender: M                    HR:           54 bpm. [facility] Procedure: 2D Echo, Cardiac Doppler, Color Doppler and Intracardiac            Opacification Agent (Both Spectral and Color Flow Doppler were            utilized during procedure). Indications:    Chest Pain R07.9  History:        Patient has prior history of Echocardiogram examinations, most                 recent 04/17/2023. Risk Factors:Hypertension and Former Smoker.                 Aortic Valve: unknown mechanical valve is present in the aortic                 position. Procedure Date: At age 68 years old.                  Aortic Valve: 25 mm On-X mechanical valve is present in the                  aortic position.  Sonographer:    Celesta Gentile RCS Referring Phys: [id number] Dorothe Pea BRANCH IMPRESSIONS  1. Left ventricular ejection fraction, by estimation, is 60 to 65%. The left ventricle has normal function. The left ventricle has no regional wall motion abnormalities. The left ventricular internal cavity size was mildly to moderately dilated. Left ventricular diastolic parameters are consistent with Grade II diastolic dysfunction (pseudonormalization). Elevated left atrial pressure.  2. Right ventricular systolic function is normal. The right ventricular size is mildly enlarged. There is mildly elevated pulmonary artery systolic pressure. The estimated right ventricular systolic pressure is 36.7 mmHg.  3. Left atrial size was severely dilated.  4. Right atrial size was mild to moderately dilated.  5. The mitral valve is normal in structure. Trivial mitral valve regurgitation. No evidence of mitral stenosis.  6. The aortic valve has been repaired/replaced. Aortic valve regurgitation is trivial. No aortic stenosis is present. There is a 25 mm On-X mechanical valve present in the aortic position.  7. Aortic root/ascending aorta has been repaired/replaced and s/p Bentall procedure.  8. The inferior vena cava is dilated in size with <50% respiratory variability, suggesting right atrial pressure of 15 mmHg.  9. Increased flow velocities may be secondary to anemia, thyrotoxicosis, hyperdynamic or high flow state. FINDINGS  Left Ventricle: Left ventricular ejection fraction, by estimation, is 60 to 65%. The left ventricle has normal function. The left ventricle has no regional wall motion abnormalities. Definity contrast agent was given IV to delineate the left ventricular  endocardial borders. Strain was performed and the global longitudinal strain is indeterminate. The left ventricular internal cavity size was mildly to moderately dilated. There is no left ventricular hypertrophy. Left ventricular  diastolic parameters are consistent with Grade II diastolic dysfunction (pseudonormalization). Elevated left atrial pressure. Right Ventricle: The right ventricular size is mildly enlarged. No increase in right ventricular wall thickness. Right ventricular systolic function is normal. There is mildly elevated pulmonary artery systolic pressure. The tricuspid regurgitant velocity is  2.33 m/s, and with an assumed right atrial pressure of 15 mmHg, the estimated right ventricular systolic pressure is 36.7 mmHg. Left Atrium: Left atrial size was severely dilated. Right Atrium: Right atrial size was mild to moderately dilated. Pericardium: There is no evidence of pericardial effusion. Mitral Valve: The mitral valve is normal in structure. Trivial mitral valve regurgitation. No evidence of mitral valve stenosis. Tricuspid Valve: The tricuspid valve is normal in structure. Tricuspid valve regurgitation is trivial. No evidence of tricuspid stenosis. Aortic Valve: The aortic valve has been repaired/replaced. Aortic valve regurgitation is trivial. No aortic stenosis is present. Aortic valve mean gradient measures 9.0 mmHg. Aortic valve peak gradient measures 19.2 mmHg. Aortic valve area, by VTI measures 2.39 cm. There is a 25 mm On-X mechanical valve present in the aortic position. Pulmonic Valve: The pulmonic valve was not well visualized. Pulmonic valve regurgitation is trivial. No evidence of pulmonic stenosis. Aorta: The aortic root/ascending aorta has been repaired/replaced and s/p Bentall procedure. Venous: The inferior vena cava is dilated in size with less than 50% respiratory variability, suggesting right atrial pressure of 15 mmHg. IAS/Shunts: No atrial level shunt detected by color flow Doppler. Additional Comments: 3D was performed not requiring image post processing on an independent workstation and was indeterminate.  LEFT VENTRICLE PLAX 2D LVIDd:         6.00 cm      Diastology LVIDs:         3.90 cm      LV e'  medial:    5.00 cm/s LV PW:         1.00 cm      LV E/e' medial:  24.0 LV IVS:        1.00 cm      LV e' lateral:   12.80 cm/s LVOT diam:     2.00 cm      LV E/e' lateral: 9.4 LV SV:         118 LV SV Index:   55 LVOT Area:     3.14 cm  LV Volumes (MOD) LV vol d, MOD A2C: 132.0 ml LV vol d, MOD A4C: 160.0 ml LV vol s, MOD A2C: 57.6 ml LV vol s, MOD A4C: 68.9 ml LV SV MOD A2C:     74.4 ml LV SV MOD A4C:     160.0 ml LV SV MOD BP:      86.1 ml RIGHT VENTRICLE RV S prime:     9.46 cm/s TAPSE (M-mode): 1.7 cm LEFT ATRIUM              Index        RIGHT ATRIUM           Index LA diam:        4.50 cm  2.10 cm/m   RA Area:     26.10 cm LA Vol (A2C):   144.0 ml 67.35 ml/m  RA Volume:   85.00 ml  39.76 ml/m LA Vol (A4C):   103.0 ml 48.17 ml/m LA Biplane Vol: 129.0 ml 60.34 ml/m  AORTIC VALVE AV Area (Vmax):    2.42 cm AV Area (Vmean):   2.57 cm AV Area (VTI):     2.39 cm AV Vmax:           219.00 cm/s AV Vmean:          137.000 cm/s AV VTI:            0.496 m AV Peak Grad:  19.2 mmHg AV Mean Grad:      9.0 mmHg LVOT Vmax:         169.00 cm/s LVOT Vmean:        112.000 cm/s LVOT VTI:          0.377 m LVOT/AV VTI ratio: 0.76  AORTA Ao Root diam: 3.40 cm MITRAL VALVE                TRICUSPID VALVE MV Area (PHT): 4.06 cm     TR Peak grad:   21.7 mmHg MV Decel Time: 187 msec     TR Vmax:        233.00 cm/s MV E velocity: 120.00 cm/s MV A velocity: 47.10 cm/s   SHUNTS MV E/A ratio:  2.55         Systemic VTI:  0.38 m                             Systemic Diam: 2.00 cm Vishnu Priya Mallipeddi Electronically signed by Winfield Rast Mallipeddi Signature Date/Time: 06/18/2023/4:13:37 PM    Final    DG Chest Portable 1 View Result Date: 06/18/2023 CLINICAL DATA:  Chest pain EXAM: PORTABLE CHEST 1 VIEW COMPARISON:  05/21/2023 FINDINGS: The cardio pericardial silhouette is enlarged. There is pulmonary vascular congestion without overt airspace pulmonary edema. No focal airspace consolidation or substantial pleural  effusion. No acute bony abnormality. Telemetry leads overlie the chest. IMPRESSION: Enlargement of the cardiopericardial silhouette with pulmonary vascular congestion. Component of interstitial edema not excluded. Electronically Signed   By: Kennith Center M.D.   On: 06/18/2023 09:29   CT Angio Chest/Abd/Pel for Dissection W and/or Wo Contrast Result Date: 06/18/2023 CLINICAL DATA:  Acute aortic syndrome suspected, chest and shoulder pain for 1 week, recent aortic aneurysm repair EXAM: CT ANGIOGRAPHY CHEST, ABDOMEN AND PELVIS TECHNIQUE: Non-contrast CT of the chest was initially obtained. Multidetector CT imaging through the chest, abdomen and pelvis was performed using the standard protocol during bolus administration of intravenous contrast. Multiplanar reconstructed images and MIPs were obtained and reviewed to evaluate the vascular anatomy. RADIATION DOSE REDUCTION: This exam was performed according to the departmental dose-optimization program which includes automated exposure control, adjustment of the mA and/or kV according to patient size and/or use of iterative reconstruction technique. CONTRAST:  75mL OMNIPAQUE IOHEXOL 350 MG/ML SOLN COMPARISON:  CT chest angiogram, 04/19/2023 FINDINGS: CTA CHEST FINDINGS VASCULAR Aorta: Satisfactory opacification of the aorta. 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 Intermediate attenuation collection about the graft, without evidence of contrast opacification to suggest dehiscence (series 5, image 40). Mild enlargement of the remaining anatomic aortic arch measuring up to 3.5 cm in caliber, with normal caliber of the descending thoracic aorta. Cardiovascular: No evidence of pulmonary embolism on limited non-tailored examination. Global cardiomegaly. No pericardial effusion. Review of the MIP images confirms the above findings. NON VASCULAR Mediastinum/Nodes: Prominent subcentimeter mediastinal lymph  nodes. Thyroid gland, trachea, and esophagus demonstrate no significant findings. Lungs/Pleura: Small bilateral pleural effusions and associated atelectasis or consolidation, with interlobular septal thickening throughout the lungs. Musculoskeletal: Status post median sternotomy. No acute osseous findings. Review of the MIP images confirms the above findings. CTA ABDOMEN AND PELVIS FINDINGS VASCULAR Normal contour and caliber of the abdominal aorta. No evidence of aneurysm, dissection, or other acute aortic pathology. Standard branching pattern of the abdominal aorta with solitary bilateral renal arteries. 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 The vessel and its proximal branches remain opacified. Scattered atherosclerosis of the bilateral common iliac arteries. Review of the MIP images confirms the above findings. NON-VASCULAR Hepatobiliary: No solid liver abnormality is seen. Hepatic steatosis. No gallstones, gallbladder wall thickening, or biliary dilatation. Pancreas: Unremarkable. No pancreatic ductal dilatation or surrounding inflammatory changes. Spleen: Normal in size without significant abnormality. Adrenals/Urinary Tract: Adrenal glands are unremarkable. Kidneys are normal, without renal calculi, solid lesion, or hydronephrosis. Bladder is unremarkable. Stomach/Bowel: Stomach is within normal limits. Appendix appears normal. No evidence of bowel wall thickening, distention, or inflammatory changes. Lymphatic: No enlarged abdominal or pelvic lymph nodes. Reproductive: No mass or other significant abnormality. Other: No abdominal wall hernia. Mild anasarca. Small volume simple fluid attenuation ascites. Musculoskeletal: No acute osseous findings. IMPRESSION: 1. 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 Intermediate attenuation collection about  the graft, most likely postoperative, without evidence of contrast opacification to suggest dehiscence. 2. Mild enlargement of the remaining anatomic aortic arch measuring up to 3.5 cm in caliber, with normal caliber of the descending thoracic aorta. 3. Normal contour and caliber of the abdominal aorta. No evidence of aneurysm, dissection, or other acute aortic pathology. 4. 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 The vessel and its proximal branches remain opacified. 5. Small bilateral pleural effusions and pulmonary edema. Small volume ascites and anasarca. 6. Global cardiomegaly. 7. Hepatic steatosis. Electronically Signed   By: Jearld Lesch M.D.   On: 06/18/2023 09:23    Assessment: 43 y.o. 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  He was also found to have worsening anemia, but heme-negative stools and GI was consulted.  Acute on chronic anemia: Hemoglobin 8.5, down from 11 in February 2025.  Baseline appears to be in the 10-11 range.  He was found to have iron deficiency with ferritin 11, iron 17, saturation 4%.  Notes upper abdominal pain around his scar line that had increased recently and also noted darker stools for the last 4 days, but denies them being black.  Heme-negative in the ED.  No prior EGD or colonoscopy.  Differential is broad in the setting of warfarin as he could be bleeding from essentially anywhere in his GI tract.  There is a notable concern for possible colon mass in the past though CTA this admission did not  mention any concerning findings.    We recommended EGD and colonoscopy during this admission once optimized from a cardiac standpoint.  In light of warfarin, this would need to be held for 5 days (last dose yesterday) and patient bridged with heparin.  I had a long discussion with the patient today about this regarding potential risk for worsening anemia requiring hospitalization, blood transfusion, etc. if he were to discharge prior to endoscopic evaluation, but patient states that he is willing to take this risk as he does not want to stay in the hospital for 5 more days. I did discuss his case with cardiology who agrees that  it would be reasonable to bridge patient the outpatient setting with Lovenox prior to procedures.  His hemoglobin is fairly stable today at 8.9 and he has had no overt GI bleeding. Reports his stools were normal this morning.     Plan: Continue PPI twice daily at discharge.  Can resume omeprazole per patient's request.  Start ferrous sulfate 325 mg daily at discharge CBC in 1 week We will arrange outpatient EGD and colonoscopy with Dr. Levon Hedger. He will need to hold warfarin for 5 days and bridge with Lovenox.  Will reach the Coumadin clinic to help assist with this.   LOS: 0 days    06/19/2023, 9:54 AM   Ermalinda Memos, PA-C [facility]

## 2023-06-19 NOTE — Progress Notes (Signed)
 PHARMACY - ANTICOAGULATION CONSULT NOTE  Pharmacy Consult for warfarin Indication: atrial fibrillation/aortic valve replacement   No Known Allergies  Patient Measurements: Weight: 102.6 kg (226 lb 3.1 oz)  Vital Signs: Temp: 98.3 F (36.8 C) (04/01 0333) Temp Source: Oral (04/01 0333) BP: 143/79 (04/01 0333) Pulse Rate: 56 (04/01 0333)  Labs: Recent Labs    06/18/23 0805 06/18/23 0824 06/18/23 0953 06/19/23 0431  HGB 8.5* 9.5*  --  8.9*  HCT 27.8* 28.0*  --  29.7*  PLT 261  --   --  278  LABPROT 28.7*  --   --  25.6*  INR 2.7*  --   --  2.3*  CREATININE 1.14 1.20  --  1.18  TROPONINIHS 26*  --  27*  --     Estimated Creatinine Clearance: 90.2 mL/min (by C-G formula based on SCr of 1.18 mg/dL).   Medical History: Past Medical History:  Diagnosis Date   Anxiety    History of artificial heart valve    a. s/p aortic valvuloplasty at age 49 b. subsequent mechanical AVR at 43 yo c. 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   HTN (hypertension) 04/05/2023    Medications:  Medications Prior to Admission  Medication Sig Dispense Refill Last Dose/Taking   acetaminophen (TYLENOL) 500 MG tablet Take 1,000 mg by mouth every 6 (six) hours as needed for mild pain (pain score 1-3).   06/16/2023   ALPRAZolam (XANAX) 1 MG tablet TAKE 1 TABLET(1 MG) BY MOUTH THREE TIMES DAILY AS NEEDED FOR ANXIETY (Patient taking differently: Take 1 mg by mouth every 8 (eight) hours as needed for anxiety. TAKE 1 TABLET(1 MG) BY MOUTH THREE TIMES DAILY AS NEEDED FOR ANXIETY) 90 tablet 0 06/18/2023 Noon   amiodarone (PACERONE) 200 MG tablet Take 1 tablet (200 mg total) by mouth 2 (two) times daily for 14 days, THEN 1 tablet (200 mg total) daily. 88 tablet 0 06/18/2023 Morning   aspirin EC 81 MG tablet Take 1 tablet (81 mg total) by mouth daily. Swallow whole. 30 tablet 12 06/18/2023 at  5:45 AM   escitalopram (LEXAPRO) 10 MG  tablet TAKE 1 TABLET(10 MG) BY MOUTH DAILY (Patient taking differently: Take 10 mg by mouth at bedtime. TAKE 1 TABLET(10 MG) BY MOUTH DAILY) 90 tablet 0 06/17/2023 Bedtime   lisinopril (ZESTRIL) 20 MG tablet Take 1 tablet (20 mg total) by mouth daily. 90 tablet 3 06/18/2023 Morning   metoprolol tartrate (LOPRESSOR) 25 MG tablet Take 1 tablet (25 mg total) by mouth 2 (two) times daily. 30 tablet 5 06/18/2023 Morning   omeprazole (PRILOSEC) 40 MG capsule Take 1 capsule (40 mg total) by mouth daily. 60 capsule 0 06/17/2023 Morning   oxycodone (OXY-IR) 5 MG capsule Take 1 capsule (5 mg total) by mouth every 6 (six) hours as needed for pain. (Patient taking differently: Take 5 mg by mouth every 4 (four) hours as needed for pain.) 30 capsule 0 06/18/2023 at  1:00 PM   warfarin (COUMADIN) 5 MG tablet Take 1 tablet (5 mg total) by mouth daily. 30 tablet 5 06/17/2023 at 11:00 PM    Assessment: Pharmacy consulted to dose warfarin in patient with atrial fibrillation and aortic valve replacement.  Patient's INR on admission is therapeutic at 2.7 with last dose unknown at this time.  Home regimen listed as 5 mg every Sun and 2.5 mg ROW.  Hgb 8.9 INR 2.7 > 2.3  Patient  is on amiodarone prior to admission since February 2025  Goal of Therapy:  INR 2-3 Monitor platelets by anticoagulation protocol: Yes   Plan:  Warfarin 2.5 mg x 1 dose. Monitor daily INR and s/s of bleeding.  Judeth Cornfield, PharmD Clinical Pharmacist 06/19/2023 7:59 AM

## 2023-06-19 NOTE — Discharge Instructions (Signed)
 IMPORTANT INFORMATION: PAY CLOSE ATTENTION   PHYSICIAN DISCHARGE INSTRUCTIONS  Follow with Primary care provider  [facility]  and other consultants as instructed by your Hospitalist Physician  SEEK MEDICAL CARE OR RETURN TO EMERGENCY ROOM IF SYMPTOMS COME BACK, WORSEN OR NEW PROBLEM DEVELOPS   Please note: You were cared for by a hospitalist during your hospital stay. Every effort will be made to forward records to your primary care provider.  You can request that your primary care provider send for your hospital records if they have not received them.  Once you are discharged, your primary care physician will handle any further medical issues. Please note that NO REFILLS for any discharge medications will be authorized once you are discharged, as it is imperative that you return to your primary care physician (or establish a relationship with a primary care physician if you do not have one) for your post hospital discharge needs so that they can reassess your need for medications and monitor your lab values.  Please get a complete blood count and chemistry panel checked by your Primary MD at your next visit, and again as instructed by your Primary MD.  Get Medicines reviewed and adjusted: Please take all your medications with you for your next visit with your Primary MD  Laboratory/radiological data: Please request your Primary MD to go over all hospital tests and procedure/radiological results at the follow up, please ask your primary care provider to get all Hospital records sent to his/her office.  In some cases, they will be blood work, cultures and biopsy results pending at the time of your discharge. Please request that your primary care provider follow up on these results.  If you are diabetic, please bring your blood sugar readings with you to your follow up appointment with primary care.    Please call and make your follow up appointments as soon as possible.     Also Note the following: If you experience worsening of your admission symptoms, develop shortness of breath, life threatening emergency, suicidal or homicidal thoughts you must seek medical attention immediately by calling 911 or calling your MD immediately  if symptoms less severe.  You must read complete instructions/literature along with all the possible adverse reactions/side effects for all the Medicines you take and that have been prescribed to you. Take any new Medicines after you have completely understood and accpet all the possible adverse reactions/side effects.   Do not drive when taking Pain medications or sleeping medications (Benzodiazepines)  Do not take more than prescribed Pain, Sleep and Anxiety Medications. It is not advisable to combine anxiety,sleep and pain medications without talking with your primary care practitioner  Special Instructions: If you have smoked or chewed Tobacco  in the last 2 yrs please stop smoking, stop any regular Alcohol  and or any Recreational drug use.  Wear Seat belts while driving.  Do not drive if taking any narcotic, mind altering or controlled substances or recreational drugs or alcohol.

## 2023-06-19 NOTE — TOC Transition Note (Signed)
 Transition of Care [facility]) - Discharge Note   Patient Details  Name: Chase Lee MRN: [id number] Date of Birth: 04-20-1980  [facility] of [facility]) CM/SW Contact:  Leitha Bleak, RN Phone Number: 06/19/2023, 10:38 AM   Clinical Narrative:     Transition of Care [facility]) - Inpatient Brief Assessment   Patient Details  Name: Chase Lee MRN: [id number] Date of Birth: [birth year]  Transition of Care [facility]) CM/SW Contact:    Leitha Bleak, RN Phone Number: 06/19/2023, 10:39 AM   Clinical Narrative:   Patient discharging home, MD discussed and added CHF education to AVS. Patient does not have insurance that will cover CHF education. Patient discharging home with no other needs.   Transition of Care Asessment: Insurance and Status: Insurance coverage has been reviewed Patient has primary care physician: Yes Home environment has been reviewed: Home with family Prior level of function:: independent Prior/Current Home Services: No current home services Social Drivers of Health Review: SDOH reviewed no interventions necessary Readmission risk has been reviewed: Yes Transition of care needs: no transition of care needs at this time   Final next level of care: Home w Home Health Services Barriers to Discharge: Barriers Resolved   Patient Goals and CMS Choice Patient states their goals for this hospitalization and ongoing recovery are:: to return home CMS Medicare.gov Compare Post Acute Care list provided to:: Patient Choice offered to / list presented to : Patient      Discharge Placement              Home    Patient and family notified of of transfer: 06/19/23  Discharge Plan and Services Additional resources added to the After Visit Summary for         Social Drivers of Health (SDOH) Interventions SDOH Screenings   Food Insecurity: No Food Insecurity (06/19/2023)  Housing: Low Risk  (06/19/2023)  Transportation Needs: No Transportation Needs (06/19/2023)   Utilities: Not At Risk (06/18/2023)  Depression (PHQ2-9): Medium Risk (06/06/2023)  Social Connections: Socially Integrated (04/20/2023)  Tobacco Use: Medium Risk (06/19/2023)     Readmission Risk Interventions    06/13/2021   11:06 AM  Readmission Risk Prevention Plan  Medication Screening Complete  Transportation Screening Complete

## 2023-06-19 NOTE — Progress Notes (Signed)
 Heart Failure Nurse Navigator Progress Note  PCP: [facility] PCP-Cardiologist: Dr. Marjo Bicker, MD. Admission Diagnosis: Acute Congestive Heart Failure, unspecified heart failure type [facility]) Admitted from: Home  Patient interview and HF education conducted remotely via telephone to patient room [facility].  Two patient identifiers-Name and DOB confirmed prior to interview.  Presentation:   Chase Lee presented with upper chest pain feels like "tightness" and shortness of breath with no cough or fever.  Recent lifting with a mattress and right-sided chest pain occurred. Patient also status post ascending aortic aneurysm repair and mechanical valve replacement on 04/26/23. BNP 616.0.  Chest X-Ray-some pleural effusions and pulmonary edema.  ECHO/ LVEF: 60-65%- 06/18/23  Clinical Course:  Past Medical History:  Diagnosis Date   Anxiety    History of artificial heart valve    a. s/p aortic valvuloplasty at age 45 b. subsequent mechanical AVR at 43 yo c. 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   HTN (hypertension) 04/05/2023     Social History   Socioeconomic History   Marital status: Married    Spouse name: Not on file   Number of children: 0   Years of education: Not on file   Highest education level: Some college, no degree  Occupational History   Occupation: Airline pilot    Comment: Works for [employer]  Tobacco Use   Smoking status: Former    Current packs/day: 0.50    Average packs/day: 0.5 packs/day for 0.2 years (0.1 ttl pk-yrs)    Types: Cigarettes    Start date: 04/06/2023    Quit date: 08/10/2019    Passive exposure: Never   Smokeless tobacco: Never   Tobacco comments:    Tries to chew nicotine gum at work - smoking 3 ciggs per day   Vaping Use   Vaping status: Never Used  Substance and Sexual Activity   Alcohol use: Never   Drug use: Never   Sexual  activity: Yes    Birth control/protection: None  Other Topics Concern   Not on file  Social History Narrative   Not on file   Social Drivers of Health   Financial Resource Strain: Medium Risk (06/19/2023)   Overall Financial Resource Strain (CARDIA)    Difficulty of Paying Living Expenses: Somewhat hard  Food Insecurity: No Food Insecurity (06/19/2023)   Hunger Vital Sign    Worried About Running Out of Food in the Last Year: Never true    Ran Out of Food in the Last Year: Never true  Transportation Needs: No Transportation Needs (06/19/2023)   PRAPARE - Administrator, Civil Service (Medical): No    Lack of Transportation (Non-Medical): No  Physical Activity: Not on file  Stress: Not on file  Social Connections: Socially Integrated (04/20/2023)   Social Connection and Isolation Panel [NHANES]    Frequency of Communication with Friends and Family: More than three times a week    Frequency of Social Gatherings with Friends and Family: More than three times a week    Attends Religious Services: 1 to 4 times per year    Active Member of [employer] or Organizations: Yes    Attends Banker Meetings: 1 to 4 times per year    Marital Status: Married   Water engineer and Provision:  Detailed education and instructions provided on heart failure disease management including the following:  Signs and symptoms of Heart Failure  When to call the physician Importance of daily weights Low sodium diet Fluid restriction Medication management Anticipated future follow-up appointments  Patient education given on each of the above topics.  Patient acknowledges understanding via teach back method and acceptance of all instructions.  Education Materials:  "Living Better With Heart Failure" Booklet, HF zone tool, & Daily Weight Tracker Tool.  Patient has scale at home: No.  He is currently [facility] on M,W,F when he is there.  He will get one for home now  though. Patient has pill box at home: Yes    High Risk Criteria for Readmission and/or Poor Patient Outcomes: Heart failure hospital admissions (last 6 months): 1  No Show rate: 3 Difficult social situation: None Demonstrates medication adherence: Per patient-had stopped taking his Furosemide because he didn't think he needed it. Primary Language: English Literacy level: Reading, Writing & Comprehension  Barriers of Care:   Daily Weights Diet & Fluid Restrictions Medication Adherence  Considerations/Referrals:   Referral made to Heart Failure Pharmacist Stewardship: Yes Referral made to Heart Failure CSW/NCM TOC: Yes Referral made to Heart & Vascular TOC clinic: Yes-06/26/23 @ 11:30  Items for Follow-up on DC/TOC: Daily Weights Diet & Fluid Restrictions Medication Adherence Continued Heart Failure Education  Chase Horseman, RN, BSN [facility] Heart Failure Navigator Secure Chat Only

## 2023-06-20 ENCOUNTER — Telehealth: Payer: Self-pay

## 2023-06-20 ENCOUNTER — Other Ambulatory Visit: Payer: Self-pay | Admitting: *Deleted

## 2023-06-20 ENCOUNTER — Encounter (HOSPITAL_COMMUNITY): Admission: RE | Admit: 2023-06-20 | Source: Ambulatory Visit

## 2023-06-20 DIAGNOSIS — D509 Iron deficiency anemia, unspecified: Secondary | ICD-10-CM

## 2023-06-20 NOTE — Telephone Encounter (Signed)
 ASA 3/4

## 2023-06-20 NOTE — Telephone Encounter (Signed)
 Pt ASA 3? Please advise. Thank you

## 2023-06-20 NOTE — Telephone Encounter (Signed)
 Left message that appointment has been changed to 06/25/2023 at 8:40. Also sent MyChart message as well

## 2023-06-20 NOTE — Telephone Encounter (Signed)
Spoke to pt, informed him of recommendations. Pt voiced understanding. Labs entered into [employer]

## 2023-06-21 ENCOUNTER — Telehealth (INDEPENDENT_AMBULATORY_CARE_PROVIDER_SITE_OTHER): Payer: Self-pay | Admitting: Gastroenterology

## 2023-06-21 MED ORDER — PEG 3350-KCL-NA BICARB-NACL 420 G PO SOLR: 4000.0000 mL | Freq: Once | ORAL | 0 refills | Status: AC

## 2023-06-21 NOTE — Telephone Encounter (Signed)
    06/21/23  Chase Lee 24-Apr-1980  What type of surgery is being performed? EGD/Colonoscopy  When is surgery scheduled? 07/10/23  What type of clearance is required (medical or pharmacy to hold medication or both? Pharmacy to hold medication  Are there any medications that need to be held prior to surgery and how long? Clearance to hold Warfarin for 5 days  Name of physician performing surgery?  Dr. Katrinka Blazing [facility] Phone: [phone] Fax: [phone]  Anethesia type (none, local, MAC, general)? Choice

## 2023-06-21 NOTE — Telephone Encounter (Signed)
 Pt contacted and scheduled. Instructions sent via my chart. Prep sent to pharmacy. Contacted [employer] no PA needed for EGD or TCS .

## 2023-06-21 NOTE — Addendum Note (Signed)
 Addended by: Marlowe Shores on: 06/21/2023 02:35 PM   Modules accepted: Orders

## 2023-06-22 ENCOUNTER — Encounter (HOSPITAL_COMMUNITY)

## 2023-06-22 DIAGNOSIS — Z7901 Long term (current) use of anticoagulants: Secondary | ICD-10-CM

## 2023-06-22 NOTE — Telephone Encounter (Signed)
   Name: Chase Lee  DOB: Nov 02, 1980  MRN: [id number]  Primary Cardiologist: Marjo Bicker, MD  Chart reviewed as part of pre-operative protocol coverage. Because of Jacksyn Beeks Mctighe's past medical history and time since last visit, he will require a follow-up in-office visit in order to better assess preoperative cardiovascular risk.  Patient has an office visit scheduled on 06/25/2023 with Dr. Jenene Slicker. Appointment notes have been updated to reflect need for pre-op evaluation.   Pre-op covering staff:  - Please contact requesting surgeon's office via preferred method (i.e, phone, fax) to inform them of need for appointment prior to surgery.  This message will also be routed to pharmacy pool for input on holding Coumadin as requested below so that this information is available to the clearing provider at time of patient's appointment.   Carlos Levering, NP  06/22/2023, 8:30 AM

## 2023-06-22 NOTE — Telephone Encounter (Signed)
 Patient with diagnosis of A fib with On-X mechanical AVR on warfarin for anticoagulation.    Procedure: EGD/Colonoscopy  Date of procedure: 07/10/23   CHA2DS2-VASc Score = 2  This indicates a 2.2% annual risk of stroke. The patient's score is based upon: CHF History: 0 HTN History: 1 Diabetes History: 0 Stroke History: 0 Vascular Disease History: 1 Age Score: 0 Gender Score: 0   CrCl 116 ml/min Platelet count 278K   Per office protocol, patient can hold warfarin  for 5 days prior to procedure.    Patient WILL need bridging with Lovenox (enoxaparin) around procedure. This will be organized by Coumadin clinic  **This guidance is not considered finalized until pre-operative APP has relayed final recommendations.**

## 2023-06-25 ENCOUNTER — Encounter (HOSPITAL_COMMUNITY): Admission: RE | Admit: 2023-06-25 | Source: Ambulatory Visit

## 2023-06-25 ENCOUNTER — Ambulatory Visit: Payer: Self-pay | Attending: Internal Medicine | Admitting: Internal Medicine

## 2023-06-25 ENCOUNTER — Encounter: Payer: Self-pay | Admitting: Internal Medicine

## 2023-06-25 VITALS — BP 118/62 | HR 57 | Ht 67.0 in | Wt 226.0 lb

## 2023-06-25 DIAGNOSIS — I5032 Chronic diastolic (congestive) heart failure: Secondary | ICD-10-CM

## 2023-06-25 DIAGNOSIS — Z95828 Presence of other vascular implants and grafts: Secondary | ICD-10-CM | POA: Diagnosis not present
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
## 2023-06-25 NOTE — Patient Instructions (Addendum)
 Medication Instructions:  Only take Amiodarone 200 mg once daily Continue taking all other medications as prescribed  Labwork: None  Testing/Procedures:   Follow-Up: Your physician recommends that you schedule a follow-up appointment in: 6 months  Any Other Special Instructions Will Be Listed Below (If Applicable). Thank you for choosing [facility]!     If you need a refill on your cardiac medications before your next appointment, please call your pharmacy.

## 2023-06-26 ENCOUNTER — Encounter: Admitting: Family

## 2023-06-26 DIAGNOSIS — I5032 Chronic diastolic (congestive) heart failure: Secondary | ICD-10-CM | POA: Insufficient documentation

## 2023-06-26 DIAGNOSIS — I5033 Acute on chronic diastolic (congestive) heart failure: Secondary | ICD-10-CM | POA: Insufficient documentation

## 2023-06-26 DIAGNOSIS — Z95828 Presence of other vascular implants and grafts: Secondary | ICD-10-CM | POA: Insufficient documentation

## 2023-06-27 ENCOUNTER — Ambulatory Visit (HOSPITAL_COMMUNITY)
Admission: RE | Admit: 2023-06-27 | Discharge: 2023-06-27 | Disposition: A | Discharge status: Home / Self Care | Source: Ambulatory Visit | Attending: Internal Medicine | Admitting: Internal Medicine

## 2023-06-27 ENCOUNTER — Encounter (HOSPITAL_COMMUNITY): Admission: RE | Admit: 2023-06-27 | Source: Ambulatory Visit

## 2023-06-27 ENCOUNTER — Encounter (HOSPITAL_COMMUNITY): Payer: Self-pay

## 2023-06-27 DIAGNOSIS — Z952 Presence of prosthetic heart valve: Secondary | ICD-10-CM

## 2023-06-29 ENCOUNTER — Encounter (HOSPITAL_COMMUNITY)

## 2023-07-02 ENCOUNTER — Encounter (HOSPITAL_COMMUNITY)

## 2023-07-03 NOTE — Patient Instructions (Signed)
 Chase Lee  07/03/2023     @PREFPERIOPPHARMACY @   Your procedure is scheduled on 07/10/2023.   Report to [facility] at 11:30 A.M.   Call this number if you have problems the morning of surgery:  [phone]  If you experience any cold or flu symptoms such as cough, fever, chills, shortness of breath, etc. between now and your scheduled surgery, please notify us at the above number.   Remember:   Please follow the diet and prep instructions given to you by Dr Wilburt Finlay office.   You may drink clear liquids until 9:30 AM .  Clear liquids allowed are:                    Water, Juice (No red color; non-citric and without pulp; diabetics please choose diet or no sugar options), Carbonated beverages (diabetics please choose diet or no sugar options), Clear Tea (No creamer, milk, or cream, including half & half and powdered creamer), Black Coffee Only (No creamer, milk or cream, including half & half and powdered creamer), Plain Jell-O Only (No red color; diabetics please choose no sugar options), Clear Sports drink (No red color; diabetics please choose diet or no sugar options), and Plain Popsicles Only (No red color; diabetics please choose no sugar options)    Take these medicines the morning of surgery with A SIP OF WATER : Xanax Pacerone Lexapro Metoprolol and Prilosec    Do not wear jewelry, make-up or nail polish, including gel polish,  artificial nails, or any other type of covering on natural nails (fingers and  toes).  Do not wear lotions, powders, or perfumes, or deodorant.  Do not shave 48 hours prior to surgery.  Men may shave face and neck.  Do not bring valuables to the hospital.  [facility] is not responsible for any belongings or valuables.  Contacts, dentures or bridgework may not be worn into surgery.  Leave your suitcase in the car.  After surgery it may be brought to your room.  For patients admitted to the hospital, discharge time will be determined by  your treatment team.  Patients discharged the day of surgery will not be allowed to drive home.   Name and phone number of your driver:   Family  Special instructions:  N/A  Please read over the following fact sheets that you were given. Care and Recovery After Surgery  Upper Endoscopy, Adult Upper endoscopy is a procedure to look inside the upper GI (gastrointestinal) tract. The upper GI tract is made up of: The esophagus. This is the part of the body that moves food from your mouth to your stomach. The stomach. The duodenum. This is the first part of your small intestine. This procedure is also called esophagogastroduodenoscopy (EGD) or gastroscopy. In this procedure, your health care provider passes a thin, flexible tube (endoscope) through your mouth and down your esophagus into your stomach and into your duodenum. A small camera is attached to the end of the tube. Images from the camera appear on a monitor in the exam room. During this procedure, your health care provider may also remove a small piece of tissue to be sent to a lab and examined under a microscope (biopsy). Your health care provider may do an upper endoscopy to diagnose cancers of the upper GI tract. You may also have this procedure to find the cause of other conditions, such as: Stomach pain. Heartburn. Pain or problems when swallowing. Nausea and vomiting. Stomach  bleeding. Stomach ulcers. Tell a health care provider about: Any allergies you have. All medicines you are taking, including vitamins, herbs, eye drops, creams, and over-the-counter medicines. Any problems you or family members have had with anesthetic medicines. Any bleeding problems you have. Any surgeries you have had. Any medical conditions you have. Whether you are pregnant or may be pregnant. What are the risks? Your healthcare provider will talk with you about risks. These may include: Infection. Bleeding. Allergic reactions to medicines. A  tear or hole (perforation) in the esophagus, stomach, or duodenum. What happens before the procedure? When to stop eating and drinking Follow instructions from your health care provider about what you may eat and drink. These may include: 8 hours before your procedure Stop eating most foods. Do not eat meat, fried foods, or fatty foods. Eat only light foods, such as toast or crackers. All liquids are okay except energy drinks and alcohol. 6 hours before your procedure Stop eating. Drink only clear liquids, such as water, clear fruit juice, black coffee, plain tea, and sports drinks. Do not drink energy drinks or alcohol. 2 hours before your procedure Stop drinking all liquids. You may be allowed to take medicines with small sips of water. If you do not follow your health care provider's instructions, your procedure may be delayed or canceled. Medicines Ask your health care provider about: Changing or stopping your regular medicines. This is especially important if you are taking diabetes medicines or blood thinners. Taking medicines such as aspirin and ibuprofen. These medicines can thin your blood. Do not take these medicines unless your health care provider tells you to take them. Taking over-the-counter medicines, vitamins, herbs, and supplements. General instructions If you will be going home right after the procedure, plan to have a responsible adult: Take you home from the hospital or clinic. You will not be allowed to drive. Care for you for the time you are told. What happens during the procedure?  An IV will be inserted into one of your veins. You may be given one or more of the following: A medicine to help you relax (sedative). A medicine to numb the throat (local anesthetic). You will lie on your left side on an exam table. Your health care provider will pass the endoscope through your mouth and down your esophagus. Your health care provider will use the scope to check  the inside of your esophagus, stomach, and duodenum. Biopsies may be taken. The endoscope will be removed. The procedure may vary among health care providers and hospitals. What happens after the procedure? Your blood pressure, heart rate, breathing rate, and blood oxygen level will be monitored until you leave the hospital or clinic. When your throat is no longer numb, you may be given some fluids to drink. If you were given a sedative during the procedure, it can affect you for several hours. Do not drive or operate machinery until your health care provider says that it is safe. It is up to you to get the results of your procedure. Ask your health care provider, or the department that is doing the procedure, when your results will be ready. Contact a health care provider if you: Have a sore throat that lasts longer than 1 day. Have a fever. Get help right away if you: Vomit blood or your vomit looks like coffee grounds. Have bloody, black, or tarry stools. Have a very bad sore throat or you cannot swallow. Have difficulty breathing or very bad pain in your  chest or abdomen. These symptoms may be an emergency. Get help right away. Call 911. Do not wait to see if the symptoms will go away. Do not drive yourself to the hospital. Summary Upper endoscopy is a procedure to look inside the upper GI tract. During the procedure, an IV will be inserted into one of your veins. You may be given a medicine to help you relax. The endoscope will be passed through your mouth and down your esophagus. Follow instructions from your health care provider about what you can eat and drink. This information is not intended to replace advice given to you by your health care provider. Make sure you discuss any questions you have with your health care provider. Document Revised: 06/15/2021 Document Reviewed: 06/15/2021 Elsevier Patient Education  2024 [employer]   Colonoscopy, Adult A colonoscopy is a  procedure to look at the entire large intestine. This procedure is done using a long, thin, flexible tube that has a camera on the end. You may have a colonoscopy: As a part of normal colorectal screening. If you have certain symptoms, such as: A low number of red blood cells in your blood (anemia). Diarrhea that does not go away. Pain in your abdomen. Blood in your stool. A colonoscopy can help screen for and diagnose medical problems, including: An abnormal growth of cells or tissue (tumor). Abnormal growths within the lining of your intestine (polyps). Inflammation. Areas of bleeding. Tell your health care provider about: Any allergies you have. All medicines you are taking, including vitamins, herbs, eye drops, creams, and over-the-counter medicines. Any problems you or family members have had with anesthetic medicines. Any bleeding problems you have. Any surgeries you have had. Any medical conditions you have. Any problems you have had with having bowel movements. Whether you are pregnant or may be pregnant. What are the risks? Generally, this is a safe procedure. However, problems may occur, including: Bleeding. Damage to your intestine. Allergic reactions to medicines given during the procedure. Infection. This is rare. What happens before the procedure? Eating and drinking restrictions Follow instructions from your health care provider about eating or drinking restrictions, which may include: A few days before the procedure: Follow a low-fiber diet. Avoid nuts, seeds, dried fruit, raw fruits, and vegetables. 1-3 days before the procedure: Eat only gelatin dessert or ice pops. Drink only clear liquids, such as water, clear juice, clear broth or bouillon, black coffee or tea, or clear soft drinks or sports drinks. Avoid liquids that contain red or purple dye. The day of the procedure: Do not eat solid foods. You may continue to drink clear liquids until up to 2 hours  before the procedure. Do not eat or drink anything starting 2 hours before the procedure, or within the time period that your health care provider recommends. Bowel prep If you were prescribed a bowel prep to take by mouth (orally) to clean out your colon: Take it as told by your health care provider. Starting the day before your procedure, you will need to drink a large amount of liquid medicine. The liquid will cause you to have many bowel movements of loose stool until your stool becomes almost clear or light [place]. If your skin or the opening between the buttocks (anus) gets irritated from diarrhea, you may relieve the irritation using: Wipes with medicine in them, such as adult wet wipes with aloe and vitamin E. A product to soothe skin, such as petroleum jelly. If you vomit while drinking the bowel  prep: Take a break for up to 60 minutes. Begin the bowel prep again. Call your health care provider if you keep vomiting or you cannot take the bowel prep without vomiting. To clean out your colon, you may also be given: Laxative medicines. These help you have a bowel movement. Instructions for enema use. An enema is liquid medicine injected into your rectum. Medicines Ask your health care provider about: Changing or stopping your regular medicines or supplements. This is especially important if you are taking iron supplements, diabetes medicines, or blood thinners. Taking medicines such as aspirin and ibuprofen. These medicines can thin your blood. Do not take these medicines unless your health care provider tells you to take them. Taking over-the-counter medicines, vitamins, herbs, and supplements. General instructions Ask your health care provider what steps will be taken to help prevent infection. These may include washing skin with a germ-killing soap. If you will be going home right after the procedure, plan to have a responsible adult: Take you home from the hospital or clinic. You will  not be allowed to drive. Care for you for the time you are told. What happens during the procedure?  An IV will be inserted into one of your veins. You will be given a medicine to make you fall asleep (general anesthetic). You will lie on your side with your knees bent. A lubricant will be put on the tube. Then the tube will be: Inserted into your anus. Gently eased through all parts of your large intestine. Air will be sent into your colon to keep it open. This may cause some pressure or cramping. Images will be taken with the camera and will appear on a screen. A small tissue sample may be removed to be looked at under a microscope (biopsy). The tissue may be sent to a lab for testing if any signs of problems are found. If small polyps are found, they may be removed and checked for cancer cells. When the procedure is finished, the tube will be removed. The procedure may vary among health care providers and hospitals. What happens after the procedure? Your blood pressure, heart rate, breathing rate, and blood oxygen level will be monitored until you leave the hospital or clinic. You may have a small amount of blood in your stool. You may pass gas and have mild cramping or bloating in your abdomen. This is caused by the air that was used to open your colon during the exam. If you were given a sedative during the procedure, it can affect you for several hours. Do not drive or operate machinery until your health care provider says that it is safe. It is up to you to get the results of your procedure. Ask your health care provider, or the department that is doing the procedure, when your results will be ready. Summary A colonoscopy is a procedure to look at the entire large intestine. Follow instructions from your health care provider about eating and drinking before the procedure. If you were prescribed an oral bowel prep to clean out your colon, take it as told by your health care  provider. During the colonoscopy, a flexible tube with a camera on its end is inserted into the anus and then passed into all parts of the large intestine. This information is not intended to replace advice given to you by your health care provider. Make sure you discuss any questions you have with your health care provider. Document Revised: 04/18/2022 Document Reviewed: 10/27/2020 Elsevier Patient  Education  2024 [employer] Inc[employer]  Monitored Anesthesia Care Anesthesia refers to the techniques, procedures, and medicines that help a person stay safe and comfortable during surgery. Monitored anesthesia care, or sedation, is one type of anesthesia. You may have sedation if you do not need to be asleep for your procedure. Procedures that use sedation may include: Surgery to remove cataracts from your eyes. A dental procedure. A biopsy. This is when a tissue sample is removed and looked at under a microscope. You will be watched closely during your procedure. Your level of sedation or type of anesthesia may be changed to fit your needs. Tell a health care provider about: Any allergies you have. All medicines you are taking, including vitamins, herbs, eye drops, creams, and over-the-counter medicines. Any problems you or family members have had with anesthesia. Any bleeding problems you have. Any surgeries you have had. Any medical conditions or illnesses you have. This includes sleep apnea, cough, fever, or the flu. Whether you are pregnant or may be pregnant. Whether you use cigarettes, alcohol, or drugs. Any use of steroids, whether by mouth or as a cream. What are the risks? Your health care provider will talk with you about risks. These may include: Getting too much medicine (oversedation). Nausea. Allergic reactions to medicines. Trouble breathing. If this happens, a breathing tube may be used to help you breathe. It will be removed when you are awake and breathing on your own. Heart  trouble. Lung trouble. Confusion that gets better with time (emergence delirium). What happens before the procedure? When to stop eating and drinking Follow instructions from your health care provider about what you may eat and drink. These may include: 8 hours before your procedure Stop eating most foods. Do not eat meat, fried foods, or fatty foods. Eat only light foods, such as toast or crackers. All liquids are okay except energy drinks and alcohol. 6 hours before your procedure Stop eating. Drink only clear liquids, such as water, clear fruit juice, black coffee, plain tea, and sports drinks. Do not drink energy drinks or alcohol. 2 hours before your procedure Stop drinking all liquids. You may be allowed to take medicines with small sips of water. If you do not follow your health care provider's instructions, your procedure may be delayed or canceled. Medicines Ask your health care provider about: Changing or stopping your regular medicines. These include any diabetes medicines or blood thinners you take. Taking medicines such as aspirin and ibuprofen. These medicines can thin your blood. Do not take them unless your health care provider tells you to. Taking over-the-counter medicines, vitamins, herbs, and supplements. Testing You may have an exam or testing. You may have a blood or urine sample taken. General instructions Do not use any products that contain nicotine or tobacco for at least 4 weeks before the procedure. These products include cigarettes, chewing tobacco, and vaping devices, such as e-cigarettes. If you need help quitting, ask your health care provider. If you will be going home right after the procedure, plan to have a responsible adult: Take you home from the hospital or clinic. You will not be allowed to drive. Care for you for the time you are told. What happens during the procedure?  Your blood pressure, heart rate, breathing, level of pain, and blood  oxygen level will be monitored. An IV will be inserted into one of your veins. You may be given: A sedative. This helps you relax. Anesthesia. This will: Numb certain areas of your body.  Make you fall asleep for surgery. You will be given medicines as needed to keep you comfortable. The more medicine you are given, the deeper your level of sedation will be. Your level of sedation may be changed to fit your needs. There are three levels of sedation: Mild sedation. At this level, you may feel awake and relaxed. You will be able to follow directions. Moderate sedation. At this level, you will be sleepy. You may not remember the procedure. Deep sedation. At this level, you will be asleep. You will not remember the procedure. How you get the medicines will depend on your age and the procedure. They may be given as: A pill. This may be taken by mouth (orally) or inserted into the rectum. An injection. This may be into a vein or muscle. A spray through the nose. After your procedure is over, the medicine will be stopped. The procedure may vary among health care providers and hospitals. What happens after the procedure? Your blood pressure, heart rate, breathing rate, and blood oxygen level will be monitored until you leave the hospital or clinic. You may feel sleepy, clumsy, or nauseous. You may not remember what happened during or after the procedure. Sedation can affect you for several hours. Do not drive or use machinery until your health care provider says that it is safe. This information is not intended to replace advice given to you by your health care provider. Make sure you discuss any questions you have with your health care provider. Document Revised: 08/01/2021 Document Reviewed: 08/01/2021 Elsevier Patient Education  2024 [employer].

## 2023-07-04 ENCOUNTER — Encounter

## 2023-07-04 ENCOUNTER — Telehealth: Payer: Self-pay | Admitting: Adult Health

## 2023-07-04 ENCOUNTER — Encounter (HOSPITAL_COMMUNITY)

## 2023-07-04 NOTE — Telephone Encounter (Signed)
 LF 3/22, due 4/19, no early RF

## 2023-07-04 NOTE — Telephone Encounter (Signed)
 Next visit is 08/08/23. Requesting refill on Alprazolam called to:  [facility], [address], [place], [place] [address] [phone]

## 2023-07-05 ENCOUNTER — Telehealth (INDEPENDENT_AMBULATORY_CARE_PROVIDER_SITE_OTHER): Payer: Self-pay | Admitting: Gastroenterology

## 2023-07-05 ENCOUNTER — Encounter (HOSPITAL_COMMUNITY): Attending: Internal Medicine | Admitting: *Deleted

## 2023-07-05 ENCOUNTER — Other Ambulatory Visit: Payer: Self-pay

## 2023-07-05 ENCOUNTER — Encounter

## 2023-07-05 ENCOUNTER — Encounter (HOSPITAL_COMMUNITY)
Admission: RE | Admit: 2023-07-05 | Discharge: 2023-07-05 | Disposition: A | Discharge status: Home / Self Care | Source: Ambulatory Visit | Attending: Gastroenterology | Admitting: Gastroenterology

## 2023-07-05 DIAGNOSIS — Z5181 Encounter for therapeutic drug level monitoring: Secondary | ICD-10-CM

## 2023-07-05 DIAGNOSIS — Z01810 Encounter for preprocedural cardiovascular examination: Secondary | ICD-10-CM | POA: Insufficient documentation

## 2023-07-05 DIAGNOSIS — F410 Panic disorder [episodic paroxysmal anxiety] without agoraphobia: Secondary | ICD-10-CM

## 2023-07-05 DIAGNOSIS — F411 Generalized anxiety disorder: Secondary | ICD-10-CM

## 2023-07-05 DIAGNOSIS — I480 Paroxysmal atrial fibrillation: Secondary | ICD-10-CM

## 2023-07-05 DIAGNOSIS — Z952 Presence of prosthetic heart valve: Secondary | ICD-10-CM | POA: Insufficient documentation

## 2023-07-05 LAB — POCT INR: INR: 1.8 — AB (ref 2.0–3.0)

## 2023-07-05 MED ORDER — ALPRAZOLAM 1 MG PO TABS: ORAL_TABLET | ORAL | 0 refills | Status: DC

## 2023-07-05 NOTE — Patient Instructions (Signed)
 On Amiodarone 200mg  daily x 2 wks Take warfarin 1 tablet tonight then resume 1/2 tablet daily except 1 tablet on Sundays (has only been taking 1/2 tablet daily) Recheck in 2 wks

## 2023-07-05 NOTE — Telephone Encounter (Signed)
 Pended alprazolam for fill 4/19 to [facility] in [place].

## 2023-07-05 NOTE — Telephone Encounter (Signed)
 Pt left voicemail that he thought his TCS/EGD was rescheduled. Returned call to pt and pt states he will be going out of town and then going back to work. Pt rescheduled to 08/15/23. New instructions will be mailed.

## 2023-07-06 ENCOUNTER — Encounter (HOSPITAL_COMMUNITY)

## 2023-07-09 ENCOUNTER — Ambulatory Visit: Payer: No Typology Code available for payment source | Admitting: Internal Medicine

## 2023-07-09 ENCOUNTER — Encounter (HOSPITAL_COMMUNITY)

## 2023-07-09 ENCOUNTER — Ambulatory Visit: Payer: Self-pay | Admitting: Internal Medicine

## 2023-07-11 ENCOUNTER — Encounter (HOSPITAL_COMMUNITY): Payer: Self-pay | Admitting: *Deleted

## 2023-07-11 ENCOUNTER — Encounter (HOSPITAL_COMMUNITY)

## 2023-07-11 DIAGNOSIS — Z952 Presence of prosthetic heart valve: Secondary | ICD-10-CM

## 2023-07-11 NOTE — Progress Notes (Signed)
 Cardiac Individual Treatment Plan  Lee Details  Name: Chase Lee MRN: [id number] Date of Birth: 03/29/1980 Referring Provider:   Flowsheet Row CARDIAC REHAB PHASE II ORIENTATION from 06/06/2023 in [facility] CARDIAC REHABILITATION  Referring Provider Chase Patient MD       Initial Encounter Date:  Flowsheet Row CARDIAC REHAB PHASE II ORIENTATION from 06/06/2023 in [place] CARDIAC REHABILITATION  Date 06/06/23       Visit Diagnosis: S/P AVR (aortic valve replacement)  Lee's Home Medications on Admission:  Current Outpatient Medications:    acetaminophen  (TYLENOL ) 500 MG tablet, Take 1,000 mg by mouth every 6 (six) hours as needed for mild pain (pain score 1-3)., Disp: , Rfl:    ALPRAZolam  (XANAX ) 1 MG tablet, TAKE 1 TABLET(1 MG) BY MOUTH THREE TIMES DAILY AS NEEDED FOR ANXIETY, Disp: 90 tablet, Rfl: 0   amiodarone  (PACERONE ) 200 MG tablet, Take 1 tablet (200 mg total) by mouth daily., Disp: , Rfl:    ascorbic acid (VITAMIN C ) 500 MG tablet, Take 1 tablet (500 mg total) by mouth daily with breakfast., Disp: 30 tablet, Rfl: 1   aspirin  EC 81 MG tablet, Take 1 tablet (81 mg total) by mouth daily. Swallow whole., Disp: 30 tablet, Rfl: 12   escitalopram  (LEXAPRO ) 10 MG tablet, TAKE 1 TABLET(10 MG) BY MOUTH DAILY (Lee taking differently: Take 10 mg by mouth at bedtime. TAKE 1 TABLET(10 MG) BY MOUTH DAILY), Disp: 90 tablet, Rfl: 0   ferrous sulfate  325 (65 FE) MG EC tablet, Take 1 tablet (325 mg total) by mouth daily with breakfast., Disp: 30 tablet, Rfl: 1   furosemide  (LASIX ) 40 MG tablet, Take 1 tablet (40 mg total) by mouth daily., Disp: 30 tablet, Rfl: 1   lisinopril  (ZESTRIL ) 40 MG tablet, Take 1 tablet (40 mg total) by mouth daily., Disp: 90 tablet, Rfl: 0   metoprolol  tartrate (LOPRESSOR ) 25 MG tablet, Take 1 tablet (25 mg total) by mouth 2 (two) times daily., Disp: 30 tablet, Rfl: 5   omeprazole  (PRILOSEC) 40 MG capsule, Take 1 capsule (40 mg total) by  mouth 2 (two) times daily., Disp: 60 capsule, Rfl: 1   rosuvastatin  (CRESTOR ) 10 MG tablet, Take 1 tablet (10 mg total) by mouth daily., Disp: 30 tablet, Rfl: 1   warfarin (COUMADIN ) 5 MG tablet, Take 1 tablet (5 mg total) by mouth daily., Disp: 30 tablet, Rfl: 5  Past Medical History: Past Medical History:  Diagnosis Date   Anxiety    History of artificial heart valve    a. s/p aortic valvuloplasty at age 36 b. subsequent mechanical AVR at 43 yo c. 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   HTN (hypertension) 04/05/2023    Tobacco Use: Social History   Tobacco Use  Smoking Status Former   Current packs/day: 0.50   Average packs/day: 0.5 packs/day for 0.3 years (0.1 ttl pk-yrs)   Types: Cigarettes   Start date: 04/06/2023   Quit date: 08/10/2019   Passive exposure: Never  Smokeless Tobacco Never  Tobacco Comments   Tries to chew nicotine gum at work - smoking 3 ciggs per day     Labs: Review Flowsheet       Latest Ref Rng & Units 04/26/2023 04/27/2023 06/18/2023 06/19/2023  Labs for ITP Cardiac and Pulmonary Rehab  Cholestrol 0 - 200 mg/dL - - - 130   LDL (calc) 0 - 99 mg/dL - - - 865  HDL-C >40 mg/dL - - - 41   Trlycerides <150 mg/dL - - - 93   Hemoglobin U9W 4.8 - 5.6 % - 6.5  - -  PH, Arterial 7.35 - 7.45 7.334  7.330  7.350  7.319  7.349  7.391  7.418  7.275  7.391  7.343  - -  PCO2 arterial 32 - 48 mmHg 33.2  43.7  40.4  48.7  41.8  39.9  34.7  54.9  36.9  39.1  - -  Bicarbonate 20.0 - 28.0 mmol/L 17.7  23.1  22.4  25.4  23.0  24.2  22.4  25.5  22.9  22.4  21.3  - -  TCO2 22 - 32 mmol/L 19  24  24  27  22  24  25  25  25  23  25  27  26  23  24  24  23  24  22  25   -  Acid-base deficit 0.0 - 2.0 mmol/L 7.0  3.0  3.0  1.0  2.0  1.0  2.0  2.0  3.0  2.0  4.0  - -  O2 Saturation % 95  92  91  93  95  100  100  100  71  100  95  - -    Details       Multiple values from one day are sorted in  reverse-chronological order         Capillary Blood Glucose: Lab Results  Component Value Date   GLUCAP 106 (H) 04/29/2023   GLUCAP 129 (H) 04/28/2023   GLUCAP 122 (H) 04/28/2023   GLUCAP 171 (H) 04/28/2023   GLUCAP 168 (H) 04/28/2023     Exercise Target Goals: Exercise Program Goal: Individual exercise prescription set using results from initial 6 min walk test and THRR while considering  Lee's activity barriers and safety.   Exercise Prescription Goal: Starting with aerobic activity 30 plus minutes a day, 3 days per week for initial exercise prescription. Provide home exercise prescription and guidelines that participant acknowledges understanding prior to discharge.  Activity Barriers & Risk Stratification:  Activity Barriers & Cardiac Risk Stratification - 06/04/23 1409       Activity Barriers & Cardiac Risk Stratification   Activity Barriers Incisional Pain;Joint Problems   occassional stiff joints   Cardiac Risk Stratification High             6 Minute Walk:  6 Minute Walk     Row Name 06/06/23 0940         6 Minute Walk   Phase Initial     Distance 910 feet     Walk Time 6 minutes     # of Rest Breaks 0     MPH 1.72     METS 3.01     RPE 12     Perceived Dyspnea  1     VO2 Peak 10.54     Symptoms No     Resting HR 62 bpm     Resting BP 150/80     Resting Oxygen Saturation  97 %     Exercise Oxygen Saturation  during 6 min walk 97 %     Max Ex. HR 80 bpm     Max Ex. BP 146/78     2 Minute Post BP 132/78              Oxygen Initial Assessment:   Oxygen Re-Evaluation:   Oxygen Discharge (Final Oxygen Re-Evaluation):  Initial Exercise Prescription:  Initial Exercise Prescription - 06/06/23 0900       Date of Initial Exercise RX and Referring Provider   Date 06/06/23    Referring Provider Chase Patient MD      Treadmill   MPH 2    Grade 0.5    Minutes 15    METs 2.67      Bike   Level 5    Minutes 15    METs  1.8      Prescription Details   Frequency (times per week) 3    Duration Progress to 30 minutes of continuous aerobic without signs/symptoms of physical distress      Intensity   THRR 40-80% of Max Heartrate 107-152    Ratings of Perceived Exertion 11-13    Perceived Dyspnea 0-4      Resistance Training   Training Prescription Yes    Weight 5    Reps 10-15             Perform Capillary Blood Glucose checks as needed.  Exercise Prescription Changes:   Exercise Comments:   Exercise Comments     Row Name 06/08/23 1138           Exercise Comments First full day of exercise!  Lee was oriented to gym and equipment including functions, settings, policies, and procedures.  Lee's individual exercise prescription and treatment plan were reviewed.  All starting workloads were established based on the results of the 6 minute walk test done at initial orientation visit.  The plan for exercise progression was also introduced and progression will be customized based on Lee's performance and goals.                Exercise Goals and Review:   Exercise Goals     Row Name 06/04/23 1410             Exercise Goals   Increase Physical Activity Yes       Intervention Provide advice, education, support and counseling about physical activity/exercise needs.;Develop an individualized exercise prescription for aerobic and resistive training based on initial evaluation findings, risk stratification, comorbidities and participant's personal goals.       Expected Outcomes Long Term: Exercising regularly at least 3-5 days a week.;Short Term: Attend rehab on a regular basis to increase amount of physical activity.;Long Term: Add in home exercise to make exercise part of routine and to increase amount of physical activity.       Increase Strength and Stamina Yes       Intervention Provide advice, education, support and counseling about physical activity/exercise needs.;Develop  an individualized exercise prescription for aerobic and resistive training based on initial evaluation findings, risk stratification, comorbidities and participant's personal goals.       Expected Outcomes Short Term: Increase workloads from initial exercise prescription for resistance, speed, and METs.;Long Term: Improve cardiorespiratory fitness, muscular endurance and strength as measured by increased METs and functional capacity ( );Short Term: Perform resistance training exercises routinely during rehab and add in resistance training at home       Able to understand and use rate of perceived exertion (RPE) scale Yes       Intervention Provide education and explanation on how to use RPE scale       Expected Outcomes Short Term: Able to use RPE daily in rehab to express subjective intensity level;Long Term:  Able to use RPE to guide intensity level when exercising independently  Able to understand and use Dyspnea scale Yes       Intervention Provide education and explanation on how to use Dyspnea scale       Expected Outcomes Short Term: Able to use Dyspnea scale daily in rehab to express subjective sense of shortness of breath during exertion;Long Term: Able to use Dyspnea scale to guide intensity level when exercising independently       Knowledge and understanding of Target Heart Rate Range (THRR) Yes       Intervention Provide education and explanation of THRR including how the numbers were predicted and where they are located for reference       Expected Outcomes Short Term: Able to state/look up THRR;Long Term: Able to use THRR to govern intensity when exercising independently;Short Term: Able to use daily as guideline for intensity in rehab       Able to check pulse independently Yes       Intervention Provide education and demonstration on how to check pulse in carotid and radial arteries.;Review the importance of being able to check your own pulse for safety during independent  exercise       Expected Outcomes Short Term: Able to explain why pulse checking is important during independent exercise;Long Term: Able to check pulse independently and accurately       Understanding of Exercise Prescription Yes       Intervention Provide education, explanation, and written materials on Lee's individual exercise prescription       Expected Outcomes Short Term: Able to explain program exercise prescription;Long Term: Able to explain home exercise prescription to exercise independently                Exercise Goals Re-Evaluation :    Discharge Exercise Prescription (Final Exercise Prescription Changes):   Nutrition:  Target Goals: Understanding of nutrition guidelines, daily intake of sodium 1500mg , cholesterol 200mg , calories 30% from fat and 7% or less from saturated fats, daily to have 5 or more servings of fruits and vegetables.  Biometrics:  Pre Biometrics - 06/06/23 0944       Pre Biometrics   Height 5' 7.5" (1.715 m)    Weight 230 lb 13.2 oz (104.7 kg)    Waist Circumference 42 inches    Hip Circumference 44 inches    Waist to Hip Ratio 0.95 %    BMI (Calculated) 35.6    Grip Strength 44.5 kg    Single Leg Stand 10.14 seconds              Nutrition Therapy Plan and Nutrition Goals:  Nutrition Therapy & Goals - 06/04/23 1421       Intervention Plan   Intervention Prescribe, educate and counsel regarding individualized specific dietary modifications aiming towards targeted core components such as weight, hypertension, lipid management, diabetes, heart failure and other comorbidities.;Nutrition handout(s) given to Lee.    Expected Outcomes Short Term Goal: Understand basic principles of dietary content, such as calories, fat, sodium, cholesterol and nutrients.;Long Term Goal: Adherence to prescribed nutrition plan.             Nutrition Assessments:  MEDIFICTS Score Key: >=70 Need to make dietary changes  40-70 Heart Healthy  Diet <= 40 Therapeutic Level Cholesterol Diet  Flowsheet Row CARDIAC REHAB PHASE II ORIENTATION from 06/06/2023 in [facility] CARDIAC REHABILITATION  Picture Your Plate Total Score on Admission 58      Picture Your Plate Scores: <16 Unhealthy dietary pattern with much room for improvement. 41-50 Dietary  pattern unlikely to meet recommendations for good health and room for improvement. 51-60 More healthful dietary pattern, with some room for improvement.  >60 Healthy dietary pattern, although there may be some specific behaviors that could be improved.    Nutrition Goals Re-Evaluation:   Nutrition Goals Discharge (Final Nutrition Goals Re-Evaluation):   Psychosocial: Target Goals: Acknowledge presence or absence of significant depression and/or stress, maximize coping skills, provide positive support system. Participant is able to verbalize types and ability to use techniques and skills needed for reducing stress and depression.  Initial Review & Psychosocial Screening:  Initial Psych Review & Screening - 06/04/23 1410       Initial Review   Current issues with Current Depression;History of Depression;Current Anxiety/Panic;Current Stress Concerns;Current Psychotropic Meds;Current Sleep Concerns    Source of Stress Concerns Chronic Illness;Family;Occupation    Comments mother passed recently, heart healthy issues started at age 14, thrid valve replacement, anxiety has xanax  and can take up to 3x a day, lexapro  is helpign with sleep, back to work in April      Family Dynamics   Good Support System? Yes   wife and father, friends at church   Comments recent passing of his mother      Barriers   Psychosocial barriers to participate in program The Lee should benefit from training in stress management and relaxation.;Psychosocial barriers identified (see note)      Screening Interventions   Interventions Encouraged to exercise;To provide support and resources with identified  psychosocial needs;Provide feedback about the scores to participant    Expected Outcomes Short Term goal: Utilizing psychosocial counselor, staff and physician to assist with identification of specific Stressors or current issues interfering with healing process. Setting desired goal for each stressor or current issue identified.;Long Term Goal: Stressors or current issues are controlled or eliminated.;Short Term goal: Identification and review with participant of any Quality of Life or Depression concerns found by scoring the questionnaire.;Long Term goal: The participant improves quality of Life and PHQ9 Scores as seen by post scores and/or verbalization of changes             Quality of Life Scores:  Quality of Life - 06/06/23 0946       Quality of Life   Select Quality of Life      Quality of Life Scores   Health/Function Pre 16.9 %    Socioeconomic Pre 17.64 %    Psych/Spiritual Pre 18.64 %    Family Pre 24 %    GLOBAL Pre 18.29 %            Scores of 19 and below usually indicate a poorer quality of life in these areas.  A difference of  2-3 points is a clinically meaningful difference.  A difference of 2-3 points in the total score of the Quality of Life Index has been associated with significant improvement in overall quality of life, self-image, physical symptoms, and general health in studies assessing change in quality of life.  PHQ-9: Review Flowsheet       06/06/2023  Depression screen PHQ 2/9  Decreased Interest 2  Down, Depressed, Hopeless 1  PHQ - 2 Score 3  Altered sleeping 1  Tired, decreased energy 1  Change in appetite 0  Feeling bad or failure about yourself  1  Trouble concentrating 1  Moving slowly or fidgety/restless 0  Suicidal thoughts 0  PHQ-9 Score 7  Difficult doing work/chores Somewhat difficult   Interpretation of Total Score  Total Score  Depression Severity:  1-4 = Minimal depression, 5-9 = Mild depression, 10-14 = Moderate depression,  15-19 = Moderately severe depression, 20-27 = Severe depression   Psychosocial Evaluation and Intervention:  Psychosocial Evaluation - 06/06/23 0950       Psychosocial Evaluation & Interventions   Interventions Stress management education;Relaxation education;Encouraged to exercise with the program and follow exercise prescription    Comments Lee has met his OOP maximum and does not have a co-payment. His PHQ-9 score was 7. He does feel depressed because he feels he is not able to contribute to the household expenses and this bothers him. He is looking forward to returning to work and believes these feelings will be resolved after he is able to work. He is looking forward to participating in the program.    Expected Outcomes Short: Attend rehab to improve strength and stamina Long: Conitnue to work to improve breathing and coping skills    Continue Psychosocial Services  Follow up required by staff             Psychosocial Re-Evaluation:   Psychosocial Discharge (Final Psychosocial Re-Evaluation):   Vocational Rehabilitation: Provide vocational rehab assistance to qualifying candidates.   Vocational Rehab Evaluation & Intervention:  Vocational Rehab - 06/04/23 1422       Initial Vocational Rehab Evaluation & Intervention   Assessment shows need for Vocational Rehabilitation No   returning to work in April            Education: Education Goals: Education classes will be provided on a weekly basis, covering required topics. Participant will state understanding/return demonstration of topics presented.  Learning Barriers/Preferences:  Learning Barriers/Preferences - 06/04/23 1421       Learning Barriers/Preferences   Learning Barriers None    Learning Preferences Written Material;Skilled Demonstration             Education Topics: Hypertension, Hypertension Reduction -Define heart disease and high blood pressure. Discus how high blood pressure affects the  body and ways to reduce high blood pressure.   Exercise and Your Heart -Discuss why it is important to exercise, the FITT principles of exercise, normal and abnormal responses to exercise, and how to exercise safely.   Angina -Discuss definition of angina, causes of angina, treatment of angina, and how to decrease risk of having angina.   Cardiac Medications -Review what the following cardiac medications are used for, how they affect the body, and side effects that may occur when taking the medications.  Medications include Aspirin , Beta blockers, calcium  channel blockers, ACE Inhibitors, angiotensin receptor blockers, diuretics, digoxin, and antihyperlipidemics.   Congestive Heart Failure -Discuss the definition of CHF, how to live with CHF, the signs and symptoms of CHF, and how keep track of weight and sodium intake.   Heart Disease and Intimacy -Discus the effect sexual activity has on the heart, how changes occur during intimacy as we age, and safety during sexual activity.   Smoking Cessation / COPD -Discuss different methods to quit smoking, the health benefits of quitting smoking, and the definition of COPD.   Nutrition I: Fats -Discuss the types of cholesterol, what cholesterol does to the heart, and how cholesterol levels can be controlled.   Nutrition II: Labels -Discuss the different components of food labels and how to read food label   Heart Parts/Heart Disease and PAD -Discuss the anatomy of the heart, the pathway of blood circulation through the heart, and these are affected by heart disease.   Stress I: Signs and Symptoms -  Discuss the causes of stress, how stress may lead to anxiety and depression, and ways to limit stress.   Stress II: Relaxation -Discuss different types of relaxation techniques to limit stress.   Warning Signs of Stroke / TIA -Discuss definition of a stroke, what the signs and symptoms are of a stroke, and how to identify when  someone is having stroke.   Knowledge Questionnaire Score:  Knowledge Questionnaire Score - 06/06/23 0946       Knowledge Questionnaire Score   Pre Score 18/24             Core Components/Risk Factors/Lee Goals at Admission:  Personal Goals and Risk Factors at Admission - 06/04/23 1422       Core Components/Risk Factors/Lee Goals on Admission    Weight Management Yes;Weight Loss;Obesity    Intervention Weight Management: Develop a combined nutrition and exercise program designed to reach desired caloric intake, while maintaining appropriate intake of nutrient and fiber, sodium and fats, and appropriate energy expenditure required for the weight goal.;Weight Management: Provide education and appropriate resources to help participant work on and attain dietary goals.;Weight Management/Obesity: Establish reasonable short term and long term weight goals.;Obesity: Provide education and appropriate resources to help participant work on and attain dietary goals.    Expected Outcomes Short Term: Continue to assess and modify interventions until short term weight is achieved;Long Term: Adherence to nutrition and physical activity/exercise program aimed toward attainment of established weight goal;Weight Loss: Understanding of general recommendations for a balanced deficit meal plan, which promotes 1-2 lb weight loss per week and includes a negative energy balance of [phone] kcal/d;Understanding recommendations for meals to include 15-35% energy as protein, 25-35% energy from fat, 35-60% energy from carbohydrates, less than 200mg  of dietary cholesterol, 20-35 gm of total fiber daily;Understanding of distribution of calorie intake throughout the day with the consumption of 4-5 meals/snacks    Tobacco Cessation Yes    Number of packs per day 0    Intervention Offer self-teaching materials, assist with locating and accessing local/national Quit Smoking programs, and support quit date  choice.;Assist the participant in steps to quit. Provide individualized education and counseling about committing to Tobacco Cessation, relapse prevention, and pharmacological support that can be provided by physician.    Expected Outcomes Long Term: Complete abstinence from all tobacco products for at least 12 months from quit date.    Improve shortness of breath with ADL's Yes    Intervention Provide education, individualized exercise plan and daily activity instruction to help decrease symptoms of SOB with activities of daily living.    Expected Outcomes Short Term: Improve cardiorespiratory fitness to achieve a reduction of symptoms when performing ADLs;Long Term: Be able to perform more ADLs without symptoms or delay the onset of symptoms    Hypertension Yes    Intervention Provide education on lifestyle modifcations including regular physical activity/exercise, weight management, moderate sodium restriction and increased consumption of fresh fruit, vegetables, and low fat dairy, alcohol moderation, and smoking cessation.;Monitor prescription use compliance.    Expected Outcomes Short Term: Continued assessment and intervention until BP is < 140/65mm HG in hypertensive participants. < 130/37mm HG in hypertensive participants with diabetes, heart failure or chronic kidney disease.;Long Term: Maintenance of blood pressure at goal levels.             Core Components/Risk Factors/Lee Goals Review:    Core Components/Risk Factors/Lee Goals at Discharge (Final Review):    ITP Comments:  ITP Comments     Row Name  06/04/23 1409 06/06/23 1005 06/13/23 0941 07/11/23 1014     ITP Comments Completed virtual orientation today.  EP evaluation is scheduled for Wed 06/06/23 at 800 .  Documentation for diagnosis can be found in [facility] encounter 04/20/23 OV and admission.  Surgery was on 04/26/23. Lee arrived for 1st visit/orientation/education at 0800. Lee was referred to CR by Dr. Hilda Lovings with Dr. Mallipeddi attending due to Status Post mechanical aortic valve replacement. During orientation advised Lee on arrival and appointment times what to wear, what to do before, during and after exercise. Reviewed attendance and class policy.  Pt is scheduled to return Cardiac Rehab on 06/08/23 at 1100. Pt was advised to come to class 15 minutes before class starts.  Discussed RPE/Dpysnea scales. Lee participated in warm up stretches. Lee was able to complete 6 minute walk test.  Telemetry:Wide QRS/NSR. Lee was measured for the equipment. Discussed equipment safety with Lee. Took Lee pre-anthropometric measurements. Lee finished visit at 0945. 30 day review completed. ITP sent to Dr. Armida Lander, Medical Director of Cardiac Rehab. Continue with ITP unless changes are made by physician.  New to program, has only completed first day of exercise thus far. 30 day review completed. ITP sent to Dr. Armida Lander, Medical Director of Cardiac Rehab. Continue with ITP unless changes are made by physician.  Has not attended since being admitted despite being cleared to return.  Last attended on 06/13/23, unable to asses for goals             Comments: 30 day review

## 2023-07-13 ENCOUNTER — Encounter (HOSPITAL_COMMUNITY)

## 2023-07-16 ENCOUNTER — Encounter

## 2023-07-16 ENCOUNTER — Encounter (HOSPITAL_COMMUNITY)

## 2023-07-18 ENCOUNTER — Encounter (INDEPENDENT_AMBULATORY_CARE_PROVIDER_SITE_OTHER)

## 2023-07-18 ENCOUNTER — Encounter (HOSPITAL_COMMUNITY)

## 2023-07-18 DIAGNOSIS — Z952 Presence of prosthetic heart valve: Secondary | ICD-10-CM

## 2023-07-18 DIAGNOSIS — Z01810 Encounter for preprocedural cardiovascular examination: Secondary | ICD-10-CM

## 2023-07-18 DIAGNOSIS — Z5181 Encounter for therapeutic drug level monitoring: Secondary | ICD-10-CM

## 2023-07-18 LAB — POCT INR: INR: 2.8 (ref 2.0–3.0)

## 2023-07-18 NOTE — Patient Instructions (Signed)
 Description   On Amiodarone  200mg  daily started 06/20/2023 Continue on same dosage of Warfarin 1 tablet tonight then resume 1/2 tablet daily except 1 tablet on Sundays. Recheck in 2 wks

## 2023-07-20 ENCOUNTER — Encounter (HOSPITAL_COMMUNITY)

## 2023-07-23 ENCOUNTER — Encounter (HOSPITAL_COMMUNITY): Payer: Self-pay | Admitting: *Deleted

## 2023-07-23 ENCOUNTER — Encounter (HOSPITAL_COMMUNITY): Admission: RE | Admit: 2023-07-23 | Source: Ambulatory Visit

## 2023-07-23 DIAGNOSIS — Z952 Presence of prosthetic heart valve: Secondary | ICD-10-CM

## 2023-07-23 NOTE — Progress Notes (Signed)
 Cardiac Individual Treatment Plan  Lee Details  Name: Chase Lee MRN: [id number] Date of Birth: 1980-05-19 Referring Provider:   Flowsheet Row CARDIAC REHAB PHASE II ORIENTATION from 06/06/2023 in [facility] CARDIAC REHABILITATION  Referring Provider Chase Patient MD       Initial Encounter Date:  Flowsheet Row CARDIAC REHAB PHASE II ORIENTATION from 06/06/2023 in [place] CARDIAC REHABILITATION  Date 06/06/23       Visit Diagnosis: S/P AVR (aortic valve replacement)  Lee's Home Medications on Admission:  Current Outpatient Medications:    acetaminophen  (TYLENOL ) 500 MG tablet, Take 1,000 mg by mouth every 6 (six) hours as needed for mild pain (pain score 1-3)., Disp: , Rfl:    ALPRAZolam  (XANAX ) 1 MG tablet, TAKE 1 TABLET(1 MG) BY MOUTH THREE TIMES DAILY AS NEEDED FOR ANXIETY, Disp: 90 tablet, Rfl: 0   amiodarone  (PACERONE ) 200 MG tablet, Take 1 tablet (200 mg total) by mouth daily., Disp: , Rfl:    ascorbic acid (VITAMIN C ) 500 MG tablet, Take 1 tablet (500 mg total) by mouth daily with breakfast., Disp: 30 tablet, Rfl: 1   aspirin  EC 81 MG tablet, Take 1 tablet (81 mg total) by mouth daily. Swallow whole., Disp: 30 tablet, Rfl: 12   escitalopram  (LEXAPRO ) 10 MG tablet, TAKE 1 TABLET(10 MG) BY MOUTH DAILY (Lee taking differently: Take 10 mg by mouth at bedtime. TAKE 1 TABLET(10 MG) BY MOUTH DAILY), Disp: 90 tablet, Rfl: 0   ferrous sulfate  325 (65 FE) MG EC tablet, Take 1 tablet (325 mg total) by mouth daily with breakfast., Disp: 30 tablet, Rfl: 1   furosemide  (LASIX ) 40 MG tablet, Take 1 tablet (40 mg total) by mouth daily., Disp: 30 tablet, Rfl: 1   lisinopril  (ZESTRIL ) 40 MG tablet, Take 1 tablet (40 mg total) by mouth daily., Disp: 90 tablet, Rfl: 0   metoprolol  tartrate (LOPRESSOR ) 25 MG tablet, Take 1 tablet (25 mg total) by mouth 2 (two) times daily., Disp: 30 tablet, Rfl: 5   omeprazole  (PRILOSEC) 40 MG capsule, Take 1 capsule (40 mg total) by  mouth 2 (two) times daily., Disp: 60 capsule, Rfl: 1   rosuvastatin  (CRESTOR ) 10 MG tablet, Take 1 tablet (10 mg total) by mouth daily., Disp: 30 tablet, Rfl: 1   warfarin (COUMADIN ) 5 MG tablet, Take 1 tablet (5 mg total) by mouth daily., Disp: 30 tablet, Rfl: 5  Past Medical History: Past Medical History:  Diagnosis Date   Anxiety    History of artificial heart valve    a. s/p aortic valvuloplasty at age 79 b. subsequent mechanical AVR at 43 yo c. 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   HTN (hypertension) 04/05/2023    Tobacco Use: Social History   Tobacco Use  Smoking Status Former   Current packs/day: 0.50   Average packs/day: 0.5 packs/day for 0.3 years (0.1 ttl pk-yrs)   Types: Cigarettes   Start date: 04/06/2023   Quit date: 08/10/2019   Passive exposure: Never  Smokeless Tobacco Never  Tobacco Comments   Tries to chew nicotine gum at work - smoking 3 ciggs per day     Labs: Review Flowsheet       Latest Ref Rng & Units 04/26/2023 04/27/2023 06/18/2023 06/19/2023  Labs for ITP Cardiac and Pulmonary Rehab  Cholestrol 0 - 200 mg/dL - - - 284   LDL (calc) 0 - 99 mg/dL - - - 132  HDL-C >40 mg/dL - - - 41   Trlycerides <150 mg/dL - - - 93   Hemoglobin Y4I 4.8 - 5.6 % - 6.5  - -  PH, Arterial 7.35 - 7.45 7.334  7.330  7.350  7.319  7.349  7.391  7.418  7.275  7.391  7.343  - -  PCO2 arterial 32 - 48 mmHg 33.2  43.7  40.4  48.7  41.8  39.9  34.7  54.9  36.9  39.1  - -  Bicarbonate 20.0 - 28.0 mmol/L 17.7  23.1  22.4  25.4  23.0  24.2  22.4  25.5  22.9  22.4  21.3  - -  TCO2 22 - 32 mmol/L 19  24  24  27  22  24  25  25  25  23  25  27  26  23  24  24  23  24  22  25   -  Acid-base deficit 0.0 - 2.0 mmol/L 7.0  3.0  3.0  1.0  2.0  1.0  2.0  2.0  3.0  2.0  4.0  - -  O2 Saturation % 95  92  91  93  95  100  100  100  71  100  95  - -    Details       Multiple values from one day are sorted in  reverse-chronological order         Capillary Blood Glucose: Lab Results  Component Value Date   GLUCAP 106 (H) 04/29/2023   GLUCAP 129 (H) 04/28/2023   GLUCAP 122 (H) 04/28/2023   GLUCAP 171 (H) 04/28/2023   GLUCAP 168 (H) 04/28/2023     Exercise Target Goals: Exercise Program Goal: Individual exercise prescription set using results from initial 6 min walk test and THRR while considering  Lee's activity barriers and safety.   Exercise Prescription Goal: Starting with aerobic activity 30 plus minutes a day, 3 days per week for initial exercise prescription. Provide home exercise prescription and guidelines that participant acknowledges understanding prior to discharge.  Activity Barriers & Risk Stratification:  Activity Barriers & Cardiac Risk Stratification - 06/04/23 1409       Activity Barriers & Cardiac Risk Stratification   Activity Barriers Incisional Pain;Joint Problems   occassional stiff joints   Cardiac Risk Stratification High             6 Minute Walk:  6 Minute Walk     Row Name 06/06/23 0940         6 Minute Walk   Phase Initial     Distance 910 feet     Walk Time 6 minutes     # of Rest Breaks 0     MPH 1.72     METS 3.01     RPE 12     Perceived Dyspnea  1     VO2 Peak 10.54     Symptoms No     Resting HR 62 bpm     Resting BP 150/80     Resting Oxygen Saturation  97 %     Exercise Oxygen Saturation  during 6 min walk 97 %     Max Ex. HR 80 bpm     Max Ex. BP 146/78     2 Minute Post BP 132/78              Oxygen Initial Assessment:   Oxygen Re-Evaluation:   Oxygen Discharge (Final Oxygen Re-Evaluation):  Initial Exercise Prescription:  Initial Exercise Prescription - 06/06/23 0900       Date of Initial Exercise RX and Referring Provider   Date 06/06/23    Referring Provider Chase Patient MD      Treadmill   MPH 2    Grade 0.5    Minutes 15    METs 2.67      Bike   Level 5    Minutes 15    METs  1.8      Prescription Details   Frequency (times per week) 3    Duration Progress to 30 minutes of continuous aerobic without signs/symptoms of physical distress      Intensity   THRR 40-80% of Max Heartrate 107-152    Ratings of Perceived Exertion 11-13    Perceived Dyspnea 0-4      Resistance Training   Training Prescription Yes    Weight 5    Reps 10-15             Perform Capillary Blood Glucose checks as needed.  Exercise Prescription Changes:   Exercise Comments:   Exercise Comments     Row Name 06/08/23 1138           Exercise Comments First full day of exercise!  Lee was oriented to gym and equipment including functions, settings, policies, and procedures.  Lee's individual exercise prescription and treatment plan were reviewed.  All starting workloads were established based on the results of the 6 minute walk test done at initial orientation visit.  The plan for exercise progression was also introduced and progression will be customized based on Lee's performance and goals.                Exercise Goals and Review:   Exercise Goals     Row Name 06/04/23 1410             Exercise Goals   Increase Physical Activity Yes       Intervention Provide advice, education, support and counseling about physical activity/exercise needs.;Develop an individualized exercise prescription for aerobic and resistive training based on initial evaluation findings, risk stratification, comorbidities and participant's personal goals.       Expected Outcomes Long Term: Exercising regularly at least 3-5 days a week.;Short Term: Attend rehab on a regular basis to increase amount of physical activity.;Long Term: Add in home exercise to make exercise part of routine and to increase amount of physical activity.       Increase Strength and Stamina Yes       Intervention Provide advice, education, support and counseling about physical activity/exercise needs.;Develop  an individualized exercise prescription for aerobic and resistive training based on initial evaluation findings, risk stratification, comorbidities and participant's personal goals.       Expected Outcomes Short Term: Increase workloads from initial exercise prescription for resistance, speed, and METs.;Long Term: Improve cardiorespiratory fitness, muscular endurance and strength as measured by increased METs and functional capacity ( );Short Term: Perform resistance training exercises routinely during rehab and add in resistance training at home       Able to understand and use rate of perceived exertion (RPE) scale Yes       Intervention Provide education and explanation on how to use RPE scale       Expected Outcomes Short Term: Able to use RPE daily in rehab to express subjective intensity level;Long Term:  Able to use RPE to guide intensity level when exercising independently  Able to understand and use Dyspnea scale Yes       Intervention Provide education and explanation on how to use Dyspnea scale       Expected Outcomes Short Term: Able to use Dyspnea scale daily in rehab to express subjective sense of shortness of breath during exertion;Long Term: Able to use Dyspnea scale to guide intensity level when exercising independently       Knowledge and understanding of Target Heart Rate Range (THRR) Yes       Intervention Provide education and explanation of THRR including how the numbers were predicted and where they are located for reference       Expected Outcomes Short Term: Able to state/look up THRR;Long Term: Able to use THRR to govern intensity when exercising independently;Short Term: Able to use daily as guideline for intensity in rehab       Able to check pulse independently Yes       Intervention Provide education and demonstration on how to check pulse in carotid and radial arteries.;Review the importance of being able to check your own pulse for safety during independent  exercise       Expected Outcomes Short Term: Able to explain why pulse checking is important during independent exercise;Long Term: Able to check pulse independently and accurately       Understanding of Exercise Prescription Yes       Intervention Provide education, explanation, and written materials on Lee's individual exercise prescription       Expected Outcomes Short Term: Able to explain program exercise prescription;Long Term: Able to explain home exercise prescription to exercise independently                Exercise Goals Re-Evaluation :    Discharge Exercise Prescription (Final Exercise Prescription Changes):   Nutrition:  Target Goals: Understanding of nutrition guidelines, daily intake of sodium 1500mg , cholesterol 200mg , calories 30% from fat and 7% or less from saturated fats, daily to have 5 or more servings of fruits and vegetables.  Biometrics:  Pre Biometrics - 06/06/23 0944       Pre Biometrics   Height 5' 7.5" (1.715 m)    Weight 230 lb 13.2 oz (104.7 kg)    Waist Circumference 42 inches    Hip Circumference 44 inches    Waist to Hip Ratio 0.95 %    BMI (Calculated) 35.6    Grip Strength 44.5 kg    Single Leg Stand 10.14 seconds              Nutrition Therapy Plan and Nutrition Goals:  Nutrition Therapy & Goals - 06/04/23 1421       Intervention Plan   Intervention Prescribe, educate and counsel regarding individualized specific dietary modifications aiming towards targeted core components such as weight, hypertension, lipid management, diabetes, heart failure and other comorbidities.;Nutrition handout(s) given to Lee.    Expected Outcomes Short Term Goal: Understand basic principles of dietary content, such as calories, fat, sodium, cholesterol and nutrients.;Long Term Goal: Adherence to prescribed nutrition plan.             Nutrition Assessments:  MEDIFICTS Score Key: >=70 Need to make dietary changes  40-70 Heart Healthy  Diet <= 40 Therapeutic Level Cholesterol Diet  Flowsheet Row CARDIAC REHAB PHASE II ORIENTATION from 06/06/2023 in [facility] CARDIAC REHABILITATION  Picture Your Plate Total Score on Admission 58      Picture Your Plate Scores: <42 Unhealthy dietary pattern with much room for improvement. 41-50 Dietary  pattern unlikely to meet recommendations for good health and room for improvement. 51-60 More healthful dietary pattern, with some room for improvement.  >60 Healthy dietary pattern, although there may be some specific behaviors that could be improved.    Nutrition Goals Re-Evaluation:   Nutrition Goals Discharge (Final Nutrition Goals Re-Evaluation):   Psychosocial: Target Goals: Acknowledge presence or absence of significant depression and/or stress, maximize coping skills, provide positive support system. Participant is able to verbalize types and ability to use techniques and skills needed for reducing stress and depression.  Initial Review & Psychosocial Screening:  Initial Psych Review & Screening - 06/04/23 1410       Initial Review   Current issues with Current Depression;History of Depression;Current Anxiety/Panic;Current Stress Concerns;Current Psychotropic Meds;Current Sleep Concerns    Source of Stress Concerns Chronic Illness;Family;Occupation    Comments mother passed recently, heart healthy issues started at age 8, thrid valve replacement, anxiety has xanax  and can take up to 3x a day, lexapro  is helpign with sleep, back to work in April      Family Dynamics   Good Support System? Yes   wife and father, friends at church   Comments recent passing of his mother      Barriers   Psychosocial barriers to participate in program The Lee should benefit from training in stress management and relaxation.;Psychosocial barriers identified (see note)      Screening Interventions   Interventions Encouraged to exercise;To provide support and resources with identified  psychosocial needs;Provide feedback about the scores to participant    Expected Outcomes Short Term goal: Utilizing psychosocial counselor, staff and physician to assist with identification of specific Stressors or current issues interfering with healing process. Setting desired goal for each stressor or current issue identified.;Long Term Goal: Stressors or current issues are controlled or eliminated.;Short Term goal: Identification and review with participant of any Quality of Life or Depression concerns found by scoring the questionnaire.;Long Term goal: The participant improves quality of Life and PHQ9 Scores as seen by post scores and/or verbalization of changes             Quality of Life Scores:  Quality of Life - 06/06/23 0946       Quality of Life   Select Quality of Life      Quality of Life Scores   Health/Function Pre 16.9 %    Socioeconomic Pre 17.64 %    Psych/Spiritual Pre 18.64 %    Family Pre 24 %    GLOBAL Pre 18.29 %            Scores of 19 and below usually indicate a poorer quality of life in these areas.  A difference of  2-3 points is a clinically meaningful difference.  A difference of 2-3 points in the total score of the Quality of Life Index has been associated with significant improvement in overall quality of life, self-image, physical symptoms, and general health in studies assessing change in quality of life.  PHQ-9: Review Flowsheet       06/06/2023  Depression screen PHQ 2/9  Decreased Interest 2  Down, Depressed, Hopeless 1  PHQ - 2 Score 3  Altered sleeping 1  Tired, decreased energy 1  Change in appetite 0  Feeling bad or failure about yourself  1  Trouble concentrating 1  Moving slowly or fidgety/restless 0  Suicidal thoughts 0  PHQ-9 Score 7  Difficult doing work/chores Somewhat difficult   Interpretation of Total Score  Total Score  Depression Severity:  1-4 = Minimal depression, 5-9 = Mild depression, 10-14 = Moderate depression,  15-19 = Moderately severe depression, 20-27 = Severe depression   Psychosocial Evaluation and Intervention:  Psychosocial Evaluation - 06/06/23 0950       Psychosocial Evaluation & Interventions   Interventions Stress management education;Relaxation education;Encouraged to exercise with the program and follow exercise prescription    Comments Lee has met his OOP maximum and does not have a co-payment. His PHQ-9 score was 7. He does feel depressed because he feels he is not able to contribute to the household expenses and this bothers him. He is looking forward to returning to work and believes these feelings will be resolved after he is able to work. He is looking forward to participating in the program.    Expected Outcomes Short: Attend rehab to improve strength and stamina Long: Conitnue to work to improve breathing and coping skills    Continue Psychosocial Services  Follow up required by staff             Psychosocial Re-Evaluation:   Psychosocial Discharge (Final Psychosocial Re-Evaluation):   Vocational Rehabilitation: Provide vocational rehab assistance to qualifying candidates.   Vocational Rehab Evaluation & Intervention:  Vocational Rehab - 06/04/23 1422       Initial Vocational Rehab Evaluation & Intervention   Assessment shows need for Vocational Rehabilitation No   returning to work in April            Education: Education Goals: Education classes will be provided on a weekly basis, covering required topics. Participant will state understanding/return demonstration of topics presented.  Learning Barriers/Preferences:  Learning Barriers/Preferences - 06/04/23 1421       Learning Barriers/Preferences   Learning Barriers None    Learning Preferences Written Material;Skilled Demonstration             Education Topics: Hypertension, Hypertension Reduction -Define heart disease and high blood pressure. Discus how high blood pressure affects the  body and ways to reduce high blood pressure.   Exercise and Your Heart -Discuss why it is important to exercise, the FITT principles of exercise, normal and abnormal responses to exercise, and how to exercise safely.   Angina -Discuss definition of angina, causes of angina, treatment of angina, and how to decrease risk of having angina.   Cardiac Medications -Review what the following cardiac medications are used for, how they affect the body, and side effects that may occur when taking the medications.  Medications include Aspirin , Beta blockers, calcium  channel blockers, ACE Inhibitors, angiotensin receptor blockers, diuretics, digoxin, and antihyperlipidemics.   Congestive Heart Failure -Discuss the definition of CHF, how to live with CHF, the signs and symptoms of CHF, and how keep track of weight and sodium intake.   Heart Disease and Intimacy -Discus the effect sexual activity has on the heart, how changes occur during intimacy as we age, and safety during sexual activity.   Smoking Cessation / COPD -Discuss different methods to quit smoking, the health benefits of quitting smoking, and the definition of COPD.   Nutrition I: Fats -Discuss the types of cholesterol, what cholesterol does to the heart, and how cholesterol levels can be controlled.   Nutrition II: Labels -Discuss the different components of food labels and how to read food label   Heart Parts/Heart Disease and PAD -Discuss the anatomy of the heart, the pathway of blood circulation through the heart, and these are affected by heart disease.   Stress I: Signs and Symptoms -  Discuss the causes of stress, how stress may lead to anxiety and depression, and ways to limit stress.   Stress II: Relaxation -Discuss different types of relaxation techniques to limit stress.   Warning Signs of Stroke / TIA -Discuss definition of a stroke, what the signs and symptoms are of a stroke, and how to identify when  someone is having stroke.   Knowledge Questionnaire Score:  Knowledge Questionnaire Score - 06/06/23 0946       Knowledge Questionnaire Score   Pre Score 18/24             Core Components/Risk Factors/Lee Goals at Admission:  Personal Goals and Risk Factors at Admission - 06/04/23 1422       Core Components/Risk Factors/Lee Goals on Admission    Weight Management Yes;Weight Loss;Obesity    Intervention Weight Management: Develop a combined nutrition and exercise program designed to reach desired caloric intake, while maintaining appropriate intake of nutrient and fiber, sodium and fats, and appropriate energy expenditure required for the weight goal.;Weight Management: Provide education and appropriate resources to help participant work on and attain dietary goals.;Weight Management/Obesity: Establish reasonable short term and long term weight goals.;Obesity: Provide education and appropriate resources to help participant work on and attain dietary goals.    Expected Outcomes Short Term: Continue to assess and modify interventions until short term weight is achieved;Long Term: Adherence to nutrition and physical activity/exercise program aimed toward attainment of established weight goal;Weight Loss: Understanding of general recommendations for a balanced deficit meal plan, which promotes 1-2 lb weight loss per week and includes a negative energy balance of [phone] kcal/d;Understanding recommendations for meals to include 15-35% energy as protein, 25-35% energy from fat, 35-60% energy from carbohydrates, less than 200mg  of dietary cholesterol, 20-35 gm of total fiber daily;Understanding of distribution of calorie intake throughout the day with the consumption of 4-5 meals/snacks    Tobacco Cessation Yes    Number of packs per day 0    Intervention Offer self-teaching materials, assist with locating and accessing local/national Quit Smoking programs, and support quit date  choice.;Assist the participant in steps to quit. Provide individualized education and counseling about committing to Tobacco Cessation, relapse prevention, and pharmacological support that can be provided by physician.    Expected Outcomes Long Term: Complete abstinence from all tobacco products for at least 12 months from quit date.    Improve shortness of breath with ADL's Yes    Intervention Provide education, individualized exercise plan and daily activity instruction to help decrease symptoms of SOB with activities of daily living.    Expected Outcomes Short Term: Improve cardiorespiratory fitness to achieve a reduction of symptoms when performing ADLs;Long Term: Be able to perform more ADLs without symptoms or delay the onset of symptoms    Hypertension Yes    Intervention Provide education on lifestyle modifcations including regular physical activity/exercise, weight management, moderate sodium restriction and increased consumption of fresh fruit, vegetables, and low fat dairy, alcohol moderation, and smoking cessation.;Monitor prescription use compliance.    Expected Outcomes Short Term: Continued assessment and intervention until BP is < 140/52mm HG in hypertensive participants. < 130/53mm HG in hypertensive participants with diabetes, heart failure or chronic kidney disease.;Long Term: Maintenance of blood pressure at goal levels.             Core Components/Risk Factors/Lee Goals Review:    Core Components/Risk Factors/Lee Goals at Discharge (Final Review):    ITP Comments:  ITP Comments     Row Name  06/04/23 1409 06/06/23 1005 06/13/23 0941 07/11/23 1014 07/23/23 1630   ITP Comments Completed virtual orientation today.  EP evaluation is scheduled for Wed 06/06/23 at 800 .  Documentation for diagnosis can be found in [facility] encounter 04/20/23 OV and admission.  Surgery was on 04/26/23. Lee arrived for 1st visit/orientation/education at 0800. Lee was referred to CR by Dr.  Hilda Lovings with Dr. Mallipeddi attending due to Status Post mechanical aortic valve replacement. During orientation advised Lee on arrival and appointment times what to wear, what to do before, during and after exercise. Reviewed attendance and class policy.  Pt is scheduled to return Cardiac Rehab on 06/08/23 at 1100. Pt was advised to come to class 15 minutes before class starts.  Discussed RPE/Dpysnea scales. Lee participated in warm up stretches. Lee was able to complete 6 minute walk test.  Telemetry:Wide QRS/NSR. Lee was measured for the equipment. Discussed equipment safety with Lee. Took Lee pre-anthropometric measurements. Lee finished visit at 0945. 30 day review completed. ITP sent to Dr. Armida Lander, Medical Director of Cardiac Rehab. Continue with ITP unless changes are made by physician.  New to program, has only completed first day of exercise thus far. 30 day review completed. ITP sent to Dr. Armida Lander, Medical Director of Cardiac Rehab. Continue with ITP unless changes are made by physician.  Has not attended since being admitted despite being cleared to return.  Last attended on 06/13/23, unable to asses for goals Chase Lee has returned to work.  He will no longer be able to attend rehab.  We will discharge him at this time.            Comments: Discharge ITP

## 2023-07-23 NOTE — Progress Notes (Signed)
 Discharge Progress Report  Patient Details  Name: Chase Lee MRN: [id number] Date of Birth: 09/19/1980 Referring Provider:   Flowsheet Row CARDIAC REHAB PHASE II ORIENTATION from 06/06/2023 in [facility] CARDIAC REH[facility]  Referring Provider Kelly Patient MD        Number of Visits: 3/36  Reason for Discharge:  Early Exit:  Back to work  Smoking History:  Social History   Tobacco Use  Smoking Status Former   Current packs/day: 0.50   Average packs/day: 0.5 packs/day for 0.3 years (0.1 ttl pk-yrs)   Types: Cigarettes   Start date: 04/06/2023   Quit date: 08/10/2019   Passive exposure: Never  Smokeless Tobacco Never  Tobacco Comments   Tries to chew nicotine gum at work - smoking 3 ciggs per day     Diagnosis:  S/P AVR (aortic valve replacement)    Initial Exercise Prescription:  Initial Exercise Prescription - 06/06/23 0900       Date of Initial Exercise RX and Referring Provider   Date 06/06/23    Referring Provider Kelly Patient MD      Treadmill   MPH 2    Grade 0.5    Minutes 15    METs 2.67      Bike   Level 5    Minutes 15    METs 1.8      Prescription Details   Frequency (times per week) 3    Duration Progress to 30 minutes of continuous aerobic without signs/symptoms of physical distress      Intensity   THRR 40-80% of Max Heartrate 107-152    Ratings of Perceived Exertion 11-13    Perceived Dyspnea 0-4      Resistance Training   Training Prescription Yes    Weight 5    Reps 10-15              Functional Capacity:  6 Minute Walk     Row Name 06/06/23 0940         6 Minute Walk   Phase Initial     Distance 910 feet     Walk Time 6 minutes     # of Rest Breaks 0     MPH 1.72     METS 3.01     RPE 12     Perceived Dyspnea  1     VO2 Peak 10.54     Symptoms No     Resting HR 62 bpm     Resting BP 150/80     Resting Oxygen Saturation  97 %     Exercise Oxygen Saturation  during 6 min walk 97 %      Max Ex. HR 80 bpm     Max Ex. BP 146/78     2 Minute Post BP 132/78              Psychological, QOL, Others - Outcomes: PHQ 2/9:    06/06/2023    9:46 AM  Depression screen PHQ 2/9  Decreased Interest 2  Down, Depressed, Hopeless 1  PHQ - 2 Score 3  Altered sleeping 1  Tired, decreased energy 1  Change in appetite 0  Feeling bad or failure about yourself  1  Trouble concentrating 1  Moving slowly or fidgety/restless 0  Suicidal thoughts 0  PHQ-9 Score 7  Difficult doing work/chores Somewhat difficult    Quality of Life:  Quality of Life - 06/06/23 0946       Quality of Life  Select Quality of Life      Quality of Life Scores   Health/Function Pre 16.9 %    Socioeconomic Pre 17.64 %    Psych/Spiritual Pre 18.64 %    Family Pre 24 %    GLOBAL Pre 18.29 %            Nutrition & Weight - Outcomes:  Pre Biometrics - 06/06/23 0944       Pre Biometrics   Height 5' 7.5" (1.715 m)    Weight 230 lb 13.2 oz (104.7 kg)    Waist Circumference 42 inches    Hip Circumference 44 inches    Waist to Hip Ratio 0.95 %    BMI (Calculated) 35.6    Grip Strength 44.5 kg    Single Leg Stand 10.14 seconds

## 2023-07-25 ENCOUNTER — Ambulatory Visit: Payer: Self-pay | Admitting: Surgery

## 2023-07-25 ENCOUNTER — Encounter (HOSPITAL_COMMUNITY)

## 2023-07-26 ENCOUNTER — Other Ambulatory Visit: Payer: Self-pay | Admitting: Student

## 2023-07-27 ENCOUNTER — Encounter (HOSPITAL_COMMUNITY)

## 2023-07-30 ENCOUNTER — Encounter

## 2023-07-30 ENCOUNTER — Encounter (HOSPITAL_COMMUNITY)

## 2023-07-31 ENCOUNTER — Encounter (HOSPITAL_COMMUNITY): Attending: Internal Medicine | Admitting: *Deleted

## 2023-07-31 DIAGNOSIS — Z5181 Encounter for therapeutic drug level monitoring: Secondary | ICD-10-CM | POA: Insufficient documentation

## 2023-07-31 DIAGNOSIS — I480 Paroxysmal atrial fibrillation: Secondary | ICD-10-CM | POA: Diagnosis not present

## 2023-07-31 DIAGNOSIS — Z952 Presence of prosthetic heart valve: Secondary | ICD-10-CM | POA: Insufficient documentation

## 2023-07-31 LAB — POCT INR: INR: 4.6 — AB (ref 2.0–3.0)

## 2023-07-31 NOTE — Patient Instructions (Signed)
 On Amiodarone  200mg  daily started 06/20/2023 Hold warfarin tonight and tomorrow night then decrease dose to 1/2 tablet daily. Recheck in 2 wks

## 2023-08-01 ENCOUNTER — Other Ambulatory Visit: Payer: Self-pay

## 2023-08-01 ENCOUNTER — Telehealth: Payer: Self-pay | Admitting: Adult Health

## 2023-08-01 ENCOUNTER — Encounter (HOSPITAL_COMMUNITY)

## 2023-08-01 DIAGNOSIS — F410 Panic disorder [episodic paroxysmal anxiety] without agoraphobia: Secondary | ICD-10-CM

## 2023-08-01 DIAGNOSIS — F411 Generalized anxiety disorder: Secondary | ICD-10-CM

## 2023-08-01 NOTE — Telephone Encounter (Signed)
 Pt called asking for a refill on his xanax  1 mg. Pharmacy is [facility] in [place]. Next appt 08/08/23

## 2023-08-01 NOTE — Telephone Encounter (Signed)
Pended alprazolam 

## 2023-08-02 MED ORDER — ALPRAZOLAM 1 MG PO TABS: ORAL_TABLET | ORAL | 0 refills | Status: DC

## 2023-08-03 ENCOUNTER — Encounter (HOSPITAL_COMMUNITY)

## 2023-08-06 ENCOUNTER — Encounter (HOSPITAL_COMMUNITY)

## 2023-08-07 NOTE — Patient Instructions (Addendum)
 Chase Lee  08/07/2023     @PREFPERIOPPHARMACY @   Your procedure is scheduled on  08/15/2023.   Report to [facility] at  1145  A.M.   Call this number if you have problems the morning of surgery:  [phone]  If you experience any cold or flu symptoms such as cough, fever, chills, shortness of breath, etc. between now and your scheduled surgery, please notify us  at the above number.   Remember:  Your last dose of coumadin  should be on 08/09/2023.           Do your lovenox as prescribed by pharmacy.        Follow the diet and prep instructions given to you by the office.    You may drink clear liquids until  0945 am on 08/15/2023.    Clear liquids allowed are:                    Water, Juice (No red color; non-citric and without pulp; diabetics please choose diet or no sugar options), Carbonated beverages (diabetics please choose diet or no sugar options), Clear Tea (No creamer, milk, or cream, including half & half and powdered creamer), Black Coffee Only (No creamer, milk or cream, including half & half and powdered creamer), and Clear Sports drink (No red color; diabetics please choose diet or no sugar options)    Take these medicines the morning of surgery with A SIP OF WATER          alprazolam  (if needed), amiodarone , metoprolol , omeprazole .     Do not wear jewelry, make-up or nail polish, including gel polish,  artificial nails, or any other type of covering on natural nails (fingers and  toes).  Do not wear lotions, powders, or perfumes, or deodorant.  Do not shave 48 hours prior to surgery.  Men may shave face and neck.  Do not bring valuables to the hospital.  [facility] is not responsible for any belongings or valuables.  Contacts, dentures or bridgework may not be worn into surgery.  Leave your suitcase in the car.  After surgery it may be brought to your room.  For patients admitted to the hospital, discharge time will be determined by your  treatment team.  Patients discharged the day of surgery will not be allowed to drive home and must have someone with them for 24 hours.    Special instructions:  DO NOT smoke tobacco or vape for 24 hours before your procedure.  Please read over the following fact sheets that you were given. Anesthesia Post-op Instructions and Care and Recovery After Surgery       Upper Endoscopy, Adult, Care After After the procedure, it is common to have a sore throat. It is also common to have: Mild stomach pain or discomfort. Bloating. Nausea. Follow these instructions at home: The instructions below may help you care for yourself at home. Your health care provider may give you more instructions. If you have questions, ask your health care provider. If you were given a sedative during the procedure, it can affect you for several hours. Do not drive or operate machinery until your health care provider says that it is safe. If you will be going home right after the procedure, plan to have a responsible adult: Take you home from the hospital or clinic. You will not be allowed to drive. Care for you for the time you are told. Follow instructions from  your health care provider about what you may eat and drink. Return to your normal activities as told by your health care provider. Ask your health care provider what activities are safe for you. Take over-the-counter and prescription medicines only as told by your health care provider. Contact a health care provider if you: Have a sore throat that lasts longer than one day. Have trouble swallowing. Have a fever. Get help right away if you: Vomit blood or your vomit looks like coffee grounds. Have bloody, black, or tarry stools. Have a very bad sore throat or you cannot swallow. Have difficulty breathing or very bad pain in your chest or abdomen. These symptoms may be an emergency. Get help right away. Call 911. Do not wait to see if the symptoms will  go away. Do not drive yourself to the hospital. Summary After the procedure, it is common to have a sore throat, mild stomach discomfort, bloating, and nausea. If you were given a sedative during the procedure, it can affect you for several hours. Do not drive until your health care provider says that it is safe. Follow instructions from your health care provider about what you may eat and drink. Return to your normal activities as told by your health care provider. This information is not intended to replace advice given to you by your health care provider. Make sure you discuss any questions you have with your health care provider. Document Revised: 06/15/2021 Document Reviewed: 06/15/2021 Elsevier Patient Education  2024 [employer]Colonoscopy, Adult, Care After The following information offers guidance on how to care for yourself after your procedure. Your health care provider may also give you more specific instructions. If you have problems or questions, contact your health care provider. What can I expect after the procedure? After the procedure, it is common to have: A small amount of blood in your stool for 24 hours after the procedure. Some gas. Mild cramping or bloating of your abdomen. Follow these instructions at home: Eating and drinking  Drink enough fluid to keep your urine pale yellow. Follow instructions from your health care provider about eating or drinking restrictions. Resume your normal diet as told by your health care provider. Avoid heavy or fried foods that are hard to digest. Activity Rest as told by your health care provider. Avoid sitting for a long time without moving. Get up to take short walks every 1-2 hours. This is important to improve blood flow and breathing. Ask for help if you feel weak or unsteady. Return to your normal activities as told by your health care provider. Ask your health care provider what activities are safe for you. Managing cramping  and bloating  Try walking around when you have cramps or feel bloated. If directed, apply heat to your abdomen as told by your health care provider. Use the heat source that your health care provider recommends, such as a moist heat pack or a heating pad. Place a towel between your skin and the heat source. Leave the heat on for 20-30 minutes. Remove the heat if your skin turns bright red. This is especially important if you are unable to feel pain, heat, or cold. You have a greater risk of getting burned. General instructions If you were given a sedative during the procedure, it can affect you for several hours. Do not drive or operate machinery until your health care provider says that it is safe. For the first 24 hours after the procedure: Do not sign important documents. Do  not drink alcohol. Do your regular daily activities at a slower pace than normal. Eat soft foods that are easy to digest. Take over-the-counter and prescription medicines only as told by your health care provider. Keep all follow-up visits. This is important. Contact a health care provider if: You have blood in your stool 2-3 days after the procedure. Get help right away if: You have more than a small spotting of blood in your stool. You have large blood clots in your stool. You have swelling of your abdomen. You have nausea or vomiting. You have a fever. You have increasing pain in your abdomen that is not relieved with medicine. These symptoms may be an emergency. Get help right away. Call 911. Do not wait to see if the symptoms will go away. Do not drive yourself to the hospital. Summary After the procedure, it is common to have a small amount of blood in your stool. You may also have mild cramping and bloating of your abdomen. If you were given a sedative during the procedure, it can affect you for several hours. Do not drive or operate machinery until your health care provider says that it is safe. Get help  right away if you have a lot of blood in your stool, nausea or vomiting, a fever, or increased pain in your abdomen. This information is not intended to replace advice given to you by your health care provider. Make sure you discuss any questions you have with your health care provider. Document Revised: 04/18/2022 Document Reviewed: 10/27/2020 Elsevier Patient Education  2024 [employer].General Anesthesia, Adult, Care After The following information offers guidance on how to care for yourself after your procedure. Your health care provider may also give you more specific instructions. If you have problems or questions, contact your health care provider. What can I expect after the procedure? After the procedure, it is common for people to: Have pain or discomfort at the IV site. Have nausea or vomiting. Have a sore throat or hoarseness. Have trouble concentrating. Feel cold or chills. Feel weak, sleepy, or tired (fatigue). Have soreness and body aches. These can affect parts of the body that were not involved in surgery. Follow these instructions at home: For the time period you were told by your health care provider:  Rest. Do not participate in activities where you could fall or become injured. Do not drive or use machinery. Do not drink alcohol. Do not take sleeping pills or medicines that cause drowsiness. Do not make important decisions or sign legal documents. Do not take care of children on your own. General instructions Drink enough fluid to keep your urine pale yellow. If you have sleep apnea, surgery and certain medicines can increase your risk for breathing problems. Follow instructions from your health care provider about wearing your sleep device: Anytime you are sleeping, including during daytime naps. While taking prescription pain medicines, sleeping medicines, or medicines that make you drowsy. Return to your normal activities as told by your health care provider.  Ask your health care provider what activities are safe for you. Take over-the-counter and prescription medicines only as told by your health care provider. Do not use any products that contain nicotine or tobacco. These products include cigarettes, chewing tobacco, and vaping devices, such as e-cigarettes. These can delay incision healing after surgery. If you need help quitting, ask your health care provider. Contact a health care provider if: You have nausea or vomiting that does not get better with medicine. You vomit  every time you eat or drink. You have pain that does not get better with medicine. You cannot urinate or have bloody urine. You develop a skin rash. You have a fever. Get help right away if: You have trouble breathing. You have chest pain. You vomit blood. These symptoms may be an emergency. Get help right away. Call 911. Do not wait to see if the symptoms will go away. Do not drive yourself to the hospital. Summary After the procedure, it is common to have a sore throat, hoarseness, nausea, vomiting, or to feel weak, sleepy, or fatigue. For the time period you were told by your health care provider, do not drive or use machinery. Get help right away if you have difficulty breathing, have chest pain, or vomit blood. These symptoms may be an emergency. This information is not intended to replace advice given to you by your health care provider. Make sure you discuss any questions you have with your health care provider. Document Revised: 06/03/2021 Document Reviewed: 06/03/2021 Elsevier Patient Education  2024 [employer].

## 2023-08-08 ENCOUNTER — Encounter: Payer: Self-pay | Admitting: Adult Health

## 2023-08-08 ENCOUNTER — Telehealth (INDEPENDENT_AMBULATORY_CARE_PROVIDER_SITE_OTHER): Payer: PRIVATE HEALTH INSURANCE | Admitting: Adult Health

## 2023-08-08 ENCOUNTER — Encounter (HOSPITAL_COMMUNITY)

## 2023-08-08 DIAGNOSIS — F410 Panic disorder [episodic paroxysmal anxiety] without agoraphobia: Secondary | ICD-10-CM | POA: Diagnosis not present

## 2023-08-08 DIAGNOSIS — F331 Major depressive disorder, recurrent, moderate: Secondary | ICD-10-CM | POA: Diagnosis not present

## 2023-08-08 DIAGNOSIS — F411 Generalized anxiety disorder: Secondary | ICD-10-CM | POA: Diagnosis not present

## 2023-08-08 MED ORDER — ALPRAZOLAM 1 MG PO TABS: ORAL_TABLET | ORAL | 2 refills | Status: DC

## 2023-08-08 MED ORDER — ESCITALOPRAM OXALATE 10 MG PO TABS
ORAL_TABLET | ORAL | 0 refills | Status: DC
Start: 2023-08-08 — End: 2023-11-27

## 2023-08-08 NOTE — Progress Notes (Signed)
 Chase Lee [id number] [birth year] 43 y.o.  Virtual Visit via Video Note  I connected with pt @ on 08/08/23 at  2:00 PM EDT by a video enabled telemedicine application and verified that I am speaking with the correct person using two identifiers.   I discussed the limitations of evaluation and management by telemedicine and the availability of in person appointments. The patient expressed understanding and agreed to proceed.  I discussed the assessment and treatment plan with the patient. The patient was provided an opportunity to ask questions and all were answered. The patient agreed with the plan and demonstrated an understanding of the instructions.   The patient was advised to call back or seek an in-person evaluation if the symptoms worsen or if the condition fails to improve as anticipated.  I provided 20 minutes of non-face-to-face time during this encounter.  The patient was located at home.  The provider was located at [facility].   Reagan Camera, NP   Subjective:   Patient ID:  Chase Lee is a 43 y.o. (DOB 06/27/1980) male.  Chief Complaint: No chief complaint on file.   HPI Caulin Begley presents for follow-up of MDD, panic attacks and GAD.   Describes mood today as "ok". Pleasant. Reports tearfulness at times. Mood symptoms - reports decreased anxiety since returning to work. Reports some depression. Denies irritability Reports improving interest and motivation. Reports panic attacks. Reports some worry, rumination and over thinking. Denies obsessional thoughts or acts. Reports mood has improved. Stating "I feel like I'm doing ok". Feels like medications are helpful. Taking medications as prescribed.  Energy levels improved. Active, walking 3 miles at work.  Enjoys some usual interests and activities. Married. Lives with wife. He and wife doing well. Family in [place]. Spending time with family. Appetite adequate. Reports weight loss - 218  pounds. Reports sleeping well most nights. Averages 7 hours. Reports difficulties with focus and concentration at times - "it's not the best". Completing tasks. Managing aspects of household. Working full time. Denies SI or HI.  Denies AH or VH. Denies self harm. Denies substance use.   Previous medications: Prozac, Zoloft  Review of Systems:  Review of Systems  Musculoskeletal:  Negative for gait problem.  Neurological:  Negative for tremors.  Psychiatric/Behavioral:         Please refer to HPI    Medications: I have reviewed the patient's current medications.  Current Outpatient Medications  Medication Sig Dispense Refill   acetaminophen  (TYLENOL ) 500 MG tablet Take 1,000 mg by mouth every 6 (six) hours as needed for mild pain (pain score 1-3).     ALPRAZolam  (XANAX ) 1 MG tablet TAKE 1 TABLET(1 MG) BY MOUTH THREE TIMES DAILY AS NEEDED FOR ANXIETY 90 tablet 2   amiodarone  (PACERONE ) 200 MG tablet Take 1 tablet (200 mg total) by mouth daily. 90 tablet 3   ascorbic acid (VITAMIN C ) 500 MG tablet Take 1 tablet (500 mg total) by mouth daily with breakfast. 30 tablet 1   aspirin  EC 81 MG tablet Take 1 tablet (81 mg total) by mouth daily. Swallow whole. 30 tablet 12   escitalopram  (LEXAPRO ) 10 MG tablet TAKE 1 TABLET(10 MG) BY MOUTH DAILY 90 tablet 0   ferrous sulfate  325 (65 FE) MG EC tablet Take 1 tablet (325 mg total) by mouth daily with breakfast. 30 tablet 1   furosemide  (LASIX ) 40 MG tablet Take 1 tablet (40 mg total) by mouth daily. 30 tablet 1   lisinopril  (ZESTRIL )  40 MG tablet Take 1 tablet (40 mg total) by mouth daily. 90 tablet 0   metoprolol  tartrate (LOPRESSOR ) 25 MG tablet Take 1 tablet (25 mg total) by mouth 2 (two) times daily. 30 tablet 5   omeprazole  (PRILOSEC) 40 MG capsule Take 1 capsule (40 mg total) by mouth 2 (two) times daily. 60 capsule 1   rosuvastatin  (CRESTOR ) 10 MG tablet Take 1 tablet (10 mg total) by mouth daily. 30 tablet 1   warfarin (COUMADIN ) 5 MG  tablet Take 1 tablet (5 mg total) by mouth daily. 30 tablet 5   No current facility-administered medications for this visit.    Medication Side Effects: None  Allergies: No Known Allergies  Past Medical History:  Diagnosis Date   Anxiety    History of artificial heart valve    a. s/p aortic valvuloplasty at age 30 b. subsequent mechanical AVR at 43 yo c. 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   HTN (hypertension) 04/05/2023    Family History  Problem Relation Age of Onset   Depression Mother    Cancer Mother    Cancer Father    Colon cancer Neg Hx    Colon polyps Neg Hx     Social History   Socioeconomic History   Marital status: Married    Spouse name: Not on file   Number of children: 0   Years of education: Not on file   Highest education level: Some college, no degree  Occupational History   Occupation: Airline pilot    Comment: Works for [employer]  Tobacco Use   Smoking status: Former    Current packs/day: 0.50    Average packs/day: 0.5 packs/day for 0.3 years (0.2 ttl pk-yrs)    Types: Cigarettes    Start date: 04/06/2023    Quit date: 08/10/2019    Passive exposure: Never   Smokeless tobacco: Never   Tobacco comments:    Tries to chew nicotine gum at work - smoking 3 ciggs per day   Vaping Use   Vaping status: Never Used  Substance and Sexual Activity   Alcohol use: Never   Drug use: Never   Sexual activity: Yes    Birth control/protection: None  Other Topics Concern   Not on file  Social History Narrative   Not on file   Social Drivers of Health   Financial Resource Strain: Medium Risk (06/19/2023)   Overall Financial Resource Strain (CARDIA)    Difficulty of Paying Living Expenses: Somewhat hard  Food Insecurity: No Food Insecurity (06/19/2023)   Hunger Vital Sign    Worried About Running Out of Food in the Last Year: Never true    Ran Out of Food in the Last  Year: Never true  Transportation Needs: No Transportation Needs (06/19/2023)   PRAPARE - Administrator, Civil Service (Medical): No    Lack of Transportation (Non-Medical): No  Physical Activity: Not on file  Stress: Not on file  Social Connections: Socially Integrated (04/20/2023)   Social Connection and Isolation Panel [NHANES]    Frequency of Communication with Friends and Family: More than three times a week    Frequency of Social Gatherings with Friends and Family: More than three times a week    Attends Religious Services: 1 to 4 times per year    Active Member of [employer] or Organizations: Yes    Attends Banker Meetings: 1 to 4  times per year    Marital Status: Married  Catering manager Violence: Not At Risk (06/18/2023)   Humiliation, Afraid, Rape, and Kick questionnaire    Fear of Current or Ex-Partner: No    Emotionally Abused: No    Physically Abused: No    Sexually Abused: No    Past Medical History, Surgical history, Social history, and Family history were reviewed and updated as appropriate.   Please see review of systems for further details on the patient's review from today.   Objective:   Physical Exam:  There were no vitals taken for this visit.  Physical Exam Constitutional:      General: He is not in acute distress. Musculoskeletal:        General: No deformity.  Neurological:     Mental Status: He is alert and oriented to person, place, and time.     Coordination: Coordination normal.  Psychiatric:        Attention and Perception: Attention and perception normal. He does not perceive auditory or visual hallucinations.        Mood and Affect: Mood normal. Mood is not anxious or depressed. Affect is not labile, blunt, angry or inappropriate.        Speech: Speech normal.        Behavior: Behavior normal.        Thought Content: Thought content normal. Thought content is not paranoid or delusional. Thought content does not include  homicidal or suicidal ideation. Thought content does not include homicidal or suicidal plan.        Cognition and Memory: Cognition and memory normal.        Judgment: Judgment normal.     Comments: Insight intact     Lab Review:     Component Value Date/Time   NA 137 06/19/2023 0431   K 3.8 06/19/2023 0431   CL 101 06/19/2023 0431   CO2 28 06/19/2023 0431   GLUCOSE 120 (H) 06/19/2023 0431   BUN 21 (H) 06/19/2023 0431   CREATININE 1.18 06/19/2023 0431   CALCIUM  8.9 06/19/2023 0431   PROT 6.8 06/18/2023 0805   ALBUMIN  3.7 06/18/2023 0805   AST 25 06/18/2023 0805   ALT 29 06/18/2023 0805   ALKPHOS 94 06/18/2023 0805   BILITOT 0.5 06/18/2023 0805   GFRNONAA >60 06/19/2023 0431   GFRAA >60 08/12/2019 0734       Component Value Date/Time   WBC 6.6 06/19/2023 0431   RBC 3.74 (L) 06/19/2023 0431   HGB 8.9 (L) 06/19/2023 0431   HCT 29.7 (L) 06/19/2023 0431   PLT 278 06/19/2023 0431   MCV 79.4 (L) 06/19/2023 0431   MCH 23.8 (L) 06/19/2023 0431   MCHC 30.0 06/19/2023 0431   RDW 16.4 (H) 06/19/2023 0431   LYMPHSABS 1.0 06/18/2023 0805   MONOABS 0.5 06/18/2023 0805   EOSABS 0.1 06/18/2023 0805   BASOSABS 0.0 06/18/2023 0805    No results found for: "POCLITH", "LITHIUM"   No results found for: "PHENYTOIN", "PHENOBARB", "VALPROATE", "CBMZ"   .res Assessment: Plan:    Plan:  Lexapro  10mg  at hs Xanax  1mg  TID  RTC 3 months  20 minutes spent dedicated to the care of this patient on the date of this encounter to include pre-visit review of records, ordering of medication, post visit documentation, and face-to-face time with the patient discussing MDD, panic attacks and GAD. Discussed continuing current medication regimen.  Patient advised to contact office with any questions, adverse effects, or acute worsening in signs  and symptoms.  Discussed potential benefits, risk, and side effects of benzodiazepines to include potential risk of tolerance and dependence, as well as  possible drowsiness.  Advised patient not to drive if experiencing drowsiness and to take lowest possible effective dose to minimize risk of dependence and tolerance.  Diagnoses and all orders for this visit:  Generalized anxiety disorder -     ALPRAZolam  (XANAX ) 1 MG tablet; TAKE 1 TABLET(1 MG) BY MOUTH THREE TIMES DAILY AS NEEDED FOR ANXIETY -     escitalopram  (LEXAPRO ) 10 MG tablet; TAKE 1 TABLET(10 MG) BY MOUTH DAILY  Panic attacks -     ALPRAZolam  (XANAX ) 1 MG tablet; TAKE 1 TABLET(1 MG) BY MOUTH THREE TIMES DAILY AS NEEDED FOR ANXIETY -     escitalopram  (LEXAPRO ) 10 MG tablet; TAKE 1 TABLET(10 MG) BY MOUTH DAILY  Major depressive disorder, recurrent episode, moderate (HCC) -     escitalopram  (LEXAPRO ) 10 MG tablet; TAKE 1 TABLET(10 MG) BY MOUTH DAILY     Please see After Visit Summary for patient specific instructions.  Future Appointments  Date Time Provider Department Center  08/09/2023  9:00 AM [facility] PAT 1 [facility] None  08/14/2023 11:15 AM [facility]  [facility]  09/12/2023  9:30 AM Bartle, Shellie Dials, MD [facility]    No orders of the defined types were placed in this encounter.     -------------------------------

## 2023-08-09 ENCOUNTER — Telehealth: Payer: Self-pay | Admitting: *Deleted

## 2023-08-09 ENCOUNTER — Encounter (HOSPITAL_COMMUNITY): Payer: Self-pay

## 2023-08-09 ENCOUNTER — Encounter (HOSPITAL_COMMUNITY)
Admission: RE | Admit: 2023-08-09 | Discharge: 2023-08-09 | Disposition: A | Discharge status: Home / Self Care | Source: Ambulatory Visit | Attending: Gastroenterology | Admitting: Gastroenterology

## 2023-08-09 DIAGNOSIS — D509 Iron deficiency anemia, unspecified: Secondary | ICD-10-CM

## 2023-08-09 NOTE — Telephone Encounter (Signed)
 No show for PAT, No answer, left message to call us . His procedure is 5/28

## 2023-08-09 NOTE — Telephone Encounter (Signed)
 Called pt. He stated he thought he had already cancelled his procedure. He can't have done at this time. He needs to work as he was out of work for a while when hospitalized and has to make a living. He will call us  when ready to reschedule. Message sent to endo making aware

## 2023-08-10 ENCOUNTER — Encounter (HOSPITAL_COMMUNITY)

## 2023-08-13 ENCOUNTER — Encounter (HOSPITAL_COMMUNITY)

## 2023-08-14 ENCOUNTER — Encounter

## 2023-08-15 ENCOUNTER — Ambulatory Visit (HOSPITAL_COMMUNITY): Admission: RE | Admit: 2023-08-15 | Source: Home / Self Care | Admitting: Gastroenterology

## 2023-08-15 ENCOUNTER — Encounter (HOSPITAL_COMMUNITY)

## 2023-08-15 ENCOUNTER — Encounter (INDEPENDENT_AMBULATORY_CARE_PROVIDER_SITE_OTHER): Admitting: *Deleted

## 2023-08-15 ENCOUNTER — Encounter (HOSPITAL_COMMUNITY): Admission: RE | Payer: Self-pay | Source: Home / Self Care

## 2023-08-15 DIAGNOSIS — Z952 Presence of prosthetic heart valve: Secondary | ICD-10-CM | POA: Diagnosis not present

## 2023-08-15 DIAGNOSIS — I480 Paroxysmal atrial fibrillation: Secondary | ICD-10-CM

## 2023-08-15 DIAGNOSIS — Z5181 Encounter for therapeutic drug level monitoring: Secondary | ICD-10-CM | POA: Diagnosis not present

## 2023-08-15 LAB — POCT INR: INR: 2.2 (ref 2.0–3.0)

## 2023-08-15 SURGERY — COLONOSCOPY
Anesthesia: Choice

## 2023-08-15 NOTE — Patient Instructions (Signed)
 On Amiodarone  200mg  daily started 06/20/2023 Continue warfarin 1/2 tablet daily. Recheck in 3 wks

## 2023-08-16 ENCOUNTER — Ambulatory Visit

## 2023-08-17 ENCOUNTER — Encounter (HOSPITAL_COMMUNITY)

## 2023-08-20 ENCOUNTER — Encounter (HOSPITAL_COMMUNITY)

## 2023-08-22 ENCOUNTER — Encounter (HOSPITAL_COMMUNITY)

## 2023-08-24 ENCOUNTER — Encounter (HOSPITAL_COMMUNITY)

## 2023-08-27 ENCOUNTER — Encounter (HOSPITAL_COMMUNITY)

## 2023-08-27 DIAGNOSIS — I480 Paroxysmal atrial fibrillation: Secondary | ICD-10-CM | POA: Diagnosis not present

## 2023-08-27 DIAGNOSIS — I447 Left bundle-branch block, unspecified: Secondary | ICD-10-CM | POA: Diagnosis not present

## 2023-08-29 ENCOUNTER — Ambulatory Visit: Payer: Self-pay | Admitting: Internal Medicine

## 2023-08-29 DIAGNOSIS — I480 Paroxysmal atrial fibrillation: Secondary | ICD-10-CM

## 2023-09-03 NOTE — Telephone Encounter (Signed)
 Patient informed and verbalized understanding of plan. Referral sent

## 2023-09-05 ENCOUNTER — Encounter

## 2023-09-10 ENCOUNTER — Encounter (HOSPITAL_COMMUNITY): Attending: Internal Medicine | Admitting: *Deleted

## 2023-09-10 ENCOUNTER — Ambulatory Visit: Admitting: Cardiovascular Disease

## 2023-09-10 DIAGNOSIS — Z5181 Encounter for therapeutic drug level monitoring: Secondary | ICD-10-CM | POA: Insufficient documentation

## 2023-09-10 DIAGNOSIS — I480 Paroxysmal atrial fibrillation: Secondary | ICD-10-CM | POA: Insufficient documentation

## 2023-09-10 LAB — POCT INR: INR: 4.2 — AB (ref 2.0–3.0)

## 2023-09-10 NOTE — Patient Instructions (Signed)
 On Amiodarone  200mg  daily started 06/20/2023 Hold warfarin tonight then resume 1/2 tablet daily.  (Thinks he took a whole tablet over the weekend) Recheck in 2 wks

## 2023-09-10 NOTE — Progress Notes (Deleted)
  Electrophysiology Office Note:    Date:  09/10/2023   ID:  Chase Lee, DOB Jun 17, 1980, MRN [id number]  PCP:  [facility]   [facility] Providers Cardiologist:  Diannah SHAUNNA Maywood, MD { Click to update primary MD,subspecialty MD or APP then REFRESH:1}    Referring MD: Mallipeddi, Vishnu P, MD   History of Present Illness:    Chase Lee is a 43 y.o. male with a medical history significant for bicuspid aortic valve, referred for ***.     ***  Discussed the use of AI scribe software for clinical note transcription with the patient, who gave verbal consent to proceed.  History of Present Illness          Today, ***  EKGs/Labs/Other Studies Reviewed Today:     Echocardiogram:  TTE March 2025 LVEF 60 to 65%.  Grade 2 diastolic dysfunction.  Mildly elevated pulmonary artery systolic pressure.  Left atrium severely dilated.  Right atrium mild to moderately dilated.  Aortic valve replaced.   Monitors:  *** day monitor ***  -- my interpretation ***  Stress testing:  *** ***  Advanced imaging:  *** ***  Cardiac catherization  *** ***  EKG:         Physical Exam:    VS:  There were no vitals taken for this visit.    Wt Readings from Last 3 Encounters:  06/25/23 226 lb (102.5 kg)  06/19/23 226 lb 3.1 oz (102.6 kg)  06/06/23 230 lb 13.2 oz (104.7 kg)     GEN: *** Well nourished, well developed in no acute distress CARDIAC: ***RRR, no murmurs, rubs, gallops RESPIRATORY:  Normal work of breathing MUSCULOSKELETAL: *** edema    ASSESSMENT & PLAN:     *** ***  *** ***  *** ***  *** ***  *** ***    Signed, Eulas FORBES Furbish, MD  09/10/2023 9:17 AM    [facility]

## 2023-09-10 NOTE — Progress Notes (Signed)
 Electrophysiology Office Note:    Date:  09/11/2023   ID:  Chase Lee, DOB 1980-07-01, MRN [id number]  PCP:  [facility]   [facility] Providers Cardiologist:  Vishnu P Mallipeddi, MD Electrophysiologist:  Eulas FORBES Furbish, MD     Referring MD: Stacia Diannah SQUIBB, MD   History of Present Illness:    Chase Lee is a 43 y.o. male with a medical history significant for bicuspid aortic valve, referred for management of atrial fibrillation.      Discussed the use of AI scribe software for clinical note transcription with the patient, who gave verbal consent to proceed.  History of Present Illness Rusell Lee is a 43 year old male with a history of aortic valve surgeries who presents with atrial fibrillation. He was referred by Almarie Crate for evaluation of atrial fibrillation.  He has a history of aortic valve surgeries at ages 67, 89, and most recently at 27. His atrial fibrillation was first noted during his recent heart surgery in February 2025. He experienced palpitations and shortness of breath, which were new symptoms for him.  Postoperatively, he was placed on a heart monitor which showed a low burden of AF. He was started on amiodarone  during his hospital stay, which has been effective in managing his symptoms. He describes the medication as something he 'swears by' due to its effectiveness. He is currently on amiodarone  and warfarin.  He has experienced palpitations that felt like his heart was 'getting stuck' and then returning to normal rhythm. He has a history of anxiety for which he takes medication, and the palpitations have been distressing for him.         Today, he reports that he's doing well.  EKGs/Labs/Other Studies Reviewed Today:     Echocardiogram:  TTE March 2025 LVEF 60 to 65%.  Grade 2 diastolic dysfunction.  Mildly elevated pulmonary artery systolic pressure.  Left atrium severely dilated.   Right atrium mild to moderately dilated.  Aortic valve replaced.   Monitors:  14 day monitor February 2025-- my interpretation Sinus rhythm heart rate 31 to 90, average 59 bpm 9% burden of atrial fibrillation, average rate 87 bpm, longest 1 day 7 hours    EKG:   EKG Interpretation Date/Time:  Tuesday September 11 2023 14:20:54 EDT Ventricular Rate:  47 PR Interval:  200 QRS Duration:  176 QT Interval:  566 QTC Calculation: 500 R Axis:   257  Text Interpretation: Unusual P axis, possible ectopic atrial bradycardia Right superior axis deviation Left bundle branch block When compared with ECG of 18-Jun-2023 08:07, Suspicious axis change Confirmed by Furbish Eulas [phone]) on 09/11/2023 2:49:45 PM     Physical Exam:    VS:  BP (!) 108/58 (BP Location: Left Arm, Patient Position: Sitting, Cuff Size: Large)   Pulse (!) 47   Ht 5' 7 (1.702 m)   Wt 228 lb (103.4 kg)   SpO2 97%   BMI 35.71 kg/m     Wt Readings from Last 3 Encounters:  09/11/23 228 lb (103.4 kg)  06/25/23 226 lb (102.5 kg)  06/19/23 226 lb 3.1 oz (102.6 kg)     GEN:  Well nourished, well developed in no acute distress CARDIAC: RRR, no murmurs, rubs, gallops RESPIRATORY:  Normal work of breathing MUSCULOSKELETAL: no edema    ASSESSMENT & PLAN:     Atrial fibrillation Occurred in the postoperative period after aortic valve surgery Resolved with time, on amiodarone  Will discontinue amiodarone  and  observe We discussed the ablation procedure briefly in the event that he has recurrence We discussed EKG devices such as the AliveCor, and he will look into acquiring 1 of these He is on warfarin due to the mechanical valve  Aortic valve replacement Replacement a of aortic arch and mechanical valve replacement with an On-X mechanical valve 04/2023 On warfarin for anticoagulation      Signed, Eulas FORBES Furbish, MD  09/11/2023 2:50 PM    [facility]

## 2023-09-11 ENCOUNTER — Encounter (INDEPENDENT_AMBULATORY_CARE_PROVIDER_SITE_OTHER): Admitting: Cardiovascular Disease

## 2023-09-11 VITALS — BP 108/58 | HR 47 | Ht 67.0 in | Wt 228.0 lb

## 2023-09-11 DIAGNOSIS — I480 Paroxysmal atrial fibrillation: Secondary | ICD-10-CM

## 2023-09-11 NOTE — Patient Instructions (Signed)
 Medication Instructions:  STOP Amiodarone   *If you need a refill on your cardiac medications before your next appointment, please call your pharmacy*  Follow-Up: At [facility], you and your health needs are our priority.  As part of our continuing mission to provide you with exceptional heart care, our providers are all part of one team.  This team includes your primary Cardiologist (physician) and Advanced Practice Providers or APPs (Physician Assistants and Nurse Practitioners) who all work together to provide you with the care you need, when you need it.  Your next appointment:   6 month(s)  Provider:   You will follow up in the Atrial Fibrillation Clinic located at [facility] Your provider will be: Clint R. Fenton, PA-C or Fairy Heinrich, PA-C    Other Instructions Dr Nancey recommends for you to obtain a Kardia Mobile to monitor your heart rhythm at home  Catha is a smart device that can record a medical-grade electrocardiogram (EKG) right on your smartphone. EKGs measure the electrical activity of your heart and are used in hospitals to detect irregularities with your heart rate or rhythm, which may be indicators of a heart condition such as atrial fibrillation (AFib). KardiaMobile records a single-lead EKG, which provides you and your doctor with reliable information on your heart health. KardiaMobile can detect AFib, Bradycardia, and Tachycardia, with more determinations available with a [employer]. Catha is clinically validated, CE marked, and FDA-cleared, making it one of the most reliable ways to check in on your heart from home. Kardia Mobile device by [employer]  is approximately $90 and the phone application is free.  The web site is:  https://www.alivecor.com   Kardia Mobile - sending an EKG Download app and set up profile. Run EKG - by placing 1-2 fingers on the silver plates After EKG is complete - Download PDF - Skip  password (if you apply a password the provider will need it to view the EKG) Click share button (square with upward arrow) in bottom left corner To send: choose MyChart (first time log into MyChart) Pop up window about sending ECG Click continue Choose type of message Choose provider Type subject and message Click send (EKG should be attached) - To send additional EKGs in one message click the paperclip image and bottom of page to attach.

## 2023-09-12 ENCOUNTER — Ambulatory Visit: Payer: Self-pay | Admitting: Surgery

## 2023-09-24 ENCOUNTER — Ambulatory Visit: Attending: Internal Medicine | Admitting: *Deleted

## 2023-09-24 DIAGNOSIS — I480 Paroxysmal atrial fibrillation: Secondary | ICD-10-CM | POA: Diagnosis not present

## 2023-09-24 DIAGNOSIS — Z5181 Encounter for therapeutic drug level monitoring: Secondary | ICD-10-CM

## 2023-09-24 LAB — POCT INR: INR: 4.6 — AB (ref 2.0–3.0)

## 2023-09-24 NOTE — Patient Instructions (Signed)
 Stop using the warfarin 5mg  tablet and start using the 4mg  tablet. Stopping Amiodarone  Hold warfarin tonight then start taking warfarin 2mg  daily (1/2 of 4mg  tablet)  Recheck in 2 wks

## 2023-09-24 NOTE — Progress Notes (Signed)
Please see anticoagulation encounter.

## 2023-09-26 ENCOUNTER — Telehealth: Payer: Self-pay

## 2023-09-26 NOTE — Telephone Encounter (Signed)
 Patient reports he is struggling with anxiety - mother's death and his heart surgery. Note on alprazolam  Rx to not fill early. He last filled 6/13. 28 days is 7/11, but pharmacy won't fill.  He is asking if we can authorize fill on 7/11.

## 2023-09-26 NOTE — Telephone Encounter (Signed)
 He reports he only has enough to take 1 tablet on the 11th, instead of the 3 prescribed and then he will be out.

## 2023-09-28 NOTE — Telephone Encounter (Signed)
 Called patient and let him know that we would authorize RF on alprazolam . Called pharmacy and was told they had already filled it and his Suboxone.

## 2023-09-28 NOTE — Telephone Encounter (Signed)
 Chase Lee

## 2023-10-08 ENCOUNTER — Encounter: Attending: Internal Medicine | Admitting: *Deleted

## 2023-10-08 DIAGNOSIS — Z5181 Encounter for therapeutic drug level monitoring: Secondary | ICD-10-CM | POA: Diagnosis not present

## 2023-10-08 DIAGNOSIS — I480 Paroxysmal atrial fibrillation: Secondary | ICD-10-CM | POA: Insufficient documentation

## 2023-10-08 LAB — POCT INR: INR: 2.4 (ref 2.0–3.0)

## 2023-10-08 MED ORDER — WARFARIN SODIUM 2 MG PO TABS
2.0000 mg | ORAL_TABLET | Freq: Every day | ORAL | 5 refills | Status: DC
Start: 2023-10-08 — End: 2023-10-29

## 2023-10-08 NOTE — Progress Notes (Signed)
Please see anticoagulation encounter.

## 2023-10-08 NOTE — Patient Instructions (Signed)
 Use warfarin 2mg  tablet Stopping Amiodarone  Continue warfarin 2 mg (1 tablet) daily Recheck in 3 wks

## 2023-10-17 ENCOUNTER — Ambulatory Visit: Payer: Self-pay | Attending: Surgery | Admitting: Surgery

## 2023-10-17 ENCOUNTER — Encounter: Payer: Self-pay | Admitting: Surgery

## 2023-10-17 VITALS — BP 123/72 | HR 49 | Resp 18 | Ht 67.0 in | Wt 232.0 lb

## 2023-10-17 DIAGNOSIS — Z8679 Personal history of other diseases of the circulatory system: Secondary | ICD-10-CM

## 2023-10-17 DIAGNOSIS — Z9889 Other specified postprocedural states: Secondary | ICD-10-CM

## 2023-10-17 DIAGNOSIS — Z952 Presence of prosthetic heart valve: Secondary | ICD-10-CM

## 2023-10-17 DIAGNOSIS — I7121 Aneurysm of the ascending aorta, without rupture: Secondary | ICD-10-CM

## 2023-10-17 NOTE — Progress Notes (Unsigned)
   [address] [place] [address]             [phone]    HPI: ***  Current Outpatient Medications  Medication Sig Dispense Refill   acetaminophen  (TYLENOL ) 500 MG tablet Take 1,000 mg by mouth every 6 (six) hours as needed for mild pain (pain score 1-3).     ALPRAZolam  (XANAX ) 1 MG tablet TAKE 1 TABLET(1 MG) BY MOUTH THREE TIMES DAILY AS NEEDED FOR ANXIETY 90 tablet 2   ascorbic acid (VITAMIN C ) 500 MG tablet Take 1 tablet (500 mg total) by mouth daily with breakfast. 30 tablet 1   aspirin  EC 81 MG tablet Take 1 tablet (81 mg total) by mouth daily. Swallow whole. 30 tablet 12   Buprenorphine HCl-Naloxone HCl 4-1 MG FILM Place under the tongue 3 (three) times daily.     escitalopram  (LEXAPRO ) 10 MG tablet TAKE 1 TABLET(10 MG) BY MOUTH DAILY 90 tablet 0   furosemide  (LASIX ) 40 MG tablet Take 1 tablet (40 mg total) by mouth daily. 30 tablet 1   lisinopril  (ZESTRIL ) 40 MG tablet Take 1 tablet (40 mg total) by mouth daily. 90 tablet 0   metoprolol  tartrate (LOPRESSOR ) 25 MG tablet Take 1 tablet (25 mg total) by mouth 2 (two) times daily. 30 tablet 5   omeprazole  (PRILOSEC) 40 MG capsule Take 1 capsule (40 mg total) by mouth 2 (two) times daily. 60 capsule 1   warfarin (COUMADIN ) 2 MG tablet Take 1 tablet (2 mg total) by mouth daily. 30 tablet 5   No current facility-administered medications for this visit.     Physical Exam: ***  Diagnostic Tests: ***  Impression: ***  Plan: ***   Dorise MARLA Fellers, MD [facility] [phone]

## 2023-10-27 ENCOUNTER — Other Ambulatory Visit: Payer: Self-pay | Admitting: Internal Medicine

## 2023-10-29 ENCOUNTER — Ambulatory Visit: Attending: Internal Medicine | Admitting: *Deleted

## 2023-10-29 DIAGNOSIS — Z5181 Encounter for therapeutic drug level monitoring: Secondary | ICD-10-CM | POA: Diagnosis not present

## 2023-10-29 DIAGNOSIS — Z952 Presence of prosthetic heart valve: Secondary | ICD-10-CM

## 2023-10-29 LAB — POCT INR: INR: 2.4 (ref 2.0–3.0)

## 2023-10-29 MED ORDER — WARFARIN SODIUM 2 MG PO TABS: 2.0000 mg | ORAL_TABLET | Freq: Every day | ORAL | 5 refills | Status: DC

## 2023-10-29 NOTE — Patient Instructions (Signed)
 Use warfarin 2mg  tablet Stopping Amiodarone  Continue warfarin 2 mg (1 tablet) daily Recheck in 4 wks

## 2023-10-29 NOTE — Progress Notes (Signed)
 INR 2.4 Please see anticoagulation encounter

## 2023-11-08 ENCOUNTER — Ambulatory Visit: Admitting: Adult Health

## 2023-11-08 DIAGNOSIS — Z91199 Patient's noncompliance with other medical treatment and regimen due to unspecified reason: Secondary | ICD-10-CM

## 2023-11-08 NOTE — Progress Notes (Signed)
 Patient no show appointment. ? ?

## 2023-11-09 ENCOUNTER — Encounter: Payer: Self-pay | Admitting: Radiology

## 2023-11-26 ENCOUNTER — Ambulatory Visit

## 2023-11-27 ENCOUNTER — Telehealth (INDEPENDENT_AMBULATORY_CARE_PROVIDER_SITE_OTHER): Payer: PRIVATE HEALTH INSURANCE | Admitting: Adult Health

## 2023-11-27 ENCOUNTER — Encounter: Payer: Self-pay | Admitting: Adult Health

## 2023-11-27 DIAGNOSIS — F410 Panic disorder [episodic paroxysmal anxiety] without agoraphobia: Secondary | ICD-10-CM | POA: Diagnosis not present

## 2023-11-27 DIAGNOSIS — F331 Major depressive disorder, recurrent, moderate: Secondary | ICD-10-CM | POA: Diagnosis not present

## 2023-11-27 DIAGNOSIS — F411 Generalized anxiety disorder: Secondary | ICD-10-CM | POA: Diagnosis not present

## 2023-11-27 MED ORDER — ESCITALOPRAM OXALATE 10 MG PO TABS: ORAL_TABLET | ORAL | 0 refills | Status: DC

## 2023-11-27 MED ORDER — ALPRAZOLAM 1 MG PO TABS: ORAL_TABLET | ORAL | 2 refills | Status: DC

## 2023-11-27 NOTE — Progress Notes (Signed)
 Chase Lee [id number] 04/24/1980 43 y.o.  Virtual Visit via Video Note  I connected with pt @ on 11/27/23 at 12:00 PM EDT by a video enabled telemedicine application and verified that I am speaking with the correct person using two identifiers.   I discussed the limitations of evaluation and management by telemedicine and the availability of in person appointments. The patient expressed understanding and agreed to proceed.  I discussed the assessment and treatment plan with the patient. The patient was provided an opportunity to ask questions and all were answered. The patient agreed with the plan and demonstrated an understanding of the instructions.   The patient was advised to call back or seek an in-person evaluation if the symptoms worsen or if the condition fails to improve as anticipated.  I provided 20 minutes of non-face-to-face time during this encounter.  The patient was located at home.  The provider was located at [facility].   Angeline LOISE Sayers, NP   Subjective:   Patient ID:  Chase Lee is a 43 y.o. (DOB May 22, 1980) male.  Chief Complaint: No chief complaint on file.   HPI Chase Lee presents for follow-up of MDD, panic attacks and GAD.   Describes mood today as ok. Pleasant. Denies tearfulness. Mood symptoms - reports decreased depression it's better. Reports decreased anxiety - able to talk myself down from it. Reports panic attacks - not as many. Reports irritability at times. Reports improved interest and motivation. Reports some worry, rumination and over thinking - situational. Denies obsessional thoughts or acts. Reports mood has improved. Stating I feel like I'm doing ok. Feels like medications are helpful. Taking medications as prescribed.  Energy levels improved. Active, walking at work. Enjoys some usual interests and activities. Married. Lives with wife. Family in [place]. Spending time with family. Appetite  adequate. Reports weight gain - 218 - 225 pounds. Reports sleeping well most nights. Averages 7 hours. Reports focus and concentration has improved. Completing tasks. Managing aspects of household. 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 Denies SI or HI.  Denies AH or VH. Denies self harm. Denies substance use.   Previous medications: Prozac, Zoloft   Review of Systems:  Review of Systems  Musculoskeletal:  Negative for gait problem.  Neurological:  Negative for tremors.  Psychiatric/Behavioral:         Please refer to HPI    Medications: I have reviewed the patient's current medications.  Current Outpatient Medications  Medication Sig Dispense Refill   acetaminophen  (TYLENOL ) 500 MG tablet Take 1,000 mg by mouth every 6 (six) hours as needed for mild pain (pain score 1-3).     ALPRAZolam  (XANAX ) 1 MG tablet TAKE 1 TABLET(1 MG) BY MOUTH THREE TIMES DAILY AS NEEDED FOR ANXIETY 90 tablet 2   ascorbic acid (VITAMIN C ) 500 MG tablet Take 1 tablet (500 mg total) by mouth daily with breakfast. 30 tablet 1   aspirin  EC 81 MG tablet Take 1 tablet (81 mg total) by mouth daily. Swallow whole. 30 tablet 12   Buprenorphine HCl-Naloxone HCl 4-1 MG FILM Place under the tongue 3 (three) times daily.     escitalopram  (LEXAPRO ) 10 MG tablet TAKE 1 TABLET(10 MG) BY MOUTH DAILY 90 tablet 0   furosemide  (LASIX ) 40 MG tablet Take 1 tablet (40 mg total) by mouth daily. 30 tablet 1   lisinopril  (ZESTRIL ) 40 MG tablet Take 1 tablet (40 mg total) by mouth daily.  90 tablet 0   metoprolol  tartrate (LOPRESSOR ) 25 MG tablet Take 1 tablet by mouth twice daily 180 tablet 1   omeprazole  (PRILOSEC) 40 MG capsule Take 1 capsule (40 mg total) by mouth 2 (two) times daily. 60 capsule 1   warfarin (COUMADIN ) 2 MG tablet Take 1 tablet (2 mg total) by mouth daily. 30 tablet 5   No current facility-administered medications for this visit.    Medication Side Effects:  None  Allergies: No Known Allergies  Past Medical History:  Diagnosis Date   Anxiety    History of artificial heart valve    a. s/p aortic valvuloplasty at age 21 b. subsequent mechanical AVR at 43 yo c. 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   HTN (hypertension) 04/05/2023    Family History  Problem Relation Age of Onset   Depression Mother    Cancer Mother    Cancer Father    Colon cancer Neg Hx    Colon polyps Neg Hx     Social History   Socioeconomic History   Marital status: Married    Spouse name: Not on file   Number of children: 0   Years of education: Not on file   Highest education level: Some college, no degree  Occupational History   Occupation: Airline pilot    Comment: Works for [employer]  Tobacco Use   Smoking status: Former    Current packs/day: 0.50    Average packs/day: 0.5 packs/day for 0.6 years (0.3 ttl pk-yrs)    Types: Cigarettes    Start date: 04/06/2023    Quit date: 08/10/2019    Passive exposure: Never   Smokeless tobacco: Never   Tobacco comments:    Tries to chew nicotine gum at work - smoking 3 ciggs per day   Vaping Use   Vaping status: Never Used  Substance and Sexual Activity   Alcohol use: Never   Drug use: Never   Sexual activity: Yes    Birth control/protection: None  Other Topics Concern   Not on file  Social History Narrative   Not on file   Social Drivers of Health   Financial Resource Strain: Medium Risk (06/19/2023)   Overall Financial Resource Strain (CARDIA)    Difficulty of Paying Living Expenses: Somewhat hard  Food Insecurity: No Food Insecurity (06/19/2023)   Hunger Vital Sign    Worried About Running Out of Food in the Last Year: Never true    Ran Out of Food in the Last Year: Never true  Transportation Needs: No Transportation Needs (06/19/2023)   PRAPARE - Administrator, Civil Service (Medical): No    Lack of  Transportation (Non-Medical): No  Physical Activity: Not on file  Stress: Not on file  Social Connections: Socially Integrated (04/20/2023)   Social Connection and Isolation Panel    Frequency of Communication with Friends and Family: More than three times a week    Frequency of Social Gatherings with Friends and Family: More than three times a week    Attends Religious Services: 1 to 4 times per year    Active Member of [employer] or Organizations: Yes    Attends Banker Meetings: 1 to 4 times per year    Marital Status: Married  Catering manager Violence: Not At Risk (06/18/2023)   Humiliation, Afraid, Rape, and Kick questionnaire    Fear of Current or Ex-Partner: No  Emotionally Abused: No    Physically Abused: No    Sexually Abused: No    Past Medical History, Surgical history, Social history, and Family history were reviewed and updated as appropriate.   Please see review of systems for further details on the patient's review from today.   Objective:   Physical Exam:  There were no vitals taken for this visit.  Physical Exam Constitutional:      General: He is not in acute distress. Musculoskeletal:        General: No deformity.  Neurological:     Mental Status: He is alert and oriented to person, place, and time.     Coordination: Coordination normal.  Psychiatric:        Attention and Perception: Attention and perception normal. He does not perceive auditory or visual hallucinations.        Mood and Affect: Mood normal. Mood is not anxious or depressed. Affect is not labile, blunt, angry or inappropriate.        Speech: Speech normal.        Behavior: Behavior normal.        Thought Content: Thought content normal. Thought content is not paranoid or delusional. Thought content does not include homicidal or suicidal ideation. Thought content does not include homicidal or suicidal plan.        Cognition and Memory: Cognition and memory normal.        Judgment:  Judgment normal.     Comments: Insight intact     Lab Review:     Component Value Date/Time   NA 137 06/19/2023 0431   K 3.8 06/19/2023 0431   CL 101 06/19/2023 0431   CO2 28 06/19/2023 0431   GLUCOSE 120 (H) 06/19/2023 0431   BUN 21 (H) 06/19/2023 0431   CREATININE 1.18 06/19/2023 0431   CALCIUM  8.9 06/19/2023 0431   PROT 6.8 06/18/2023 0805   ALBUMIN  3.7 06/18/2023 0805   AST 25 06/18/2023 0805   ALT 29 06/18/2023 0805   ALKPHOS 94 06/18/2023 0805   BILITOT 0.5 06/18/2023 0805   GFRNONAA >60 06/19/2023 0431   GFRAA >60 08/12/2019 0734       Component Value Date/Time   WBC 6.6 06/19/2023 0431   RBC 3.74 (L) 06/19/2023 0431   HGB 8.9 (L) 06/19/2023 0431   HCT 29.7 (L) 06/19/2023 0431   PLT 278 06/19/2023 0431   MCV 79.4 (L) 06/19/2023 0431   MCH 23.8 (L) 06/19/2023 0431   MCHC 30.0 06/19/2023 0431   RDW 16.4 (H) 06/19/2023 0431   LYMPHSABS 1.0 06/18/2023 0805   MONOABS 0.5 06/18/2023 0805   EOSABS 0.1 06/18/2023 0805   BASOSABS 0.0 06/18/2023 0805    No results found for: POCLITH, LITHIUM   No results found for: PHENYTOIN, PHENOBARB, VALPROATE, CBMZ   .res Assessment: Plan:    Plan:  Lexapro  10mg  at hs Xanax  1mg  TID  RTC 3 months  20 minutes spent dedicated to the care of this patient on the date of this encounter to include pre-visit review of records, ordering of medication, post visit documentation, and face-to-face time with the patient discussing MDD, panic attacks and GAD. Discussed continuing current medication regimen.  Patient advised to contact office with any questions, adverse effects, or acute worsening in signs and symptoms.  Discussed potential benefits, risk, and side effects of benzodiazepines to include potential risk of tolerance and dependence, as well as possible drowsiness.  Advised patient not to drive if experiencing drowsiness and to take  lowest possible effective dose to minimize risk of dependence and  tolerance.  There are no diagnoses linked to this encounter.   Please see After Visit Summary for patient specific instructions.  Future Appointments  Date Time Provider Department Center  11/27/2023 12:00 PM Obert Espindola Nattalie, NP [facility] None  12/03/2023 10:45 AM [facility]  [facility]    No orders of the defined types were placed in this encounter.     -------------------------------

## 2023-12-03 ENCOUNTER — Encounter

## 2023-12-19 ENCOUNTER — Ambulatory Visit: Attending: Internal Medicine | Admitting: *Deleted

## 2023-12-19 DIAGNOSIS — Z5181 Encounter for therapeutic drug level monitoring: Secondary | ICD-10-CM

## 2023-12-19 DIAGNOSIS — Z952 Presence of prosthetic heart valve: Secondary | ICD-10-CM | POA: Diagnosis not present

## 2023-12-19 LAB — POCT INR: INR: 1.4 — AB (ref 2.0–3.0)

## 2023-12-19 MED ORDER — WARFARIN SODIUM 2 MG PO TABS: 2.0000 mg | ORAL_TABLET | Freq: Every day | ORAL | 5 refills | Status: DC

## 2023-12-19 NOTE — Progress Notes (Signed)
 INR 1.4. Please see anticoagulation encounter

## 2023-12-19 NOTE — Patient Instructions (Signed)
 Use warfarin 2mg  tablet Take warfarin 1 1/2 tablets tonight and tomorrow night then resume 1 tablet daily Recheck in 3 wks

## 2024-01-02 ENCOUNTER — Encounter (INDEPENDENT_AMBULATORY_CARE_PROVIDER_SITE_OTHER): Payer: Self-pay | Admitting: Gastroenterology

## 2024-01-09 ENCOUNTER — Encounter

## 2024-01-14 ENCOUNTER — Telehealth: Payer: Self-pay | Admitting: Internal Medicine

## 2024-01-14 ENCOUNTER — Ambulatory Visit: Attending: Internal Medicine | Admitting: *Deleted

## 2024-01-14 DIAGNOSIS — Z5181 Encounter for therapeutic drug level monitoring: Secondary | ICD-10-CM | POA: Diagnosis not present

## 2024-01-14 DIAGNOSIS — Z952 Presence of prosthetic heart valve: Secondary | ICD-10-CM | POA: Diagnosis not present

## 2024-01-14 LAB — POCT INR: INR: 1.4 — AB (ref 2.0–3.0)

## 2024-01-14 NOTE — Patient Instructions (Signed)
 Use warfarin 2mg  tablet Take warfarin 2 tablets tonight then increase dose to 1 tablet daily except 1 1/2 tablets on Tuesdays, Thursdays and Saturdays Recheck in 2 wks

## 2024-01-14 NOTE — Telephone Encounter (Signed)
 Pt c/o Shortness Of Breath: STAT if SOB developed within the last 24 hours or pt is noticeably SOB on the phone  1. Are you currently SOB (can you hear that pt is SOB on the phone)? Yes  2. How long have you been experiencing SOB? 2-3 months but increasingly getting worse.  3. Are you SOB when sitting or when up moving around? Moving around.  4. Are you currently experiencing any other symptoms? Yes  Patient is due for follow up now and is on the waiting list but wanted to talk to a nurse today.

## 2024-01-14 NOTE — Progress Notes (Signed)
 INR 1.4. Please see anticoagulation encounter

## 2024-01-14 NOTE — Telephone Encounter (Signed)
 Reports SOB and dizziness with activity for the past 2 months. Reports swelling in legs and feet for the past 2 months. Denies change in diet. Denies chest pain. Reports palpitations started within the past 2 weeks but have not been bad. Offered appointment to see general cardiology Mallipeddi tomorrow at 10 am but declined initially due to his job. Request to see Dr. Nancey and advised that the symptom described need to be evaluated by general cardiology. Agreed to seeing Mallipeddi tomorrow at 10 am and said if he is need to reschedule, he would do so. Advised if he develops worsening symptoms, he needed to be evaluated in the ED. Verbalized understanding of plan.

## 2024-01-15 ENCOUNTER — Encounter: Payer: Self-pay | Admitting: Internal Medicine

## 2024-01-15 ENCOUNTER — Other Ambulatory Visit (HOSPITAL_COMMUNITY)
Admission: RE | Admit: 2024-01-15 | Discharge: 2024-01-15 | Disposition: A | Discharge status: Home / Self Care | Source: Ambulatory Visit | Attending: Internal Medicine | Admitting: Internal Medicine

## 2024-01-15 ENCOUNTER — Ambulatory Visit: Attending: Internal Medicine | Admitting: Internal Medicine

## 2024-01-15 ENCOUNTER — Encounter: Payer: Self-pay | Admitting: *Deleted

## 2024-01-15 ENCOUNTER — Ambulatory Visit

## 2024-01-15 VITALS — BP 156/76 | HR 47 | Ht 66.0 in | Wt 241.0 lb

## 2024-01-15 DIAGNOSIS — I5033 Acute on chronic diastolic (congestive) heart failure: Secondary | ICD-10-CM | POA: Diagnosis not present

## 2024-01-15 DIAGNOSIS — I480 Paroxysmal atrial fibrillation: Secondary | ICD-10-CM

## 2024-01-15 DIAGNOSIS — I5032 Chronic diastolic (congestive) heart failure: Secondary | ICD-10-CM | POA: Diagnosis present

## 2024-01-15 DIAGNOSIS — R42 Dizziness and giddiness: Secondary | ICD-10-CM | POA: Insufficient documentation

## 2024-01-15 DIAGNOSIS — R0602 Shortness of breath: Secondary | ICD-10-CM | POA: Insufficient documentation

## 2024-01-15 LAB — BASIC METABOLIC PANEL WITH GFR
Anion gap: 10 (ref 5–15)
BUN: 21 mg/dL — ABNORMAL HIGH (ref 6–20)
CO2: 28 mmol/L (ref 22–32)
Calcium: 8.8 mg/dL — ABNORMAL LOW (ref 8.9–10.3)
Chloride: 104 mmol/L (ref 98–111)
Creatinine, Ser: 1.13 mg/dL (ref 0.61–1.24)
GFR, Estimated: >60 mL/min (ref >60)
Glucose, Bld: 90 mg/dL (ref 70–99)
Potassium: 4.5 mmol/L (ref 3.5–5.1)
Sodium: 142 mmol/L (ref 135–145)

## 2024-01-15 LAB — PRO BRAIN NATRIURETIC PEPTIDE: Pro Brain Natriuretic Peptide: 1098 pg/mL — ABNORMAL HIGH (ref <300.0)

## 2024-01-15 LAB — CBC
HCT: 34.7 % — ABNORMAL LOW (ref 39.0–52.0)
Hemoglobin: 10.6 g/dL — ABNORMAL LOW (ref 13.0–17.0)
MCH: 25.4 pg — ABNORMAL LOW (ref 26.0–34.0)
MCHC: 30.5 g/dL (ref 30.0–36.0)
MCV: 83.2 fL (ref 80.0–100.0)
Platelets: 234 K/uL (ref 150–400)
RBC: 4.17 MIL/uL — ABNORMAL LOW (ref 4.22–5.81)
RDW: 16.4 % — ABNORMAL HIGH (ref 11.5–15.5)
WBC: 6.3 K/uL (ref 4.0–10.5)
nRBC: 0 % (ref 0.0–0.2)

## 2024-01-15 MED ORDER — EMPAGLIFLOZIN 10 MG PO TABS: 10.0000 mg | ORAL_TABLET | Freq: Every day | ORAL | 3 refills | Status: AC

## 2024-01-15 MED ORDER — FUROSEMIDE 40 MG PO TABS: ORAL_TABLET | ORAL | 11 refills | Status: DC

## 2024-01-15 NOTE — Patient Instructions (Addendum)
 Medication Instructions:   Stop Taking Metoprolol    Jardiance 10 mg Daily   Increase Lasix  to 80 mg two times daily for one week then decrease to 40 mg two times daily.   *If you need a refill on your cardiac medications before your next appointment, please call your pharmacy*  Lab Work: Your physician recommends that you return for lab work today. ( BNP)   Your physician recommends that you return for lab work in: 5 Days ( BMP)   Please have this done at [facility]. (hours-Monday through Friday from 8:00 am to 4:00 pm except 11:30 am to 12:10 pm)    If you have labs (blood work) drawn today and your tests are completely normal, you will receive your results only by: MyChart Message (if you have MyChart) OR A paper copy in the mail If you have any lab test that is abnormal or we need to change your treatment, we will call you to review the results.  Testing/Procedures: ZIO XT- Long Term Monitor Instructions   Your physician has requested you wear your ZIO patch monitor___14____days.   This is a single patch monitor.  [employer] supplies one patch monitor per enrollment.  Additional stickers are not available.   Please do not apply patch if you will be having a Nuclear Stress Test, Echocardiogram, Cardiac CT, MRI, or Chest Xray during the time frame you would be wearing the monitor. The patch cannot be worn during these tests.  You cannot remove and re-apply the ZIO XT patch monitor.    Do not shower for the first 24 hours.  You may shower after the first 24 hours.   Press button if you feel a symptom. You will hear a small click.  Record Date, Time and Symptom in the Patient Log Book.   When you are ready to remove patch, follow instructions on last 2 pages of Patient Log Book.  Stick patch monitor onto last page of Patient Log Book.   Place Patient Log Book in [place] box.  Use locking tab on box and tape box closed securely.  The [employer] has [employer] on it.   Please place in mailbox as soon as possible.  Your physician should have your test results approximately 7 days after the monitor has been mailed back to [facility].   Call [facility] Customer Care at [phone] if you have questions regarding your ZIO XT patch monitor.  Call them immediately if you see an orange light blinking on your monitor.   If your monitor falls off in less than 4 days contact our Monitor department at [phone].  If your monitor becomes loose or falls off after 4 days call [employer] at [phone] for suggestions on securing your monitor.    Follow-Up: At [facility], you and your health needs are our priority.  As part of our continuing mission to provide you with exceptional heart care, our providers are all part of one team.  This team includes your primary Cardiologist (physician) and Advanced Practice Providers or APPs (Physician Assistants and Nurse Practitioners) who all work together to provide you with the care you need, when you need it.  Your next appointment:   4 week(s)  Provider:   You may see Vishnu P Mallipeddi, MD or one of the following Advanced Practice Providers on your designated Care Team:   Brittany Strader, PA-C  [place], PA-C Olivia Pavy, [place]     We recommend signing up for  the patient portal called MyChart.  Sign up information is provided on this After Visit Summary.  MyChart is used to connect with patients for Virtual Visits (Telemedicine).  Patients are able to view lab/test results, encounter notes, upcoming appointments, etc.  Non-urgent messages can be sent to your provider as well.   To learn more about what you can do with MyChart, go to [email or web address].   Other Instructions Thank you for choosing [facility]!

## 2024-01-15 NOTE — Progress Notes (Signed)
 Cardiology Office Note  Date: 01/15/2024   ID: Chase Lee, DOB 1980/11/12, MRN [id number]  PCP:  [facility]  Cardiologist:  Diannah SHAUNNA Maywood, MD Electrophysiologist:  Eulas FORBES Furbish, MD    History of Present Illness: Chase Lee is a 43 y.o. 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  Patient is born and raised in [place]  where he underwent aortic valvuloplasty when he was 43 years old and later underwent Saint Jude mechanical aortic valve replacement when he was 43 years old.  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  He is currently on Coumadin  2 mg once daily. aspirin  81 mg once daily was discontinued. He developed postop atrial fibrillation for which he was started on p.o. amiodarone  that was later discontinued by EP.  He initially had issues with rate control but eventually converted to NSR. He had hospitalization at [facility] on 06/18/2023 for acute diastolic heart failure exacerbation likely secondary to acute blood loss anemia (hemoglobin dropped from 11 to 8) with no reports of melena or hematochezia.  He was supposed to follow-up with GI in the outpatient setting and get EGD/colonoscopy.  I do not see any note from GI.   Patient is here today for follow-up visit, mainly due to worsening DOE and dizziness.  I reviewed his weights.  He weighed 228 pounds in June 2025.  Today, he is 241 pounds.  He noticed worsening DOE and bilateral leg swelling in the last couple of months.  Currently on p.o. Lasix  40 mg once daily.  He also has been feeling dizzy in the last couple of months.  I reviewed his labs.  CBC from March 2025 revealed hemoglobin dropped to 8 ish.  Unclear if it has dropped even further due to new symptoms of dizziness, worsening DOE and bilateral leg swelling.  Past Medical History:  Diagnosis Date   Anxiety    History of artificial heart valve    a. s/p aortic valvuloplasty at age 1 b. subsequent mechanical AVR at 43 yo c. 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   HTN (hypertension) 04/05/2023    Past Surgical History:  Procedure Laterality Date   BENTALL PROCEDURE N/A 04/26/2023   Procedure: BENTALL PROCEDURE USING ON-X ASCENDING AORTIC PROSTHESIS SIZE ;  Surgeon: Lucas Dorise POUR, MD;  Location: [facility] OR;  Service: Open Heart Surgery;  Laterality: N/A;  CIRC ARREST   CARDIAC SURGERY     REDO STERNOTOMY N/A 04/26/2023   Procedure: REDO STERNOTOMY;  Surgeon: Lucas Dorise POUR, MD;  Location: [facility] OR;  Service: Open Heart Surgery;  Laterality: N/A;   REPLACEMENT ASCENDING AORTA N/A 04/26/2023   Procedure: REPLACEMENT ASCENDING AORTIC ANEURYSM USING HEMASHIELD PLATINUM GRAFT SIZE ;  Surgeon:  Lucas Dorise POUR, MD;  Location: [facility] OR;  Service: Open Heart Surgery;  Laterality: N/A;   TEE WITHOUT CARDIOVERSION N/A 04/26/2023   Procedure: TRANSESOPHAGEAL ECHOCARDIOGRAM (TEE);  Surgeon: Lucas Dorise POUR, MD;  Location: [facility] OR;  Service: Open Heart Surgery;  Laterality: N/A;    Current Outpatient Medications  Medication Sig Dispense Refill   acetaminophen  (TYLENOL ) 500 MG tablet Take 1,000 mg by mouth every 6 (six) hours as needed for  mild pain (pain score 1-3).     ALPRAZolam  (XANAX ) 1 MG tablet TAKE 1 TABLET(1 MG) BY MOUTH THREE TIMES DAILY AS NEEDED FOR ANXIETY 90 tablet 2   ascorbic acid (VITAMIN C ) 500 MG tablet Take 1 tablet (500 mg total) by mouth daily with breakfast. 30 tablet 1   aspirin  EC 81 MG tablet Take 1 tablet (81 mg total) by mouth daily. Swallow whole. 30 tablet 12   Buprenorphine HCl-Naloxone HCl 4-1 MG FILM Place under the tongue 3 (three) times daily.     escitalopram  (LEXAPRO ) 10 MG tablet TAKE 1 TABLET(10 MG) BY MOUTH DAILY 90 tablet 0   furosemide  (LASIX ) 40 MG tablet Take 1 tablet (40 mg total) by mouth daily. 30 tablet 1   lisinopril  (ZESTRIL ) 40 MG tablet Take 1 tablet (40 mg total) by mouth daily. 90 tablet 0   metoprolol  tartrate (LOPRESSOR ) 25 MG tablet Take 1 tablet by mouth twice daily 180 tablet 1   omeprazole  (PRILOSEC) 40 MG capsule Take 1 capsule (40 mg total) by mouth 2 (two) times daily. 60 capsule 1   warfarin (COUMADIN ) 2 MG tablet Take 1 tablet (2 mg total) by mouth daily. 30 tablet 5   No current facility-administered medications for this visit.   Allergies:  Patient has no known allergies.   Social History: The patient  reports that he quit smoking about 4 years ago. His smoking use included cigarettes. He started smoking about 9 months ago. He has a 0.4 pack-year smoking history. He has never been exposed to tobacco smoke. He has never used smokeless tobacco. He reports that he does not drink alcohol and does not use drugs.   Family History: The patient's family history includes Cancer in his father and mother; Depression in his mother.   ROS:  Please see the history of present illness. Otherwise, complete review of systems is positive for none  All other systems are reviewed and negative.   Physical Exam: VS:  There were no vitals taken for this visit., BMI There is no height or weight on file to calculate BMI.  Wt Readings from Last 3 Encounters:  10/17/23 232 lb (105.2 kg)   09/11/23 228 lb (103.4 kg)  06/25/23 226 lb (102.5 kg)    General: Patient appears comfortable at rest. HEENT: Conjunctiva and lids normal, oropharynx clear with moist mucosa. Neck: Supple, no elevated JVP or carotid bruits, no thyromegaly. Lungs: Clear to auscultation, nonlabored breathing at rest. Cardiac: Regular rate and rhythm, no S3 or significant systolic murmur, no pericardial rub.  Mechanical click present. Abdomen: Soft, nontender, no hepatomegaly, bowel sounds present, no guarding or rebound. Extremities: No pitting edema, distal pulses 2+. Skin: Warm and dry. Musculoskeletal: No kyphosis. Neuropsychiatric: Alert and oriented x3, affect grossly appropriate.  Recent Labwork: 06/18/2023: ALT 29; AST 25 06/19/2023: B Natriuretic Peptide 344.0; BUN 21; Creatinine, Ser 1.18; Hemoglobin 8.9; Magnesium  2.1; Platelets 278; Potassium 3.8; Sodium 137     Component Value Date/Time   CHOL 171 06/19/2023 0431   TRIG 93 06/19/2023 0431  HDL 41 06/19/2023 0431   CHOLHDL 4.2 06/19/2023 0431   VLDL 19 06/19/2023 0431   LDLCALC 111 (H) 06/19/2023 0431     Assessment and Plan:  Acute on chronic diastolic heart failure - Dry weight is around 225 pounds.  Gained 16 pounds since June 2024. - Symptomatic with worsening DOE and bilateral 2-3+ pitting edema in the legs in the last couple of months. - Previously, he was admitted to [facility] with similar presentation in the setting of severe anemia (hemoglobin dropped to 8-ish).  No recent CBC on file.  Obtain CBC. - Currently on p.o. Lasix  40 mg once daily.  Increase p.o. Lasix  from 40 mg to 80 mg twice daily for 1 week followed by 40 mg twice daily thereafter.  Start Jardiance 10 mg once daily.  Discussed side effects.  Obtain BMP in 5 days. - If he does not notice any increased urination in the next 2 to 3 days, he will need to reach out to our clinic and get admitted for IV diuresis. - Obtain limited echocardiogram.  Dizziness - EKG today  showed severe sinus bradycardia with incomplete LBBB.  HR 47 bpm.  He is taking metoprolol  tartrate 25 mg twice daily.  Will discontinue and obtain 2-week event monitor, live to rule out any arrhythmia/conduction abnormalities. - In addition, he is currently on Coumadin  and I suspect if there is any hemoglobin drop compared to March 2025 values. Hb was 8 ish at that time (baseline was around 11).  Will obtain CBC.  Paroxysmal atrial fibrillation - New onset in the postop setting. He was started on amiodarone  200 mg once daily that was later discontinued by electrophysiology. On metoprolol  25 mg twice daily but due to severe sinus bradycardia and dizziness, will discontinue. - Continue Coumadin  2 mg once daily, follows up with Coumadin  clinic. - Goal INR between 2 and 3.  Bicuspid aortic valve - Normal aortic prosthesis in March 2025.  Normal LVEF but has G2 DD. - Due to new ADHF symptoms, will obtain limited echocardiogram. 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  - Echocardiogram in March 2025 showed normal LVEF, G2 DD, severe dilatation of LA, trivial AI and normal aortic prosthesis. - Continue Coumadin  2 mg once daily, follows with the Coumadin  clinic.  Aspirin  discontinued. - Goal INR is between 1.5 and 2 for On-X aortic prosthesis.    Will provide excuse note from work today.  Complex decision making.  50 minutes spent in reviewing prior medical records, specialist notes, more than 3 labs, discussion of the above problems with the patient and documentation.   Medication  Adjustments/Labs and Tests Ordered: Current medicines are reviewed at length with the patient today.  Concerns regarding medicines are outlined above.    Disposition:  Follow up 2 weeks  Signed Chrisanna Mishra Priya Bentley Fissel, MD, 01/15/2024 10:08 AM    [facility] [address] [place], [place], [place] [address]

## 2024-01-17 ENCOUNTER — Ambulatory Visit: Payer: Self-pay | Admitting: Internal Medicine

## 2024-01-21 ENCOUNTER — Encounter: Payer: Self-pay | Admitting: Radiology

## 2024-01-25 ENCOUNTER — Encounter: Payer: Self-pay | Admitting: Cardiovascular Disease

## 2024-01-25 ENCOUNTER — Telehealth: Payer: Self-pay | Admitting: Internal Medicine

## 2024-01-25 ENCOUNTER — Ambulatory Visit: Attending: Cardiovascular Disease | Admitting: Cardiovascular Disease

## 2024-01-25 VITALS — BP 130/78 | HR 61 | Ht 66.0 in | Wt 232.0 lb

## 2024-01-25 DIAGNOSIS — I480 Paroxysmal atrial fibrillation: Secondary | ICD-10-CM | POA: Diagnosis not present

## 2024-01-25 NOTE — Progress Notes (Signed)
 Electrophysiology Office Note:    Date:  01/25/2024   ID:  Chase Lee, DOB 08/04/1980, MRN [id number]  PCP:  [facility]   [facility] Providers Cardiologist:  Vishnu P Mallipeddi, MD Electrophysiologist:  Eulas FORBES Furbish, MD     Referring MD: [facility]   History of Present Illness:    Chase Lee is a 43 y.o. male with a medical history significant for bicuspid aortic valve, referred for management of atrial fibrillation.      Discussed the use of AI scribe software for clinical note transcription with the patient, who gave verbal consent to proceed.  History of Present Illness Chase Lee is a 43 year old male with a history of aortic valve surgeries who presents with atrial fibrillation. He was referred by Almarie Crate for evaluation of atrial fibrillation.  He has a history of aortic valve surgeries at ages 71, 65, and most recently at 61. His atrial fibrillation was first noted during his recent heart surgery in February 2025. He experienced palpitations and shortness of breath, which were new symptoms for him.  Postoperatively, he was placed on a heart monitor which showed a low burden of AF. He was started on amiodarone  during his hospital stay, which has been effective in managing his symptoms. He describes the medication as something he 'swears by' due to its effectiveness. He is currently on amiodarone  and warfarin.  He has experienced palpitations that felt like his heart was 'getting stuck' and then returning to normal rhythm. He has a history of anxiety for which he takes medication, and the palpitations have been distressing for him.  Metoprolol  was discontinued due to significant sinus bradycardia with dizziness.  Amiodarone  was discontinued in June 2025.  Since, he has not had recurrence of atrial fibrillation symptoms.  He is able to palpate forceful beats over his manubrium very easily, this  phenomenon is appreciable on exam and is attributable to the mechanical aortic valve.         Today, he reports that he's doing well.  EKGs/Labs/Other Studies Reviewed Today:     Echocardiogram:  TTE March 2025 LVEF 60 to 65%.  Grade 2 diastolic dysfunction.  Mildly elevated pulmonary artery systolic pressure.  Left atrium severely dilated.  Right atrium mild to moderately dilated.  Aortic valve replaced.   Monitors:  14 day monitor February 2025-- my interpretation Sinus rhythm heart rate 31 to 90, average 59 bpm 9% burden of atrial fibrillation, average rate 87 bpm, longest 1 day 7 hours    EKG:   EKG Interpretation Date/Time:  Friday January 25 2024 11:38:37 EST Ventricular Rate:  61 PR Interval:  174 QRS Duration:  178 QT Interval:  498 QTC Calculation: 501 R Axis:   63  Text Interpretation: Normal sinus rhythm Left bundle branch block When compared with ECG of 15-Jan-2024 10:16, QRS axis Shifted left T wave inversion now evident in Lateral leads Confirmed by Furbish Eulas [phone]) on 01/25/2024 11:39:29 AM     Physical Exam:    VS:  BP 130/78 (BP Location: Left Arm)   Pulse 61   Ht 5' 6 (1.676 m)   Wt 232 lb (105.2 kg)   SpO2 96%   BMI 37.45 kg/m     Wt Readings from Last 3 Encounters:  01/25/24 232 lb (105.2 kg)  01/15/24 241 lb (109.3 kg)  10/17/23 232 lb (105.2 kg)     GEN:  Well nourished, well developed in no acute  distress CARDIAC: RRR, no murmurs, rubs, gallops RESPIRATORY:  Normal work of breathing MUSCULOSKELETAL: no edema    ASSESSMENT & PLAN:     Atrial fibrillation Occurred in the postoperative period after aortic valve surgery Resolved with time, on amiodarone  No recurrence since amiodarone  was discontinued 08/2023 He is on warfarin due to the mechanical valve  Aortic valve replacement Replacement a of aortic arch and mechanical valve replacement with an On-X mechanical valve 04/2023 On warfarin for anticoagulation   At  this point, I will release him from AF clinic. If he has recurrence, I will be happy to see him again.    Signed, Eulas FORBES Furbish, MD  01/25/2024 11:40 AM    [facility]

## 2024-01-25 NOTE — Telephone Encounter (Signed)
 Chase Lee was asking if he really needs to keep appt with Brittany and then in a few weeks with Dr. Mallipeddi. He said its very hard to continue getting time off from work.

## 2024-01-25 NOTE — Patient Instructions (Addendum)

## 2024-01-25 NOTE — Telephone Encounter (Signed)
 Patient informed and verbalized understanding of plan and agrees to keeping visit with Mallipeddi in December. Will cancel 01/30/2024 visit with Strader.

## 2024-01-28 ENCOUNTER — Ambulatory Visit

## 2024-01-28 NOTE — Telephone Encounter (Signed)
 HF precautions provided via mychart.

## 2024-01-29 ENCOUNTER — Ambulatory Visit: Admitting: *Deleted

## 2024-01-29 ENCOUNTER — Ambulatory Visit: Attending: Internal Medicine

## 2024-01-29 DIAGNOSIS — R0602 Shortness of breath: Secondary | ICD-10-CM

## 2024-01-29 DIAGNOSIS — Z5181 Encounter for therapeutic drug level monitoring: Secondary | ICD-10-CM

## 2024-01-29 DIAGNOSIS — Z952 Presence of prosthetic heart valve: Secondary | ICD-10-CM

## 2024-01-29 LAB — POCT INR: INR: 2.1 (ref 2.0–3.0)

## 2024-01-29 MED ORDER — PERFLUTREN LIPID MICROSPHERE
1.0000 mL | INTRAVENOUS | Status: AC | PRN
  Administered 2024-01-29: 3 mL via INTRAVENOUS

## 2024-01-29 NOTE — Patient Instructions (Signed)
 Use warfarin 2mg  tablet Continue warfarin 1 tablet daily except 1 1/2 tablets on Tuesdays, Thursdays and Saturdays Recheck in 4 wks

## 2024-01-30 ENCOUNTER — Ambulatory Visit: Admitting: Student

## 2024-01-30 LAB — ECHOCARDIOGRAM LIMITED
AR max vel: 3.69 cm2
AV Area VTI: 3.35 cm2
AV Area mean vel: 3.66 cm2
AV Mean grad: 16 mmHg
AV Peak grad: 25 mmHg
Ao pk vel: 2.5 m/s
Area-P 1/2: 3.3 cm2
Calc EF: 56.7 %
MV VTI: 6.41 cm2
S' Lateral: 3.7 cm
Single Plane A2C EF: 35.7 %
Single Plane A4C EF: 67.4 %

## 2024-02-04 NOTE — Progress Notes (Signed)
 INR 2.1; Please see anticoagulation encounter

## 2024-02-19 ENCOUNTER — Telehealth: Payer: Self-pay | Admitting: Adult Health

## 2024-02-19 NOTE — Telephone Encounter (Signed)
 LF 11/6, due 12/4. Updated pharmacy profile.

## 2024-02-19 NOTE — Telephone Encounter (Signed)
 Chase Lee called and scheduled  an appt 12/9. He needs a refill on his xanax . He would like to change his pharmacy to the [facility] [address] in [place]. This will be his prefferred pharmacy

## 2024-02-21 ENCOUNTER — Other Ambulatory Visit: Payer: Self-pay

## 2024-02-21 DIAGNOSIS — F411 Generalized anxiety disorder: Secondary | ICD-10-CM

## 2024-02-21 DIAGNOSIS — F410 Panic disorder [episodic paroxysmal anxiety] without agoraphobia: Secondary | ICD-10-CM

## 2024-02-21 NOTE — Telephone Encounter (Signed)
 Pended

## 2024-02-22 MED ORDER — ALPRAZOLAM 1 MG PO TABS: ORAL_TABLET | ORAL | 0 refills | Status: DC

## 2024-02-25 ENCOUNTER — Ambulatory Visit: Attending: Internal Medicine | Admitting: *Deleted

## 2024-02-25 DIAGNOSIS — I480 Paroxysmal atrial fibrillation: Secondary | ICD-10-CM | POA: Diagnosis not present

## 2024-02-25 DIAGNOSIS — Z5181 Encounter for therapeutic drug level monitoring: Secondary | ICD-10-CM

## 2024-02-25 LAB — POCT INR: INR: 2.1 (ref 2.0–3.0)

## 2024-02-25 NOTE — Progress Notes (Signed)
 INR 2.1; Please see anticoagulation encounter

## 2024-02-25 NOTE — Patient Instructions (Signed)
 Use warfarin 2mg  tablet Continue warfarin 1 tablet daily except 1 1/2 tablets on Tuesdays, Thursdays and Saturdays Recheck in 4 wks

## 2024-02-26 ENCOUNTER — Telehealth: Payer: PRIVATE HEALTH INSURANCE | Admitting: Adult Health

## 2024-02-26 ENCOUNTER — Telehealth: Payer: Self-pay | Admitting: Internal Medicine

## 2024-02-26 ENCOUNTER — Encounter: Payer: Self-pay | Admitting: Adult Health

## 2024-02-26 ENCOUNTER — Encounter: Payer: Self-pay | Admitting: Internal Medicine

## 2024-02-26 ENCOUNTER — Ambulatory Visit: Attending: Internal Medicine | Admitting: Internal Medicine

## 2024-02-26 VITALS — BP 122/77 | HR 58 | Ht 66.0 in | Wt 236.0 lb

## 2024-02-26 DIAGNOSIS — F410 Panic disorder [episodic paroxysmal anxiety] without agoraphobia: Secondary | ICD-10-CM | POA: Diagnosis not present

## 2024-02-26 DIAGNOSIS — F331 Major depressive disorder, recurrent, moderate: Secondary | ICD-10-CM

## 2024-02-26 DIAGNOSIS — F411 Generalized anxiety disorder: Secondary | ICD-10-CM | POA: Diagnosis not present

## 2024-02-26 DIAGNOSIS — Z79899 Other long term (current) drug therapy: Secondary | ICD-10-CM

## 2024-02-26 MED ORDER — SPIRONOLACTONE 25 MG PO TABS: 25.0000 mg | ORAL_TABLET | Freq: Every day | ORAL | 3 refills | Status: DC

## 2024-02-26 MED ORDER — ALPRAZOLAM 1 MG PO TABS: ORAL_TABLET | ORAL | 2 refills | Status: AC

## 2024-02-26 MED ORDER — ESCITALOPRAM OXALATE 10 MG PO TABS: ORAL_TABLET | ORAL | 0 refills | Status: AC

## 2024-02-26 MED ORDER — FUROSEMIDE 40 MG PO TABS: ORAL_TABLET | ORAL | 3 refills | Status: AC

## 2024-02-26 MED ORDER — FUROSEMIDE 40 MG PO TABS: ORAL_TABLET | ORAL | 3 refills | Status: DC

## 2024-02-26 NOTE — Progress Notes (Signed)
 Cardiology Office Note  Date: 02/26/2024   ID: Chase Lee, DOB 1980/03/23, MRN [id number]  PCP:  [facility]  Cardiologist:  Chase SHAUNNA Maywood, MD Electrophysiologist:  Chase FORBES Furbish, MD    History of Present Illness: Chase Lee is a 43 y.o. 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  Patient is born and raised in [place]  where he underwent aortic valvuloplasty when he was 43 years old and later underwent Saint Jude mechanical aortic valve replacement when he was 43 years old.  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  He is currently on Coumadin  2 mg once daily. aspirin  81 mg once daily was discontinued. He developed postop atrial fibrillation for which he was started on p.o. amiodarone  that was later discontinued by EP.  He initially had issues with rate control but eventually converted to NSR. He had hospitalization at [facility] on 06/18/2023 for acute diastolic heart failure exacerbation likely secondary to acute blood loss anemia (hemoglobin dropped from 11 to 8) with no reports of melena or hematochezia.  He was supposed to follow-up with GI in the outpatient setting and get EGD/colonoscopy.  I do not see any note from GI  however repeat hemoglobin was around 10.  In October 2025, he was in ADHF, weighed 241 pounds (dry weight is around 225 pounds).  Patient is here today for follow-up visit.  Previously on p.o. Lasix  40 mg once daily after he had ADHF symptoms, p.o. Lasix  dose was increased to 80 mg twice daily for 1 week followed by 40 mg twice daily.  He took Lasix  40 mg twice daily for 2 weeks and then switched to p.o. Lasix  40 mg once daily, per instructions.  Today, his weight is 236 pounds.  He has mild leg swelling.  No DOE.  No angina.  No orthopnea/PND.  No palpitations but had pounding sensation of his heartbeat this morning.  No dizziness/syncope.  I reviewed and discussed event monitor findings with the patient today.  Past Medical History:  Diagnosis Date   Anxiety    History of artificial heart valve    a. s/p aortic valvuloplasty at age 20 b. subsequent mechanical AVR at 43 yo c. 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   HTN (hypertension) 04/05/2023    Past Surgical History:  Procedure Laterality Date   BENTALL PROCEDURE N/A 04/26/2023   Procedure: BENTALL PROCEDURE USING ON-X ASCENDING AORTIC PROSTHESIS SIZE ;  Surgeon: Lucas Dorise POUR, MD;  Location: [facility] OR;  Service: Open Heart Surgery;  Laterality: N/A;  CIRC ARREST   CARDIAC SURGERY     REDO STERNOTOMY N/A 04/26/2023   Procedure: REDO STERNOTOMY;  Surgeon: Lucas Dorise POUR, MD;  Location: [facility] OR;  Service: Open Heart Surgery;  Laterality: N/A;   REPLACEMENT ASCENDING AORTA N/A 04/26/2023   Procedure: REPLACEMENT ASCENDING AORTIC ANEURYSM USING HEMASHIELD PLATINUM GRAFT SIZE ;  Surgeon: Lucas Dorise POUR, MD;  Location: [facility] OR;  Service: Open Heart Surgery;  Laterality: N/A;   TEE WITHOUT CARDIOVERSION N/A 04/26/2023   Procedure: TRANSESOPHAGEAL ECHOCARDIOGRAM (TEE);  Surgeon: Lucas Dorise POUR, MD;  Location: [facility] OR;  Service: Open Heart Surgery;  Laterality: N/A;     Current Outpatient Medications  Medication Sig Dispense Refill   acetaminophen  (TYLENOL ) 500 MG tablet Take 1,000 mg by mouth every 6 (six) hours as needed for mild pain (pain score 1-3).     ALPRAZolam  (XANAX ) 1 MG tablet TAKE 1 TABLET(1 MG) BY MOUTH THREE TIMES DAILY AS NEEDED FOR ANXIETY 90 tablet 0   ascorbic acid (VITAMIN C ) 500 MG tablet Take 1 tablet (500 mg total) by mouth daily with breakfast. 30 tablet 1   aspirin  EC 81 MG tablet Take 1 tablet (81 mg total) by mouth daily. Swallow whole. 30 tablet 12   Buprenorphine HCl-Naloxone HCl 4-1 MG FILM Place under the tongue 3 (three) times daily.     empagliflozin  (JARDIANCE ) 10 MG TABS tablet Take 1 tablet (10 mg total) by mouth daily before breakfast. 90 tablet 3   escitalopram  (LEXAPRO ) 10 MG tablet TAKE 1 TABLET(10 MG) BY MOUTH DAILY 90 tablet 0   furosemide  (LASIX ) 40 MG tablet Take 40 mg by mouth 2 (two) times daily.     lisinopril  (ZESTRIL ) 20 MG tablet Take 20 mg by mouth daily. (Patient not taking: Reported on 01/25/2024)     lisinopril  (ZESTRIL ) 40 MG tablet Take 1 tablet (40 mg total) by mouth daily. 90 tablet 0   omeprazole  (PRILOSEC) 40 MG capsule Take 1 capsule (40 mg total) by mouth 2 (two) times daily. 60 capsule 1   warfarin (COUMADIN ) 2 MG tablet Take 1 tablet (2 mg total) by mouth daily. 30 tablet 5   No current facility-administered medications for this visit.   Allergies:  Patient has no known allergies.   Social History: The patient  reports that he quit smoking about 4 years ago. His smoking use included cigarettes. He started smoking about 10 months ago. He has a 0.4 pack-year smoking history. He has never been exposed to tobacco smoke. He has never used smokeless tobacco. He reports that he does not drink alcohol and does not use drugs.   Family History: The patient's family history includes Cancer in his father and mother; Depression in his mother.   ROS:  Please see the history of present illness. Otherwise,  complete review of systems is positive for none  All other systems are reviewed and negative.   Physical Exam: VS:  There were no vitals taken for this visit., BMI There is no height or weight on file to calculate BMI.  Wt Readings from Last 3 Encounters:  01/25/24 232 lb (105.2 kg)  01/15/24 241 lb (109.3 kg)  10/17/23 232 lb (105.2 kg)    General: Patient appears comfortable at rest. HEENT: Conjunctiva and lids normal, oropharynx clear with moist mucosa. Neck: Supple, no elevated JVP or carotid bruits, no thyromegaly. Lungs: Clear to auscultation, nonlabored breathing at rest. Cardiac: Regular rate and rhythm, no S3 or significant systolic murmur, no pericardial rub.  Mechanical click present. Abdomen: Soft, nontender, no hepatomegaly, bowel sounds present, no guarding or rebound. Extremities: No pitting edema, distal pulses 2+. Skin: Warm and dry. Musculoskeletal: No kyphosis. Neuropsychiatric: Alert and  oriented x3, affect grossly appropriate.  Recent Labwork: 06/18/2023: ALT 29; AST 25 06/19/2023: B Natriuretic Peptide 344.0; Magnesium  2.1 01/15/2024: BUN 21; Creatinine, Ser 1.13; Hemoglobin 10.6; Platelets 234; Potassium 4.5; Pro Brain Natriuretic Peptide 1,098.0; Sodium 142     Component Value Date/Time   CHOL 171 06/19/2023 0431   TRIG 93 06/19/2023 0431   HDL 41 06/19/2023 0431   CHOLHDL 4.2 06/19/2023 0431   VLDL 19 06/19/2023 0431   LDLCALC 111 (H) 06/19/2023 0431     Assessment and Plan:  Chronic diastolic heart failure - Dry weight is around 225 pounds.  Gained 16 pounds since June 2025, treated with p.o. Lasix  80 mg twice daily for 1 week followed by p.o. Lasix  40 mg twice daily, currently on p.o. Lasix  40 mg once daily.  His weight today is 276 pounds.  He has mild leg swelling.  No DOE.  Will increase p.o. Lasix  from 40 mg once daily to 40 mg and 80 mg alternate day regimen. - Currently taking Jardiance  5 mg once daily, encouraged to take 10 mg once daily. -  Continue lisinopril  20 mg once daily. - Start spironolactone  20 mg once daily.  Discussed side effects.  Obtain BMP in 5 days. - Echocardiogram from November 2025 showed normal LV function, G2 DD, normal RV function, mild TR, mean prosthetic aortic valve PG 16 mmHg and CVP 3 mmHg.  Paroxysmal atrial fibrillation - New onset in the postop setting. He was started on amiodarone  200 mg once daily that was later discontinued by electrophysiology. On metoprolol  25 mg twice daily but due to severe sinus bradycardia and dizziness, discontinued in the last clinic visit.  Event monitor from 02/08/2024 did not reveal any atrial arrhythmias. - Continue Coumadin  2 mg once daily, follows up with Coumadin  clinic. - Goal INR between 2 and 3.  Bicuspid aortic valve - Normal aortic prosthesis in March 2025 and November 2025.  Normal LVEF but has G2 DD. 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  - Continue Coumadin  2 mg once daily, follows with the Coumadin  clinic.  Aspirin  discontinued. - Goal INR is between 1.5 and 2 for On-X aortic prosthesis.   30 minutes spent in reviewing prior medical records, specialist notes, more than 3 labs, discussion of the above problems with the patient and documentation.   Medication Adjustments/Labs and Tests Ordered: Current medicines are reviewed at length with the patient today.  Concerns regarding medicines are outlined above.    Disposition:  Follow up 6 months  Signed Wymon Swaney Priya Juandiego Kolenovic, MD, 02/26/2024 10:51 AM    [facility] at  [facility] [address] [place], [place], [place] [address]

## 2024-02-26 NOTE — Telephone Encounter (Signed)
 Pt had a prescription called in today and would like [facility] to [facility] into [facility] [address].

## 2024-02-26 NOTE — Progress Notes (Signed)
 Chase Lee [id number] 1980/09/01 43 y.o.  Virtual Visit via Video Note  I connected with pt @ on 02/26/24 at  5:30 PM EST by a video enabled telemedicine application and verified that I am speaking with the correct person using two identifiers.   I discussed the limitations of evaluation and management by telemedicine and the availability of in person appointments. The patient expressed understanding and agreed to proceed.  I discussed the assessment and treatment plan with the patient. The patient was provided an opportunity to ask questions and all were answered. The patient agreed with the plan and demonstrated an understanding of the instructions.   The patient was advised to call back or seek an in-person evaluation if the symptoms worsen or if the condition fails to improve as anticipated.  I provided 20 minutes of non-face-to-face time during this encounter.  The patient was located at home.  The provider was located at [facility].   Angeline LOISE Sayers, NP   Subjective:   Patient ID:  Chase Lee is a 44 y.o. (DOB Sep 10, 1980) male.  Chief Complaint: No chief complaint on file.   HPI Chase Lee presents for follow-up of MDD, panic attacks and GAD.   Describes mood today as ok. Pleasant. Denies tearfulness. Mood symptoms - reports decreased depression it's off and on. Reports improved interest and motivation. Reports missing his mother this holiday season. Reports decreased anxiety -it's been pretty good. Reports panic attacks limiting what he does at times. Reports irritability at times. Reports some worry, rumination and over thinking - situational. Denies obsessional thoughts or acts. Reports mood is stable. Stating I feel like I'm doing ok. Feels like medications are helpful. Taking medications as prescribed.  Energy levels improved. Active, walking at work. Enjoys some usual interests and activities. Married. Lives with wife.  Family in [place]. Spending time with family. Appetite adequate. Reports weight gain - 232 pounds. Reports sleeping well most nights. Averages 7 hours. Reports focus and concentration has improved. Completing tasks. Managing aspects of household. Working full time - armed guard position. Denies SI or HI.  Denies AH or VH. Denies self harm. Denies substance use.   Previous medications: Prozac, Zoloft  Review of Systems:  Review of Systems  Musculoskeletal:  Negative for gait problem.  Neurological:  Negative for tremors.  Psychiatric/Behavioral:         Please refer to HPI    Medications: I have reviewed the patient's current medications.  Current Outpatient Medications  Medication Sig Dispense Refill   acetaminophen  (TYLENOL ) 500 MG tablet Take 1,000 mg by mouth every 6 (six) hours as needed for mild pain (pain score 1-3).     ALPRAZolam  (XANAX ) 1 MG tablet TAKE 1 TABLET(1 MG) BY MOUTH THREE TIMES DAILY AS NEEDED FOR ANXIETY 90 tablet 0   ascorbic acid (VITAMIN C ) 500 MG tablet Take 1 tablet (500 mg total) by mouth daily with breakfast. 30 tablet 1   Buprenorphine HCl-Naloxone HCl 4-1 MG FILM Place under the tongue 3 (three) times daily.     empagliflozin  (JARDIANCE ) 10 MG TABS tablet Take 1 tablet (10 mg total) by mouth daily before breakfast. 90 tablet 3   escitalopram  (LEXAPRO ) 10 MG tablet TAKE 1 TABLET(10 MG) BY MOUTH DAILY 90 tablet 0   furosemide  (LASIX ) 40 MG tablet Take 40 mg (1 tablet) daily alternating with 80 mg (2 tablets) daily 405 tablet 3   lisinopril  (ZESTRIL ) 20 MG tablet Take 20 mg by mouth daily.  omeprazole  (PRILOSEC) 40 MG capsule Take 1 capsule (40 mg total) by mouth 2 (two) times daily. 60 capsule 1   spironolactone  (ALDACTONE ) 25 MG tablet Take 1 tablet (25 mg total) by mouth daily. 90 tablet 3   warfarin (COUMADIN ) 2 MG tablet Take 1 tablet (2 mg total) by mouth daily. 30 tablet 5   No current facility-administered medications for this visit.     Medication Side Effects: None  Allergies: No Known Allergies  Past Medical History:  Diagnosis Date   Anxiety    History of artificial heart valve    a. s/p aortic valvuloplasty at age 67 b. subsequent mechanical AVR at 43 yo c. 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   HTN (hypertension) 04/05/2023    Family History  Problem Relation Age of Onset   Depression Mother    Cancer Mother    Cancer Father    Colon cancer Neg Hx    Colon polyps Neg Hx     Social History   Socioeconomic History   Marital status: Married    Spouse name: Not on file   Number of children: 0   Years of education: Not on file   Highest education level: Some college, no degree  Occupational History   Occupation: Airline Pilot    Comment: Works for [employer]  Tobacco Use   Smoking status: Former    Current packs/day: 0.50    Average packs/day: 0.5 packs/day for 0.9 years (0.4 ttl pk-yrs)    Types: Cigarettes    Start date: 04/06/2023    Quit date: 08/10/2019    Passive exposure: Never   Smokeless tobacco: Never   Tobacco comments:    Tries to chew nicotine gum at work - smoking 3 ciggs per day   Vaping Use   Vaping status: Never Used  Substance and Sexual Activity   Alcohol use: Never   Drug use: Never   Sexual activity: Yes    Birth control/protection: None  Other Topics Concern   Not on file  Social History Narrative   Not on file   Social Drivers of Health   Financial Resource Strain: Medium Risk (06/19/2023)   Overall Financial Resource Strain (CARDIA)    Difficulty of Paying Living Expenses: Somewhat hard  Food Insecurity: No Food Insecurity (06/19/2023)   Hunger Vital Sign    Worried About Running Out of Food in the Last Year: Never true    Ran Out of Food in the Last Year: Never true  Transportation Needs: No Transportation Needs (06/19/2023)   PRAPARE - Administrator, Civil Service  (Medical): No    Lack of Transportation (Non-Medical): No  Physical Activity: Not on file  Stress: Not on file  Social Connections: Socially Integrated (04/20/2023)   Social Connection and Isolation Panel    Frequency of Communication with Friends and Family: More than three times a week    Frequency of Social Gatherings with Friends and Family: More than three times a week    Attends Religious Services: 1 to 4 times per year    Active Member of [employer] or Organizations: Yes    Attends Banker Meetings: 1 to 4 times per year    Marital Status: Married  Catering Manager Violence: Not At Risk (06/18/2023)   Humiliation, Afraid, Rape, and Kick questionnaire    Fear of Current or Ex-Partner: No    Emotionally Abused: No  Physically Abused: No    Sexually Abused: No    Past Medical History, Surgical history, Social history, and Family history were reviewed and updated as appropriate.   Please see review of systems for further details on the patient's review from today.   Objective:   Physical Exam:  There were no vitals taken for this visit.  Physical Exam Constitutional:      General: He is not in acute distress. Musculoskeletal:        General: No deformity.  Neurological:     Mental Status: He is alert and oriented to person, place, and time.     Coordination: Coordination normal.  Psychiatric:        Attention and Perception: Attention and perception normal. He does not perceive auditory or visual hallucinations.        Mood and Affect: Mood normal. Mood is not anxious or depressed. Affect is not labile, blunt, angry or inappropriate.        Speech: Speech normal.        Behavior: Behavior normal.        Thought Content: Thought content normal. Thought content is not paranoid or delusional. Thought content does not include homicidal or suicidal ideation. Thought content does not include homicidal or suicidal plan.        Cognition and Memory: Cognition and memory  normal.        Judgment: Judgment normal.     Comments: Insight intact     Lab Review:     Component Value Date/Time   NA 142 01/15/2024 1148   K 4.5 01/15/2024 1148   CL 104 01/15/2024 1148   CO2 28 01/15/2024 1148   GLUCOSE 90 01/15/2024 1148   BUN 21 (H) 01/15/2024 1148   CREATININE 1.13 01/15/2024 1148   CALCIUM  8.8 (L) 01/15/2024 1148   PROT 6.8 06/18/2023 0805   ALBUMIN  3.7 06/18/2023 0805   AST 25 06/18/2023 0805   ALT 29 06/18/2023 0805   ALKPHOS 94 06/18/2023 0805   BILITOT 0.5 06/18/2023 0805   GFRNONAA >60 01/15/2024 1148   GFRAA >60 08/12/2019 0734       Component Value Date/Time   WBC 6.3 01/15/2024 1522   RBC 4.17 (L) 01/15/2024 1522   HGB 10.6 (L) 01/15/2024 1522   HCT 34.7 (L) 01/15/2024 1522   PLT 234 01/15/2024 1522   MCV 83.2 01/15/2024 1522   MCH 25.4 (L) 01/15/2024 1522   MCHC 30.5 01/15/2024 1522   RDW 16.4 (H) 01/15/2024 1522   LYMPHSABS 1.0 06/18/2023 0805   MONOABS 0.5 06/18/2023 0805   EOSABS 0.1 06/18/2023 0805   BASOSABS 0.0 06/18/2023 0805    No results found for: POCLITH, LITHIUM   No results found for: PHENYTOIN, PHENOBARB, VALPROATE, CBMZ   .res Assessment: Plan:    Plan:  Lexapro  10mg  at hs Xanax  1mg  TID  RTC 3 months  20 minutes spent dedicated to the care of this patient on the date of this encounter to include pre-visit review of records, ordering of medication, post visit documentation, and face-to-face time with the patient discussing MDD, panic attacks and GAD. Discussed continuing current medication regimen.  Patient advised to contact office with any questions, adverse effects, or acute worsening in signs and symptoms.  Discussed potential benefits, risk, and side effects of benzodiazepines to include potential risk of tolerance and dependence, as well as possible drowsiness. Advised patient not to drive if experiencing drowsiness and to take lowest possible effective dose to minimize risk of  dependence  and tolerance.  There are no diagnoses linked to this encounter.   Please see After Visit Summary for patient specific instructions.  Future Appointments  Date Time Provider Department Center  02/26/2024  5:30 PM Sebrena Engh Nattalie, NP [facility] None  03/24/2024 10:30 AM [facility]  [facility]    No orders of the defined types were placed in this encounter.     -------------------------------

## 2024-02-26 NOTE — Telephone Encounter (Signed)
 Prescriptions [facility] [address] [address] [facility] [address] [facility] per patients request.

## 2024-02-26 NOTE — Patient Instructions (Signed)
 Medication Instructions:  Your physician has recommended you make the following change in your medication:   -Start Lasix  40 mg daily alternating with 80 mg daily  -Start Spironolactone  25 mg once daily   *If you need a refill on your cardiac medications before your next appointment, please call your pharmacy*  Lab Work: In 5 days:  -BMP   If you have labs (blood work) drawn today and your tests are completely normal, you will receive your results only by: MyChart Message (if you have MyChart) OR A paper copy in the mail If you have any lab test that is abnormal or we need to change your treatment, we will call you to review the results.  Testing/Procedures: None  Follow-Up: At [facility], you and your health needs are our priority.  As part of our continuing mission to provide you with exceptional heart care, our providers are all part of one team.  This team includes your primary Cardiologist (physician) and Advanced Practice Providers or APPs (Physician Assistants and Nurse Practitioners) who all work together to provide you with the care you need, when you need it.  Your next appointment:   6 month(s)  Provider:   You may see Vishnu P Mallipeddi, MD or one of the following Advanced Practice Providers on your designated Care Team:   Brittany Strader, PA-C  [place], [place] Olivia Pavy, [place]     We recommend signing up for the patient portal called MyChart.  Sign up information is provided on this After Visit Summary.  MyChart is used to connect with patients for Virtual Visits (Telemedicine).  Patients are able to view lab/test results, encounter notes, upcoming appointments, etc.  Non-urgent messages can be sent to your provider as well.   To learn more about what you can do with MyChart, go to [email or web address].   Other Instructions

## 2024-02-27 ENCOUNTER — Ambulatory Visit

## 2024-02-27 ENCOUNTER — Ambulatory Visit: Admitting: Internal Medicine

## 2024-03-04 ENCOUNTER — Ambulatory Visit: Admitting: Adult Health

## 2024-03-18 ENCOUNTER — Inpatient Hospital Stay (HOSPITAL_COMMUNITY)
Admission: EM | Admit: 2024-03-18 | Discharge: 2024-03-24 | DRG: 699 | Disposition: A | Discharge status: Home / Self Care | Attending: Internal Medicine | Admitting: Internal Medicine

## 2024-03-18 ENCOUNTER — Encounter (HOSPITAL_COMMUNITY): Payer: Self-pay | Admitting: *Deleted

## 2024-03-18 ENCOUNTER — Other Ambulatory Visit: Payer: Self-pay

## 2024-03-18 ENCOUNTER — Emergency Department (HOSPITAL_COMMUNITY)

## 2024-03-18 DIAGNOSIS — I110 Hypertensive heart disease with heart failure: Secondary | ICD-10-CM | POA: Diagnosis present

## 2024-03-18 DIAGNOSIS — Z7984 Long term (current) use of oral hypoglycemic drugs: Secondary | ICD-10-CM | POA: Diagnosis not present

## 2024-03-18 DIAGNOSIS — K219 Gastro-esophageal reflux disease without esophagitis: Secondary | ICD-10-CM | POA: Diagnosis present

## 2024-03-18 DIAGNOSIS — I10 Essential (primary) hypertension: Secondary | ICD-10-CM | POA: Diagnosis not present

## 2024-03-18 DIAGNOSIS — Z7901 Long term (current) use of anticoagulants: Secondary | ICD-10-CM | POA: Diagnosis not present

## 2024-03-18 DIAGNOSIS — I480 Paroxysmal atrial fibrillation: Secondary | ICD-10-CM | POA: Diagnosis present

## 2024-03-18 DIAGNOSIS — Z79899 Other long term (current) drug therapy: Secondary | ICD-10-CM

## 2024-03-18 DIAGNOSIS — K5900 Constipation, unspecified: Secondary | ICD-10-CM | POA: Diagnosis not present

## 2024-03-18 DIAGNOSIS — Z6836 Body mass index (BMI) 36.0-36.9, adult: Secondary | ICD-10-CM | POA: Diagnosis not present

## 2024-03-18 DIAGNOSIS — R81 Glycosuria: Secondary | ICD-10-CM | POA: Diagnosis present

## 2024-03-18 DIAGNOSIS — R112 Nausea with vomiting, unspecified: Secondary | ICD-10-CM | POA: Diagnosis present

## 2024-03-18 DIAGNOSIS — F411 Generalized anxiety disorder: Secondary | ICD-10-CM | POA: Diagnosis present

## 2024-03-18 DIAGNOSIS — Z8774 Personal history of (corrected) congenital malformations of heart and circulatory system: Secondary | ICD-10-CM

## 2024-03-18 DIAGNOSIS — Z818 Family history of other mental and behavioral disorders: Secondary | ICD-10-CM

## 2024-03-18 DIAGNOSIS — D6859 Other primary thrombophilia: Secondary | ICD-10-CM | POA: Diagnosis present

## 2024-03-18 DIAGNOSIS — Z952 Presence of prosthetic heart valve: Secondary | ICD-10-CM | POA: Diagnosis not present

## 2024-03-18 DIAGNOSIS — I5032 Chronic diastolic (congestive) heart failure: Secondary | ICD-10-CM | POA: Diagnosis present

## 2024-03-18 DIAGNOSIS — I5030 Unspecified diastolic (congestive) heart failure: Secondary | ICD-10-CM

## 2024-03-18 DIAGNOSIS — R109 Unspecified abdominal pain: Secondary | ICD-10-CM | POA: Diagnosis present

## 2024-03-18 DIAGNOSIS — E1165 Type 2 diabetes mellitus with hyperglycemia: Secondary | ICD-10-CM

## 2024-03-18 DIAGNOSIS — Z87891 Personal history of nicotine dependence: Secondary | ICD-10-CM | POA: Diagnosis not present

## 2024-03-18 DIAGNOSIS — N280 Ischemia and infarction of kidney: Secondary | ICD-10-CM | POA: Diagnosis present

## 2024-03-18 DIAGNOSIS — Z8679 Personal history of other diseases of the circulatory system: Secondary | ICD-10-CM

## 2024-03-18 DIAGNOSIS — R791 Abnormal coagulation profile: Secondary | ICD-10-CM

## 2024-03-18 DIAGNOSIS — F32A Depression, unspecified: Secondary | ICD-10-CM | POA: Diagnosis present

## 2024-03-18 DIAGNOSIS — E66812 Obesity, class 2: Secondary | ICD-10-CM | POA: Diagnosis present

## 2024-03-18 LAB — CBC WITH DIFFERENTIAL/PLATELET
Abs Immature Granulocytes: 0.03 K/uL (ref 0.00–0.07)
Basophils Absolute: 0 K/uL (ref 0.0–0.1)
Basophils Relative: 0 %
Eosinophils Absolute: 0 K/uL (ref 0.0–0.5)
Eosinophils Relative: 0 %
HCT: 41.4 % (ref 39.0–52.0)
Hemoglobin: 13.3 g/dL (ref 13.0–17.0)
Immature Granulocytes: 0 %
Lymphocytes Relative: 10 %
Lymphs Abs: 1 K/uL (ref 0.7–4.0)
MCH: 26.2 pg (ref 26.0–34.0)
MCHC: 32.1 g/dL (ref 30.0–36.0)
MCV: 81.7 fL (ref 80.0–100.0)
Monocytes Absolute: 0.5 K/uL (ref 0.1–1.0)
Monocytes Relative: 5 %
Neutro Abs: 8.3 K/uL — ABNORMAL HIGH (ref 1.7–7.7)
Neutrophils Relative %: 85 %
Platelets: 254 K/uL (ref 150–400)
RBC: 5.07 MIL/uL (ref 4.22–5.81)
RDW: 17.7 % — ABNORMAL HIGH (ref 11.5–15.5)
WBC: 9.8 K/uL (ref 4.0–10.5)
nRBC: 0 % (ref 0.0–0.2)

## 2024-03-18 LAB — COMPREHENSIVE METABOLIC PANEL WITH GFR
ALT: 57 U/L — ABNORMAL HIGH (ref 0–44)
AST: 88 U/L — ABNORMAL HIGH (ref 15–41)
Albumin: 4.7 g/dL (ref 3.5–5.0)
Alkaline Phosphatase: 89 U/L (ref 38–126)
Anion gap: 14 (ref 5–15)
BUN: 17 mg/dL (ref 6–20)
CO2: 23 mmol/L (ref 22–32)
Calcium: 9.2 mg/dL (ref 8.9–10.3)
Chloride: 98 mmol/L (ref 98–111)
Creatinine, Ser: 1.25 mg/dL — ABNORMAL HIGH (ref 0.61–1.24)
GFR, Estimated: >60 mL/min
Glucose, Bld: 167 mg/dL — ABNORMAL HIGH (ref 70–99)
Potassium: 4.5 mmol/L (ref 3.5–5.1)
Sodium: 135 mmol/L (ref 135–145)
Total Bilirubin: 0.4 mg/dL (ref 0.0–1.2)
Total Protein: 7.4 g/dL (ref 6.5–8.1)

## 2024-03-18 LAB — URINALYSIS, ROUTINE W REFLEX MICROSCOPIC
Bacteria, UA: NONE SEEN
Bilirubin Urine: NEGATIVE
Glucose, UA: >=500 mg/dL — AB
Hgb urine dipstick: NEGATIVE
Ketones, ur: NEGATIVE mg/dL
Leukocytes,Ua: NEGATIVE
Nitrite: NEGATIVE
Protein, ur: NEGATIVE mg/dL
Specific Gravity, Urine: 1.026 (ref 1.005–1.030)
pH: 6 (ref 5.0–8.0)

## 2024-03-18 LAB — GLUCOSE, CAPILLARY: Glucose-Capillary: 131 mg/dL — ABNORMAL HIGH (ref 70–99)

## 2024-03-18 LAB — LIPASE, BLOOD: Lipase: 21 U/L (ref 11–51)

## 2024-03-18 LAB — LACTATE DEHYDROGENASE: LDH: 924 U/L — ABNORMAL HIGH (ref 105–235)

## 2024-03-18 LAB — PROTIME-INR
INR: 1.4 — ABNORMAL HIGH (ref 0.8–1.2)
Prothrombin Time: 18.1 s — ABNORMAL HIGH (ref 11.4–15.2)

## 2024-03-18 MED ORDER — FUROSEMIDE 40 MG PO TABS
40.0000 mg | ORAL_TABLET | Freq: Every day | ORAL | Status: DC
  Administered 2024-03-19 – 2024-03-24 (×6): 40 mg via ORAL
  Filled 2024-03-18 (×6): qty 1

## 2024-03-18 MED ORDER — INSULIN ASPART 100 UNIT/ML IJ SOLN
0.0000 [IU] | Freq: Three times a day (TID) | INTRAMUSCULAR | Status: DC
  Administered 2024-03-19 – 2024-03-20 (×3): 2 [IU] via SUBCUTANEOUS
  Administered 2024-03-20: 5 [IU] via SUBCUTANEOUS
  Administered 2024-03-21 (×2): 2 [IU] via SUBCUTANEOUS
  Administered 2024-03-21 – 2024-03-22 (×2): 3 [IU] via SUBCUTANEOUS
  Administered 2024-03-22: 2 [IU] via SUBCUTANEOUS
  Administered 2024-03-23 – 2024-03-24 (×4): 3 [IU] via SUBCUTANEOUS
  Filled 2024-03-18 (×13): qty 1

## 2024-03-18 MED ORDER — ONDANSETRON HCL 4 MG/2ML IJ SOLN
4.0000 mg | Freq: Four times a day (QID) | INTRAMUSCULAR | Status: DC | PRN
  Administered 2024-03-19 – 2024-03-24 (×9): 4 mg via INTRAVENOUS
  Filled 2024-03-18 (×9): qty 2

## 2024-03-18 MED ORDER — ONDANSETRON HCL 4 MG/2ML IJ SOLN
4.0000 mg | Freq: Once | INTRAMUSCULAR | Status: AC
  Administered 2024-03-18: 4 mg via INTRAVENOUS
  Filled 2024-03-18: qty 2

## 2024-03-18 MED ORDER — HEPARIN BOLUS VIA INFUSION
5250.0000 [IU] | Freq: Once | INTRAVENOUS | Status: AC
  Administered 2024-03-18: 5250 [IU] via INTRAVENOUS

## 2024-03-18 MED ORDER — BUPRENORPHINE HCL-NALOXONE HCL 8-2 MG SL SUBL: 1.0000 | SUBLINGUAL_TABLET | Freq: Three times a day (TID) | SUBLINGUAL | Status: DC

## 2024-03-18 MED ORDER — EMPAGLIFLOZIN 10 MG PO TABS
10.0000 mg | ORAL_TABLET | Freq: Every day | ORAL | Status: DC
  Administered 2024-03-19 – 2024-03-24 (×6): 10 mg via ORAL
  Filled 2024-03-18 (×6): qty 1

## 2024-03-18 MED ORDER — LISINOPRIL 10 MG PO TABS
20.0000 mg | ORAL_TABLET | Freq: Every evening | ORAL | Status: DC
  Administered 2024-03-19 – 2024-03-24 (×5): 20 mg via ORAL
  Filled 2024-03-18 (×7): qty 2

## 2024-03-18 MED ORDER — IOHEXOL 300 MG/ML  SOLN
100.0000 mL | Freq: Once | INTRAMUSCULAR | Status: AC | PRN
  Administered 2024-03-18: 100 mL via INTRAVENOUS

## 2024-03-18 MED ORDER — PANTOPRAZOLE SODIUM 40 MG PO TBEC
40.0000 mg | DELAYED_RELEASE_TABLET | Freq: Every day | ORAL | Status: DC
  Administered 2024-03-18 – 2024-03-24 (×7): 40 mg via ORAL
  Filled 2024-03-18 (×7): qty 1

## 2024-03-18 MED ORDER — WARFARIN - PHARMACIST DOSING INPATIENT: Freq: Every day | Status: DC

## 2024-03-18 MED ORDER — HEPARIN (PORCINE) 25000 UT/250ML-% IV SOLN
2150.0000 [IU]/h | INTRAVENOUS | Status: DC
  Administered 2024-03-18: 1450 [IU]/h via INTRAVENOUS
  Administered 2024-03-19 – 2024-03-20 (×2): 1650 [IU]/h via INTRAVENOUS
  Administered 2024-03-20: 1750 [IU]/h via INTRAVENOUS
  Administered 2024-03-22 (×2): 2050 [IU]/h via INTRAVENOUS
  Administered 2024-03-23: 2150 [IU]/h via INTRAVENOUS
  Administered 2024-03-23: 2050 [IU]/h via INTRAVENOUS
  Filled 2024-03-18 (×9): qty 250

## 2024-03-18 MED ORDER — MORPHINE SULFATE (PF) 4 MG/ML IV SOLN
4.0000 mg | Freq: Once | INTRAVENOUS | Status: AC
  Administered 2024-03-18: 4 mg via INTRAVENOUS
  Filled 2024-03-18: qty 1

## 2024-03-18 MED ORDER — HYDROMORPHONE HCL 1 MG/ML IJ SOLN
1.0000 mg | Freq: Once | INTRAMUSCULAR | Status: AC
  Administered 2024-03-18: 1 mg via INTRAVENOUS
  Filled 2024-03-18: qty 1

## 2024-03-18 MED ORDER — ESCITALOPRAM OXALATE 10 MG PO TABS
10.0000 mg | ORAL_TABLET | Freq: Every day | ORAL | Status: DC
  Administered 2024-03-18 – 2024-03-23 (×5): 10 mg via ORAL
  Filled 2024-03-18 (×7): qty 1

## 2024-03-18 MED ORDER — VITAMIN C 500 MG PO TABS
500.0000 mg | ORAL_TABLET | Freq: Every day | ORAL | Status: DC
  Administered 2024-03-19 – 2024-03-24 (×6): 500 mg via ORAL
  Filled 2024-03-18 (×6): qty 1

## 2024-03-18 MED ORDER — HYDROMORPHONE HCL 1 MG/ML IJ SOLN
0.5000 mg | INTRAMUSCULAR | Status: DC | PRN
  Administered 2024-03-18 – 2024-03-24 (×35): 0.5 mg via INTRAVENOUS
  Filled 2024-03-18 (×35): qty 0.5

## 2024-03-18 MED ORDER — ONDANSETRON HCL 4 MG PO TABS
4.0000 mg | ORAL_TABLET | Freq: Four times a day (QID) | ORAL | Status: DC | PRN
  Administered 2024-03-20: 4 mg via ORAL
  Filled 2024-03-18: qty 1

## 2024-03-18 MED ORDER — ALPRAZOLAM 1 MG PO TABS
1.0000 mg | ORAL_TABLET | Freq: Two times a day (BID) | ORAL | Status: DC | PRN
  Administered 2024-03-18 – 2024-03-19 (×2): 1 mg via ORAL
  Filled 2024-03-18 (×2): qty 1

## 2024-03-18 MED ORDER — WARFARIN SODIUM 2 MG PO TABS
4.0000 mg | ORAL_TABLET | Freq: Once | ORAL | Status: AC
  Administered 2024-03-18: 4 mg via ORAL
  Filled 2024-03-18: qty 1
  Filled 2024-03-18: qty 2

## 2024-03-18 MED ORDER — SPIRONOLACTONE 25 MG PO TABS
25.0000 mg | ORAL_TABLET | Freq: Every day | ORAL | Status: DC
  Administered 2024-03-19 – 2024-03-24 (×6): 25 mg via ORAL
  Filled 2024-03-18 (×6): qty 1

## 2024-03-18 MED ORDER — BUPRENORPHINE HCL-NALOXONE HCL 8-2 MG SL SUBL: 1.0000 | SUBLINGUAL_TABLET | Freq: Every day | SUBLINGUAL | Status: DC

## 2024-03-18 NOTE — ED Provider Notes (Addendum)
 " [place] [facility] DE[facility]MENT AT [facility] Provider Note   CSN: [id number] Arrival date & time: 03/18/24  1100     Patient presents with: Flank Pain   Chase Lee is a 43 y.o. male with a history including hypertension, paroxysmal a fib on Coumadin , history of CHF, history of ascending aortic aneurysm with surgical repair, GERD presenting for evaluation of right flank pain which started yesterday evening while at work, he describes mild to moderate pain which has slowly worsened over the course of today.  Pain now radiates into his right lower abdomen to pelvis region, no GU involvement.  He denies history of kidney stones.  He has had no dysuria or hematuria.  He also denies fevers or chills, he does feel bloated but has had a bowel movement today.  He tried to eat this morning but became very nauseated and diaphoretic.  Has had no treatment prior to arrival.   The history is provided by the patient.       Prior to Admission medications  Medication Sig Start Date End Date Taking? Authorizing Provider  acetaminophen  (TYLENOL ) 500 MG tablet Take 1,000 mg by mouth every 6 (six) hours as needed for mild pain (pain score 1-3).    [provider]  ALPRAZolam  (XANAX ) 1 MG tablet TAKE 1 TABLET(1 MG) BY MOUTH THREE TIMES DAILY AS NEEDED FOR ANXIETY 02/26/24   Mozingo, Regina Nattalie, NP  ascorbic acid  (VITAMIN C ) 500 MG tablet Take 1 tablet (500 mg total) by mouth daily with breakfast. 06/19/23   Johnson, Clanford L, MD  Buprenorphine  HCl-Naloxone  HCl 4-1 MG FILM Place under the tongue 3 (three) times daily. 10/06/23   [provider]  empagliflozin  (JARDIANCE ) 10 MG TABS tablet Take 1 tablet (10 mg total) by mouth daily before breakfast. 01/15/24   Mallipeddi, Vishnu P, MD  escitalopram  (LEXAPRO ) 10 MG tablet TAKE 1 TABLET(10 MG) BY MOUTH DAILY 02/26/24   Mozingo, Regina Nattalie, NP  furosemide  (LASIX ) 40 MG tablet Take 40 mg (1 tablet) daily alternating  with 80 mg (2 tablets) daily 02/26/24   Mallipeddi, Vishnu P, MD  lisinopril  (ZESTRIL ) 20 MG tablet Take 20 mg by mouth daily. 12/22/23   [provider]  omeprazole  (PRILOSEC) 40 MG capsule Take 1 capsule (40 mg total) by mouth 2 (two) times daily. 06/19/23   Johnson, Clanford L, MD  spironolactone  (ALDACTONE ) 25 MG tablet Take 1 tablet (25 mg total) by mouth daily. 02/26/24 05/26/24  Mallipeddi, Vishnu P, MD  warfarin (COUMADIN ) 2 MG tablet Take 1 tablet (2 mg total) by mouth daily. 12/19/23   Mallipeddi, Diannah SQUIBB, MD    Allergies: Patient has no known allergies.    Review of Systems  Constitutional:  Positive for diaphoresis. Negative for fever.  HENT:  Negative for congestion and sore throat.   Eyes: Negative.   Respiratory:  Negative for chest tightness and shortness of breath.   Cardiovascular:  Negative for chest pain.  Gastrointestinal:  Positive for abdominal pain, nausea and vomiting.  Genitourinary:  Positive for flank pain. Negative for dysuria, hematuria, scrotal swelling and testicular pain.  Musculoskeletal:  Negative for arthralgias, joint swelling and neck pain.  Skin: Negative.  Negative for rash and wound.  Neurological:  Negative for dizziness, weakness, light-headedness, numbness and headaches.  Psychiatric/Behavioral: Negative.      Updated Vital Signs BP (!) 153/94 (BP Location: Right Arm)   Pulse 76   Temp 97.8 F (36.6 C) (Temporal)   Resp 18  Ht 5' 6 (1.676 m)   Wt 103.4 kg   SpO2 100%   BMI 36.80 kg/m   Physical Exam Vitals and nursing note reviewed.  Constitutional:      Appearance: He is well-developed.  HENT:     Head: Normocephalic and atraumatic.  Eyes:     Conjunctiva/sclera: Conjunctivae normal.  Cardiovascular:     Rate and Rhythm: Normal rate and regular rhythm.     Heart sounds: Normal heart sounds.  Pulmonary:     Effort: Pulmonary effort is normal.     Breath sounds: Normal breath sounds. No wheezing.  Abdominal:     General:  Bowel sounds are normal.     Palpations: Abdomen is soft. There is no mass.     Tenderness: There is abdominal tenderness. There is right CVA tenderness. There is no guarding.  Musculoskeletal:        General: Normal range of motion.     Cervical back: Normal range of motion.  Skin:    General: Skin is warm and dry.  Neurological:     Mental Status: He is alert.     (all labs ordered are listed, but only abnormal results are displayed) Labs Reviewed  URINALYSIS, ROUTINE W REFLEX MICROSCOPIC - Abnormal; Notable for the following components:      Result Value   Color, Urine STRAW (*)    Glucose, UA >=500 (*)    All other components within normal limits  COMPREHENSIVE METABOLIC PANEL WITH GFR - Abnormal; Notable for the following components:   Glucose, Bld 167 (*)    Creatinine, Ser 1.25 (*)    AST 88 (*)    ALT 57 (*)    All other components within normal limits  CBC WITH DIFFERENTIAL/PLATELET - Abnormal; Notable for the following components:   RDW 17.7 (*)    Neutro Abs 8.3 (*)    All other components within normal limits  PROTIME-INR - Abnormal; Notable for the following components:   Prothrombin Time 18.1 (*)    INR 1.4 (*)    All other components within normal limits  LIPASE, BLOOD    EKG: None  Radiology: CT ABDOMEN PELVIS W CONTRAST Result Date: 03/18/2024 EXAM: CT ABDOMEN AND PELVIS WITH CONTRAST 03/18/2024 03:01:49 PM TECHNIQUE: CT of the abdomen and pelvis was performed with the administration of 100 mL of iohexol  (OMNIPAQUE ) 300 MG/ML solution. Multiplanar reformatted images are provided for review. Automated exposure control, iterative reconstruction, and/or weight-based adjustment of the mA/kV was utilized to reduce the radiation dose to as low as reasonably achievable. COMPARISON: 06/18/2023 CLINICAL HISTORY: RLQ abdominal pain; right lateral and right lower abdominal pain. FINDINGS: LOWER CHEST: No acute abnormality. LIVER: Diffuse low density throughout the  liver is compatible with fatty infiltration. GALLBLADDER AND BILE DUCTS: Gallbladder is unremarkable. No biliary ductal dilatation. SPLEEN: No acute abnormality. PANCREAS: No acute abnormality. ADRENAL GLANDS: No acute abnormality. KIDNEYS, URETERS AND BLADDER: Loss of perfusion to the upper pole of the right kidney concerning for renal infarct. No stones in the kidneys or ureters. No hydronephrosis. No perinephric or periureteral stranding. Urinary bladder is unremarkable. GI AND BOWEL: Stomach demonstrates no acute abnormality. There is no bowel obstruction. Normal appendix. PERITONEUM AND RETROPERITONEUM: No ascites. No free air. VASCULATURE: Aorta is normal in caliber. LYMPH NODES: No lymphadenopathy. REPRODUCTIVE ORGANS: No acute abnormality. BONES AND SOFT TISSUES: No acute osseous abnormality. No focal soft tissue abnormality. IMPRESSION: 1. Loss of perfusion to the upper pole of the right kidney concerning for renal  infarct. 2. Fatty liver. Electronically signed by: Franky Crease MD 03/18/2024 03:46 PM EST RP Workstation: [id number]     Procedures   Medications Ordered in the ED  morphine  (PF) 4 MG/ML injection 4 mg (4 mg Intravenous Given 03/18/24 1205)  ondansetron  (ZOFRAN ) injection 4 mg (4 mg Intravenous Given 03/18/24 1204)  iohexol  (OMNIPAQUE ) 300 MG/ML solution 100 mL (100 mLs Intravenous Contrast Given 03/18/24 1428)  HYDROmorphone  (DILAUDID ) injection 1 mg (1 mg Intravenous Given 03/18/24 1514)                                    Medical Decision Making Patient presenting with gradual onset worsening right flank pain radiating to his right lower abdomen/pelvis region.  Progressively worse since last night, nausea with emesis with attempts to eat this morning.  Sensation of abdominal distention but no exam findings to suggest SBO.  Normal bowel movement today which did not relieve his symptoms.  Differential diagnosis including kidney stones, pyelonephritis, ureteral stones, lower UTI,  acute appendicitis, small bowel obstruction, musculoskeletal pain.  Amount and/or Complexity of Data Reviewed Labs: ordered.    Details: Labs reviewed including a normal CBC, WBC count is 9.8, his urinalysis is negative for infection or blood, he does have greater than 500 glucose in his urine.  His CMET significant for glucose of 167, his creatinine today is 1.25 which is not too far off from his baseline of 1.18-1.20.  He does have an elevation of his LFTs, AST is 88, ALT is 57, known fatty liver, no old LFTs available to compare. Radiology: ordered.    Details: CT shows loss of perfusion in the right upper kidney suggesting infarction. Discussion of management or test interpretation with external provider(s): Patient and CT findings discussed with Dr. Norine with renal, she recommended patient admission for embolic workup, may want to consider urology involvement, renal has no other recommendations to add.   Pt discussed with Dr. Evonnie who accepts pt for admission.  Risk Decision regarding hospitalization. Diagnosis or treatment significantly limited by social determinants of health. Risk Details: Had a long conversation with patient regarding today's findings and his real risk of losing the use of his right kidney, he is very hesitant to be admitted after having a prolonged admission earlier this year when he had his thoracic surgery.  After phone conversation with his wife he has decided to leave AMA, he was strongly encouraged to return if he changes his mind.  His INR today is subtherapeutic at 1.4, he states that his Coumadin  nurse has a goal between 1.5-2.0 and he has been taking 2 mg alternating with 3 mg Coumadin  q. OD.  He was asked to increase his dose to 5 mg for the next 3 evenings, then cut back to 2.5 mg, he has an appointment with his Coumadin  clinic in 5 days.  He was strongly encouraged to return for admission.   6:11 PM after patient was discharged AMA, he had a conversation with  his father in the waiting room and was convinced he needed to be admitted, patient was readmitted to the ED, call was placed to the hospitalist for admission.         Final diagnoses:  Renal infarction  Subtherapeutic international normalized ratio (INR)    ED Discharge Orders     None          Birdena Clarity, PA-C 03/18/24 1739    Kais Monje,  Aalyah Mansouri, PA-C 03/18/24 1843    Melvenia Motto, MD 03/22/24 1139  "

## 2024-03-18 NOTE — Hospital Course (Addendum)
 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  CT abdomen pelvis showed loss of perfusion of the right upper kidney consistent with a renal infarct. INR is 1.4--clearly subtherapeutic--likely the etiology of the patient's acute thromboembolic event resulting in renal infarct; nephrology, Dr. Norine consulted by EDP.  I have asked EDP to start IV heparin .

## 2024-03-18 NOTE — ED Notes (Signed)
 Patient verified he took his last dose of Coumadin  2mg  at 2230.

## 2024-03-18 NOTE — ED Triage Notes (Signed)
 Pt with pain to right lower back pain that radiates around to right groin.  Denies hx of kidney stones

## 2024-03-18 NOTE — ED Notes (Signed)
 ED Provider at bedside.

## 2024-03-18 NOTE — H&P (Incomplete)
 " History and Physical    Patient: Chase Lee FMW:[id number] DOB: 30-Aug-1980 DOA: 03/18/2024 DOS: the patient was seen and examined on 03/18/2024 PCP: [facility]  Patient coming from: Home  Chief Complaint:  Chief Complaint  Patient presents with   Flank Pain   HPI: Chase Lee is a 44 y.o. 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  Pain progressed through the night and was rated 10/10 on pain scale.  This was associated with nausea or vomiting which started this morning.  ED course In the emergency department, he was hemodynamically stable.  BP was 153/94.  Workup in ED showed normal CBC and BMP except for blood glucose of 167, creatinine 1.25, AST 88, ALT 57.  Urinalysis was positive for glycosuria. CT abdomen and pelvis with contrast showed loss of perfusion to the upper pole of the right kidney concerning for renal infarct. She was treated with Dilaudid , morphine  and Zofran .  Heparin  drip was started.  TRH was asked to admit patient. Dr. Norine with renal was consulted and recommended admitting patient for embolic workup.  Urology involvement was suggested.    Review of Systems: As mentioned in the history of present illness. All other systems reviewed and are negative. Past Medical History:  Diagnosis Date   Anxiety    History of artificial heart valve    a. s/p aortic valvuloplasty at age 58 b. subsequent mechanical AVR  at 43 yo c. 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   HTN (hypertension) 04/05/2023   Past Surgical History:  Procedure Laterality Date   BENTALL PROCEDURE N/A 04/26/2023   Procedure: BENTALL PROCEDURE USING ON-X ASCENDING AORTIC PROSTHESIS SIZE ;  Surgeon: Lucas Dorise POUR, MD;  Location: [facility] OR;  Service: Open Heart Surgery;  Laterality: N/A;  CIRC ARREST   CARDIAC SURGERY     REDO STERNOTOMY N/A 04/26/2023   Procedure: REDO STERNOTOMY;  Surgeon: Lucas Dorise POUR, MD;  Location: [facility] OR;  Service: Open Heart Surgery;  Laterality: N/A;   REPLACEMENT ASCENDING AORTA N/A 04/26/2023   Procedure: REPLACEMENT ASCENDING AORTIC ANEURYSM USING HEMASHIELD PLATINUM GRAFT SIZE ;  Surgeon: Lucas Dorise POUR, MD;  Location: [facility] OR;  Service: Open Heart Surgery;  Laterality: N/A;   TEE WITHOUT CARDIOVERSION N/A 04/26/2023   Procedure: TRANSESOPHAGEAL ECHOCARDIOGRAM (TEE);  Surgeon: Lucas Dorise POUR, MD;  Location: [facility] OR;  Service: Open Heart Surgery;  Laterality: N/A;   Social History:  reports that he quit smoking about 4 years ago. His smoking use included cigarettes. He started smoking about a year ago. He has a 0.5 pack-year smoking history. He has never been exposed to tobacco smoke. He has never used smokeless tobacco. He reports that he does not drink alcohol and does not use drugs.  Allergies[1]  Family History  Problem Relation Age of Onset   Depression Mother    Cancer Mother    Cancer Father    Colon cancer Neg Hx  Colon polyps Neg Hx     Prior to Admission medications  Medication Sig Start Date End Date Taking? Authorizing Provider  acetaminophen  (TYLENOL ) 500 MG tablet Take 1,000 mg by mouth every 6 (six) hours as needed for mild pain (pain score 1-3).    [provider]  ALPRAZolam  (XANAX ) 1 MG tablet TAKE 1 TABLET(1 MG) BY MOUTH THREE TIMES DAILY AS NEEDED FOR ANXIETY 02/26/24   Mozingo, Regina  Nattalie, NP  ascorbic acid (VITAMIN C ) 500 MG tablet Take 1 tablet (500 mg total) by mouth daily with breakfast. 06/19/23   Vicci Afton CROME, MD  Buprenorphine HCl-Naloxone HCl 4-1 MG FILM Place under the tongue 3 (three) times daily. 10/06/23   [provider]  empagliflozin  (JARDIANCE ) 10 MG TABS tablet Take 1 tablet (10 mg total) by mouth daily before breakfast. 01/15/24   Mallipeddi, Vishnu P, MD  escitalopram  (LEXAPRO ) 10 MG tablet TAKE 1 TABLET(10 MG) BY MOUTH DAILY 02/26/24   Mozingo, Regina Nattalie, NP  furosemide  (LASIX ) 40 MG tablet Take 40 mg (1 tablet) daily alternating with 80 mg (2 tablets) daily 02/26/24   Mallipeddi, Vishnu P, MD  lisinopril  (ZESTRIL ) 20 MG tablet Take 20 mg by mouth daily. 12/22/23   [provider]  omeprazole  (PRILOSEC) 40 MG capsule Take 1 capsule (40 mg total) by mouth 2 (two) times daily. 06/19/23   Johnson, Clanford L, MD  spironolactone  (ALDACTONE ) 25 MG tablet Take 1 tablet (25 mg total) by mouth daily. 02/26/24 05/26/24  Mallipeddi, Vishnu P, MD  warfarin (COUMADIN ) 2 MG tablet Take 1 tablet (2 mg total) by mouth daily. 12/19/23   Mallipeddi, Diannah SQUIBB, MD    Physical Exam: Vitals:   03/18/24 1113 03/18/24 1115 03/18/24 1117 03/18/24 1628  BP:  (!) 151/76  (!) 153/94  Pulse: 74 70  76  Resp:    18  Temp:   97.9 F (36.6 C) 97.8 F (36.6 C)  TempSrc:   Oral Temporal  SpO2: 99% 97%  100%  Weight:      Height:       General: Awake and alert and oriented x3. Not in any acute distress.  HEENT: NCAT.  PERRLA. EOMI. Sclerae anicteric.  Moist mucosal membranes. Neck: Neck supple without lymphadenopathy. No carotid bruits. No masses palpated.  Cardiovascular: Regular rate with normal S1-S2 sounds. No murmurs, rubs or gallops auscultated. No JVD.  Respiratory: Clear breath sounds.  No accessory muscle use. Abdomen: Soft, right CVA and RLQ tenderness without guarding, nondistended. Active bowel sounds. No masses or hepatosplenomegaly  Skin: No  rashes, lesions, or ulcerations.  Dry, warm to touch. Musculoskeletal:  2+ dorsalis pedis and radial pulses. Good ROM.  No contractures  Psychiatric: Intact judgment and insight.  Mood appropriate to current condition. Neurologic: No focal neurological deficits. Strength is 5/5 x 4.  CN II - XII grossly intact.  Assessment and Plan: Acute renal infarction CT abdomen and pelvis showed loss of perfusion to the upper pole of the right kidney concerning for renal infarct. This may be due to patient's history of atrial fibrillation considering the subtherapeutic nature of warfarin He was started on IV heparin  drip, continue warfarin per pharmacy dosing LDH pending.  2. Subtherapeutic INR 3. Acquired thrombophilia INR 1.4, continue heparin  and warfarin as described above Continue to monitor INR  4. Acquired thrombophilia Continue management with heparin  or warfarin as described  5. Chronic HFpEF Continue total input/output, daily weights and fluid restriction Continue home Lasix , Aldactone , lisinopril , Jardiance  Continue heart healthy  Echocardiogram in November 2025 showed LVEF of 55 to 60%.  G2 DD.   6. Type 2 diabetes mellitus with hyperglycemia Hemoglobin A1c February 2025 was 6.5, A1c will be rechecked Continue ISS and hypoglycemia protocol  7. GERD Continue Protonix   7. Essential hypertension Continue lisinopril   8. Paroxysmal atrial fibrillation No rate control medication noted in patient's med rec Continue anticoagulant as described above for 1  9. GAD Continue Lexapro , Xanax   10.  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 Inpatient status is judged to be reasonable and necessary in order to provide the required intensity of service to ensure the patient's safety. The  patient's presenting symptoms, physical exam findings, and initial radiographic and laboratory data in the context of their chronic comorbidities is felt to place them at high risk for further clinical deterioration. Furthermore, it is not anticipated that the patient will be medically stable for discharge from the hospital within 2 midnights of admission.   * I certify that at the point of admission it is my clinical judgment that the patient will require inpatient hospital care spanning beyond 2 midnights from the point of admission due to high intensity of service, high risk for further deterioration and high frequency of surveillance required.*  Author: Andrell Tallman, DO 03/18/2024 7:47 PM  For on call review [email or web address].      [1] No Known Allergies  "

## 2024-03-18 NOTE — Progress Notes (Addendum)
 PHARMACY - ANTICOAGULATION CONSULT NOTE  Pharmacy Consult for Heparin  & Warfarin Indication: suspected renal embolism  Allergies[1]  Patient Measurements: Height: 5' 6 (167.6 cm) Weight: 103.4 kg (228 lb) IBW/kg (Calculated) : 63.8 HEPARIN  DW (KG): 86.9  Vital Signs: Temp: 97.8 F (36.6 C) (12/30 1628) Temp Source: Temporal (12/30 1628) BP: 153/94 (12/30 1628) Pulse Rate: 76 (12/30 1628)  Labs: Recent Labs    03/18/24 1202  HGB 13.3  HCT 41.4  PLT 254  LABPROT 18.1*  INR 1.4*  CREATININE 1.25*    Estimated Creatinine Clearance: 84 mL/min (A) (by C-G formula based on SCr of 1.25 mg/dL (H)).   Medical History: Past Medical History:  Diagnosis Date   Anxiety    History of artificial heart valve    a. s/p aortic valvuloplasty at age 21 b. subsequent mechanical AVR at 43 yo c. 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   HTN (hypertension) 04/05/2023    Medications:  (Not in a hospital admission)  Scheduled:   heparin   5,250 Units Intravenous Once   Infusions:   heparin      PRN:  Anti-infectives (From admission, onward)    None       Assessment: Patient is a 43 yo M presenting with right lower back pain radiating around to right groin. PMH significant for hypertension, paroxysmal a fib on Coumadin , history of CHF, history of ascending aortic aneurysm with surgical repair, and GERD. CT findings are concerning for a suspected renal embolism. Patient is on Warfarin at home for Afib. Baseline INR was checked, which came back subtherapeutic at 1.4 (Goal INR 1.5-2 since patient has an On-X valve). Pharmacy has been consulted for Warfarin and Heparin  bridging due to low INR. Patient is taking Warfarin 3 mg on TTS and 2 mg on all other days PTA. Patient's last Warfarin dose (2 mg) was last night on 03/18/24.   Baseline CBC w/ Hgb 13.3 and PLTs 254 Baseline INR 1.4  Goal of Therapy:   INR 1.5 - 2  Anti-Xa 0.3-0.7 Monitor platelets by anticoagulation protocol: Yes   Plan:  - Will give Heparin  5250 units IV bolus x 1 - Will start Heparin  gtt rate at 1450 units/hr - Will give a Warfarin 4 mg dose tonight since last dose was yesterday night  - Will check a HL in 6 hrs from infusion start - Will continue Heparin  until INR is therapeutic  - Will check CBC and INR daily         [1] No Known Allergies

## 2024-03-18 NOTE — ED Notes (Signed)
 Hospitalist in room with patient discussing admission plan.

## 2024-03-18 NOTE — Discharge Instructions (Signed)
 Your CT scan confirms that half of your right kidney appears to was lost its blood supply and is highly likely you will lose the function of this kidney without  further testing and treatment.  Your Coumadin  level is subtherapeutic today with your INR at 1.4.  You are recommended to take one of your 5 mg tablets tonight, Wednesday night and Thursday night, then return 1/2 tablet nightly for 2.5 mg dosing.  If you have not decided to return here to be admitted for this kidney problem you will need to have your INR rechecked by your provider no later than Monday.  If you change your mind about being admitted, do not hesitate to return here for this.

## 2024-03-19 DIAGNOSIS — N280 Ischemia and infarction of kidney: Secondary | ICD-10-CM | POA: Diagnosis not present

## 2024-03-19 LAB — COMPREHENSIVE METABOLIC PANEL WITH GFR
ALT: 65 U/L — ABNORMAL HIGH (ref 0–44)
AST: 100 U/L — ABNORMAL HIGH (ref 15–41)
Albumin: 4.6 g/dL (ref 3.5–5.0)
Alkaline Phosphatase: 107 U/L (ref 38–126)
Anion gap: 16 — ABNORMAL HIGH (ref 5–15)
BUN: 15 mg/dL (ref 6–20)
CO2: 21 mmol/L — ABNORMAL LOW (ref 22–32)
Calcium: 8.9 mg/dL (ref 8.9–10.3)
Chloride: 98 mmol/L (ref 98–111)
Creatinine, Ser: 1.09 mg/dL (ref 0.61–1.24)
GFR, Estimated: >60 mL/min
Glucose, Bld: 160 mg/dL — ABNORMAL HIGH (ref 70–99)
Potassium: 4.2 mmol/L (ref 3.5–5.1)
Sodium: 135 mmol/L (ref 135–145)
Total Bilirubin: 0.6 mg/dL (ref 0.0–1.2)
Total Protein: 7.2 g/dL (ref 6.5–8.1)

## 2024-03-19 LAB — CBC
HCT: 40.6 % (ref 39.0–52.0)
Hemoglobin: 13 g/dL (ref 13.0–17.0)
MCH: 27 pg (ref 26.0–34.0)
MCHC: 32 g/dL (ref 30.0–36.0)
MCV: 84.2 fL (ref 80.0–100.0)
Platelets: 204 K/uL (ref 150–400)
RBC: 4.82 MIL/uL (ref 4.22–5.81)
RDW: 18.6 % — ABNORMAL HIGH (ref 11.5–15.5)
WBC: 9.7 K/uL (ref 4.0–10.5)
nRBC: 0 % (ref 0.0–0.2)

## 2024-03-19 LAB — GLUCOSE, CAPILLARY
Glucose-Capillary: 125 mg/dL — ABNORMAL HIGH (ref 70–99)
Glucose-Capillary: 118 mg/dL — ABNORMAL HIGH (ref 70–99)
Glucose-Capillary: 98 mg/dL (ref 70–99)
Glucose-Capillary: 163 mg/dL — ABNORMAL HIGH (ref 70–99)

## 2024-03-19 LAB — HEPARIN LEVEL (UNFRACTIONATED)
Heparin Unfractionated: 0.22 [IU]/mL — ABNORMAL LOW (ref 0.30–0.70)
Heparin Unfractionated: 0.44 [IU]/mL (ref 0.30–0.70)
Heparin Unfractionated: 0.4 [IU]/mL (ref 0.30–0.70)

## 2024-03-19 LAB — HEMOGLOBIN A1C
Hgb A1c MFr Bld: 6.6 % — ABNORMAL HIGH (ref 4.8–5.6)
Mean Plasma Glucose: 142.72 mg/dL

## 2024-03-19 LAB — PROTIME-INR
INR: 1.5 — ABNORMAL HIGH (ref 0.8–1.2)
Prothrombin Time: 19.3 s — ABNORMAL HIGH (ref 11.4–15.2)

## 2024-03-19 LAB — PHOSPHORUS: Phosphorus: 3 mg/dL (ref 2.5–4.6)

## 2024-03-19 LAB — MAGNESIUM: Magnesium: 2.3 mg/dL (ref 1.7–2.4)

## 2024-03-19 MED ORDER — HEPARIN BOLUS VIA INFUSION
2000.0000 [IU] | Freq: Once | INTRAVENOUS | Status: AC
  Administered 2024-03-19: 2000 [IU] via INTRAVENOUS
  Filled 2024-03-19: qty 2000

## 2024-03-19 MED ORDER — DOCUSATE SODIUM 100 MG PO CAPS
100.0000 mg | ORAL_CAPSULE | Freq: Two times a day (BID) | ORAL | Status: DC
  Administered 2024-03-19 – 2024-03-23 (×9): 100 mg via ORAL
  Filled 2024-03-19 (×10): qty 1

## 2024-03-19 MED ORDER — WARFARIN SODIUM 2 MG PO TABS: 3.0000 mg | ORAL_TABLET | Freq: Once | ORAL | Status: DC

## 2024-03-19 MED ORDER — ALPRAZOLAM 1 MG PO TABS
1.0000 mg | ORAL_TABLET | Freq: Three times a day (TID) | ORAL | Status: DC | PRN
  Administered 2024-03-19 – 2024-03-24 (×14): 1 mg via ORAL
  Filled 2024-03-19 (×15): qty 1

## 2024-03-19 MED ORDER — WARFARIN SODIUM 2 MG PO TABS
4.0000 mg | ORAL_TABLET | Freq: Once | ORAL | Status: AC
  Administered 2024-03-19: 4 mg via ORAL
  Filled 2024-03-19 (×2): qty 2

## 2024-03-19 MED ORDER — POLYETHYLENE GLYCOL 3350 17 G PO PACK
17.0000 g | PACK | Freq: Every day | ORAL | Status: DC
  Administered 2024-03-19 – 2024-03-22 (×4): 17 g via ORAL
  Filled 2024-03-19 (×6): qty 1

## 2024-03-19 MED ORDER — ORAL CARE MOUTH RINSE: 15.0000 mL | OROMUCOSAL | Status: DC | PRN

## 2024-03-19 NOTE — TOC CM/SW Note (Signed)
 Transition of Care [facility]) - Inpatient Brief Assessment   Patient Details  Name: Chase Lee MRN: [id number] Date of Birth: 05-Oct-1980  Transition of Care [facility]) CM/SW Contact:    Lucie Lunger, LCSWA Phone Number: 03/19/2024, 9:01 AM   Clinical Narrative: Transition of Care Department [facility]) has reviewed patient and no TOC needs have been identified at this time. We will continue to monitor patient advancement through interdiciplinary progression rounds. If new patient transition needs arise, please place a TOC consult.  Transition of Care Asessment: Insurance and Status: Insurance coverage has been reviewed Patient has primary care physician: Yes Home environment has been reviewed: From home Prior level of function:: Independent Prior/Current Home Services: No current home services Social Drivers of Health Review: SDOH reviewed no interventions necessary Readmission risk has been reviewed: Yes Transition of care needs: no transition of care needs at this time

## 2024-03-19 NOTE — Progress Notes (Signed)
 PHARMACY - ANTICOAGULATION  Pharmacy Consult for Heparin  & Warfarin Indication: suspected renal embolism Brief A/P: Heparin  level subtherapeutic Increase Heparin  rate  Allergies[1]  Patient Measurements: Height: 5' 6 (167.6 cm) Weight: 103 kg (227 lb) IBW/kg (Calculated) : 63.8 HEPARIN  DW (KG): 86.7  Vital Signs: Temp: 99 F (37.2 C) (12/31 0431) Temp Source: Oral (12/31 0431) BP: 129/80 (12/31 0431) Pulse Rate: 73 (12/31 0431)  Labs: Recent Labs    03/18/24 1202 03/19/24 0416  HGB 13.3 13.0  HCT 41.4 40.6  PLT 254 204  LABPROT 18.1* 19.3*  INR 1.4* 1.5*  HEPARINUNFRC  --  0.22*  CREATININE 1.25* 1.09    Estimated Creatinine Clearance: 96.2 mL/min (by C-G formula based on SCr of 1.09 mg/dL).   Assessment: 43 y.o. 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  Cathlyn Arrant, PharmD, BCPS          [1] No Known Allergies

## 2024-03-19 NOTE — Progress Notes (Signed)
 Pt transferred from ED to room AP-317 with his wife at bedside. Pt is alert ans fully oriented x 4, afebrile, stable hemodynamically, NSR on the monitor, normal respiratory effort, no acute distress at arrival. Pt has complaints of right flank pain scale 8/10. Dr. Manfred notified. MD orders Dilaudid  0.5 mg IV PRN q 3 hrs. Pt is able to rest and sleep well after nursing interventions. Plan of care is reviewed. We will continue to monitor.   Wendi Dash, RN

## 2024-03-19 NOTE — Progress Notes (Signed)
 Nurse at bedside,New IV started 22 gauge to right hand times one attempt,patient tolerated procedure.Plan of care on going.

## 2024-03-19 NOTE — Plan of Care (Signed)
" °  Problem: Education: Goal: Knowledge of General Education information will improve Description: Including pain rating scale, medication(s)/side effects and non-pharmacologic comfort measures Outcome: Progressing   Problem: Health Behavior/Discharge Planning: Goal: Ability to manage health-related needs will improve Outcome: Progressing   Problem: Clinical Measurements: Goal: Ability to maintain clinical measurements within normal limits will improve Outcome: Progressing   Problem: Education: Goal: Ability to describe self-care measures that may prevent or decrease complications (Diabetes Survival Skills Education) will improve Outcome: Progressing   Problem: Coping: Goal: Ability to adjust to condition or change in health will improve Outcome: Progressing   Problem: Fluid Volume: Goal: Ability to maintain a balanced intake and output will improve Outcome: Progressing   "

## 2024-03-19 NOTE — Progress Notes (Signed)
 PHARMACY - ANTICOAGULATION CONSULT NOTE  Pharmacy Consult for Heparin  & Warfarin Indication: suspected renal embolism  Allergies[1]  Patient Measurements: Height: 5' 6 (167.6 cm) Weight: 103 kg (227 lb) IBW/kg (Calculated) : 63.8 HEPARIN  DW (KG): 86.7  Vital Signs: Temp: 99.6 F (37.6 C) (12/31 1354) Temp Source: Oral (12/31 1354) BP: 114/69 (12/31 1354) Pulse Rate: 81 (12/31 1354)  Labs: Recent Labs    03/18/24 1202 03/19/24 0416 03/19/24 1336  HGB 13.3 13.0  --   HCT 41.4 40.6  --   PLT 254 204  --   LABPROT 18.1* 19.3*  --   INR 1.4* 1.5*  --   HEPARINUNFRC  --  0.22* 0.44  CREATININE 1.25* 1.09  --     Estimated Creatinine Clearance: 96.2 mL/min (by C-G formula based on SCr of 1.09 mg/dL).   Medical History: Past Medical History:  Diagnosis Date   Anxiety    History of artificial heart valve    a. s/p aortic valvuloplasty at age 44 b. subsequent mechanical AVR at 43 yo c. 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   HTN (hypertension) 04/05/2023    Medications:  Medications Prior to Admission  Medication Sig Dispense Refill Last Dose/Taking   ALPRAZolam  (XANAX ) 1 MG tablet TAKE 1 TABLET(1 MG) BY MOUTH THREE TIMES DAILY AS NEEDED FOR ANXIETY (Patient taking differently: Take 1 mg by mouth 3 (three) times daily. TAKE 1 TABLET(1 MG) BY MOUTH THREE TIMES DAILY AS NEEDED FOR ANXIETY) 90 tablet 2 Taking Differently   Buprenorphine HCl-Naloxone HCl 8-2 MG FILM Place 1 Film under the tongue 3 (three) times daily.   03/17/2024 Bedtime   cyclobenzaprine (FLEXERIL) 5 MG tablet Take 5 mg by mouth at bedtime as needed.   Unknown   empagliflozin  (JARDIANCE ) 10 MG TABS tablet Take 1 tablet (10 mg total) by mouth daily before breakfast. 90 tablet 3 03/17/2024   escitalopram  (LEXAPRO ) 10 MG tablet TAKE 1 TABLET(10 MG) BY MOUTH DAILY 90 tablet 0 03/17/2024   furosemide  (LASIX ) 40 MG tablet Take  40 mg (1 tablet) daily alternating with 80 mg (2 tablets) daily 405 tablet 3 03/17/2024   lisinopril  (ZESTRIL ) 20 MG tablet Take 20 mg by mouth daily.   03/17/2024   omeprazole  (PRILOSEC) 40 MG capsule Take 1 capsule (40 mg total) by mouth 2 (two) times daily. 60 capsule 1 03/17/2024   warfarin (COUMADIN ) 2 MG tablet Take 1 tablet (2 mg total) by mouth daily. (Patient taking differently: Take 2-3 mg by mouth See admin instructions. Take 3mg  by mouth every Tuesday, Thursday, Saturday. Take 2mg  by mouth all other days.) 30 tablet 5 03/17/2024 at 10:30 PM   ascorbic acid (VITAMIN C ) 500 MG tablet Take 1 tablet (500 mg total) by mouth daily with breakfast. (Patient not taking: Reported on 03/18/2024) 30 tablet 1 Not Taking   spironolactone  (ALDACTONE ) 25 MG tablet Take 1 tablet (25 mg total) by mouth daily. (Patient not taking: Reported on 03/18/2024) 90 tablet 3 Not Taking   Scheduled:   ascorbic acid  500 mg Oral Q breakfast   empagliflozin   10 mg Oral QAC breakfast   escitalopram   10 mg Oral Daily   furosemide   40 mg Oral Daily   insulin  aspart  0-15 Units Subcutaneous TID WC   lisinopril   20 mg Oral QPM   pantoprazole   40 mg Oral Daily   spironolactone   25 mg Oral Daily   warfarin  3 mg Oral ONCE-1600   Warfarin - Pharmacist Dosing Inpatient   Does not apply q1600   Infusions:   heparin  1,650 Units/hr (03/19/24 1052)   PRN:  Anti-infectives (From admission, onward)    None       Assessment: Patient is a 43 yo M presenting with right lower back pain radiating around to right groin. PMH significant for hypertension, paroxysmal a fib on Coumadin , history of CHF, history of ascending aortic aneurysm with surgical repair, and GERD. CT findings are concerning for a suspected renal embolism. Patient is on Warfarin at home for Afib.  Pharmacy has been consulted for Warfarin and Heparin  bridging due to low INR. Patient is taking Warfarin 3 mg on TTS and 2 mg on all other days PTA. Patient's  last Warfarin dose (2 mg) was last night on 03/18/24.   Baseline CBC w/ Hgb 13.3 and PLTs 254 Baseline INR 1.4  Goal of Therapy:  INR 2-3 Anti-Xa 0.3-0.7 Monitor platelets by anticoagulation protocol: Yes   Plan:  Warfarin 4 mg x 1 dose Continue heparin  infusion at 1650 units/hr Heparin  level in 6 hours and daily Continue to monitor H&H and platelets          [1] No Known Allergies

## 2024-03-19 NOTE — Progress Notes (Signed)
 PHARMACY - ANTICOAGULATION CONSULT NOTE  Pharmacy Consult for Heparin  & Warfarin Indication: suspected renal embolism  Allergies[1]  Patient Measurements: Height: 5' 6 (167.6 cm) Weight: 103 kg (227 lb) IBW/kg (Calculated) : 63.8 HEPARIN  DW (KG): 86.7  Vital Signs: Temp: 99.9 F (37.7 C) (12/31 2013) Temp Source: Oral (12/31 2013) BP: 104/64 (12/31 2013) Pulse Rate: 81 (12/31 2013)  Labs: Recent Labs    03/18/24 1202 03/19/24 0416 03/19/24 1336 03/19/24 2010  HGB 13.3 13.0  --   --   HCT 41.4 40.6  --   --   PLT 254 204  --   --   LABPROT 18.1* 19.3*  --   --   INR 1.4* 1.5*  --   --   HEPARINUNFRC  --  0.22* 0.44 0.40  CREATININE 1.25* 1.09  --   --     Estimated Creatinine Clearance: 96.2 mL/min (by C-G formula based on SCr of 1.09 mg/dL).   Medical History: Past Medical History:  Diagnosis Date   Anxiety    History of artificial heart valve    a. s/p aortic valvuloplasty at age 5 b. subsequent mechanical AVR at 43 yo c. 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   HTN (hypertension) 04/05/2023    Medications:  Medications Prior to Admission  Medication Sig Dispense Refill Last Dose/Taking   ALPRAZolam  (XANAX ) 1 MG tablet TAKE 1 TABLET(1 MG) BY MOUTH THREE TIMES DAILY AS NEEDED FOR ANXIETY (Patient taking differently: Take 1 mg by mouth 3 (three) times daily. TAKE 1 TABLET(1 MG) BY MOUTH THREE TIMES DAILY AS NEEDED FOR ANXIETY) 90 tablet 2 Taking Differently   Buprenorphine HCl-Naloxone HCl 8-2 MG FILM Place 1 Film under the tongue 3 (three) times daily.   03/17/2024 Bedtime   cyclobenzaprine (FLEXERIL) 5 MG tablet Take 5 mg by mouth at bedtime as needed.   Unknown   empagliflozin  (JARDIANCE ) 10 MG TABS tablet Take 1 tablet (10 mg total) by mouth daily before breakfast. 90 tablet 3 03/17/2024   escitalopram  (LEXAPRO ) 10 MG tablet TAKE 1 TABLET(10 MG) BY MOUTH DAILY 90 tablet 0  03/17/2024   furosemide  (LASIX ) 40 MG tablet Take 40 mg (1 tablet) daily alternating with 80 mg (2 tablets) daily 405 tablet 3 03/17/2024   lisinopril  (ZESTRIL ) 20 MG tablet Take 20 mg by mouth daily.   03/17/2024   omeprazole  (PRILOSEC) 40 MG capsule Take 1 capsule (40 mg total) by mouth 2 (two) times daily. 60 capsule 1 03/17/2024   warfarin (COUMADIN ) 2 MG tablet Take 1 tablet (2 mg total) by mouth daily. (Patient taking differently: Take 2-3 mg by mouth See admin instructions. Take 3mg  by mouth every Tuesday, Thursday, Saturday. Take 2mg  by mouth all other days.) 30 tablet 5 03/17/2024 at 10:30 PM   ascorbic acid (VITAMIN C ) 500 MG tablet Take 1 tablet (500 mg total) by mouth daily with breakfast. (Patient not taking: Reported on 03/18/2024) 30 tablet 1 Not Taking   spironolactone  (ALDACTONE ) 25 MG tablet Take 1 tablet (25 mg total) by mouth daily. (Patient not taking: Reported on 03/18/2024) 90 tablet 3 Not Taking   Scheduled:   ascorbic acid  500 mg Oral Q breakfast   docusate sodium   100 mg Oral BID   empagliflozin   10 mg Oral QAC breakfast   escitalopram   10 mg Oral Daily   furosemide   40 mg Oral Daily   insulin  aspart  0-15 Units Subcutaneous  TID WC   lisinopril   20 mg Oral QPM   pantoprazole   40 mg Oral Daily   polyethylene glycol  17 g Oral Daily   spironolactone   25 mg Oral Daily   Warfarin - Pharmacist Dosing Inpatient   Does not apply q1600   Infusions:   heparin  1,650 Units/hr (03/19/24 1916)   PRN:  Anti-infectives (From admission, onward)    None       Assessment: Patient is a 43 yo M presenting with right lower back pain radiating around to right groin. PMH significant for hypertension, paroxysmal a fib on Coumadin , history of CHF, history of ascending aortic aneurysm with surgical repair, and GERD. CT findings are concerning for a suspected renal embolism. Patient is on Warfarin at home for Afib.  Pharmacy has been consulted for Warfarin and Heparin  bridging due to  low INR. Patient is taking Warfarin 3 mg on TTS and 2 mg on all other days PTA. Patient's last Warfarin dose (2 mg) was last night on 03/18/24.   Baseline CBC w/ Hgb 13.3 and PLTs 254 Baseline INR 1.4  Goal of Therapy:  INR 2-3 Anti-Xa 0.3-0.7 Monitor platelets by anticoagulation protocol: Yes  12/31: HL @ 2010 = therapeutic x 2   Plan:  Warfarin 4 mg x 1 dose Continue heparin  infusion at 1650 units/hr Heparin  level daily Continue to monitor H&H and platelets  Will M. Lenon, PharmD, BCPS Clinical Pharmacist 03/19/2024 9:41 PM     [1] No Known Allergies

## 2024-03-19 NOTE — Progress Notes (Signed)
 " PROGRESS NOTE    Chase Lee  FMW:[id number] DOB: Aug 27, 1980 DOA: 03/18/2024  PCP: [facility]   Chief Complaint  Patient presents with   Flank Pain    Brief Narrative:  As per H&P.  By Dr. Manfred on 03/18/2024  Chase Lee is a 42 y.o. 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  Pain progressed through the night and was rated 10/10 on pain scale.  This was associated with nausea or vomiting which started this morning.   ED course In the emergency department, he was hemodynamically stable.  BP was 153/94.  Workup in ED showed normal CBC and BMP except for blood glucose of 167, creatinine 1.25, AST 88, ALT 57.  Urinalysis was positive for glycosuria. CT abdomen and pelvis with contrast showed loss of perfusion to the upper pole of the right kidney concerning for renal infarct. She was treated with Dilaudid , morphine  and Zofran .  Heparin  drip was started.  TRH was asked to admit patient. Dr. Norine with renal was consulted and recommended admitting patient for embolic workup.  Urology involvement was suggested.  Assessment & Plan: 1-acute renal infarction - CT abdomen and pelvis demonstrated loss of perfusion to the upper pole of the right kidney concerning for renal infarct. - At time of admission patient warfarin level was subtherapeutic most likely increasing the chances for thrombi - Continue IV heparin  drip and the use of  Coumadin  (dosed per pharmacy) - Maintain adequate hydration - Continue analgesics - Follow clinical response.  2-subtherapeutic INR/acquired thrombophilia - We will continue management with heparin  and Coumadin  - Follow stability.  3-chronic diastolic heart failure - Appears to be stable and compensated - Will continue the use of Lasix , Aldactone , lisinopril  and Jardiance  - Continue to follow daily weights/strict intake and output and low-sodium diet - Recent echo in November 2025 demonstrating preserved ejection fraction and grade 2 diastolic dysfunction.  4-GERD - Continue PPI.  5-type 2 diabetes mellitus - A1c 6.6 - Follow CBG fluctuation and adjust hypoglycemic regimen as needed.  6-essential hypertension - Continue current antihypertensive agents - Follow vital signs.  7-paroxysmal atrial fibrillation - Continue management with anticoagulation therapy. - Rate control and currently sinus.  8-GAD/depression - No suicidal ideation or hallucination - Continue current anxiolytic and antidepressant management.  9-obesity class II -Body mass index is 36.64 kg/m.  -Low-calorie diet and portion control discussed with patient.    DVT prophylaxis: IV heparin . Code Status: Full code. Family Communication: No family at bedside. Disposition:   Status is: Inpatient Remains inpatient appropriate because: Continue IV heparin .   Consultants:  Nephrology service.  Procedures:  See below for x-ray reports.  Antimicrobials:  None   Subjective: Afebrile, no chest pain, no shortness of breath.  Patient reports some nausea and 1 episode of vomiting overnight.  Complaining of intermittent right flank pain.  Objective: Vitals:   03/19/24 0015 03/19/24 0431 03/19/24 0859 03/19/24 1354  BP: 121/71 129/80 133/89 114/69  Pulse: 72 73 83 81  Resp: 14 16  Temp: 99.2 F (37.3 C) 99 F (37.2 C) 98.5 F (36.9 C) 99.6 F (37.6 C)  TempSrc: Oral Oral Oral Oral  SpO2: 97%  99% 100% 99%  Weight:      Height:        Intake/Output Summary (Last 24 hours) at 03/19/2024 1701 Last data filed at 03/19/2024 1052 Gross per 24 hour  Intake 769.96 ml  Output --  Net 769.96 ml   Filed Weights   03/18/24 1112 03/18/24 2038  Weight: 103.4 kg 103 kg    Examination:  General exam: Appears calm and in no major distress; reports ongoing intermittent pain in his right flank. Respiratory system: Good saturation on room air; no using accessory muscles. Cardiovascular system: S1 & S2 heard, RRR. No JVD, murmurs, rubs, gallops or clicks. No pedal edema. Gastrointestinal system: Abdomen is obese, nondistended, soft and with tenderness to his right flank on palpation.  No guarding.  Positive bowel sounds. Central nervous system: Alert and oriented. No focal neurological deficits. Extremities: No cyanosis or clubbing; no edema. Skin: No petechiae. Psychiatry: Judgement and insight appear normal. Mood & affect appropriate.   Data Reviewed: I have personally reviewed following labs and imaging studies  CBC: Recent Labs  Lab 03/18/24 1202 03/19/24 0416  WBC 9.8 9.7  NEUTROABS 8.3*  --   HGB 13.3 13.0  HCT 41.4 40.6  MCV 81.7 84.2  PLT 254 204    Basic Metabolic Panel: Recent Labs  Lab 03/18/24 1202 03/19/24 0416  NA 135 135  K 4.5 4.2  CL 98 98  CO2 23 21*  GLUCOSE 167* 160*  BUN 17 15  CREATININE 1.25* 1.09  CALCIUM  9.2 8.9  MG  --  2.3  PHOS  --  3.0    GFR: Estimated Creatinine Clearance: 96.2 mL/min (by C-G formula based on SCr of 1.09 mg/dL).  Liver Function Tests: Recent Labs  Lab 03/18/24 1202 03/19/24 0416  AST 88* 100*  ALT 57* 65*  ALKPHOS 89 107  BILITOT 0.4 0.6  PROT 7.4 7.2  ALBUMIN  4.7 4.6    CBG: Recent Labs  Lab 03/18/24 2220 03/19/24 0724 03/19/24 1145 03/19/24 1643  GLUCAP 131* 125* 118* 98   Radiology Studies: CT ABDOMEN PELVIS W CONTRAST Result Date: 03/18/2024 EXAM: CT ABDOMEN AND PELVIS WITH CONTRAST  03/18/2024 03:01:49 PM TECHNIQUE: CT of the abdomen and pelvis was performed with the administration of 100 mL of iohexol  (OMNIPAQUE ) 300 MG/ML solution. Multiplanar reformatted images are provided for review. Automated exposure control, iterative reconstruction, and/or weight-based adjustment of the mA/kV was utilized to reduce the radiation dose to as low as reasonably achievable. COMPARISON: 06/18/2023 CLINICAL HISTORY: RLQ abdominal pain; right lateral and right lower abdominal pain. FINDINGS: LOWER CHEST: No acute abnormality. LIVER: Diffuse low density throughout the liver is compatible with fatty infiltration. GALLBLADDER AND BILE DUCTS: Gallbladder is unremarkable. No biliary ductal dilatation. SPLEEN: No acute abnormality. PANCREAS: No acute abnormality. ADRENAL GLANDS: No acute abnormality. KIDNEYS, URETERS AND BLADDER: Loss of perfusion to the upper pole of the right kidney concerning for renal infarct. No stones in the kidneys or ureters. No hydronephrosis. No perinephric or periureteral stranding. Urinary bladder is unremarkable. GI AND BOWEL: Stomach demonstrates no acute abnormality. There is no bowel obstruction. Normal appendix. PERITONEUM AND RETROPERITONEUM: No ascites. No free air. VASCULATURE: Aorta is normal in caliber. LYMPH NODES: No lymphadenopathy. REPRODUCTIVE ORGANS: No acute abnormality. BONES AND SOFT TISSUES: No acute osseous abnormality. No focal soft tissue abnormality. IMPRESSION: 1. Loss of perfusion  to the upper pole of the right kidney concerning for renal infarct. 2. Fatty liver. Electronically signed by: Franky Crease MD 03/18/2024 03:46 PM EST RP Workstation: [id number]    Scheduled Meds:  ascorbic acid  500 mg Oral Q breakfast   empagliflozin   10 mg Oral QAC breakfast   escitalopram   10 mg Oral Daily   furosemide   40 mg Oral Daily   insulin  aspart  0-15 Units Subcutaneous TID WC   lisinopril   20 mg Oral QPM   pantoprazole   40 mg Oral Daily   spironolactone   25 mg  Oral Daily   Warfarin - Pharmacist Dosing Inpatient   Does not apply q1600   Continuous Infusions:  heparin  1,650 Units/hr (03/19/24 1052)     LOS: 1 day    Time spent: 50 minutes  Eric Nunnery, MD [facility]   To contact the attending provider between 7A-7P or the covering provider during after hours 7P-7A, please log into the web site [email or web address] and access using universal [place] password for that web site. If you do not have the password, please call the hospital operator.  03/19/2024, 5:01 PM    "

## 2024-03-20 DIAGNOSIS — N280 Ischemia and infarction of kidney: Secondary | ICD-10-CM | POA: Diagnosis not present

## 2024-03-20 LAB — PROTIME-INR
INR: 1.6 — ABNORMAL HIGH (ref 0.8–1.2)
Prothrombin Time: 19.9 s — ABNORMAL HIGH (ref 11.4–15.2)

## 2024-03-20 LAB — GLUCOSE, CAPILLARY
Glucose-Capillary: 138 mg/dL — ABNORMAL HIGH (ref 70–99)
Glucose-Capillary: 219 mg/dL — ABNORMAL HIGH (ref 70–99)
Glucose-Capillary: 131 mg/dL — ABNORMAL HIGH (ref 70–99)
Glucose-Capillary: 129 mg/dL — ABNORMAL HIGH (ref 70–99)

## 2024-03-20 LAB — CBC
HCT: 46.2 % (ref 39.0–52.0)
Hemoglobin: 14.6 g/dL (ref 13.0–17.0)
MCH: 26.5 pg (ref 26.0–34.0)
MCHC: 31.6 g/dL (ref 30.0–36.0)
MCV: 83.8 fL (ref 80.0–100.0)
Platelets: 222 K/uL (ref 150–400)
RBC: 5.51 MIL/uL (ref 4.22–5.81)
RDW: 19.4 % — ABNORMAL HIGH (ref 11.5–15.5)
WBC: 11.4 K/uL — ABNORMAL HIGH (ref 4.0–10.5)
nRBC: 0 % (ref 0.0–0.2)

## 2024-03-20 LAB — HEPARIN LEVEL (UNFRACTIONATED)
Heparin Unfractionated: 0.18 [IU]/mL — ABNORMAL LOW (ref 0.30–0.70)
Heparin Unfractionated: 0.38 [IU]/mL (ref 0.30–0.70)

## 2024-03-20 MED ORDER — WARFARIN SODIUM 2 MG PO TABS: 2.0000 mg | ORAL_TABLET | ORAL | Status: DC

## 2024-03-20 MED ORDER — WARFARIN SODIUM 2 MG PO TABS
3.0000 mg | ORAL_TABLET | ORAL | Status: DC
  Filled 2024-03-20: qty 1

## 2024-03-20 NOTE — Plan of Care (Signed)
  Problem: Education: Goal: Knowledge of General Education information will improve Description: Including pain rating scale, medication(s)/side effects and non-pharmacologic comfort measures Outcome: Progressing   Problem: Health Behavior/Discharge Planning: Goal: Ability to manage health-related needs will improve Outcome: Progressing   Problem: Clinical Measurements: Goal: Ability to maintain clinical measurements within normal limits will improve Outcome: Progressing   Problem: Activity: Goal: Risk for activity intolerance will decrease Outcome: Progressing   Problem: Nutrition: Goal: Adequate nutrition will be maintained Outcome: Progressing   Problem: Coping: Goal: Level of anxiety will decrease Outcome: Progressing   Problem: Elimination: Goal: Will not experience complications related to bowel motility Outcome: Progressing   Problem: Skin Integrity: Goal: Risk for impaired skin integrity will decrease Outcome: Progressing   

## 2024-03-20 NOTE — Progress Notes (Signed)
 " PROGRESS NOTE    Chase Lee  FMW:[id number] DOB: 1980-06-10 DOA: 03/18/2024  PCP: [facility]   Chief Complaint  Patient presents with   Flank Pain    Brief Narrative:  As per H&P.  By Dr. Manfred on 03/18/2024  Chase Lee is a 44 y.o. 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  Pain progressed through the night and was rated 10/10 on pain scale.  This was associated with nausea or vomiting which started this morning.   ED course In the emergency department, he was hemodynamically stable.  BP was 153/94.  Workup in ED showed normal CBC and BMP except for blood glucose of 167, creatinine 1.25, AST 88, ALT 57.  Urinalysis was positive for glycosuria. CT abdomen and pelvis with contrast showed loss of perfusion to the upper pole of the right kidney concerning for renal infarct. She was treated with Dilaudid , morphine  and Zofran .  Heparin  drip was started.  TRH was asked to admit patient. Dr. Norine with renal was consulted and recommended admitting patient for embolic workup.  Urology involvement was suggested.  Assessment & Plan: 1-acute renal infarction - CT abdomen and pelvis demonstrated loss of perfusion to the upper pole of the right kidney concerning for renal infarct. - At time of admission patient warfarin level was subtherapeutic most likely increasing the chances for thrombi - Continue IV heparin  drip and the use of  Coumadin  (dosed per pharmacy) - Maintain adequate hydration - Continue analgesics as needed. - Continue to follow clinical response.  2-subtherapeutic INR/acquired thrombophilia - We will continue management with heparin  and Coumadin  - Follow INR levels for stability; goal is for INR between 2 and 3.  3-chronic diastolic heart failure - Appears to be stable and compensated - Will continue the use of Lasix , Aldactone , lisinopril  and Jardiance  - Continue to follow daily weights/strict intake and output and low-sodium diet - Recent echo in November 2025 demonstrating preserved ejection fraction and grade 2 diastolic dysfunction.  4-GERD - Continue PPI.  5-type 2 diabetes mellitus - A1c 6.6 - Follow CBG fluctuation and adjust hypoglycemic regimen as needed.  6-essential hypertension - Continue current antihypertensive agents - Follow vital signs.  7-paroxysmal atrial fibrillation - Continue management with anticoagulation therapy. - Rate control and currently sinus. - Patient is currently on heparin  drip while achieving therapeutic INR levels.  8-GAD/depression - No suicidal ideation or hallucination - Continue current anxiolytic and antidepressant management.  9-obesity class II -Body mass index is 36.64 kg/m.  -Low-calorie diet and portion control discussed with patient.  10-constipation - Continue the use of MiraLAX  and Colace - Patient advised to maintain adequate hydration and increase physical activity.    DVT prophylaxis: IV heparin . Code Status: Full code. Family Communication: No family at bedside. Disposition:   Status is: Inpatient Remains inpatient appropriate because: Continue IV heparin .   Consultants:  Nephrology service.  Procedures:  See below for x-ray reports.  Antimicrobials:  None   Subjective: No fever, no chest pain, no nausea or  vomiting.  Patient reports complaints of constipation.  Overall right flank pain  improving.  Objective: Vitals:   03/19/24 2013 03/20/24 0430 03/20/24 0800 03/20/24 1300  BP: 104/64 102/66 105/73 123/72  Pulse: 81 85  83  Resp: 18 18    Temp: 99.9 F (37.7 C) 98.2 F (36.8 C)  98.6 F (37 C)  TempSrc: Oral Oral  Oral  SpO2: 99% 96%  98%  Weight:      Height:        Intake/Output Summary (Last 24 hours) at 03/20/2024 1643 Last data filed at 03/20/2024 1500 Gross per 24 hour  Intake 469.05 ml  Output --  Net 469.05 ml   Filed Weights   03/18/24 1112 03/18/24 2038  Weight: 103.4 kg 103 kg    Examination: General exam: Alert, awake, oriented x 3; no chest pain, reports no nausea or vomiting today.  Still expressing complaints of constipation. Respiratory system: Good saturation on room air; no using accessory muscle. Cardiovascular system:RRR.  No rubs, no gallops, no JVD. Gastrointestinal system: Abdomen is obese nondistended, soft and without guarding.  Patient expressing less discomfort on his right flank.  Positive bowel sounds. Central nervous system:  No focal neurological deficits. Extremities: No C/C/E, +pedal pulses Skin: No petechiae. Psychiatry: Judgement and insight appear normal. Mood & affect appropriate.    Data Reviewed: I have personally reviewed following labs and imaging studies  CBC: Recent Labs  Lab 03/18/24 1202 03/19/24 0416 03/20/24 0424  WBC 9.8 9.7 11.4*  NEUTROABS 8.3*  --   --   HGB 13.3 13.0 14.6  HCT 41.4 40.6 46.2  MCV 81.7 84.2 83.8  PLT 254 204 222    Basic Metabolic Panel: Recent Labs  Lab 03/18/24 1202 03/19/24 0416  NA 135 135  K 4.5 4.2  CL 98 98  CO2 23 21*  GLUCOSE 167* 160*  BUN 17 15  CREATININE 1.25* 1.09  CALCIUM  9.2 8.9  MG  --  2.3  PHOS  --  3.0    GFR: Estimated Creatinine Clearance: 96.2 mL/min (by C-G formula based on SCr of 1.09 mg/dL).  Liver Function Tests: Recent Labs  Lab 03/18/24 1202 03/19/24 0416  AST 88* 100*  ALT 57* 65*  ALKPHOS 89 107  BILITOT 0.4 0.6  PROT  7.4 7.2  ALBUMIN  4.7 4.6    CBG: Recent Labs  Lab 03/19/24 1643 03/19/24 2016 03/20/24 0736 03/20/24 1111 03/20/24 1627  GLUCAP 98 163* 138* 219* 131*   Radiology Studies: No results found.   Scheduled Meds:  ascorbic acid  500 mg Oral Q breakfast   docusate sodium   100 mg Oral BID   empagliflozin   10 mg Oral QAC breakfast   escitalopram   10 mg Oral Daily   furosemide   40 mg Oral Daily   insulin  aspart  0-15 Units Subcutaneous TID WC   lisinopril   20 mg Oral QPM   pantoprazole   40 mg Oral Daily   polyethylene glycol  17 g Oral Daily   spironolactone   25 mg Oral Daily   Warfarin - Pharmacist Dosing Inpatient   Does not apply q1600   Continuous Infusions:  heparin  1,750 Units/hr (03/20/24 0644)     LOS: 2 days    Time spent: 50 minutes  Eric Nunnery, MD [facility]   To contact the attending provider between 7A-7P or the covering provider during after hours 7P-7A, please log into the web site [email or web address] and access using universal [place] password for that web site.  If you do not have the password, please call the hospital operator.  03/20/2024, 4:43 PM    "

## 2024-03-20 NOTE — Progress Notes (Signed)
 PHARMACY - ANTICOAGULATION CONSULT NOTE  Pharmacy Consult for Heparin  & Warfarin Indication: suspected renal embolism  Allergies[1]  Patient Measurements: Height: 5' 6 (167.6 cm) Weight: 103 kg (227 lb) IBW/kg (Calculated) : 63.8 HEPARIN  DW (KG): 86.7  Vital Signs: Temp: 98.2 F (36.8 C) (01/01 0430) Temp Source: Oral (01/01 0430) BP: 102/66 (01/01 0430) Pulse Rate: 85 (01/01 0430)  Labs: Recent Labs    03/18/24 1202 03/18/24 1202 03/19/24 0416 03/19/24 1336 03/19/24 2010 03/20/24 0424  HGB 13.3  --  13.0  --   --  14.6  HCT 41.4  --  40.6  --   --  46.2  PLT 254  --  204  --   --  222  LABPROT 18.1*  --  19.3*  --   --  19.9*  INR 1.4*  --  1.5*  --   --  1.6*  HEPARINUNFRC  --    < > 0.22* 0.44 0.40 0.18*  CREATININE 1.25*  --  1.09  --   --   --    < > = values in this interval not displayed.    Estimated Creatinine Clearance: 96.2 mL/min (by C-G formula based on SCr of 1.09 mg/dL).   Medical History: Past Medical History:  Diagnosis Date   Anxiety    History of artificial heart valve    a. s/p aortic valvuloplasty at age 68 b. subsequent mechanical AVR at 44 yo c. 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   HTN (hypertension) 04/05/2023    Medications:  Medications Prior to Admission  Medication Sig Dispense Refill Last Dose/Taking   ALPRAZolam  (XANAX ) 1 MG tablet TAKE 1 TABLET(1 MG) BY MOUTH THREE TIMES DAILY AS NEEDED FOR ANXIETY (Patient taking differently: Take 1 mg by mouth 3 (three) times daily. TAKE 1 TABLET(1 MG) BY MOUTH THREE TIMES DAILY AS NEEDED FOR ANXIETY) 90 tablet 2 Taking Differently   Buprenorphine HCl-Naloxone HCl 8-2 MG FILM Place 1 Film under the tongue 3 (three) times daily.   03/17/2024 Bedtime   cyclobenzaprine (FLEXERIL) 5 MG tablet Take 5 mg by mouth at bedtime as needed.   Unknown   empagliflozin  (JARDIANCE ) 10 MG TABS tablet Take 1 tablet (10 mg  total) by mouth daily before breakfast. 90 tablet 3 03/17/2024   escitalopram  (LEXAPRO ) 10 MG tablet TAKE 1 TABLET(10 MG) BY MOUTH DAILY 90 tablet 0 03/17/2024   furosemide  (LASIX ) 40 MG tablet Take 40 mg (1 tablet) daily alternating with 80 mg (2 tablets) daily 405 tablet 3 03/17/2024   lisinopril  (ZESTRIL ) 20 MG tablet Take 20 mg by mouth daily.   03/17/2024   omeprazole  (PRILOSEC) 40 MG capsule Take 1 capsule (40 mg total) by mouth 2 (two) times daily. 60 capsule 1 03/17/2024   warfarin (COUMADIN ) 2 MG tablet Take 1 tablet (2 mg total) by mouth daily. (Patient taking differently: Take 2-3 mg by mouth See admin instructions. Take 3mg  by mouth every Tuesday, Thursday, Saturday. Take 2mg  by mouth all other days.) 30 tablet 5 03/17/2024 at 10:30 PM   ascorbic acid (VITAMIN C ) 500 MG tablet Take 1 tablet (500 mg total) by mouth daily with breakfast. (Patient not taking: Reported on 03/18/2024) 30 tablet 1 Not Taking   spironolactone  (ALDACTONE ) 25 MG tablet Take 1 tablet (25 mg total) by mouth daily. (Patient not taking: Reported on 03/18/2024) 90 tablet 3 Not Taking   Scheduled:   ascorbic acid  500  mg Oral Q breakfast   docusate sodium   100 mg Oral BID   empagliflozin   10 mg Oral QAC breakfast   escitalopram   10 mg Oral Daily   furosemide   40 mg Oral Daily   insulin  aspart  0-15 Units Subcutaneous TID WC   lisinopril   20 mg Oral QPM   pantoprazole   40 mg Oral Daily   polyethylene glycol  17 g Oral Daily   spironolactone   25 mg Oral Daily   Warfarin - Pharmacist Dosing Inpatient   Does not apply q1600   Infusions:   heparin  1,650 Units/hr (03/20/24 0440)   PRN:  Anti-infectives (From admission, onward)    None       Assessment: Patient is a 44 yo M presenting with right lower back pain radiating around to right groin. PMH significant for hypertension, paroxysmal a fib on Coumadin , history of CHF, history of ascending aortic aneurysm with surgical repair, and GERD. CT findings are  concerning for a suspected renal embolism. Patient is on Warfarin at home for Afib.  Pharmacy has been consulted for Warfarin and Heparin  bridging due to low INR. Patient is taking Warfarin 3 mg on TTS and 2 mg on all other days PTA. Patient's last Warfarin dose (2 mg) was last night on 03/18/24.   Baseline CBC w/ Hgb 13.3 and PLTs 254 Baseline INR 1.4  Goal of Therapy:  INR 2-3 Anti-Xa 0.3-0.7 Monitor platelets by anticoagulation protocol: Yes  Heparin  level this AM is below goal range at 0.18, no overt bleeding or complications noted, no known issues with IV infusion.   Plan:  Increase heparin  infusion to 1750 units/hr Recheck heparin  level in 8 hrs. Heparin  level daily Continue to monitor H&H and platelets  Harlene Barlow, Berdine BIRCH, BCPS, BCCP Clinical Pharmacist  03/20/2024 6:32 AM   [facility] pharmacy phone numbers are listed on [email or web address]       [1] No Known Allergies

## 2024-03-20 NOTE — Plan of Care (Signed)

## 2024-03-21 DIAGNOSIS — N280 Ischemia and infarction of kidney: Secondary | ICD-10-CM | POA: Diagnosis not present

## 2024-03-21 LAB — PROTIME-INR
INR: 1.7 — ABNORMAL HIGH (ref 0.8–1.2)
Prothrombin Time: 20.7 s — ABNORMAL HIGH (ref 11.4–15.2)

## 2024-03-21 LAB — CBC
HCT: 39.9 % (ref 39.0–52.0)
Hemoglobin: 12.7 g/dL — ABNORMAL LOW (ref 13.0–17.0)
MCH: 26.6 pg (ref 26.0–34.0)
MCHC: 31.8 g/dL (ref 30.0–36.0)
MCV: 83.5 fL (ref 80.0–100.0)
Platelets: 208 K/uL (ref 150–400)
RBC: 4.78 MIL/uL (ref 4.22–5.81)
RDW: 18.2 % — ABNORMAL HIGH (ref 11.5–15.5)
WBC: 12.6 K/uL — ABNORMAL HIGH (ref 4.0–10.5)
nRBC: 0 % (ref 0.0–0.2)

## 2024-03-21 LAB — BASIC METABOLIC PANEL WITH GFR
Anion gap: 6 (ref 5–15)
BUN: 17 mg/dL (ref 6–20)
CO2: 32 mmol/L (ref 22–32)
Calcium: 9 mg/dL (ref 8.9–10.3)
Chloride: 94 mmol/L — ABNORMAL LOW (ref 98–111)
Creatinine, Ser: 1.51 mg/dL — ABNORMAL HIGH (ref 0.61–1.24)
GFR, Estimated: 58 mL/min — ABNORMAL LOW
Glucose, Bld: 130 mg/dL — ABNORMAL HIGH (ref 70–99)
Potassium: 4.4 mmol/L (ref 3.5–5.1)
Sodium: 133 mmol/L — ABNORMAL LOW (ref 135–145)

## 2024-03-21 LAB — HEPARIN LEVEL (UNFRACTIONATED)
Heparin Unfractionated: 0.26 [IU]/mL — ABNORMAL LOW (ref 0.30–0.70)
Heparin Unfractionated: 0.27 [IU]/mL — ABNORMAL LOW (ref 0.30–0.70)

## 2024-03-21 LAB — GLUCOSE, CAPILLARY
Glucose-Capillary: 121 mg/dL — ABNORMAL HIGH (ref 70–99)
Glucose-Capillary: 163 mg/dL — ABNORMAL HIGH (ref 70–99)
Glucose-Capillary: 131 mg/dL — ABNORMAL HIGH (ref 70–99)
Glucose-Capillary: 161 mg/dL — ABNORMAL HIGH (ref 70–99)

## 2024-03-21 MED ORDER — WARFARIN SODIUM 2 MG PO TABS: 3.0000 mg | ORAL_TABLET | Freq: Once | ORAL | Status: DC

## 2024-03-21 MED ORDER — WARFARIN SODIUM 2 MG PO TABS: 4.0000 mg | ORAL_TABLET | Freq: Once | ORAL | Status: DC

## 2024-03-21 MED ORDER — SIMETHICONE 80 MG PO CHEW
80.0000 mg | CHEWABLE_TABLET | Freq: Four times a day (QID) | ORAL | Status: DC | PRN
  Administered 2024-03-21: 80 mg via ORAL
  Filled 2024-03-21: qty 1

## 2024-03-21 MED ORDER — WARFARIN SODIUM 2 MG PO TABS
4.0000 mg | ORAL_TABLET | Freq: Once | ORAL | Status: AC
  Administered 2024-03-21: 4 mg via ORAL
  Filled 2024-03-21: qty 2

## 2024-03-21 MED ORDER — WARFARIN SODIUM 2 MG PO TABS: 3.0000 mg | ORAL_TABLET | ORAL | Status: DC

## 2024-03-21 MED ORDER — WARFARIN SODIUM 2 MG PO TABS
4.0000 mg | ORAL_TABLET | ORAL | Status: AC
  Administered 2024-03-21: 4 mg via ORAL
  Filled 2024-03-21: qty 2

## 2024-03-21 MED ORDER — CYCLOBENZAPRINE HCL 10 MG PO TABS: 5.0000 mg | ORAL_TABLET | Freq: Once | ORAL | Status: DC

## 2024-03-21 MED ORDER — HEPARIN BOLUS VIA INFUSION
1300.0000 [IU] | Freq: Once | INTRAVENOUS | Status: AC
  Administered 2024-03-21: 1300 [IU] via INTRAVENOUS
  Filled 2024-03-21: qty 1300

## 2024-03-21 NOTE — Plan of Care (Signed)
  Problem: Activity: Goal: Risk for activity intolerance will decrease Outcome: Progressing   Problem: Nutrition: Goal: Adequate nutrition will be maintained Outcome: Progressing   Problem: Coping: Goal: Level of anxiety will decrease Outcome: Progressing   Problem: Elimination: Goal: Will not experience complications related to urinary retention Outcome: Progressing   Problem: Pain Managment: Goal: General experience of comfort will improve and/or be controlled Outcome: Progressing

## 2024-03-21 NOTE — Plan of Care (Signed)

## 2024-03-21 NOTE — Progress Notes (Signed)
 PHARMACY - ANTICOAGULATION CONSULT NOTE  Pharmacy Consult for Heparin  & Warfarin Indication: suspected renal embolism/aortic valve  Allergies[1]  Patient Measurements: Height: 5' 6 (167.6 cm) Weight: 103 kg (227 lb) IBW/kg (Calculated) : 63.8 HEPARIN  DW (KG): 86.7  Vital Signs: Temp: 99.8 F (37.7 C) (01/02 1936) Temp Source: Oral (01/02 1936) BP: 106/55 (01/02 1936) Pulse Rate: 83 (01/02 1936)  Labs: Recent Labs    03/19/24 0416 03/19/24 1336 03/20/24 0424 03/20/24 1314 03/21/24 0420 03/21/24 2044  HGB 13.0  --  14.6  --  12.7*  --   HCT 40.6  --  46.2  --  39.9  --   PLT 204  --  222  --  208  --   LABPROT 19.3*  --  19.9*  --  20.7*  --   INR 1.5*  --  1.6*  --  1.7*  --   HEPARINUNFRC 0.22*   < > 0.18* 0.38 0.26* 0.27*  CREATININE 1.09  --   --   --  1.51*  --    < > = values in this interval not displayed.    Estimated Creatinine Clearance: 69.5 mL/min (A) (by C-G formula based on SCr of 1.51 mg/dL (H)).   Medical History: Past Medical History:  Diagnosis Date   Anxiety    History of artificial heart valve    a. s/p aortic valvuloplasty at age 49 b. subsequent mechanical AVR at 44 yo c. 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   HTN (hypertension) 04/05/2023    Medications:  Medications Prior to Admission  Medication Sig Dispense Refill Last Dose/Taking   ALPRAZolam  (XANAX ) 1 MG tablet TAKE 1 TABLET(1 MG) BY MOUTH THREE TIMES DAILY AS NEEDED FOR ANXIETY (Patient taking differently: Take 1 mg by mouth 3 (three) times daily. TAKE 1 TABLET(1 MG) BY MOUTH THREE TIMES DAILY AS NEEDED FOR ANXIETY) 90 tablet 2 Taking Differently   Buprenorphine HCl-Naloxone HCl 8-2 MG FILM Place 1 Film under the tongue 3 (three) times daily.   03/17/2024 Bedtime   cyclobenzaprine (FLEXERIL) 5 MG tablet Take 5 mg by mouth at bedtime as needed.   Unknown   empagliflozin  (JARDIANCE ) 10 MG TABS tablet  Take 1 tablet (10 mg total) by mouth daily before breakfast. 90 tablet 3 03/17/2024   escitalopram  (LEXAPRO ) 10 MG tablet TAKE 1 TABLET(10 MG) BY MOUTH DAILY 90 tablet 0 03/17/2024   furosemide  (LASIX ) 40 MG tablet Take 40 mg (1 tablet) daily alternating with 80 mg (2 tablets) daily 405 tablet 3 03/17/2024   lisinopril  (ZESTRIL ) 20 MG tablet Take 20 mg by mouth daily.   03/17/2024   omeprazole  (PRILOSEC) 40 MG capsule Take 1 capsule (40 mg total) by mouth 2 (two) times daily. 60 capsule 1 03/17/2024   warfarin (COUMADIN ) 2 MG tablet Take 1 tablet (2 mg total) by mouth daily. (Patient taking differently: Take 2-3 mg by mouth See admin instructions. Take 3mg  by mouth every Tuesday, Thursday, Saturday. Take 2mg  by mouth all other days.) 30 tablet 5 03/17/2024 at 10:30 PM   ascorbic acid (VITAMIN C ) 500 MG tablet Take 1 tablet (500 mg total) by mouth daily with breakfast. (Patient not taking: Reported on 03/18/2024) 30 tablet 1 Not Taking   spironolactone  (ALDACTONE ) 25 MG tablet Take 1 tablet (25 mg total) by mouth daily. (Patient not taking: Reported on 03/18/2024) 90 tablet 3 Not Taking   Scheduled:   ascorbic acid  500 mg Oral Q breakfast   docusate sodium   100 mg Oral BID   empagliflozin   10 mg Oral QAC breakfast   escitalopram   10 mg Oral Daily   furosemide   40 mg Oral Daily   insulin  aspart  0-15 Units Subcutaneous TID WC   lisinopril   20 mg Oral QPM   pantoprazole   40 mg Oral Daily   polyethylene glycol  17 g Oral Daily   spironolactone   25 mg Oral Daily   Warfarin - Pharmacist Dosing Inpatient   Does not apply q1600   Infusions:   heparin  1,900 Units/hr (03/21/24 1353)   PRN:  Anti-infectives (From admission, onward)    None       Assessment: Patient is a 44 yo M presenting with right lower back pain radiating around to right groin. PMH significant for hypertension, paroxysmal a fib on Coumadin , history of CHF, history of ascending aortic aneurysm with surgical repair, and  GERD. CT findings are concerning for a suspected renal embolism. Patient is on Warfarin at home for Afib.  Pharmacy has been consulted for Warfarin and Heparin  bridging due to low INR. Patient is taking Warfarin 3 mg on TTS and 2 mg on all other days PTA. Patient's last Warfarin dose (2 mg) was last night on 03/18/24.   Goal of Therapy:  INR 2-3 Anti-Xa 0.3-0.7 Monitor platelets by anticoagulation protocol: Yes  Plan:  Heparin  level remains subtherapeutic at 0.27 Give heparin  bolus of 1300 units x1 Increase heparin  infusion to 2050 units/hour Check heparin  level 6 hours after rate increase Check CBC daily  Thank you for involving pharmacy in this patient's care.   Damien Napoleon, PharmD Clinical Pharmacist 03/21/2024 9:19 PM    [1] No Known Allergies

## 2024-03-21 NOTE — Progress Notes (Signed)
 PHARMACY - ANTICOAGULATION CONSULT NOTE  Pharmacy Consult for Heparin  & Warfarin Indication: suspected renal embolism  Allergies[1]  Patient Measurements: Height: 5' 6 (167.6 cm) Weight: 103 kg (227 lb) IBW/kg (Calculated) : 63.8 HEPARIN  DW (KG): 86.7  Vital Signs: Temp: 99.7 F (37.6 C) (01/01 2009) Temp Source: Oral (01/01 2009) BP: 109/78 (01/01 2009) Pulse Rate: 80 (01/01 2009)  Labs: Recent Labs    03/18/24 1202 03/19/24 0416 03/19/24 1336 03/19/24 2010 03/20/24 0424 03/20/24 1314  HGB 13.3 13.0  --   --  14.6  --   HCT 41.4 40.6  --   --  46.2  --   PLT 254 204  --   --  222  --   LABPROT 18.1* 19.3*  --   --  19.9*  --   INR 1.4* 1.5*  --   --  1.6*  --   HEPARINUNFRC  --  0.22*   < > 0.40 0.18* 0.38  CREATININE 1.25* 1.09  --   --   --   --    < > = values in this interval not displayed.    Estimated Creatinine Clearance: 96.2 mL/min (by C-G formula based on SCr of 1.09 mg/dL).   Medical History: Past Medical History:  Diagnosis Date   Anxiety    History of artificial heart valve    a. s/p aortic valvuloplasty at age 19 b. subsequent mechanical AVR at 44 yo c. 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   HTN (hypertension) 04/05/2023    Medications:  Medications Prior to Admission  Medication Sig Dispense Refill Last Dose/Taking   ALPRAZolam  (XANAX ) 1 MG tablet TAKE 1 TABLET(1 MG) BY MOUTH THREE TIMES DAILY AS NEEDED FOR ANXIETY (Patient taking differently: Take 1 mg by mouth 3 (three) times daily. TAKE 1 TABLET(1 MG) BY MOUTH THREE TIMES DAILY AS NEEDED FOR ANXIETY) 90 tablet 2 Taking Differently   Buprenorphine HCl-Naloxone HCl 8-2 MG FILM Place 1 Film under the tongue 3 (three) times daily.   03/17/2024 Bedtime   cyclobenzaprine (FLEXERIL) 5 MG tablet Take 5 mg by mouth at bedtime as needed.   Unknown   empagliflozin  (JARDIANCE ) 10 MG TABS tablet Take 1 tablet (10 mg  total) by mouth daily before breakfast. 90 tablet 3 03/17/2024   escitalopram  (LEXAPRO ) 10 MG tablet TAKE 1 TABLET(10 MG) BY MOUTH DAILY 90 tablet 0 03/17/2024   furosemide  (LASIX ) 40 MG tablet Take 40 mg (1 tablet) daily alternating with 80 mg (2 tablets) daily 405 tablet 3 03/17/2024   lisinopril  (ZESTRIL ) 20 MG tablet Take 20 mg by mouth daily.   03/17/2024   omeprazole  (PRILOSEC) 40 MG capsule Take 1 capsule (40 mg total) by mouth 2 (two) times daily. 60 capsule 1 03/17/2024   warfarin (COUMADIN ) 2 MG tablet Take 1 tablet (2 mg total) by mouth daily. (Patient taking differently: Take 2-3 mg by mouth See admin instructions. Take 3mg  by mouth every Tuesday, Thursday, Saturday. Take 2mg  by mouth all other days.) 30 tablet 5 03/17/2024 at 10:30 PM   ascorbic acid (VITAMIN C ) 500 MG tablet Take 1 tablet (500 mg total) by mouth daily with breakfast. (Patient not taking: Reported on 03/18/2024) 30 tablet 1 Not Taking   spironolactone  (ALDACTONE ) 25 MG tablet Take 1 tablet (25 mg total) by mouth daily. (Patient not taking: Reported on 03/18/2024) 90 tablet 3 Not Taking   Scheduled:   ascorbic acid  500  mg Oral Q breakfast   docusate sodium   100 mg Oral BID   empagliflozin   10 mg Oral QAC breakfast   escitalopram   10 mg Oral Daily   furosemide   40 mg Oral Daily   insulin  aspart  0-15 Units Subcutaneous TID WC   lisinopril   20 mg Oral QPM   pantoprazole   40 mg Oral Daily   polyethylene glycol  17 g Oral Daily   spironolactone   25 mg Oral Daily   warfarin  2 mg Oral Once per day on Sunday Monday Wednesday Friday   [START ON 03/22/2024] warfarin  3 mg Oral Once per day on Tuesday Thursday Saturday   warfarin  4 mg Oral NOW   Warfarin - Pharmacist Dosing Inpatient   Does not apply q1600   Infusions:   heparin  1,750 Units/hr (03/20/24 2002)   PRN:  Anti-infectives (From admission, onward)    None       Assessment: Patient is a 44 yo M presenting with right lower back pain radiating around to  right groin. PMH significant for hypertension, paroxysmal a fib on Coumadin , history of CHF, history of ascending aortic aneurysm with surgical repair, and GERD. CT findings are concerning for a suspected renal embolism. Patient is on Warfarin at home for Afib.  Pharmacy has been consulted for Warfarin and Heparin  bridging due to low INR. Patient is taking Warfarin 3 mg on TTS and 2 mg on all other days PTA. Patient's last Warfarin dose (2 mg) was last night on 03/18/24.   Baseline CBC w/ Hgb 13.3 and PLTs 254 Baseline INR 1.4  Goal of Therapy:  INR 2-3 Anti-Xa 0.3-0.7 Monitor platelets by anticoagulation protocol: Yes  Heparin  level this AM is below goal range at 0.18, no overt bleeding or complications noted, no known issues with IV infusion.   Plan:  Increase heparin  infusion to 1750 units/hr Recheck heparin  level in 8 hrs. Heparin  level daily Continue to monitor H&H and platelets  1/1:  RN messaged to say pt had not had dose of warfarin today.   - INR 1/01 @ 0424 = 1.6, SUBtherapeutic - will order warfarin 4 mg PO X 1 for now and resume home warfarin dose on 1/2 evening  - check INR daily   Zuha Dejonge D   [facility] pharmacy phone numbers are listed on [email or web address]        [1] No Known Allergies

## 2024-03-21 NOTE — Progress Notes (Signed)
 " PROGRESS NOTE    Chase Lee  FMW:[id number] DOB: [birth year] DOA: 03/18/2024  PCP: [facility]   Chief Complaint  Patient presents with   Flank Pain    Brief Narrative:  As per H&P.  By Dr. Manfred on 03/18/2024  Chase Lee is a 44 y.o. 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  Pain progressed through the night and was rated 10/10 on pain scale.  This was associated with nausea or vomiting which started this morning.   ED course In the emergency department, he was hemodynamically stable.  BP was 153/94.  Workup in ED showed normal CBC and BMP except for blood glucose of 167, creatinine 1.25, AST 88, ALT 57.  Urinalysis was positive for glycosuria. CT abdomen and pelvis with contrast showed loss of perfusion to the upper pole of the right kidney concerning for renal infarct. She was treated with Dilaudid , morphine  and Zofran .  Heparin  drip was started.  TRH was asked to admit patient. Dr. Norine with renal was consulted and recommended admitting patient for embolic workup.  Urology involvement was suggested.  Assessment & Plan: 1-acute renal infarction - CT abdomen and pelvis demonstrated loss of perfusion to the upper pole of the right kidney concerning for renal infarct. - At time of admission patient warfarin level was subtherapeutic most likely increasing the chances for thrombi - Continue IV heparin  drip and the use of  Coumadin  (dosed per pharmacy) - Maintain adequate hydration - Continue analgesics as needed. - Continue to follow clinical response.  2-subtherapeutic INR/acquired thrombophilia - We will continue management with heparin  and Coumadin  - Follow INR levels for stability; goal is for INR between 2 and 3. - INR is up to 1.7 today - Continue to follow assistance and recommendation on Coumadin  dosage by pharmacy service.  3-chronic diastolic heart failure - Appears to be stable and compensated - Will continue the use of Lasix , Aldactone , lisinopril  and Jardiance  - Continue to follow daily weights/strict intake and output and low-sodium diet - Recent echo in November 2025 demonstrating preserved ejection fraction and grade 2 diastolic dysfunction.  4-GERD - Continue PPI.  5-type 2 diabetes mellitus - A1c 6.6 - Follow CBG fluctuation and adjust hypoglycemic regimen as needed.  6-essential hypertension - Continue current antihypertensive agents - Follow vital signs.  7-paroxysmal atrial fibrillation - Continue management with anticoagulation therapy. - Rate control and currently sinus. - Patient is currently on heparin  drip while achieving therapeutic INR levels.  8-GAD/depression - No suicidal ideation or hallucination - Continue current anxiolytic and antidepressant management.  9-obesity class II -Body mass index is 36.64 kg/m.  -Low-calorie diet and portion control discussed with patient.  10-constipation/obstipation - Continue the use of MiraLAX  and Colace - Patient advised to maintain adequate hydration and increase physical activity. - As needed simethicone  has been added to patient's medication regimen.    DVT prophylaxis: IV heparin . Code Status: Full code. Family Communication: No family at bedside. Disposition:   Status is: Inpatient Remains inpatient appropriate because: Continue  IV heparin .   Consultants:  Nephrology service.  Procedures:  See below for  x-ray reports.  Antimicrobials:  None   Subjective: No fever, no chest pain, no nausea, no vomiting.  Overall feels better.  Reporting good urine output.  Objective: Vitals:   03/20/24 0800 03/20/24 1300 03/20/24 2009 03/21/24 0431  BP: 105/73 123/72 109/78 112/70  Pulse:  83 80 70  Resp:   18 17  Temp:  98.6 F (37 C) 99.7 F (37.6 C) 99 F (37.2 C)  TempSrc:  Oral Oral Oral  SpO2:  98% 98% 95%  Weight:      Height:        Intake/Output Summary (Last 24 hours) at 03/21/2024 1258 Last data filed at 03/21/2024 0913 Gross per 24 hour  Intake 602.53 ml  Output --  Net 602.53 ml   Filed Weights   03/18/24 1112 03/18/24 2038  Weight: 103.4 kg 103 kg    Examination: General exam: Alert, awake, oriented x 3 Respiratory system: Clear to auscultation. Respiratory effort normal.  Good saturation on room air. Cardiovascular system:RRR. No murmurs, rubs, gallops. Gastrointestinal system: Abdomen is obese, nondistended, soft and mildly tender to palpation on the right flank.  Positive bowel sounds. Central nervous system: No focal neurological deficits. Extremities: No cyanosis or clubbing. Skin: No petechiae. Psychiatry: Judgement and insight appear normal. Mood & affect appropriate.   Data Reviewed: I have personally reviewed following labs and imaging studies  CBC: Recent Labs  Lab 03/18/24 1202 03/19/24 0416 03/20/24 0424 03/21/24 0420  WBC 9.8 9.7 11.4* 12.6*  NEUTROABS 8.3*  --   --   --   HGB 13.3 13.0 14.6 12.7*  HCT 41.4 40.6 46.2 39.9  MCV 81.7 84.2 83.8 83.5  PLT 254 204 222 208    Basic Metabolic Panel: Recent Labs  Lab 03/18/24 1202 03/19/24 0416 03/21/24 0420  NA 135 135 133*  K 4.5 4.2 4.4  CL 98 98 94*  CO2 23 21* 32  GLUCOSE 167* 160* 130*  BUN 17 15 17   CREATININE 1.25* 1.09 1.51*  CALCIUM  9.2 8.9 9.0  MG  --  2.3  --   PHOS  --  3.0  --     GFR: Estimated Creatinine Clearance: 69.5 mL/min (A) (by C-G formula based on SCr of 1.51  mg/dL (H)).  Liver Function Tests: Recent Labs  Lab 03/18/24 1202 03/19/24 0416  AST 88* 100*  ALT 57* 65*  ALKPHOS 89 107  BILITOT 0.4 0.6  PROT 7.4 7.2  ALBUMIN  4.7 4.6    CBG: Recent Labs  Lab 03/20/24 1111 03/20/24 1627 03/20/24 2128 03/21/24 0715 03/21/24 1124  GLUCAP 219* 131* 129* 121* 163*   Radiology Studies: No results found.   Scheduled Meds:  ascorbic acid  500 mg Oral Q breakfast   docusate sodium   100 mg Oral BID   empagliflozin   10 mg Oral QAC breakfast   escitalopram   10 mg Oral Daily   furosemide   40 mg Oral Daily   insulin  aspart  0-15 Units Subcutaneous TID WC   lisinopril   20 mg Oral QPM   pantoprazole   40 mg Oral Daily   polyethylene glycol  17 g Oral Daily   spironolactone   25 mg Oral Daily   warfarin  4 mg Oral ONCE-1600   Warfarin - Pharmacist Dosing Inpatient   Does not apply q1600   Continuous Infusions:  heparin  1,750 Units/hr (03/20/24 2002)     LOS: 3 days    Time  spent: 50 minutes  Eric Nunnery, MD [facility]   To contact the attending provider between 7A-7P or the covering provider during after hours 7P-7A, please log into the web site [email or web address] and access using universal [facility] password for that web site. If you do not have the password, please call the hospital operator.  03/21/2024, 12:58 PM    "

## 2024-03-21 NOTE — Progress Notes (Addendum)
 PHARMACY - ANTICOAGULATION CONSULT NOTE  Pharmacy Consult for Heparin  & Warfarin Indication: suspected renal embolism/aortic valve  Allergies[1]  Patient Measurements: Height: 5' 6 (167.6 cm) Weight: 103 kg (227 lb) IBW/kg (Calculated) : 63.8 HEPARIN  DW (KG): 86.7  Vital Signs: Temp: 99 F (37.2 C) (01/02 0431) Temp Source: Oral (01/02 0431) BP: 112/70 (01/02 0431) Pulse Rate: 70 (01/02 0431)  Labs: Recent Labs    03/19/24 0416 03/19/24 1336 03/20/24 0424 03/20/24 1314 03/21/24 0420  HGB 13.0  --  14.6  --  12.7*  HCT 40.6  --  46.2  --  39.9  PLT 204  --  222  --  208  LABPROT 19.3*  --  19.9*  --  20.7*  INR 1.5*  --  1.6*  --  1.7*  HEPARINUNFRC 0.22*   < > 0.18* 0.38 0.26*  CREATININE 1.09  --   --   --  1.51*   < > = values in this interval not displayed.    Estimated Creatinine Clearance: 69.5 mL/min (A) (by C-G formula based on SCr of 1.51 mg/dL (H)).   Medical History: Past Medical History:  Diagnosis Date   Anxiety    History of artificial heart valve    a. s/p aortic valvuloplasty at age 44 b. subsequent mechanical AVR at 44 yo c. 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   HTN (hypertension) 04/05/2023    Medications:  Medications Prior to Admission  Medication Sig Dispense Refill Last Dose/Taking   ALPRAZolam  (XANAX ) 1 MG tablet TAKE 1 TABLET(1 MG) BY MOUTH THREE TIMES DAILY AS NEEDED FOR ANXIETY (Patient taking differently: Take 1 mg by mouth 3 (three) times daily. TAKE 1 TABLET(1 MG) BY MOUTH THREE TIMES DAILY AS NEEDED FOR ANXIETY) 90 tablet 2 Taking Differently   Buprenorphine HCl-Naloxone HCl 8-2 MG FILM Place 1 Film under the tongue 3 (three) times daily.   03/17/2024 Bedtime   cyclobenzaprine (FLEXERIL) 5 MG tablet Take 5 mg by mouth at bedtime as needed.   Unknown   empagliflozin  (JARDIANCE ) 10 MG TABS tablet Take 1 tablet (10 mg total) by mouth daily before  breakfast. 90 tablet 3 03/17/2024   escitalopram  (LEXAPRO ) 10 MG tablet TAKE 1 TABLET(10 MG) BY MOUTH DAILY 90 tablet 0 03/17/2024   furosemide  (LASIX ) 40 MG tablet Take 40 mg (1 tablet) daily alternating with 80 mg (2 tablets) daily 405 tablet 3 03/17/2024   lisinopril  (ZESTRIL ) 20 MG tablet Take 20 mg by mouth daily.   03/17/2024   omeprazole  (PRILOSEC) 40 MG capsule Take 1 capsule (40 mg total) by mouth 2 (two) times daily. 60 capsule 1 03/17/2024   warfarin (COUMADIN ) 2 MG tablet Take 1 tablet (2 mg total) by mouth daily. (Patient taking differently: Take 2-3 mg by mouth See admin instructions. Take 3mg  by mouth every Tuesday, Thursday, Saturday. Take 2mg  by mouth all other days.) 30 tablet 5 03/17/2024 at 10:30 PM   ascorbic acid (VITAMIN C ) 500 MG tablet Take 1 tablet (500 mg total) by mouth daily with breakfast. (Patient not taking: Reported on 03/18/2024) 30 tablet 1 Not Taking   spironolactone  (ALDACTONE ) 25 MG tablet Take 1 tablet (25 mg total) by mouth daily. (Patient not taking: Reported on 03/18/2024) 90 tablet 3 Not Taking   Scheduled:   ascorbic acid  500 mg Oral Q breakfast   docusate sodium   100 mg Oral BID   empagliflozin   10 mg Oral  QAC breakfast   escitalopram   10 mg Oral Daily   furosemide   40 mg Oral Daily   insulin  aspart  0-15 Units Subcutaneous TID WC   lisinopril   20 mg Oral QPM   pantoprazole   40 mg Oral Daily   polyethylene glycol  17 g Oral Daily   spironolactone   25 mg Oral Daily   warfarin  4 mg Oral ONCE-1600   Warfarin - Pharmacist Dosing Inpatient   Does not apply q1600   Infusions:   heparin  1,750 Units/hr (03/20/24 2002)   PRN:  Anti-infectives (From admission, onward)    None       Assessment: Patient is a 44 yo M presenting with right lower back pain radiating around to right groin. PMH significant for hypertension, paroxysmal a fib on Coumadin , history of CHF, history of ascending aortic aneurysm with surgical repair, and GERD. CT findings are  concerning for a suspected renal embolism. Patient is on Warfarin at home for Afib.  Pharmacy has been consulted for Warfarin and Heparin  bridging due to low INR. Patient is taking Warfarin 3 mg on TTS and 2 mg on all other days PTA. Patient's last Warfarin dose (2 mg) was last night on 03/18/24.   HL 0.26- slightly subtherapeutic INR 1.7- last nights dose not ordered until ~ midnight so AM labs were probably not influenced by dose  Goal of Therapy:  INR 2-3 Anti-Xa 0.3-0.7 Monitor platelets by anticoagulation protocol: Yes    Plan:  Warfarin 4 mg x 1 dose Increase heparin  infusion to 1900 units/hr Recheck heparin  level in 8 hrs. Heparin  level daily Continue to monitor H&H and platelets  Elspeth Sour, PharmD Clinical Pharmacist 03/21/2024 1:28 PM        [1] No Known Allergies

## 2024-03-22 DIAGNOSIS — N280 Ischemia and infarction of kidney: Secondary | ICD-10-CM | POA: Diagnosis not present

## 2024-03-22 LAB — CBC
HCT: 37.7 % — ABNORMAL LOW (ref 39.0–52.0)
Hemoglobin: 12.1 g/dL — ABNORMAL LOW (ref 13.0–17.0)
MCH: 26.7 pg (ref 26.0–34.0)
MCHC: 32.1 g/dL (ref 30.0–36.0)
MCV: 83.2 fL (ref 80.0–100.0)
Platelets: 200 K/uL (ref 150–400)
RBC: 4.53 MIL/uL (ref 4.22–5.81)
RDW: 18.2 % — ABNORMAL HIGH (ref 11.5–15.5)
WBC: 11.6 K/uL — ABNORMAL HIGH (ref 4.0–10.5)
nRBC: 0 % (ref 0.0–0.2)

## 2024-03-22 LAB — GLUCOSE, CAPILLARY
Glucose-Capillary: 150 mg/dL — ABNORMAL HIGH (ref 70–99)
Glucose-Capillary: 162 mg/dL — ABNORMAL HIGH (ref 70–99)
Glucose-Capillary: 109 mg/dL — ABNORMAL HIGH (ref 70–99)
Glucose-Capillary: 161 mg/dL — ABNORMAL HIGH (ref 70–99)

## 2024-03-22 LAB — PROTIME-INR
INR: 1.8 — ABNORMAL HIGH (ref 0.8–1.2)
Prothrombin Time: 21.9 s — ABNORMAL HIGH (ref 11.4–15.2)

## 2024-03-22 LAB — HEPARIN LEVEL (UNFRACTIONATED)
Heparin Unfractionated: 0.4 [IU]/mL (ref 0.30–0.70)
Heparin Unfractionated: 0.37 [IU]/mL (ref 0.30–0.70)

## 2024-03-22 MED ORDER — WARFARIN SODIUM 7.5 MG PO TABS
7.5000 mg | ORAL_TABLET | Freq: Once | ORAL | Status: AC
  Administered 2024-03-22: 7.5 mg via ORAL
  Filled 2024-03-22: qty 1

## 2024-03-22 MED ORDER — BISACODYL 10 MG RE SUPP
10.0000 mg | Freq: Every day | RECTAL | Status: DC | PRN
  Filled 2024-03-22: qty 1

## 2024-03-22 MED ORDER — WARFARIN SODIUM 2 MG PO TABS: 4.0000 mg | ORAL_TABLET | Freq: Once | ORAL | Status: DC

## 2024-03-22 NOTE — Plan of Care (Signed)

## 2024-03-22 NOTE — Progress Notes (Signed)
 PHARMACY - ANTICOAGULATION CONSULT NOTE  Pharmacy Consult for Heparin  & Warfarin Indication: suspected renal embolism/aortic valve  Allergies[1]  Patient Measurements: Height: 5' 6 (167.6 cm) Weight: 103 kg (227 lb) IBW/kg (Calculated) : 63.8 HEPARIN  DW (KG): 86.7  Vital Signs: Temp: 98.7 F (37.1 C) (01/03 0421) Temp Source: Oral (01/03 0421) BP: 120/74 (01/03 0421) Pulse Rate: 69 (01/03 0421)  Labs: Recent Labs    03/20/24 0424 03/20/24 1314 03/21/24 0420 03/21/24 2044 03/22/24 0446  HGB 14.6  --  12.7*  --  12.1*  HCT 46.2  --  39.9  --  37.7*  PLT 222  --  208  --  200  LABPROT 19.9*  --  20.7*  --  21.9*  INR 1.6*  --  1.7*  --  1.8*  HEPARINUNFRC 0.18*   < > 0.26* 0.27* 0.40  CREATININE  --   --  1.51*  --   --    < > = values in this interval not displayed.    Estimated Creatinine Clearance: 69.5 mL/min (A) (by C-G formula based on SCr of 1.51 mg/dL (H)).   Medical History: Past Medical History:  Diagnosis Date   Anxiety    History of artificial heart valve    a. s/p aortic valvuloplasty at age 48 b. subsequent mechanical AVR at 44 yo c. 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   HTN (hypertension) 04/05/2023    Medications:  Medications Prior to Admission  Medication Sig Dispense Refill Last Dose/Taking   ALPRAZolam  (XANAX ) 1 MG tablet TAKE 1 TABLET(1 MG) BY MOUTH THREE TIMES DAILY AS NEEDED FOR ANXIETY (Patient taking differently: Take 1 mg by mouth 3 (three) times daily. TAKE 1 TABLET(1 MG) BY MOUTH THREE TIMES DAILY AS NEEDED FOR ANXIETY) 90 tablet 2 Taking Differently   Buprenorphine  HCl-Naloxone  HCl 8-2 MG FILM Place 1 Film under the tongue 3 (three) times daily.   03/17/2024 Bedtime   cyclobenzaprine  (FLEXERIL ) 5 MG tablet Take 5 mg by mouth at bedtime as needed.   Unknown   empagliflozin  (JARDIANCE ) 10 MG TABS tablet Take 1 tablet (10 mg total) by mouth daily  before breakfast. 90 tablet 3 03/17/2024   escitalopram  (LEXAPRO ) 10 MG tablet TAKE 1 TABLET(10 MG) BY MOUTH DAILY 90 tablet 0 03/17/2024   furosemide  (LASIX ) 40 MG tablet Take 40 mg (1 tablet) daily alternating with 80 mg (2 tablets) daily 405 tablet 3 03/17/2024   lisinopril  (ZESTRIL ) 20 MG tablet Take 20 mg by mouth daily.   03/17/2024   omeprazole  (PRILOSEC) 40 MG capsule Take 1 capsule (40 mg total) by mouth 2 (two) times daily. 60 capsule 1 03/17/2024   warfarin (COUMADIN ) 2 MG tablet Take 1 tablet (2 mg total) by mouth daily. (Patient taking differently: Take 2-3 mg by mouth See admin instructions. Take 3mg  by mouth every Tuesday, Thursday, Saturday. Take 2mg  by mouth all other days.) 30 tablet 5 03/17/2024 at 10:30 PM   ascorbic acid  (VITAMIN C ) 500 MG tablet Take 1 tablet (500 mg total) by mouth daily with breakfast. (Patient not taking: Reported on 03/18/2024) 30 tablet 1 Not Taking   spironolactone  (ALDACTONE ) 25 MG tablet Take 1 tablet (25 mg total) by mouth daily. (Patient not taking: Reported on 03/18/2024) 90 tablet 3 Not Taking   Scheduled:   ascorbic acid   500 mg Oral Q breakfast   docusate sodium   100 mg Oral BID   empagliflozin   10  mg Oral QAC breakfast   escitalopram   10 mg Oral Daily   furosemide   40 mg Oral Daily   insulin  aspart  0-15 Units Subcutaneous TID WC   lisinopril   20 mg Oral QPM   pantoprazole   40 mg Oral Daily   polyethylene glycol  17 g Oral Daily   spironolactone   25 mg Oral Daily   Warfarin - Pharmacist Dosing Inpatient   Does not apply q1600   Infusions:   heparin  2,050 Units/hr (03/22/24 0201)   PRN:  Anti-infectives (From admission, onward)    None       Assessment: Patient is a 44 yo M presenting with right lower back pain radiating around to right groin. PMH significant for hypertension, paroxysmal a fib on Coumadin , history of CHF, history of ascending aortic aneurysm with surgical repair, and GERD. CT findings are concerning for a  suspected renal embolism. Patient is on Warfarin at home for Afib.  Pharmacy has been consulted for Warfarin and Heparin  bridging due to low INR. Patient is taking Warfarin 3 mg on TTS and 2 mg on all other days PTA. Patient's last Warfarin dose (2 mg) was last night on 03/18/24.   HL 0.26> 0.4 therapeutic INR 1.7> 1.8  Goal of Therapy:  INR 2-3 Anti-Xa 0.3-0.7 Monitor platelets by anticoagulation protocol: Yes    Plan:  Warfarin 4 mg x 1 dose Continue heparin  infusion to 2050 units/hr Recheck heparin  level in 8 hrs. Heparin  level daily Continue to monitor H&H and platelets  Maryn Freelove, BS Pharm D, BCPS Clinical Pharmacist 03/22/2024 8:19 AM         [1] No Known Allergies

## 2024-03-22 NOTE — Progress Notes (Signed)
 PHARMACY - ANTICOAGULATION CONSULT NOTE  Pharmacy Consult for Heparin  & Warfarin Indication: suspected renal embolism/aortic valve  Allergies[1]  Patient Measurements: Height: 5' 6 (167.6 cm) Weight: 103 kg (227 lb) IBW/kg (Calculated) : 63.8 HEPARIN  DW (KG): 86.7  Vital Signs: Temp: 98.4 F (36.9 C) (01/03 1359) Temp Source: Oral (01/03 1359) BP: 105/67 (01/03 1359) Pulse Rate: 71 (01/03 1359)  Labs: Recent Labs    03/20/24 0424 03/20/24 1314 03/21/24 0420 03/21/24 2044 03/22/24 0446 03/22/24 1302  HGB 14.6  --  12.7*  --  12.1*  --   HCT 46.2  --  39.9  --  37.7*  --   PLT 222  --  208  --  200  --   LABPROT 19.9*  --  20.7*  --  21.9*  --   INR 1.6*  --  1.7*  --  1.8*  --   HEPARINUNFRC 0.18*   < > 0.26* 0.27* 0.40 0.37  CREATININE  --   --  1.51*  --   --   --    < > = values in this interval not displayed.    Estimated Creatinine Clearance: 69.5 mL/min (A) (by C-G formula based on SCr of 1.51 mg/dL (H)).   Medical History: Past Medical History:  Diagnosis Date   Anxiety    History of artificial heart valve    a. s/p aortic valvuloplasty at age 38 b. subsequent mechanical AVR at 44 yo c. 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   HTN (hypertension) 04/05/2023    Medications:  Medications Prior to Admission  Medication Sig Dispense Refill Last Dose/Taking   ALPRAZolam  (XANAX ) 1 MG tablet TAKE 1 TABLET(1 MG) BY MOUTH THREE TIMES DAILY AS NEEDED FOR ANXIETY (Patient taking differently: Take 1 mg by mouth 3 (three) times daily. TAKE 1 TABLET(1 MG) BY MOUTH THREE TIMES DAILY AS NEEDED FOR ANXIETY) 90 tablet 2 Taking Differently   Buprenorphine  HCl-Naloxone  HCl 8-2 MG FILM Place 1 Film under the tongue 3 (three) times daily.   03/17/2024 Bedtime   cyclobenzaprine  (FLEXERIL ) 5 MG tablet Take 5 mg by mouth at bedtime as needed.   Unknown   empagliflozin  (JARDIANCE ) 10 MG TABS  tablet Take 1 tablet (10 mg total) by mouth daily before breakfast. 90 tablet 3 03/17/2024   escitalopram  (LEXAPRO ) 10 MG tablet TAKE 1 TABLET(10 MG) BY MOUTH DAILY 90 tablet 0 03/17/2024   furosemide  (LASIX ) 40 MG tablet Take 40 mg (1 tablet) daily alternating with 80 mg (2 tablets) daily 405 tablet 3 03/17/2024   lisinopril  (ZESTRIL ) 20 MG tablet Take 20 mg by mouth daily.   03/17/2024   omeprazole  (PRILOSEC) 40 MG capsule Take 1 capsule (40 mg total) by mouth 2 (two) times daily. 60 capsule 1 03/17/2024   warfarin (COUMADIN ) 2 MG tablet Take 1 tablet (2 mg total) by mouth daily. (Patient taking differently: Take 2-3 mg by mouth See admin instructions. Take 3mg  by mouth every Tuesday, Thursday, Saturday. Take 2mg  by mouth all other days.) 30 tablet 5 03/17/2024 at 10:30 PM   ascorbic acid  (VITAMIN C ) 500 MG tablet Take 1 tablet (500 mg total) by mouth daily with breakfast. (Patient not taking: Reported on 03/18/2024) 30 tablet 1 Not Taking   spironolactone  (ALDACTONE ) 25 MG tablet Take 1 tablet (25 mg total) by mouth daily. (Patient not taking: Reported on 03/18/2024) 90 tablet 3 Not Taking   Scheduled:   ascorbic  acid  500 mg Oral Q breakfast   docusate sodium   100 mg Oral BID   empagliflozin   10 mg Oral QAC breakfast   escitalopram   10 mg Oral Daily   furosemide   40 mg Oral Daily   insulin  aspart  0-15 Units Subcutaneous TID WC   lisinopril   20 mg Oral QPM   pantoprazole   40 mg Oral Daily   polyethylene glycol  17 g Oral Daily   spironolactone   25 mg Oral Daily   warfarin  4 mg Oral ONCE-1600   Warfarin - Pharmacist Dosing Inpatient   Does not apply q1600   Infusions:   heparin  2,050 Units/hr (03/22/24 0201)   PRN:  Anti-infectives (From admission, onward)    None       Assessment: Patient is a 44 yo M presenting with right lower back pain radiating around to right groin. PMH significant for hypertension, paroxysmal a fib on Coumadin , history of CHF, history of ascending aortic  aneurysm with surgical repair, and GERD. CT findings are concerning for a suspected renal embolism. Patient is on Warfarin at home for Afib.  Pharmacy has been consulted for Warfarin and Heparin  bridging due to low INR. Patient is taking Warfarin 3 mg on TTS and 2 mg on all other days PTA. Patient's last Warfarin dose (2 mg) was last night on 03/18/24.   HL 0.26> 0.4> 0.37,  therapeutic. No issues with infusion INR 1.7> 1.8  Goal of Therapy:  INR 2-3 Anti-Xa 0.3-0.7 Monitor platelets by anticoagulation protocol: Yes    Plan:  Warfarin 4 mg x 1 dose Continue heparin  infusion to 2050 units/hr. Heparin  level daily Continue to monitor H&H and platelets  Nykayla Marcelli, BS Pharm D, BCPS Clinical Pharmacist 03/22/2024 2:26 PM          [1] No Known Allergies

## 2024-03-22 NOTE — Progress Notes (Signed)
 " PROGRESS NOTE    Chase Lee  FMW:[id number] DOB: Nov 02, 1980 DOA: 03/18/2024  PCP: [facility]   Chief Complaint  Patient presents with   Flank Pain    Brief Narrative:  As per H&P.  By Dr. Manfred on 03/18/2024  Chase Lee is a 44 y.o. 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  Pain progressed through the night and was rated 10/10 on pain scale.  This was associated with nausea or vomiting which started this morning.   ED course In the emergency department, he was hemodynamically stable.  BP was 153/94.  Workup in ED showed normal CBC and BMP except for blood glucose of 167, creatinine 1.25, AST 88, ALT 57.  Urinalysis was positive for glycosuria. CT abdomen and pelvis with contrast showed loss of perfusion to the upper pole of the right kidney concerning for renal infarct. She was treated with Dilaudid , morphine  and Zofran .  Heparin  drip was started.  TRH was asked to admit patient. Dr. Norine with renal was consulted and recommended admitting patient for embolic workup.  Urology involvement was suggested.  Assessment & Plan: 1-acute renal infarction - CT abdomen and pelvis demonstrated loss of perfusion to the upper pole of the right kidney concerning for renal infarct. - At time of admission patient warfarin level was subtherapeutic most likely increasing the chances for thrombi - Continue IV heparin  drip and the use of  Coumadin  (dosed per pharmacy) - Maintain adequate hydration - Continue analgesics as needed. - Continue to follow clinical response.  2-subtherapeutic INR/acquired thrombophilia - We will continue management with heparin  and Coumadin  - Follow INR levels for stability; goal is for INR between 2 and 3. - INR is up to 1.8 today - Continue to follow assistance and recommendation on Coumadin  dosage by pharmacy service. - Planning for 7.5 mg x 1 today.  3-chronic diastolic heart failure - Appears to be stable and compensated - Will continue the use of Lasix , Aldactone , lisinopril  and Jardiance  - Continue to follow daily weights/strict intake and output and low-sodium diet - Recent echo in November 2025 demonstrating preserved ejection fraction and grade 2 diastolic dysfunction.  4-GERD - Continue PPI.  5-type 2 diabetes mellitus - A1c 6.6 - Follow CBG fluctuation and adjust hypoglycemic regimen as needed.  6-essential hypertension - Continue current antihypertensive agents - Follow vital signs.  7-paroxysmal atrial fibrillation - Continue management with anticoagulation therapy. - Rate control and currently sinus. - Patient is currently on heparin  drip while achieving therapeutic INR levels.  8-GAD/depression - No suicidal ideation or hallucination - Continue current anxiolytic and antidepressant management.  9-obesity class II -Body mass index is 36.64 kg/m.  -Low-calorie diet and portion control discussed with patient.  10-constipation/obstipation - Continue the use of MiraLAX  and Colace - Patient advised to maintain adequate hydration and increase physical activity. -As needed Dulcolax suppository has been added. - As needed simethicone  has been added to patient's medication regimen.    DVT prophylaxis: IV heparin . Code Status: Full code. Family Communication:  No family at bedside. Disposition:   Status is: Inpatient Remains inpatient appropriate because:  Continue IV heparin .   Consultants:  Nephrology service.  Procedures:  See below for x-ray reports.  Antimicrobials:  None   Subjective: No fever, no chest pain, no nausea, no vomiting.  Patient denies overt bleeding.  Objective: Vitals:   03/21/24 1623 03/21/24 1936 03/22/24 0421 03/22/24 1359  BP: 100/64 (!) 106/55 120/74 105/67  Pulse: 73 83 69 71  Resp:  19 19   Temp:  99.8 F (37.7 C) 98.7 F (37.1 C) 98.4 F (36.9 C)  TempSrc:  Oral Oral Oral  SpO2:  98% 96% 96%  Weight:      Height:        Intake/Output Summary (Last 24 hours) at 03/22/2024 1519 Last data filed at 03/22/2024 1300 Gross per 24 hour  Intake 600 ml  Output --  Net 600 ml   Filed Weights   03/18/24 1112 03/18/24 2038  Weight: 103.4 kg 103 kg    Examination: General exam: Alert, awake, oriented x 3; reporting intermittent abdominal pain. Respiratory system: Clear to auscultation. Respiratory effort normal.  Good saturation on room air. Cardiovascular system:RRR. No murmurs, rubs, gallops. Gastrointestinal system: Abdomen is obese, nondistended, soft and without guarding; positive bowel sounds. Central nervous system:No focal neurological deficits. Extremities: No cyanosis or clubbing. Skin: No petechiae. Psychiatry: Judgement and insight appear normal. Mood & affect appropriate.   Data Reviewed: I have personally reviewed following labs and imaging studies  CBC: Recent Labs  Lab 03/18/24 1202 03/19/24 0416 03/20/24 0424 03/21/24 0420 03/22/24 0446  WBC 9.8 9.7 11.4* 12.6* 11.6*  NEUTROABS 8.3*  --   --   --   --   HGB 13.3 13.0 14.6 12.7* 12.1*  HCT 41.4 40.6 46.2 39.9 37.7*  MCV 81.7 84.2 83.8 83.5 83.2  PLT 254 204 222 208 200    Basic Metabolic Panel: Recent Labs  Lab 03/18/24 1202 03/19/24 0416 03/21/24 0420  NA 135 135 133*  K 4.5 4.2 4.4  CL 98 98 94*  CO2 23 21* 32  GLUCOSE 167* 160* 130*  BUN 17 15 17   CREATININE 1.25* 1.09 1.51*  CALCIUM  9.2 8.9 9.0  MG  --   2.3  --   PHOS  --  3.0  --     GFR: Estimated Creatinine Clearance: 69.5 mL/min (A) (by C-G formula based on SCr of 1.51 mg/dL (H)).  Liver Function Tests: Recent Labs  Lab 03/18/24 1202 03/19/24 0416  AST 88* 100*  ALT 57* 65*  ALKPHOS 89 107  BILITOT 0.4 0.6  PROT 7.4 7.2  ALBUMIN  4.7 4.6    CBG: Recent Labs  Lab 03/21/24 1124 03/21/24 1610 03/21/24 2152 03/22/24 0741 03/22/24 1126  GLUCAP 163* 131* 161* 150* 162*   Radiology Studies: No results found.   Scheduled Meds:  ascorbic acid   500 mg Oral Q breakfast   docusate sodium   100 mg Oral BID   empagliflozin   10 mg Oral QAC breakfast   escitalopram   10 mg Oral Daily   furosemide   40 mg Oral Daily   insulin  aspart  0-15 Units Subcutaneous TID WC   lisinopril   20 mg Oral QPM   pantoprazole   40 mg Oral Daily   polyethylene glycol  17 g Oral Daily   spironolactone   25 mg Oral Daily   Warfarin - Pharmacist Dosing Inpatient   Does not apply q1600   Continuous Infusions:  heparin  2,050 Units/hr (03/22/24 0201)  LOS: 4 days    Time spent: 50 minutes  Eric Nunnery, MD [facility]   To contact the attending provider between 7A-7P or the covering provider during after hours 7P-7A, please log into the web site [email or web address] and access using universal [place] password for that web site. If you do not have the password, please call the hospital operator.  03/22/2024, 3:19 PM    "

## 2024-03-22 NOTE — Progress Notes (Addendum)
 PHARMACY - ANTICOAGULATION CONSULT NOTE  Pharmacy Consult for Heparin   Indication: suspected renal embolism/aortic valve  Allergies[1]  Patient Measurements: Height: 5' 6 (167.6 cm) Weight: 103 kg (227 lb) IBW/kg (Calculated) : 63.8 HEPARIN  DW (KG): 86.7  Vital Signs: Temp: 98.7 F (37.1 C) (01/03 0421) Temp Source: Oral (01/03 0421) BP: 120/74 (01/03 0421) Pulse Rate: 69 (01/03 0421)  Labs: Recent Labs    03/20/24 0424 03/20/24 1314 03/21/24 0420 03/21/24 2044 03/22/24 0446  HGB 14.6  --  12.7*  --  12.1*  HCT 46.2  --  39.9  --  37.7*  PLT 222  --  208  --  200  LABPROT 19.9*  --  20.7*  --   --   INR 1.6*  --  1.7*  --   --   HEPARINUNFRC 0.18*   < > 0.26* 0.27* 0.40  CREATININE  --   --  1.51*  --   --    < > = values in this interval not displayed.    Estimated Creatinine Clearance: 69.5 mL/min (A) (by C-G formula based on SCr of 1.51 mg/dL (H)).   Medical History: Past Medical History:  Diagnosis Date   Anxiety    History of artificial heart valve    a. s/p aortic valvuloplasty at age 24 b. subsequent mechanical AVR at 44 yo c. 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   HTN (hypertension) 04/05/2023    Medications:  Medications Prior to Admission  Medication Sig Dispense Refill Last Dose/Taking   ALPRAZolam  (XANAX ) 1 MG tablet TAKE 1 TABLET(1 MG) BY MOUTH THREE TIMES DAILY AS NEEDED FOR ANXIETY (Patient taking differently: Take 1 mg by mouth 3 (three) times daily. TAKE 1 TABLET(1 MG) BY MOUTH THREE TIMES DAILY AS NEEDED FOR ANXIETY) 90 tablet 2 Taking Differently   Buprenorphine  HCl-Naloxone  HCl 8-2 MG FILM Place 1 Film under the tongue 3 (three) times daily.   03/17/2024 Bedtime   cyclobenzaprine  (FLEXERIL ) 5 MG tablet Take 5 mg by mouth at bedtime as needed.   Unknown   empagliflozin  (JARDIANCE ) 10 MG TABS tablet Take 1 tablet (10 mg total) by mouth daily before  breakfast. 90 tablet 3 03/17/2024   escitalopram  (LEXAPRO ) 10 MG tablet TAKE 1 TABLET(10 MG) BY MOUTH DAILY 90 tablet 0 03/17/2024   furosemide  (LASIX ) 40 MG tablet Take 40 mg (1 tablet) daily alternating with 80 mg (2 tablets) daily 405 tablet 3 03/17/2024   lisinopril  (ZESTRIL ) 20 MG tablet Take 20 mg by mouth daily.   03/17/2024   omeprazole  (PRILOSEC) 40 MG capsule Take 1 capsule (40 mg total) by mouth 2 (two) times daily. 60 capsule 1 03/17/2024   warfarin (COUMADIN ) 2 MG tablet Take 1 tablet (2 mg total) by mouth daily. (Patient taking differently: Take 2-3 mg by mouth See admin instructions. Take 3mg  by mouth every Tuesday, Thursday, Saturday. Take 2mg  by mouth all other days.) 30 tablet 5 03/17/2024 at 10:30 PM   ascorbic acid  (VITAMIN C ) 500 MG tablet Take 1 tablet (500 mg total) by mouth daily with breakfast. (Patient not taking: Reported on 03/18/2024) 30 tablet 1 Not Taking   spironolactone  (ALDACTONE ) 25 MG tablet Take 1 tablet (25 mg total) by mouth daily. (Patient not taking: Reported on 03/18/2024) 90 tablet 3 Not Taking   Scheduled:   ascorbic acid   500 mg Oral Q breakfast   docusate sodium   100 mg Oral BID  empagliflozin   10 mg Oral QAC breakfast   escitalopram   10 mg Oral Daily   furosemide   40 mg Oral Daily   insulin  aspart  0-15 Units Subcutaneous TID WC   lisinopril   20 mg Oral QPM   pantoprazole   40 mg Oral Daily   polyethylene glycol  17 g Oral Daily   spironolactone   25 mg Oral Daily   Warfarin - Pharmacist Dosing Inpatient   Does not apply q1600   Infusions:   heparin  2,050 Units/hr (03/22/24 0201)   PRN:  Anti-infectives (From admission, onward)    None       Assessment: Patient is a 44 yo M presenting with right lower back pain radiating around to right groin. PMH significant for hypertension, paroxysmal a fib on Coumadin , history of CHF, history of ascending aortic aneurysm with surgical repair, and GERD. CT findings are concerning for a suspected renal  embolism. Patient is on Warfarin at home for Afib.  PTA warfarin dosing is 2 mg daily except 3 mg on Tues, Thurs and Sat.  Pharmacy has been consulted for Warfarin and Heparin  bridging due to low INR. Patient is taking Warfarin 3 mg on TTS and 2 mg on all other days PTA. Patient's last Warfarin dose (2 mg) was last night on 03/18/24.   Heparin  level this AM is within goal range at 0.4.    No overt bleeding or complications noted, CBC is stable.   Goal of Therapy:  Anti-Xa 0.3-0.7 Monitor platelets by anticoagulation protocol: Yes  Plan:  Continue IV heparin  at 2050 units/hr Daily heparin  level and CBC. Daily INR.   Thank you for involving pharmacy in this patient's care.   Harlene Barlow, [employer], BCCP Clinical Pharmacist  03/22/2024 6:10 AM   [facility] pharmacy phone numbers are listed on [email or web address]      [1] No Known Allergies

## 2024-03-23 DIAGNOSIS — N280 Ischemia and infarction of kidney: Secondary | ICD-10-CM | POA: Diagnosis not present

## 2024-03-23 LAB — CBC
HCT: 38.1 % — ABNORMAL LOW (ref 39.0–52.0)
Hemoglobin: 12.1 g/dL — ABNORMAL LOW (ref 13.0–17.0)
MCH: 26.7 pg (ref 26.0–34.0)
MCHC: 31.8 g/dL (ref 30.0–36.0)
MCV: 83.9 fL (ref 80.0–100.0)
Platelets: 223 K/uL (ref 150–400)
RBC: 4.54 MIL/uL (ref 4.22–5.81)
RDW: 18.4 % — ABNORMAL HIGH (ref 11.5–15.5)
WBC: 11.8 K/uL — ABNORMAL HIGH (ref 4.0–10.5)
nRBC: 0 % (ref 0.0–0.2)

## 2024-03-23 LAB — GLUCOSE, CAPILLARY
Glucose-Capillary: 178 mg/dL — ABNORMAL HIGH (ref 70–99)
Glucose-Capillary: 197 mg/dL — ABNORMAL HIGH (ref 70–99)
Glucose-Capillary: 171 mg/dL — ABNORMAL HIGH (ref 70–99)
Glucose-Capillary: 172 mg/dL — ABNORMAL HIGH (ref 70–99)

## 2024-03-23 LAB — PROTIME-INR
INR: 2.2 — ABNORMAL HIGH (ref 0.8–1.2)
Prothrombin Time: 25.5 s — ABNORMAL HIGH (ref 11.4–15.2)

## 2024-03-23 LAB — HEPARIN LEVEL (UNFRACTIONATED): Heparin Unfractionated: 0.29 [IU]/mL — ABNORMAL LOW (ref 0.30–0.70)

## 2024-03-23 MED ORDER — WARFARIN SODIUM 5 MG PO TABS
5.0000 mg | ORAL_TABLET | Freq: Once | ORAL | Status: AC
  Administered 2024-03-23: 5 mg via ORAL
  Filled 2024-03-23: qty 1

## 2024-03-23 NOTE — Plan of Care (Signed)
  Problem: Education: Goal: Knowledge of General Education information will improve Description: Including pain rating scale, medication(s)/side effects and non-pharmacologic comfort measures Outcome: Progressing   Problem: Clinical Measurements: Goal: Ability to maintain clinical measurements within normal limits will improve Outcome: Progressing   Problem: Clinical Measurements: Goal: Diagnostic test results will improve Outcome: Progressing   Problem: Activity: Goal: Risk for activity intolerance will decrease Outcome: Progressing

## 2024-03-23 NOTE — Plan of Care (Signed)

## 2024-03-23 NOTE — Progress Notes (Addendum)
 PHARMACY - ANTICOAGULATION CONSULT NOTE  Pharmacy Consult for Heparin  & Warfarin Indication: suspected renal embolism/aortic valve  Allergies[1]  Patient Measurements: Height: 5' 6 (167.6 cm) Weight: 103 kg (227 lb) IBW/kg (Calculated) : 63.8 HEPARIN  DW (KG): 86.7  Vital Signs: Temp: 99.3 F (37.4 C) (01/04 0509) Temp Source: Oral (01/04 0509) BP: 99/57 (01/04 0509) Pulse Rate: 71 (01/04 0509)  Labs: Recent Labs    03/21/24 0420 03/21/24 2044 03/22/24 0446 03/22/24 1302 03/23/24 0525  HGB 12.7*  --  12.1*  --  12.1*  HCT 39.9  --  37.7*  --  38.1*  PLT 208  --  200  --  223  LABPROT 20.7*  --  21.9*  --  25.5*  INR 1.7*  --  1.8*  --  2.2*  HEPARINUNFRC 0.26*   < > 0.40 0.37 0.29*  CREATININE 1.51*  --   --   --   --    < > = values in this interval not displayed.    Estimated Creatinine Clearance: 69.5 mL/min (A) (by C-G formula based on SCr of 1.51 mg/dL (H)).   Medical History: Past Medical History:  Diagnosis Date   Anxiety    History of artificial heart valve    a. s/p aortic valvuloplasty at age 71 b. subsequent mechanical AVR at 44 yo c. 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   HTN (hypertension) 04/05/2023    Medications:  Medications Prior to Admission  Medication Sig Dispense Refill Last Dose/Taking   ALPRAZolam  (XANAX ) 1 MG tablet TAKE 1 TABLET(1 MG) BY MOUTH THREE TIMES DAILY AS NEEDED FOR ANXIETY (Patient taking differently: Take 1 mg by mouth 3 (three) times daily. TAKE 1 TABLET(1 MG) BY MOUTH THREE TIMES DAILY AS NEEDED FOR ANXIETY) 90 tablet 2 Taking Differently   Buprenorphine  HCl-Naloxone  HCl 8-2 MG FILM Place 1 Film under the tongue 3 (three) times daily.   03/17/2024 Bedtime   cyclobenzaprine  (FLEXERIL ) 5 MG tablet Take 5 mg by mouth at bedtime as needed.   Unknown   empagliflozin  (JARDIANCE ) 10 MG TABS tablet Take 1 tablet (10 mg total) by mouth daily  before breakfast. 90 tablet 3 03/17/2024   escitalopram  (LEXAPRO ) 10 MG tablet TAKE 1 TABLET(10 MG) BY MOUTH DAILY 90 tablet 0 03/17/2024   furosemide  (LASIX ) 40 MG tablet Take 40 mg (1 tablet) daily alternating with 80 mg (2 tablets) daily 405 tablet 3 03/17/2024   lisinopril  (ZESTRIL ) 20 MG tablet Take 20 mg by mouth daily.   03/17/2024   omeprazole  (PRILOSEC) 40 MG capsule Take 1 capsule (40 mg total) by mouth 2 (two) times daily. 60 capsule 1 03/17/2024   warfarin (COUMADIN ) 2 MG tablet Take 1 tablet (2 mg total) by mouth daily. (Patient taking differently: Take 2-3 mg by mouth See admin instructions. Take 3mg  by mouth every Tuesday, Thursday, Saturday. Take 2mg  by mouth all other days.) 30 tablet 5 03/17/2024 at 10:30 PM   ascorbic acid  (VITAMIN C ) 500 MG tablet Take 1 tablet (500 mg total) by mouth daily with breakfast. (Patient not taking: Reported on 03/18/2024) 30 tablet 1 Not Taking   spironolactone  (ALDACTONE ) 25 MG tablet Take 1 tablet (25 mg total) by mouth daily. (Patient not taking: Reported on 03/18/2024) 90 tablet 3 Not Taking   Scheduled:   ascorbic acid   500 mg Oral Q breakfast   docusate sodium   100 mg Oral BID   empagliflozin   10  mg Oral QAC breakfast   escitalopram   10 mg Oral Daily   furosemide   40 mg Oral Daily   insulin  aspart  0-15 Units Subcutaneous TID WC   lisinopril   20 mg Oral QPM   pantoprazole   40 mg Oral Daily   polyethylene glycol  17 g Oral Daily   spironolactone   25 mg Oral Daily   Warfarin - Pharmacist Dosing Inpatient   Does not apply q1600   Infusions:   heparin  2,050 Units/hr (03/23/24 9375)   PRN:  Anti-infectives (From admission, onward)    None       Assessment: Patient is a 44 yo M presenting with right lower back pain radiating around to right groin. PMH significant for hypertension, paroxysmal a fib on Coumadin , history of CHF, history of ascending aortic aneurysm with surgical repair, and GERD. CT findings are concerning for a  suspected renal embolism. Patient is on Warfarin at home for Afib.  Pharmacy has been consulted for Warfarin and Heparin  bridging due to low INR. Patient is taking Warfarin 3 mg on TTS and 2 mg on all other days PTA. Patient's last Warfarin dose (2 mg) was last night on 03/18/24.   HL 0.26> 0.4> 0.37> 0.29, just slightly below  therapeutic. No issues with infusion INR 1.7> 1.8>2.2, therapeutic.   Goal of Therapy:  INR 2-3 Anti-Xa 0.3-0.7 Monitor platelets by anticoagulation protocol: Yes    Plan:  Warfarin 5 mg x 1 dose Increase heparin  infusion to 2150 units/hr x 24 hours, then d/c Heparin  level daily INR daily Continue to monitor H&H and platelets  Ardie Mclennan, BS Pharm D, BCPS Clinical Pharmacist 03/23/2024 8:33 AM           [1] No Known Allergies

## 2024-03-23 NOTE — Progress Notes (Signed)
 " PROGRESS NOTE    Chase Lee  FMW:[id number] DOB: [birth year] DOA: 03/18/2024  PCP: [facility]   Chief Complaint  Patient presents with   Flank Pain    Brief Narrative:  As per H&P.  By Dr. Manfred on 03/18/2024  Chase Lee is a 44 y.o. 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  Pain progressed through the night and was rated 10/10 on pain scale.  This was associated with nausea or vomiting which started this morning.   ED course In the emergency department, he was hemodynamically stable.  BP was 153/94.  Workup in ED showed normal CBC and BMP except for blood glucose of 167, creatinine 1.25, AST 88, ALT 57.  Urinalysis was positive for glycosuria. CT abdomen and pelvis with contrast showed loss of perfusion to the upper pole of the right kidney concerning for renal infarct. She was treated with Dilaudid , morphine  and Zofran .  Heparin  drip was started.  TRH was asked to admit patient. Dr. Norine with renal was consulted and recommended admitting patient for embolic workup.  Urology involvement was suggested.  Assessment & Plan: 1-acute renal infarction - CT abdomen and pelvis demonstrated loss of perfusion to the upper pole of the right kidney concerning for renal infarct. - At time of admission patient warfarin level was subtherapeutic most likely increasing the chances for thrombi - Continue IV heparin  drip and the use of  Coumadin  (dosed per pharmacy) - Maintain adequate hydration - Continue analgesics as needed. - Continue to follow clinical response.  2-subtherapeutic INR/acquired thrombophilia - We will continue management with heparin  and Coumadin  - Follow INR levels for stability; goal is for INR between 2 and 3. - Continue to follow assistance and recommendation on Coumadin  dosage by pharmacy service. - After Coumadin  7.5 mg x 1 dose on 03/22/2024 INR up to 2.2. - Planning for 24 hours overlapping prior to discharge and adjusted dose of Coumadin  and outpatient follow-up.  3-chronic diastolic heart failure - Appears to be stable and compensated - Will continue the use of Lasix , Aldactone , lisinopril  and Jardiance  - Continue to follow daily weights/strict intake and output and low-sodium diet - Recent echo in November 2025 demonstrating preserved ejection fraction and grade 2 diastolic dysfunction.  4-GERD - Continue PPI.  5-type 2 diabetes mellitus - A1c 6.6 - Follow CBG fluctuation and adjust hypoglycemic regimen as needed.  6-essential hypertension - Continue current antihypertensive agents - Follow vital signs.  7-paroxysmal atrial fibrillation - Continue management with anticoagulation therapy. - Rate control and currently sinus. - Patient is currently on heparin  drip while achieving therapeutic INR levels.  8-GAD/depression - No suicidal ideation or hallucination - Continue current anxiolytic and antidepressant management.  9-obesity class II -Body mass index is 36.64 kg/m.  -Low-calorie diet and portion control discussed with patient.  10-constipation/obstipation - Continue the use of MiraLAX  and Colace - Patient advised to maintain adequate hydration and increase physical activity. -As needed Dulcolax suppository has been added. - As needed simethicone  has been added to  patient's medication regimen.    DVT prophylaxis: IV heparin . Code Status: Full code. Family  Communication: No family at bedside. Disposition:   Status is: Inpatient Remains inpatient appropriate because: Continue IV heparin .   Consultants:  Nephrology service.  Procedures:  See below for x-ray reports.  Antimicrobials:  None   Subjective: No chest pain, no nausea, no vomiting.  Reporting no significant abdominal discomfort.  Patient has moved his bowels.  Objective: Vitals:   03/22/24 1359 03/22/24 1923 03/22/24 2029 03/23/24 0509  BP: 105/67 114/72 115/75 (!) 99/57  Pulse: 71 81 79 71  Resp:      Temp: 98.4 F (36.9 C) (!) 100.4 F (38 C) 99.8 F (37.7 C) 99.3 F (37.4 C)  TempSrc: Oral Oral Oral Oral  SpO2: 96% 100% 100% 95%  Weight:      Height:        Intake/Output Summary (Last 24 hours) at 03/23/2024 1100 Last data filed at 03/23/2024 0837 Gross per 24 hour  Intake 1564.51 ml  Output --  Net 1564.51 ml   Filed Weights   03/18/24 1112 03/18/24 2038  Weight: 103.4 kg 103 kg    Examination: General exam: Alert, awake, oriented x 3; in no acute distress. Respiratory system: Clear to auscultation. Respiratory effort normal. Cardiovascular system:RRR. No murmurs, rubs, gallops. Gastrointestinal system: Abdomen is obese, nondistended, soft and no guarding; positive bowel sounds. Central nervous system: Alert and oriented. No focal neurological deficits. Extremities: No C/C/E, +pedal pulses Skin: No rashes, lesions or ulcers Psychiatry: Judgement and insight appear normal. Mood & affect appropriate.   Data Reviewed: I have personally reviewed following labs and imaging studies  CBC: Recent Labs  Lab 03/18/24 1202 03/19/24 0416 03/20/24 0424 03/21/24 0420 03/22/24 0446 03/23/24 0525  WBC 9.8 9.7 11.4* 12.6* 11.6* 11.8*  NEUTROABS 8.3*  --   --   --   --   --   HGB 13.3 13.0 14.6 12.7* 12.1* 12.1*  HCT 41.4 40.6 46.2 39.9 37.7* 38.1*  MCV 81.7 84.2 83.8 83.5 83.2 83.9  PLT 254 204 222 208 200 223    Basic Metabolic Panel: Recent Labs   Lab 03/18/24 1202 03/19/24 0416 03/21/24 0420  NA 135 135 133*  K 4.5 4.2 4.4  CL 98 98 94*  CO2 23 21* 32  GLUCOSE 167* 160* 130*  BUN 17 15 17   CREATININE 1.25* 1.09 1.51*  CALCIUM  9.2 8.9 9.0  MG  --  2.3  --   PHOS  --  3.0  --     GFR: Estimated Creatinine Clearance: 69.5 mL/min (A) (by C-G formula based on SCr of 1.51 mg/dL (H)).  Liver Function Tests: Recent Labs  Lab 03/18/24 1202 03/19/24 0416  AST 88* 100*  ALT 57* 65*  ALKPHOS 89 107  BILITOT 0.4 0.6  PROT 7.4 7.2  ALBUMIN  4.7 4.6    CBG: Recent Labs  Lab 03/22/24 0741 03/22/24 1126 03/22/24 1632 03/22/24 2034 03/23/24 0733  GLUCAP 150* 162* 109* 161* 178*   Radiology Studies: No results found.   Scheduled Meds:  ascorbic acid   500 mg Oral Q breakfast   docusate sodium   100 mg Oral BID   empagliflozin   10 mg Oral QAC breakfast   escitalopram   10 mg Oral Daily   furosemide   40 mg Oral Daily   insulin  aspart  0-15 Units Subcutaneous TID WC   lisinopril   20 mg Oral QPM   pantoprazole   40 mg Oral Daily   polyethylene glycol  17 g Oral Daily   spironolactone   25 mg Oral Daily   warfarin  5 mg Oral ONCE-1600   Warfarin - Pharmacist Dosing Inpatient   Does not apply q1600   Continuous Infusions:  heparin  2,150 Units/hr (03/23/24 0909)     LOS: 5 days    Time spent: 50 minutes  Eric Nunnery, MD [facility]   To contact the attending provider between 7A-7P or the covering provider during after hours 7P-7A, please log into the web site [email or web address] and access using universal [place] password for that web site. If you do not have the password, please call the hospital operator.  03/23/2024, 11:00 AM    "

## 2024-03-24 ENCOUNTER — Ambulatory Visit: Payer: Self-pay

## 2024-03-24 DIAGNOSIS — R791 Abnormal coagulation profile: Secondary | ICD-10-CM

## 2024-03-24 LAB — CBC
HCT: 37.5 % — ABNORMAL LOW (ref 39.0–52.0)
Hemoglobin: 11.9 g/dL — ABNORMAL LOW (ref 13.0–17.0)
MCH: 26.7 pg (ref 26.0–34.0)
MCHC: 31.7 g/dL (ref 30.0–36.0)
MCV: 84.3 fL (ref 80.0–100.0)
Platelets: 214 K/uL (ref 150–400)
RBC: 4.45 MIL/uL (ref 4.22–5.81)
RDW: 18.3 % — ABNORMAL HIGH (ref 11.5–15.5)
WBC: 12 K/uL — ABNORMAL HIGH (ref 4.0–10.5)
nRBC: 0 % (ref 0.0–0.2)

## 2024-03-24 LAB — GLUCOSE, CAPILLARY
Glucose-Capillary: 167 mg/dL — ABNORMAL HIGH (ref 70–99)
Glucose-Capillary: 146 mg/dL — ABNORMAL HIGH (ref 70–99)

## 2024-03-24 LAB — HEPARIN LEVEL (UNFRACTIONATED): Heparin Unfractionated: 0.35 [IU]/mL (ref 0.30–0.70)

## 2024-03-24 LAB — PROTIME-INR
INR: 2.5 — ABNORMAL HIGH (ref 0.8–1.2)
Prothrombin Time: 28.4 s — ABNORMAL HIGH (ref 11.4–15.2)

## 2024-03-24 MED ORDER — DOCUSATE SODIUM 100 MG PO CAPS: 100.0000 mg | ORAL_CAPSULE | Freq: Two times a day (BID) | ORAL | 0 refills | Status: DC

## 2024-03-24 MED ORDER — WARFARIN SODIUM 4 MG PO TABS: 4.0000 mg | ORAL_TABLET | Freq: Once | ORAL | 2 refills | Status: DC

## 2024-03-24 MED ORDER — WARFARIN SODIUM 4 MG PO TABS: 4.0000 mg | ORAL_TABLET | Freq: Every day | ORAL | 2 refills | Status: AC

## 2024-03-24 MED ORDER — POLYETHYLENE GLYCOL 3350 17 G PO PACK: 17.0000 g | PACK | Freq: Every day | ORAL | 0 refills | Status: AC

## 2024-03-24 MED ORDER — WARFARIN SODIUM 5 MG PO TABS: 5.0000 mg | ORAL_TABLET | Freq: Once | ORAL | Status: DC

## 2024-03-24 MED ORDER — WARFARIN SODIUM 2 MG PO TABS
4.0000 mg | ORAL_TABLET | Freq: Once | ORAL | Status: AC
  Administered 2024-03-24: 4 mg via ORAL
  Filled 2024-03-24: qty 2

## 2024-03-24 MED ORDER — OXYCODONE HCL 5 MG PO TABS: 5.0000 mg | ORAL_TABLET | Freq: Four times a day (QID) | ORAL | 0 refills | Status: DC | PRN

## 2024-03-24 NOTE — Discharge Summary (Signed)
 " Physician Discharge Summary   Patient: Chase Lee MRN: [id number] DOB: 10/13/1980  Admit date:     03/18/2024  Discharge date: 03/24/2024  Discharge Physician: Eric Nunnery   PCP: [facility]   Recommendations at discharge:  Repeat CBC to follow hemoglobin trend/stability Repeat basic metabolic panel to follow electrolytes and renal function Reassess blood pressure and adjust medication as needed Continue to assist patient with weight loss management Follow patient CBGs/A1c and further adjust hypoglycemic regimen as required.  Discharge Diagnoses: Principal Problem:   Renal infarct Active Problems:   Subtherapeutic international normalized ratio (INR)  Brief Narrative:  As per H&P.  By Dr. Manfred on 03/18/2024  Chase Lee is a 44 y.o. 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  Pain progressed through the night and was rated 10/10 on pain scale.  This was associated with nausea or vomiting which started this morning.   ED course In the emergency department, he was hemodynamically stable.  BP was 153/94.  Workup in ED showed normal CBC and BMP except for blood glucose of 167, creatinine 1.25, AST 88, ALT 57.  Urinalysis was positive for glycosuria. CT abdomen and pelvis with contrast showed loss of perfusion to the upper pole of the right kidney concerning for renal infarct. She was treated with Dilaudid , morphine  and Zofran .   Heparin  drip was started.  TRH was asked to admit patient. Dr. Norine with renal was consulted and recommended admitting patient for embolic workup.  Urology involvement was suggested.  Assessment and Plan: 1-acute renal infarction - CT abdomen and pelvis demonstrated loss of perfusion to the upper pole of the right kidney concerning for renal infarct. - At time of admission patient warfarin level was subtherapeutic most likely increasing the chances for thrombi - Continue as needed analgesics while avoiding NSAIDs - Continue adjusted dose of Coumadin  at discharge - Maintain adequate hydration - Follow renal function trend.   2-subtherapeutic INR/acquired thrombophilia - We will continue management with heparin  and Coumadin  - Follow INR levels for stability; goal is for INR between 2 and 3. - Continue to follow assistance and recommendation on Coumadin  dosage by pharmacy service. - After Coumadin /heparin  bridging INR up to 2.5 at discharge -Patient has been discharged home Coumadin  4 mg on daily basis to maintain INR goal. - Continue patient follow-up with Coumadin  clinic.   3-chronic diastolic heart failure - Appears to be stable and compensated - Will continue the use of Lasix , Aldactone , lisinopril  and Jardiance  - Continue to follow daily weights/strict intake and output and low-sodium diet - Recent echo in November 2025 demonstrating preserved ejection fraction and grade 2 diastolic dysfunction.   4-GERD - Continue PPI.   5-type 2 diabetes mellitus - A1c 6.6 - Follow CBG fluctuation and adjust hypoglycemic regimen as needed.   6-essential hypertension - Continue current antihypertensive agents - Follow vital signs.   7-paroxysmal atrial fibrillation - Continue management with anticoagulation therapy. - Rate control and currently sinus. - Patient is currently on heparin  drip while achieving  therapeutic INR levels.   8-GAD/depression - No suicidal ideation or  hallucination - Continue current anxiolytic and antidepressant management.   9-obesity class II -Body mass index is 36.64 kg/m.  -Low-calorie diet and portion control discussed with patient.   10-constipation/obstipation - Continue the use of MiraLAX  and Colace - Patient advised to maintain adequate hydration and increase physical activity.   Consultants: None Procedures performed: See below for x-ray reports. Disposition: Home Diet recommendation: Heart healthy/modified carbohydrate and low calorie diet.  DISCHARGE MEDICATION: Allergies as of 03/24/2024   No Known Allergies      Medication List     TAKE these medications    ALPRAZolam  1 MG tablet Commonly known as: XANAX  TAKE 1 TABLET(1 MG) BY MOUTH THREE TIMES DAILY AS NEEDED FOR ANXIETY What changed:  how much to take how to take this when to take this   ascorbic acid  500 MG tablet Commonly known as: VITAMIN C  Take 1 tablet (500 mg total) by mouth daily with breakfast.   Buprenorphine  HCl-Naloxone  HCl 8-2 MG Film Place 1 Film under the tongue 3 (three) times daily.   cyclobenzaprine  5 MG tablet Commonly known as: FLEXERIL  Take 5 mg by mouth at bedtime as needed.   docusate sodium  100 MG capsule Commonly known as: COLACE Take 1 capsule (100 mg total) by mouth 2 (two) times daily.   empagliflozin  10 MG Tabs tablet Commonly known as: Jardiance  Take 1 tablet (10 mg total) by mouth daily before breakfast.   escitalopram  10 MG tablet Commonly known as: LEXAPRO  TAKE 1 TABLET(10 MG) BY MOUTH DAILY   furosemide  40 MG tablet Commonly known as: LASIX  Take 40 mg (1 tablet) daily alternating with 80 mg (2 tablets) daily   lisinopril  20 MG tablet Commonly known as: ZESTRIL  Take 20 mg by mouth daily.   omeprazole  40 MG capsule Commonly known as: PRILOSEC Take 1 capsule (40 mg total) by mouth 2 (two) times daily.   oxyCODONE  5 MG immediate release tablet Commonly known as: Roxicodone  Take 1 tablet (5 mg  total) by mouth every 6 (six) hours as needed for severe pain (pain score 7-10) or breakthrough pain.   polyethylene glycol 17 g packet Commonly known as: MIRALAX  / GLYCOLAX  Take 17 g by mouth daily. Start taking on: March 25, 2024   spironolactone  25 MG tablet Commonly known as: ALDACTONE  Take 1 tablet (25 mg total) by mouth daily.   warfarin 4 MG tablet Commonly known as: COUMADIN  Take as directed. If you are unsure how to take this medication, talk to your nurse or doctor. Original instructions: Take 1 tablet (4 mg total) by mouth one time only at 4 PM. What changed:  medication strength how much to take when to take this        Follow-up Information     [facility] .   Specialty: Emergency Medicine Why: as discussed,  we strongly encourage you to be admitted to the hospital today. Contact information: [address] [place]  [phone]        [facility] Schedule an appointment as soon as possible for a visit in 10 day(s).   Contact information: [address] [place] [address] [phone]                Discharge Exam: Fredricka Weights   03/18/24 1112 03/18/24 2038  Weight: 103.4 kg 103 kg    General exam: Alert, awake, oriented x  3; in no acute distress. Respiratory system: Clear to auscultation. Respiratory effort normal. Cardiovascular system:RRR. No murmurs, rubs, gallops. Gastrointestinal system: Abdomen is obese, nondistended, soft and no guarding; positive bowel sounds. Central nervous system: Alert and oriented. No focal neurological deficits. Extremities: No C/C/E, +pedal pulses Skin: No rashes, lesions or ulcers Psychiatry: Judgement and insight appear normal. Mood & affect appropriate.   Condition at discharge: Stable and improved.  The results of significant diagnostics from this hospitalization (including imaging, microbiology,  ancillary and laboratory) are listed below for reference.   Imaging Studies: CT ABDOMEN PELVIS W CONTRAST Result Date: 03/18/2024 EXAM: CT ABDOMEN AND PELVIS WITH CONTRAST 03/18/2024 03:01:49 PM TECHNIQUE: CT of the abdomen and pelvis was performed with the administration of 100 mL of iohexol  (OMNIPAQUE ) 300 MG/ML solution. Multiplanar reformatted images are provided for review. Automated exposure control, iterative reconstruction, and/or weight-based adjustment of the mA/kV was utilized to reduce the radiation dose to as low as reasonably achievable. COMPARISON: 06/18/2023 CLINICAL HISTORY: RLQ abdominal pain; right lateral and right lower abdominal pain. FINDINGS: LOWER CHEST: No acute abnormality. LIVER: Diffuse low density throughout the liver is compatible with fatty infiltration. GALLBLADDER AND BILE DUCTS: Gallbladder is unremarkable. No biliary ductal dilatation. SPLEEN: No acute abnormality. PANCREAS: No acute abnormality. ADRENAL GLANDS: No acute abnormality. KIDNEYS, URETERS AND BLADDER: Loss of perfusion to the upper pole of the right kidney concerning for renal infarct. No stones in the kidneys or ureters. No hydronephrosis. No perinephric or periureteral stranding. Urinary bladder is unremarkable. GI AND BOWEL: Stomach demonstrates no acute abnormality. There is no bowel obstruction. Normal appendix. PERITONEUM AND RETROPERITONEUM: No ascites. No free air. VASCULATURE: Aorta is normal in caliber. LYMPH NODES: No lymphadenopathy. REPRODUCTIVE ORGANS: No acute abnormality. BONES AND SOFT TISSUES: No acute osseous abnormality. No focal soft tissue abnormality. IMPRESSION: 1. Loss of perfusion to the upper pole of the right kidney concerning for renal infarct. 2. Fatty liver. Electronically signed by: Franky Crease MD 03/18/2024 03:46 PM EST RP Workstation: [id number]    Microbiology: Results for orders placed or performed during the hospital encounter of 01/26/22  Resp Panel by RT-PCR (Flu A&B,  Covid) Anterior Nasal Swab     Status: None   Collection Time: 01/26/22  4:17 PM   Specimen: Anterior Nasal Swab  Result Value Ref Range Status   SARS Coronavirus 2 by RT PCR NEGATIVE NEGATIVE Final    Comment: (NOTE) SARS-CoV-2 target nucleic acids are NOT DETECTED.  The SARS-CoV-2 RNA is generally detectable in upper respiratory specimens during the acute phase of infection. The lowest concentration of SARS-CoV-2 viral copies this assay can detect is 138 copies/mL. A negative result does not preclude SARS-Cov-2 infection and should not be used as the sole basis for treatment or other patient management decisions. A negative result may occur with  improper specimen collection/handling, submission of specimen other than nasopharyngeal swab, presence of viral mutation(s) within the areas targeted by this assay, and inadequate number of viral copies(<138 copies/mL). A negative result must be combined with clinical observations, patient history, and epidemiological information. The expected result is Negative.  Fact Sheet for Patients:  bloggercourse.com  Fact Sheet for Healthcare Providers:  seriousbroker.it  This test is no t yet approved or cleared by the [place]  FDA and  has been authorized for detection and/or diagnosis of SARS-CoV-2 by FDA under an Emergency Use Authorization (EUA). This EUA will remain  in effect (meaning this test can be used) for the duration of the COVID-19 declaration under Section  564(b)(1) of the Act, 21 U.S.C.section 360bbb-3(b)(1), unless the authorization is terminated  or revoked sooner.       Influenza A by PCR NEGATIVE NEGATIVE Final   Influenza B by PCR NEGATIVE NEGATIVE Final    Comment: (NOTE) The Xpert Xpress SARS-CoV-2/FLU/RSV plus assay is intended as an aid in the diagnosis of influenza from Nasopharyngeal swab specimens and should not be used as a sole basis for treatment. Nasal  washings and aspirates are unacceptable for Xpert Xpress SARS-CoV-2/FLU/RSV testing.  Fact Sheet for Patients: bloggercourse.com  Fact Sheet for Healthcare Providers: seriousbroker.it  This test is not yet approved or cleared by the [place]  FDA and has been authorized for detection and/or diagnosis of SARS-CoV-2 by FDA under an Emergency Use Authorization (EUA). This EUA will remain in effect (meaning this test can be used) for the duration of the COVID-19 declaration under Section 564(b)(1) of the Act, 21 U.S.C. section 360bbb-3(b)(1), unless the authorization is terminated or revoked.  Performed at [facility], [address] [place], [place] [address]     Labs: CBC: Recent Labs  Lab 03/18/24 1202 03/19/24 0416 03/20/24 0424 03/21/24 0420 03/22/24 0446 03/23/24 0525 03/24/24 0516  WBC 9.8   < > 11.4* 12.6* 11.6* 11.8* 12.0*  NEUTROABS 8.3*  --   --   --   --   --   --   HGB 13.3   < > 14.6 12.7* 12.1* 12.1* 11.9*  HCT 41.4   < > 46.2 39.9 37.7* 38.1* 37.5*  MCV 81.7   < > 83.8 83.5 83.2 83.9 84.3  PLT 254   < > 222 208 200 223 214   < > = values in this interval not displayed.   Basic Metabolic Panel: Recent Labs  Lab 03/18/24 1202 03/19/24 0416 03/21/24 0420  NA 135 135 133*  K 4.5 4.2 4.4  CL 98 98 94*  CO2 23 21* 32  GLUCOSE 167* 160* 130*  BUN 17 15 17   CREATININE 1.25* 1.09 1.51*  CALCIUM  9.2 8.9 9.0  MG  --  2.3  --   PHOS  --  3.0  --    Liver Function Tests: Recent Labs  Lab 03/18/24 1202 03/19/24 0416  AST 88* 100*  ALT 57* 65*  ALKPHOS 89 107  BILITOT 0.4 0.6  PROT 7.4 7.2  ALBUMIN  4.7 4.6   CBG: Recent Labs  Lab 03/23/24 1103 03/23/24 1603 03/23/24 2048 03/24/24 0713 03/24/24 1053  GLUCAP 197* 171* 172* 167* 146*    Discharge time spent:  35 minutes.  Signed: Eric Nunnery, MD [facility] 03/24/2024 "

## 2024-03-24 NOTE — Progress Notes (Addendum)
 PHARMACY - ANTICOAGULATION CONSULT NOTE  Pharmacy Consult for Heparin  & Warfarin Indication: suspected renal embolism/aortic valve  Allergies[1]  Patient Measurements: Height: 5' 6 (167.6 cm) Weight: 103 kg (227 lb) IBW/kg (Calculated) : 63.8 HEPARIN  DW (KG): 86.7  Vital Signs: Temp: 98.1 F (36.7 C) (01/05 0445) Temp Source: Oral (01/05 0445) BP: 121/80 (01/05 0538) Pulse Rate: 68 (01/05 0445)  Labs: Recent Labs    03/22/24 0446 03/22/24 1302 03/23/24 0525 03/24/24 0516  HGB 12.1*  --  12.1* 11.9*  HCT 37.7*  --  38.1* 37.5*  PLT 200  --  223 214  LABPROT 21.9*  --  25.5* 28.4*  INR 1.8*  --  2.2* 2.5*  HEPARINUNFRC 0.40 0.37 0.29* 0.35    Estimated Creatinine Clearance: 69.5 mL/min (A) (by C-G formula based on SCr of 1.51 mg/dL (H)).   Medical History: Past Medical History:  Diagnosis Date   Anxiety    History of artificial heart valve    a. s/p aortic valvuloplasty at age 10 b. subsequent mechanical AVR at 44 yo c. 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   HTN (hypertension) 04/05/2023    Medications:  Medications Prior to Admission  Medication Sig Dispense Refill Last Dose/Taking   ALPRAZolam  (XANAX ) 1 MG tablet TAKE 1 TABLET(1 MG) BY MOUTH THREE TIMES DAILY AS NEEDED FOR ANXIETY (Patient taking differently: Take 1 mg by mouth 3 (three) times daily. TAKE 1 TABLET(1 MG) BY MOUTH THREE TIMES DAILY AS NEEDED FOR ANXIETY) 90 tablet 2 Taking Differently   Buprenorphine  HCl-Naloxone  HCl 8-2 MG FILM Place 1 Film under the tongue 3 (three) times daily.   03/17/2024 Bedtime   cyclobenzaprine  (FLEXERIL ) 5 MG tablet Take 5 mg by mouth at bedtime as needed.   Unknown   empagliflozin  (JARDIANCE ) 10 MG TABS tablet Take 1 tablet (10 mg total) by mouth daily before breakfast. 90 tablet 3 03/17/2024   escitalopram  (LEXAPRO ) 10 MG tablet TAKE 1 TABLET(10 MG) BY MOUTH DAILY 90 tablet 0  03/17/2024   furosemide  (LASIX ) 40 MG tablet Take 40 mg (1 tablet) daily alternating with 80 mg (2 tablets) daily 405 tablet 3 03/17/2024   lisinopril  (ZESTRIL ) 20 MG tablet Take 20 mg by mouth daily.   03/17/2024   omeprazole  (PRILOSEC) 40 MG capsule Take 1 capsule (40 mg total) by mouth 2 (two) times daily. 60 capsule 1 03/17/2024   warfarin (COUMADIN ) 2 MG tablet Take 1 tablet (2 mg total) by mouth daily. (Patient taking differently: Take 2-3 mg by mouth See admin instructions. Take 3mg  by mouth every Tuesday, Thursday, Saturday. Take 2mg  by mouth all other days.) 30 tablet 5 03/17/2024 at 10:30 PM   ascorbic acid  (VITAMIN C ) 500 MG tablet Take 1 tablet (500 mg total) by mouth daily with breakfast. (Patient not taking: Reported on 03/18/2024) 30 tablet 1 Not Taking   spironolactone  (ALDACTONE ) 25 MG tablet Take 1 tablet (25 mg total) by mouth daily. (Patient not taking: Reported on 03/18/2024) 90 tablet 3 Not Taking   Scheduled:   ascorbic acid   500 mg Oral Q breakfast   docusate sodium   100 mg Oral BID   empagliflozin   10 mg Oral QAC breakfast   escitalopram   10 mg Oral Daily   furosemide   40 mg Oral Daily   insulin  aspart  0-15 Units Subcutaneous TID WC   lisinopril   20 mg Oral QPM   pantoprazole   40 mg Oral Daily  polyethylene glycol  17 g Oral Daily   spironolactone   25 mg Oral Daily   Warfarin - Pharmacist Dosing Inpatient   Does not apply q1600   Infusions:    PRN:  Anti-infectives (From admission, onward)    None       Assessment: Patient is a 44 yo M presenting with right lower back pain radiating around to right groin. PMH significant for hypertension, paroxysmal a fib on Coumadin , history of CHF, history of ascending aortic aneurysm with surgical repair, and GERD. CT findings are concerning for a suspected renal embolism. Patient is on Warfarin at home for Afib.  Pharmacy has been consulted for Warfarin and Heparin  bridging due to low INR. Patient is taking Warfarin 3  mg on TTS and 2 mg on all other days PTA. Patient's last Warfarin dose (2 mg) was last night on 03/18/24.   HL 0.32, therapeutic. D/C , INR remains therapeutic INR 1.7> 1.8>2.2> 2.5 , therapeutic.   Goal of Therapy:  INR 2-3 Anti-Xa 0.3-0.7 Monitor platelets by anticoagulation protocol: Yes    Plan:  Warfarin  4mg  x 1 dose D/C heparin  INR daily Continue to monitor H&H and platelets  Bryn Perkin, BS Pharm D, BCPS Clinical Pharmacist 03/24/2024 8:56 AM            [1] No Known Allergies

## 2024-03-28 ENCOUNTER — Other Ambulatory Visit: Payer: Self-pay | Admitting: Surgery

## 2024-03-28 DIAGNOSIS — I7121 Aneurysm of the ascending aorta, without rupture: Secondary | ICD-10-CM

## 2024-04-02 ENCOUNTER — Ambulatory Visit: Attending: Internal Medicine | Admitting: *Deleted

## 2024-04-02 DIAGNOSIS — N280 Ischemia and infarction of kidney: Secondary | ICD-10-CM

## 2024-04-02 DIAGNOSIS — Z952 Presence of prosthetic heart valve: Secondary | ICD-10-CM

## 2024-04-02 DIAGNOSIS — Z5181 Encounter for therapeutic drug level monitoring: Secondary | ICD-10-CM | POA: Diagnosis not present

## 2024-04-02 LAB — POCT INR: INR: 3 (ref 2.0–3.0)

## 2024-04-02 NOTE — Progress Notes (Signed)
 INR 3.0; Please see anticoagulation encounter

## 2024-04-02 NOTE — Patient Instructions (Signed)
 Started on warfarin 4mg  tablet at hospital discharge. Continue warfarin 1 tablet daily  Recheck in 2 wks

## 2024-04-09 ENCOUNTER — Ambulatory Visit: Attending: Internal Medicine | Admitting: *Deleted

## 2024-04-09 DIAGNOSIS — Z952 Presence of prosthetic heart valve: Secondary | ICD-10-CM | POA: Diagnosis not present

## 2024-04-09 DIAGNOSIS — Z5181 Encounter for therapeutic drug level monitoring: Secondary | ICD-10-CM | POA: Diagnosis not present

## 2024-04-09 LAB — POCT INR: INR: 3.3 — AB (ref 2.0–3.0)

## 2024-04-09 NOTE — Patient Instructions (Signed)
 Started on warfarin 4mg  tablet at hospital discharge. Decrease warfarin to 1 tablet daily except 1/2 on Wednesdays Recheck in 2 wks

## 2024-04-09 NOTE — Progress Notes (Signed)
INR 3.3

## 2024-04-21 ENCOUNTER — Ambulatory Visit (HOSPITAL_COMMUNITY): Admission: RE | Admit: 2024-04-21 | Source: Ambulatory Visit | Attending: Surgery | Admitting: Surgery

## 2024-04-23 ENCOUNTER — Ambulatory Visit

## 2024-04-24 ENCOUNTER — Ambulatory Visit (HOSPITAL_COMMUNITY): Admission: RE | Admit: 2024-04-24 | Discharge: 2024-04-24 | Attending: Surgery | Admitting: Surgery

## 2024-04-24 DIAGNOSIS — I7121 Aneurysm of the ascending aorta, without rupture: Secondary | ICD-10-CM

## 2024-04-24 MED ORDER — IOHEXOL 350 MG/ML SOLN
75.0000 mL | Freq: Once | INTRAVENOUS | Status: AC | PRN
  Administered 2024-04-24: 75 mL via INTRAVENOUS

## 2024-04-25 ENCOUNTER — Ambulatory Visit (HOSPITAL_COMMUNITY)

## 2024-04-28 ENCOUNTER — Ambulatory Visit: Admitting: Surgery

## 2024-04-29 ENCOUNTER — Ambulatory Visit

## 2024-04-30 ENCOUNTER — Ambulatory Visit: Admitting: Surgery

## 2024-05-14 ENCOUNTER — Ambulatory Visit: Admitting: Surgery
